# Patient Record
Sex: Female | Born: 1955 | Race: Black or African American | Hispanic: No | Marital: Single | Smoking: Never smoker
Health system: Southern US, Community
[De-identification: ages and names within clinical notes are randomized; demographics above are authoritative.]

## PROBLEM LIST (undated history)

## (undated) DIAGNOSIS — N179 Acute kidney failure, unspecified: Secondary | ICD-10-CM

## (undated) DIAGNOSIS — K2270 Barrett's esophagus without dysplasia: Secondary | ICD-10-CM

## (undated) DIAGNOSIS — E559 Vitamin D deficiency, unspecified: Secondary | ICD-10-CM

## (undated) DIAGNOSIS — K219 Gastro-esophageal reflux disease without esophagitis: Secondary | ICD-10-CM

## (undated) DIAGNOSIS — N200 Calculus of kidney: Secondary | ICD-10-CM

## (undated) DIAGNOSIS — B351 Tinea unguium: Secondary | ICD-10-CM

## (undated) DIAGNOSIS — R7303 Prediabetes: Secondary | ICD-10-CM

## (undated) DIAGNOSIS — G4733 Obstructive sleep apnea (adult) (pediatric): Secondary | ICD-10-CM

## (undated) DIAGNOSIS — K5900 Constipation, unspecified: Secondary | ICD-10-CM

## (undated) DIAGNOSIS — Z972 Presence of dental prosthetic device (complete) (partial): Secondary | ICD-10-CM

## (undated) DIAGNOSIS — Z87442 Personal history of urinary calculi: Secondary | ICD-10-CM

## (undated) DIAGNOSIS — E785 Hyperlipidemia, unspecified: Secondary | ICD-10-CM

## (undated) DIAGNOSIS — K08109 Complete loss of teeth, unspecified cause, unspecified class: Secondary | ICD-10-CM

## (undated) DIAGNOSIS — H409 Unspecified glaucoma: Secondary | ICD-10-CM

## (undated) DIAGNOSIS — M1990 Unspecified osteoarthritis, unspecified site: Secondary | ICD-10-CM

## (undated) DIAGNOSIS — M48061 Spinal stenosis, lumbar region without neurogenic claudication: Secondary | ICD-10-CM

## (undated) DIAGNOSIS — I499 Cardiac arrhythmia, unspecified: Secondary | ICD-10-CM

## (undated) DIAGNOSIS — K760 Fatty (change of) liver, not elsewhere classified: Secondary | ICD-10-CM

## (undated) DIAGNOSIS — F0150 Vascular dementia without behavioral disturbance: Secondary | ICD-10-CM

## (undated) DIAGNOSIS — E039 Hypothyroidism, unspecified: Secondary | ICD-10-CM

## (undated) DIAGNOSIS — F800 Phonological disorder: Secondary | ICD-10-CM

## (undated) DIAGNOSIS — F0280 Dementia in other diseases classified elsewhere without behavioral disturbance: Secondary | ICD-10-CM

## (undated) DIAGNOSIS — R001 Bradycardia, unspecified: Secondary | ICD-10-CM

## (undated) DIAGNOSIS — J45909 Unspecified asthma, uncomplicated: Secondary | ICD-10-CM

## (undated) DIAGNOSIS — J189 Pneumonia, unspecified organism: Secondary | ICD-10-CM

## (undated) DIAGNOSIS — M5030 Other cervical disc degeneration, unspecified cervical region: Secondary | ICD-10-CM

## (undated) DIAGNOSIS — F0390 Unspecified dementia without behavioral disturbance: Secondary | ICD-10-CM

## (undated) DIAGNOSIS — I5030 Unspecified diastolic (congestive) heart failure: Secondary | ICD-10-CM

## (undated) DIAGNOSIS — D696 Thrombocytopenia, unspecified: Secondary | ICD-10-CM

## (undated) DIAGNOSIS — J9690 Respiratory failure, unspecified, unspecified whether with hypoxia or hypercapnia: Secondary | ICD-10-CM

## (undated) DIAGNOSIS — Q909 Down syndrome, unspecified: Secondary | ICD-10-CM

## (undated) DIAGNOSIS — R569 Unspecified convulsions: Secondary | ICD-10-CM

## (undated) HISTORY — DX: Tinea unguium: B35.1

## (undated) HISTORY — PX: HERNIA REPAIR: SHX51

## (undated) HISTORY — DX: Unspecified asthma, uncomplicated: J45.909

---

## 2005-01-23 ENCOUNTER — Ambulatory Visit: Payer: Self-pay | Admitting: Family Medicine

## 2005-03-20 ENCOUNTER — Inpatient Hospital Stay: Payer: Self-pay

## 2005-07-03 ENCOUNTER — Inpatient Hospital Stay: Payer: Self-pay | Admitting: Internal Medicine

## 2006-02-20 ENCOUNTER — Ambulatory Visit: Payer: Self-pay | Admitting: Family Medicine

## 2006-08-22 ENCOUNTER — Other Ambulatory Visit: Payer: Self-pay

## 2006-08-22 ENCOUNTER — Ambulatory Visit: Payer: Self-pay | Admitting: General Surgery

## 2006-08-27 ENCOUNTER — Ambulatory Visit: Payer: Self-pay | Admitting: General Surgery

## 2007-03-12 ENCOUNTER — Ambulatory Visit: Payer: Self-pay | Admitting: Family Medicine

## 2008-02-19 ENCOUNTER — Inpatient Hospital Stay: Payer: Self-pay | Admitting: Internal Medicine

## 2008-05-06 ENCOUNTER — Ambulatory Visit: Payer: Self-pay | Admitting: Family Medicine

## 2009-05-11 ENCOUNTER — Inpatient Hospital Stay: Payer: Self-pay | Admitting: Internal Medicine

## 2009-05-27 ENCOUNTER — Ambulatory Visit: Payer: Self-pay | Admitting: Family Medicine

## 2009-06-10 ENCOUNTER — Ambulatory Visit: Payer: Self-pay | Admitting: Family

## 2009-07-14 ENCOUNTER — Ambulatory Visit: Payer: Self-pay | Admitting: Family

## 2009-10-13 ENCOUNTER — Ambulatory Visit: Payer: Self-pay | Admitting: Family

## 2010-01-27 ENCOUNTER — Ambulatory Visit: Payer: Self-pay | Admitting: Family

## 2010-04-28 ENCOUNTER — Ambulatory Visit: Payer: Self-pay | Admitting: Family

## 2010-06-22 ENCOUNTER — Ambulatory Visit: Payer: Self-pay | Admitting: Family Medicine

## 2011-04-26 ENCOUNTER — Ambulatory Visit: Payer: Self-pay | Admitting: Family Medicine

## 2011-06-07 ENCOUNTER — Ambulatory Visit: Payer: Self-pay | Admitting: Gastroenterology

## 2011-06-09 LAB — PATHOLOGY REPORT

## 2011-08-30 ENCOUNTER — Ambulatory Visit: Payer: Self-pay | Admitting: Family Medicine

## 2011-10-27 ENCOUNTER — Inpatient Hospital Stay: Payer: Self-pay | Admitting: Internal Medicine

## 2011-10-27 LAB — COMPREHENSIVE METABOLIC PANEL
Albumin: 3.2 g/dL — ABNORMAL LOW (ref 3.4–5.0)
Alkaline Phosphatase: 55 U/L (ref 50–136)
BUN: 10 mg/dL (ref 7–18)
Bilirubin,Total: 0.4 mg/dL (ref 0.2–1.0)
Creatinine: 0.93 mg/dL (ref 0.60–1.30)
Glucose: 100 mg/dL — ABNORMAL HIGH (ref 65–99)
Osmolality: 286 (ref 275–301)
SGPT (ALT): 16 U/L (ref 12–78)
Sodium: 144 mmol/L (ref 136–145)

## 2011-10-27 LAB — CBC
HCT: 36.5 % (ref 35.0–47.0)
MCH: 33.4 pg (ref 26.0–34.0)
MCV: 99 fL (ref 80–100)
Platelet: 210 10*3/uL (ref 150–440)
RDW: 16 % — ABNORMAL HIGH (ref 11.5–14.5)
WBC: 8.3 10*3/uL (ref 3.6–11.0)

## 2011-10-27 LAB — TROPONIN I: Troponin-I: <0.02 ng/mL

## 2011-10-27 LAB — CK TOTAL AND CKMB (NOT AT ARMC): CK-MB: <0.5 ng/mL — ABNORMAL LOW (ref 0.5–3.6)

## 2011-10-27 LAB — CK-MB: CK-MB: 0.9 ng/mL (ref 0.5–3.6)

## 2011-10-28 LAB — TSH: Thyroid Stimulating Horm: 0.759 u[IU]/mL

## 2011-10-28 LAB — BASIC METABOLIC PANEL
Anion Gap: 10 (ref 7–16)
BUN: 9 mg/dL (ref 7–18)
Calcium, Total: 8.7 mg/dL (ref 8.5–10.1)
Co2: 31 mmol/L (ref 21–32)
EGFR (African American): >60
EGFR (Non-African Amer.): >60
Glucose: 90 mg/dL (ref 65–99)
Potassium: 3.6 mmol/L (ref 3.5–5.1)

## 2011-10-28 LAB — CBC WITH DIFFERENTIAL/PLATELET
Basophil %: 1.4 %
Eosinophil #: 0 10*3/uL (ref 0.0–0.7)
HCT: 35.9 % (ref 35.0–47.0)
HGB: 11.9 g/dL — ABNORMAL LOW (ref 12.0–16.0)
Lymphocyte #: 1 10*3/uL (ref 1.0–3.6)
Lymphocyte %: 15.6 %
MCH: 33 pg (ref 26.0–34.0)
MCHC: 33.2 g/dL (ref 32.0–36.0)
MCV: 99 fL (ref 80–100)
Monocyte #: 0.3 x10 3/mm (ref 0.2–0.9)
Neutrophil %: 77.7 %
Platelet: 196 10*3/uL (ref 150–440)
RBC: 3.61 10*6/uL — ABNORMAL LOW (ref 3.80–5.20)
RDW: 15.8 % — ABNORMAL HIGH (ref 11.5–14.5)
WBC: 6.1 10*3/uL (ref 3.6–11.0)

## 2011-10-28 LAB — LIPID PANEL
Cholesterol: 152 mg/dL (ref 0–200)
HDL Cholesterol: 38 mg/dL — ABNORMAL LOW (ref 40–60)
Ldl Cholesterol, Calc: 98 mg/dL (ref 0–100)
Triglycerides: 81 mg/dL (ref 0–200)
VLDL Cholesterol, Calc: 16 mg/dL (ref 5–40)

## 2011-10-29 LAB — BASIC METABOLIC PANEL
Anion Gap: 7 (ref 7–16)
Calcium, Total: 8.7 mg/dL (ref 8.5–10.1)
Co2: 31 mmol/L (ref 21–32)
EGFR (African American): >60
EGFR (Non-African Amer.): >60
Glucose: 148 mg/dL — ABNORMAL HIGH (ref 65–99)
Osmolality: 283 (ref 275–301)
Sodium: 140 mmol/L (ref 136–145)

## 2011-10-29 LAB — CBC WITH DIFFERENTIAL/PLATELET
Basophil %: 0.4 %
Eosinophil %: 0 %
HGB: 12.2 g/dL (ref 12.0–16.0)
Lymphocyte #: 0.5 10*3/uL — ABNORMAL LOW (ref 1.0–3.6)
MCHC: 33.4 g/dL (ref 32.0–36.0)
MCV: 99 fL (ref 80–100)
Monocyte #: 0 x10 3/mm — ABNORMAL LOW (ref 0.2–0.9)
Monocyte %: 0.6 %
Platelet: 216 10*3/uL (ref 150–440)
RBC: 3.72 10*6/uL — ABNORMAL LOW (ref 3.80–5.20)
WBC: 6.7 10*3/uL (ref 3.6–11.0)

## 2011-10-29 LAB — TROPONIN I
Troponin-I: <0.02 ng/mL
Troponin-I: <0.02 ng/mL

## 2012-01-19 ENCOUNTER — Ambulatory Visit: Payer: Self-pay | Admitting: Family Medicine

## 2012-05-08 ENCOUNTER — Encounter: Payer: Self-pay | Admitting: Family Medicine

## 2012-05-08 ENCOUNTER — Ambulatory Visit: Payer: Self-pay | Admitting: Family Medicine

## 2012-05-16 ENCOUNTER — Encounter: Payer: Self-pay | Admitting: Family Medicine

## 2012-05-17 ENCOUNTER — Ambulatory Visit: Payer: Self-pay | Admitting: Family Medicine

## 2012-06-16 ENCOUNTER — Encounter: Payer: Self-pay | Admitting: Family Medicine

## 2012-10-05 ENCOUNTER — Encounter: Payer: Self-pay | Admitting: *Deleted

## 2012-10-05 DIAGNOSIS — B351 Tinea unguium: Secondary | ICD-10-CM | POA: Insufficient documentation

## 2012-10-16 ENCOUNTER — Ambulatory Visit: Payer: Self-pay | Admitting: Podiatry

## 2012-11-29 ENCOUNTER — Ambulatory Visit: Payer: Self-pay | Admitting: Family Medicine

## 2012-12-02 ENCOUNTER — Ambulatory Visit: Payer: Medicare Other | Admitting: Podiatry

## 2012-12-04 ENCOUNTER — Ambulatory Visit: Payer: Self-pay | Admitting: Family Medicine

## 2012-12-09 ENCOUNTER — Ambulatory Visit (INDEPENDENT_AMBULATORY_CARE_PROVIDER_SITE_OTHER): Payer: Medicare Other | Admitting: Podiatry

## 2012-12-09 ENCOUNTER — Encounter: Payer: Self-pay | Admitting: Podiatry

## 2012-12-09 VITALS — BP 126/71 | HR 55 | Resp 20 | Ht 63.0 in | Wt 211.0 lb

## 2012-12-09 DIAGNOSIS — M79609 Pain in unspecified limb: Secondary | ICD-10-CM

## 2012-12-09 DIAGNOSIS — B351 Tinea unguium: Secondary | ICD-10-CM

## 2012-12-09 NOTE — Progress Notes (Signed)
Lori Petersen presents today with a chief complaint of painful toenails one through 5 bilateral.  Objective: Pulses remain palpable bilateral lower extremity. Nails are thick yellow dystrophic clinically mycotic and darkened.  Assessment: Pain in limb secondary to onychomycosis 1 through 5 bilateral.  Plan: Debridement of nails 1 through 5 bilateral is cover service secondary to pain.

## 2013-02-22 ENCOUNTER — Observation Stay: Payer: Self-pay | Admitting: Internal Medicine

## 2013-02-22 LAB — CBC WITH DIFFERENTIAL/PLATELET
BASOS ABS: 0.1 10*3/uL (ref 0.0–0.1)
BASOS PCT: 1 %
Eosinophil #: 0.2 10*3/uL (ref 0.0–0.7)
Eosinophil %: 2.5 %
HCT: 40.3 % (ref 35.0–47.0)
HGB: 13.5 g/dL (ref 12.0–16.0)
Lymphocyte #: 1.3 10*3/uL (ref 1.0–3.6)
Lymphocyte %: 18.4 %
MCH: 34.6 pg — AB (ref 26.0–34.0)
MCHC: 33.6 g/dL (ref 32.0–36.0)
MCV: 103 fL — ABNORMAL HIGH (ref 80–100)
Monocyte #: 0.4 x10 3/mm (ref 0.2–0.9)
Monocyte %: 5.8 %
Neutrophil #: 5.1 10*3/uL (ref 1.4–6.5)
Neutrophil %: 72.3 %
PLATELETS: 181 10*3/uL (ref 150–440)
RBC: 3.91 10*6/uL (ref 3.80–5.20)
RDW: 14.2 % (ref 11.5–14.5)
WBC: 7.1 10*3/uL (ref 3.6–11.0)

## 2013-02-22 LAB — BASIC METABOLIC PANEL
Anion Gap: 4 — ABNORMAL LOW (ref 7–16)
BUN: 11 mg/dL (ref 7–18)
CALCIUM: 8.7 mg/dL (ref 8.5–10.1)
CO2: 32 mmol/L (ref 21–32)
Chloride: 105 mmol/L (ref 98–107)
Creatinine: 0.75 mg/dL (ref 0.60–1.30)
EGFR (Non-African Amer.): >60
Glucose: 104 mg/dL — ABNORMAL HIGH (ref 65–99)
OSMOLALITY: 281 (ref 275–301)
POTASSIUM: 3.8 mmol/L (ref 3.5–5.1)
SODIUM: 141 mmol/L (ref 136–145)

## 2013-02-23 LAB — URINALYSIS, COMPLETE
Bilirubin,UR: NEGATIVE
GLUCOSE, UR: NEGATIVE mg/dL (ref 0–75)
KETONE: NEGATIVE
LEUKOCYTE ESTERASE: NEGATIVE
Nitrite: NEGATIVE
Ph: 5 (ref 4.5–8.0)
Protein: NEGATIVE
Specific Gravity: 1.013 (ref 1.003–1.030)

## 2013-03-10 ENCOUNTER — Ambulatory Visit: Payer: Medicare Other | Admitting: Podiatry

## 2013-06-25 ENCOUNTER — Encounter: Payer: Self-pay | Admitting: Podiatry

## 2013-06-25 ENCOUNTER — Ambulatory Visit (INDEPENDENT_AMBULATORY_CARE_PROVIDER_SITE_OTHER): Payer: Medicare Other | Admitting: Podiatry

## 2013-06-25 DIAGNOSIS — B351 Tinea unguium: Secondary | ICD-10-CM

## 2013-06-25 DIAGNOSIS — M79609 Pain in unspecified limb: Secondary | ICD-10-CM

## 2013-06-25 NOTE — Progress Notes (Signed)
She presents today with a chief complaint of painful elongated toenails.  Objective: Nails are thick yellow dystrophic onychomycotic and painful palpation.  Assessment: Pain in limb secondary to onychomycosis 1 through 5 bilateral.  Plan: Debridement of nails in thickness and length secondary to pain. Followup with her as needed.

## 2013-09-24 ENCOUNTER — Ambulatory Visit: Payer: Medicare Other | Admitting: Podiatry

## 2013-11-17 ENCOUNTER — Ambulatory Visit: Payer: Medicare Other | Admitting: Podiatry

## 2013-12-08 ENCOUNTER — Ambulatory Visit (INDEPENDENT_AMBULATORY_CARE_PROVIDER_SITE_OTHER): Payer: Medicare Other | Admitting: Podiatry

## 2013-12-08 ENCOUNTER — Ambulatory Visit: Payer: Self-pay | Admitting: Podiatry

## 2013-12-08 DIAGNOSIS — M79676 Pain in unspecified toe(s): Secondary | ICD-10-CM

## 2013-12-08 DIAGNOSIS — B351 Tinea unguium: Secondary | ICD-10-CM

## 2013-12-08 NOTE — Progress Notes (Signed)
She presents today with a chief complaint of painful elongated toenails.  Objective: Nails are thick yellow dystrophic onychomycotic and painful palpation.  Assessment: Pain in limb secondary to onychomycosis 1 through 5 bilateral.  Plan: Debridement of nails in thickness and length secondary to pain. Followup with her as needed.

## 2014-02-13 ENCOUNTER — Ambulatory Visit: Payer: Self-pay | Admitting: Family Medicine

## 2014-03-11 ENCOUNTER — Ambulatory Visit: Payer: Medicare Other | Admitting: Podiatry

## 2014-03-11 ENCOUNTER — Other Ambulatory Visit: Payer: Medicare Other

## 2014-04-01 ENCOUNTER — Ambulatory Visit: Payer: Medicare Other | Admitting: Podiatry

## 2014-04-20 ENCOUNTER — Encounter: Payer: Self-pay | Admitting: Podiatry

## 2014-04-20 ENCOUNTER — Ambulatory Visit (INDEPENDENT_AMBULATORY_CARE_PROVIDER_SITE_OTHER): Payer: Medicare Other | Admitting: Podiatry

## 2014-04-20 DIAGNOSIS — M79676 Pain in unspecified toe(s): Secondary | ICD-10-CM | POA: Diagnosis not present

## 2014-04-20 DIAGNOSIS — B351 Tinea unguium: Secondary | ICD-10-CM

## 2014-04-20 NOTE — Progress Notes (Signed)
She presents today with a chief complaint of painful elongated toenails.  Objective: Nails are thick yellow dystrophic onychomycotic and painful palpation.  Assessment: Pain in limb secondary to onychomycosis 1 through 5 bilateral.  Plan: Debridement of nails in thickness and length secondary to pain. Followup with her as needed. 

## 2014-05-05 NOTE — H&P (Signed)
PATIENT NAME:  Lori Petersen, Korie E MR#:  [id number] DATE OF BIRTH:  17-Jul-1955  DATE OF ADMISSION:  10/27/2011  PRIMARY CARE PHYSICIAN: [facility], Dr Maryruth HancockSallie Patel.  CHIEF COMPLAINT: Shortness of breath, cough, and wheeze.   HISTORY OF PRESENT ILLNESS: This is a 59 year old female with Down syndrome with history of congestive heart failure, diastolic in the past. She has been having coughing for the past couple of weeks. She went to the doctor on Wednesday, was told that she was okay, and then had started some wheezing. She was given a breathing treatment. She has been taking some Tylenol Cold and Sinus. In the ER she was found to have a pneumonia on chest x-ray and also possible congestive heart failure. Hospitalist services were contacted for further evaluation. The patient is not the best historian secondary to Down syndrome. History obtained from sister at the bedside and from old chart.   PAST MEDICAL HISTORY:  1. Down syndrome. 2. Hypothyroidism. 3. Obesity. 4. Diastolic congestive heart failure. 5. Asthma. 6. Hyperlipidemia 7. Gastroesophageal reflux disease.   PAST SURGICAL HISTORY: None.   ALLERGIES: No known drug allergies.   MEDICATIONS:  1. Albuterol nebulizer p.r.n.  2. Pulmicort nebulizer twice a day.  3. Omeprazole 20 mg daily.  4. Vitamin D 50,000 units weekly.  5. Pravastatin 40 mg at bedtime.  6. Synthroid 112 mcg daily.   SOCIAL HISTORY: Lives with her sister. No smoking. No alcohol.   FAMILY HISTORY: Father died in his 8070s of possible heart failure. Mother died in her 6670s of possible heart attack.  REVIEW OF SYSTEMS: CONSTITUTIONAL: No fever, no chills, no sweats, no weight loss, no weight gain, no weakness. EYES: She does wear glasses. EARS, NOSE, MOUTH, AND THROAT: No hearing loss. No sore throat. No difficulty swallowing. CARDIOVASCULAR: No chest pain. No palpitation. RESPIRATORY: Positive for shortness of breath. Positive for cough. No sputum. No  hemoptysis. GASTROINTESTINAL: Vomited the other day. No abdominal pain. No diarrhea. No constipation. No bright red blood per rectum. No melena. GENITOURINARY: No burning on urination, no hematuria. MUSCULOSKELETAL: No joint pain. INTEGUMENT: Did have a sore on the arm a couple of weeks ago. NEUROLOGIC: No fainting or blackouts. PSYCHIATRIC: No anxiety or depression. ENDOCRINE: Positive for hypothyroidism. HEMATOLOGIC/LYMPHATIC: No anemia.   PHYSICAL EXAMINATION:  VITAL SIGNS: Temperature 99, pulse 70, respirations 24, blood pressure 151/92, pulse oximetry 94% on 2 liters of oxygen.   GENERAL: No respiratory distress.   EYES: Conjunctivae normal, lids normal. Pupils equal, round, and reactive to light. Extraocular muscles intact. No nystagmus.   EARS, NOSE, MOUTH, AND THROAT: Tympanic membranes no erythema. Nasal mucosa no erythema. Throat no erythema. No exudate seen. Lips and gums no lesions.   NECK: Positive for JVD. No bruits. No lymphadenopathy. No thyromegaly. No thyroid nodules palpated.   RESPIRATORY: No use of accessory muscles to breathe. Poor air entry bilaterally.  I am not hearing the wheeze that the ER physician heard. No rales or rhonchi heard. Poor air entry.   CARDIOVASCULAR: S1 and S2 soft. No gallops, rubs, or murmurs heard. Carotid upstroke 2+ bilaterally. No bruits. Dorsalis pedis pulses 2+ bilaterally, 2+ edema bilateral lower extremities.   ABDOMEN: Soft, nontender. No organosplenomegaly. Normoactive bowel sounds. No masses felt.   LYMPHATIC: No lymph nodes in the neck.   MUSCULOSKELETAL: 2+ edema. No clubbing. No cyanosis on oxygen.   SKIN: No ulcers seen.   NEUROLOGIC: Cranial nerves II through XII grossly intact. Deep tendon reflexes 1+ bilateral lower extremities.  PSYCHIATRIC: The patient is alert and oriented to person and place.   LABORATORY AND RADIOLOGICAL DATA: BNP elevated at 1714. CPK 77. White blood cell count 8.3, hemoglobin 12.3 and hematocrit 36.5,  platelet count of 210,000. Glucose 100, BUN 10, creatinine 0.93, sodium 144, potassium 3.8, chloride 106, CO2 of 29, calcium 8.6. Liver function tests normal range except for the albumin low at 3.2. Troponin negative. EKG showed a normal sinus rhythm, 92 beats per minute, left atrial enlargement, flipped T waves in V1 through V6. Chest x-ray looks like a right lower lobe pneumonia with underlying increased peripheral vascular congestion bilaterally.   ASSESSMENT AND PLAN:  1. Acute respiratory failure with pulse oximetry of 94% on 2L. We will check a room air pulse oximetry in the a.m. Continue oxygen supplementation for now.  2. Possibility of congestive heart failure, which will probably diastolic versus pneumonia or a combination of both. We will give IV Rocephin and Zithromax. ER physician already sent off blood cultures. We will give IV Lasix 20 mg IV b.i.d., add low-dose Coreg, and obtain an echocardiogram.  3. Hypothyroidism. Continue levothyroxine. 4. Obesity with a BMI of 46.1. Weight loss is definitely recommended.  5. Hyperlipidemia. Continue pravastatin.  6. Gastroesophageal reflux disease, on omeprazole.  7. Down syndrome. Patient lives with her daughter and does answer yes or no questions.          TIME SPENT ON ADMISSION: 55 minutes.   CODE STATUS: THE PATIENT IS A DO NOT RESUSCITATE.     ____________________________ Herschell Dimes. Renae Gloss, MD rjw:vtd D: 10/27/2011 20:57:57 ET T: 10/28/2011 08:09:40 ET JOB#: [id number]  cc: Herschell Dimes. Renae Gloss, MD, <Dictator> Sarah "Sallie" Allena Katz, MD Salley Scarlet MD ELECTRONICALLY SIGNED 11/03/2011 21:11

## 2014-05-05 NOTE — Discharge Summary (Signed)
PATIENT NAME:  Lori Petersen, Lori Petersen MR#:  [id number] DATE OF BIRTH:  1955/01/19  DATE OF ADMISSION:  10/27/2011 DATE OF DISCHARGE:  10/30/2011  DISCHARGE DIAGNOSES:  1. Acute bronchitis.  2. Acute on chronic congestive heart failure.  3. Acute respiratory failure.  4. Noncompliance.  5. Hypothyroidism.  6. Morbid obesity.  7. Gastroesophageal reflux disease.   CONSULTANTS: None.   LABORATORY, DIAGNOSTIC AND RADIOLOGICAL DATA: Chest x-ray showed bilateral pulmonary edema which is improving on repeat chest x-ray. 2-D echocardiogram which was poor study but with ejection fraction greater than 55%.   ADMITTING HISTORY AND PHYSICAL: Please see detailed history and physical dictated by Dr. Renae GlossWieting. In brief, 59 year old patient with Down's syndrome with history of diastolic congestive heart failure presented to the Emergency Room complaining of cough, wheezing, shortness of breath. Patient's chest x-ray showed bilateral pneumonitis versus congestive heart failure and was admitted to the hospitalist service.   HOSPITAL COURSE:  1. Acute respiratory failure. This is secondary to acute bronchitis with wheezing and acute on chronic diastolic congestive heart failure. Patient was started on IV Lasix along with nebulizers, steroids, and antibiotics, improved well with saturating 96% on 2 liters oxygen on the day of discharge, which is her baseline. She did not have any wheezing on examination.  2. Noncompliance. Her acute on chronic diastolic congestive heart failure exacerbation was secondary to noncompliance with fluid restrictions and salt restriction at home. Patient was tried on beta blocker of Coreg but had bradycardia into the 40s and had to be stopped.   DISCHARGE MEDICATIONS:  1. Lasix 20 mg oral once a day.  2. Levothyroxine 112 mcg oral once a day.  3. Pravastatin 40 mg oral once a day.  4. Albuterol nebulizers 4 to 6 hours as needed.  5. Aspirin 81 mg oral once a day.  6. Omeprazole 20 mg oral  once a day.  7. Vitamin D2 50,000 units oral once a week.  8. Pulmicort 1 neb oral 2 times a day.  9. Levaquin 500 mg oral once a day.  10. Prednisone 60 mg tapered over six days.   DISCHARGE INSTRUCTIONS: Patient has been set up with home health with nurse for congestive heart failure. She needs to be compliant with diet free fluid and salt restriction, both less than 2 liters and 2 grams respectively. Cardiac diet. Activity as tolerated with assistance. She is to return to Emergency Room if she has any worsening shortness of breath or fever.   TIME SPENT: Time spent today on this discharge dictation along with coordinating care and counseling of the patient was 45 minutes.  ____________________________ Molinda BailiffSrikar R. Torre Pikus, MD srs:cms D: 10/30/2011 14:46:23 ET T: 10/31/2011 10:10:21 ET JOB#: [id number]  cc: Wardell HeathSrikar R. Bain Whichard, MD, <Dictator> Orie FishermanSRIKAR R Kameron Glazebrook MD ELECTRONICALLY SIGNED 11/13/2011 13:57

## 2014-05-09 NOTE — H&P (Signed)
PATIENT NAME:  Lori Petersen, Lori Petersen MR#:  [id number] DATE OF BIRTH:  1955-02-26  DATE OF ADMISSION:  02/22/2013  PRIMARY CARE PHYSICIAN: Dr. Hillery AldoSarah Petersen  REFERRING PHYSICIAN: Reita MayEric Petersen, Physician Assistant   CHIEF COMPLAINT: Frequent falls.   HISTORY OF PRESENT ILLNESS:  Ms. Lori Petersen is a 59 year old African-American female with history of Down syndrome, hypothyroidism, diastolic congestive heart failure. She was brought to the Emergency Department after having a fall. The patient has been wobbly for the last 4 months. Has been having frequent falls. Yesterday fell down 2 times. Since then is unable to walk. Yesterday, after the fall, patient's sister was unable to pull her up. Today when the patient's aide came, patient was unable to ambulate. Concerning this, patient is brought to the Emergency Department. X-ray of the left knee showed degenerative changes. No obvious swelling was noted. Lab workup is completely unremarkable. The patient lives with her sister, who is a dialysis patient, who is unable to care for the patient. However, patient's sister does not want her to be placed in a nursing home, but is willing to send her for rehabilitation services.   PAST SURGICAL HISTORY: None.   ALLERGIES: No known drug allergies.   HOME MEDICATIONS: 1.  Vitamin D2, 50,000 units once a week.  2.  Pravastatin 40 mg once a day.   3.  Potassium chloride 20 mEq 2 times a day.  4.  Levothyroxine 112 mcg once a day.  5.  Lasix 20 mg once a day. 6.  Aspirin 81 mg once a day.   SOCIAL HISTORY: No history of smoking, drinking alcohol, or using illicit drugs. Lives with her sister.   FAMILY HISTORY: Sister has end-stage renal disease, on hemodialysis, and diabetes mellitus.   REVIEW OF SYSTEMS: Could not be obtained secondary to patient's baseline underlying mental retardation from the Down syndrome.   PHYSICAL EXAMINATION: GENERAL: This is a well-built, well-nourished, obese female lying down in the bed, not  in distress.  VITAL SIGNS: Temperature 98.3, pulse 64, blood pressure 131/75, respiratory rate of 20, oxygen saturation is 97% on room air.  HEENT: Head normocephalic, atraumatic. No scleral icterus. Conjunctivae normal. Pupils equal and react to light. Mucous membranes dry. No pharyngeal erythema.  NECK: Supple. No lymphadenopathy. No JVD. No carotid bruits. CHEST: No focal tenderness.  LUNGS: Bilaterally clear to auscultation.  HEART: S1, S2 regular. No murmurs are heard. No pedal edema. Pulses 2+.  ABDOMEN: Bowel sounds present. Soft, nontender, nondistended. Could not appreciate any hepatosplenomegaly.  SKIN: No rash or lesions.  NEUROLOGIC: The patient is alert, oriented to person. Could not tell the time and date. Motor 5/5 in upper and lower extremities.   LABS: CBC and CMP are completely within normal limits.   X-ray of the left knee shows degenerative changes.   ASSESSMENT AND PLAN: Ms. Lori Petersen is a 59 year old female with Down syndrome, comes to the Emergency Department with frequent falls in the last 4 months.   1.  Frequent falls. The differential diagnosis possible cerebrovascular accident. However, patient does not show any obvious focal signs. The patient's mucous membranes are dry, the possibility of patient having orthostatic hypotension. Will obtain orthostatic blood pressures. If positive, will give her IV fluids. Will also involve physical therapy and occupational therapy.   2.  Hypertension, currently well-controlled. Continue with home medications.   3.  Diastolic congestive heart failure. Will hold the Lasix, as patient's mucous membranes are dry.   4.  Keep the patient on DVT prophylaxis with  Lovenox.   TIME SPENT: 45 minutes.   ____________________________ Lori Griffins, MD pv:mr D: 02/22/2013 21:55:36 ET T: 02/22/2013 22:32:05 ET JOB#: [id number]  cc: Lori Griffins, MD, <Dictator> Lori "Sallie" Allena Katz, MD  Lori Griffins MD ELECTRONICALLY SIGNED  03/20/2013 1:11

## 2014-05-09 NOTE — Discharge Summary (Signed)
PATIENT NAME:  Lori Petersen, Lori Petersen MR#:  [id number] DATE OF BIRTH:  [birth year]  DATE OF ADMISSION:  02/22/2013 DATE OF DISCHARGE:  02/24/2013  PRESENTING COMPLAINT: Frequent falls at home.   DISCHARGE DIAGNOSES: 1.  Ambulatory dysfunction secondary to dysfunction with falls at home.  2.  Down syndrome.  3.  Hypothyroidism.  4.  Hyperlipidemia.   CONDITION ON DISCHARGE: Fair.   CODE STATUS: FULL CODE.   MEDICATIONS: 1.  Levothyroxine 112 mcg p.o. daily.  2.  Pravastatin 40 mg at bedtime.  3.  Aspirin 81 mg daily.  4.  Vitamin D2, 50,000 International Units p.o. once a week.  5.  Lasix 20 mg daily.  6.  K-Dur 20 mEq p.o. b.i.d.   PHYSICAL THERAPY:  Home PT has been set up.   Wheelchair has been prescribed.   Follow [facility].   CONSULTATIONS: Physical therapy.   UA negative for UTI.   CT OF THE HEAD: Negative for any acute intracranial abnormality.   EKG shows normal sinus rhythm. CBC and basic metabolic panel within normal limits.   Left knee degenerative change without acute osseous finding.   Lori Petersen is a 59 year old [place] female with chronic Down syndrome who lives at home with her sister, comes in after she has had frequent falls in the last 4 months. She was admitted with:  1.  Ambulation dysfunction with frequent falls. She has some mild deformity in her left foot and likely loses balance while walking. No injury or trauma reported. The patient did not meet inpatient criteria, hence, was seen by physical therapy, recommended wheelchair for safety, which will help her to perform her daily activities of living. The patient was prescribed a wheelchair and home health PT has been arranged.  2.  Hypertension, well controlled.  3.  Hypothyroidism. Continue home medications.  4.  Diastolic congestive heart failure, stable. The patient is not clinically in CHF,  5.  History of Down syndrome.   The discharge plan was  discussed with the patient's sister, who is her primary caregiver.   TIME SPENT: 40 minutes.    ____________________________ Wylie HailSona A. Allena KatzPatel, MD sap:dmm D: 02/25/2013 17:15:00 ET T: 02/25/2013 19:24:42 ET JOB#: [id number]  cc: Carnel Stegman A. Allena KatzPatel, MD, <Dictator> Sarah "Lori" Allena KatzPatel, MD Willow OraSONA A Sumiye Hirth MD ELECTRONICALLY SIGNED 02/27/2013 13:25

## 2014-08-10 ENCOUNTER — Ambulatory Visit (INDEPENDENT_AMBULATORY_CARE_PROVIDER_SITE_OTHER): Payer: Medicare Other | Admitting: Podiatry

## 2014-08-10 DIAGNOSIS — M79676 Pain in unspecified toe(s): Secondary | ICD-10-CM

## 2014-08-10 DIAGNOSIS — B351 Tinea unguium: Secondary | ICD-10-CM | POA: Diagnosis not present

## 2014-08-10 NOTE — Progress Notes (Signed)
She presents today with a chief complaint of painful elongated toenails.  Objective: Nails are thick yellow dystrophic onychomycotic and painful palpation.  Assessment: Pain in limb secondary to onychomycosis 1 through 5 bilateral.  Plan: Debridement of nails in thickness and length secondary to pain. Followup with her as needed. 

## 2014-09-14 DIAGNOSIS — F0280 Dementia in other diseases classified elsewhere without behavioral disturbance: Secondary | ICD-10-CM

## 2014-09-14 DIAGNOSIS — G309 Alzheimer's disease, unspecified: Secondary | ICD-10-CM

## 2014-09-14 DIAGNOSIS — F0150 Vascular dementia without behavioral disturbance: Secondary | ICD-10-CM | POA: Insufficient documentation

## 2014-10-14 ENCOUNTER — Encounter: Payer: Self-pay | Admitting: Physical Therapy

## 2014-10-14 ENCOUNTER — Ambulatory Visit: Payer: Medicare Other | Attending: Neurology | Admitting: Physical Therapy

## 2014-10-14 DIAGNOSIS — R296 Repeated falls: Secondary | ICD-10-CM | POA: Insufficient documentation

## 2014-10-14 DIAGNOSIS — R269 Unspecified abnormalities of gait and mobility: Secondary | ICD-10-CM | POA: Insufficient documentation

## 2014-10-14 NOTE — Therapy (Signed)
[facility] MAIN [facility] SERVICES [address] [place], [place], [address] Phone: [phone]   Fax:  [phone]  Physical Therapy Evaluation  Patient Details  Name: Lori Petersen MRN: [id number] Date of Birth: 03-27-1955 Referring Caroleen Stoermer:  Lonell Face, MD  Encounter Date: 10/14/2014      PT End of Session - 10/14/14 1009    Visit Number 1   Number of Visits 17   Date for PT Re-Evaluation 12/09/14   PT Start Time 0900   PT Stop Time 1003   PT Time Calculation (min) 63 min   Equipment Utilized During Treatment Gait belt   Activity Tolerance Patient tolerated treatment well   Behavior During Therapy Flat affect      Past Medical History  Diagnosis Date  . Asthma   . Onychomycosis     Past Surgical History  Procedure Laterality Date  . Hernia repair      There were no vitals filed for this visit.  Visit Diagnosis:  Abnormality of gait - Plan: PT plan of care cert/re-cert  Repeated falls - Plan: PT plan of care cert/re-cert      Subjective Assessment - 10/14/14 0904    Subjective Patient is a 59 year old female with recent diagnosis of dementia and Alzheimer's. Patient's sister is present for history intake. She comes to PT due to unsteadiness of gait. She has had multiple falls over the past year. Her sister states that roughly a year ago the patient fell and she noticed a change in gait with increased "waddle" and that her L foot was turning in. Sister states that she also need to hold onto something while standing due to decreased balance.   Pertinent History Asthma, Dementia, also has down's syndrome   Limitations Walking;Standing   Diagnostic tests CT scan - mild ischemic changes in white matter.    Patient Stated Goals improve balance, improve gait. decrease falls   Currently in Pain? No/denies            [facility] PT Assessment - 10/15/14 0001    Assessment   Medical Diagnosis imbalance    Onset Date/Surgical Date  09/14/14   Hand Dominance Right   Next MD Visit 4 months from now with Dr. Sherryll Burger   Prior Therapy Patient received PT for balance problems 1 year ago, Good results from PT.    Precautions   Precautions Fall   Restrictions   Weight Bearing Restrictions No   Balance Screen   Has the patient fallen in the past 6 months Yes   How many times? 3   Has the patient had a decrease in activity level because of a fear of falling?  Yes   Is the patient reluctant to leave their home because of a fear of falling?  No   Home Nurse, mental health Private residence   Living Arrangements Other relatives  sister    Available Help at Discharge Family   Type of Home House   Home Access Ramped entrance   Home Layout One level   Home Equipment Walker - 2 wheels;Cane - single point;Grab bars - toilet;Grab bars - tub/shower   Prior Function   Level of Independence Needs assistance with ADLs   Vocation On disability   Leisure coloring    Comments Patient has downs syndrome, sister assists with bathing, meal prep, and washing clothes.    Cognition   Overall Cognitive Status History of cognitive impairments - at baseline  Memory Impairment --  Patient unable to recall 3 words after 20 minutes.    Sensation   Light Touch Appears Intact   Coordination   Gross Motor Movements are Fluid and Coordinated Yes   Finger Nose Finger Test within normal limits, but slow due to cognitive impairment.    Posture/Postural Control   Posture Comments slumped posture with decreased lumbar lordosis in sitting. Forward head and increased lumbar lordosis in standing.    AROM   Overall AROM Comments Assessed grossly due to cognitive impairment. Difficulty with hip flexion in R LE.  Slight decrease in bilateral ankle DF L more impaired than R.    Strength   Overall Strength Comments Strength difficult to assess due to cognitive impairments. Major muscle groups at least 4-/5 with functional tasks. including gait, bed  mobility and sit to stand.     Palpation   Palpation comment no tenderness noted or decreased sensation   Bed Mobility   Rolling Right 6: Modified independent (Device/Increase time)   Rolling Left 6: Modified independent (Device/Increase time)   Supine to Sit 5: Supervision   Supine to Sit Details (indicate cue type and reason) Cues for use of UE for increased ease of movement    Sitting - Scoot to Edge of Bed 6: Modified independent (Device/Increase time)   Sit to Supine 6: Modified independent (Device/Increase time)   Scooting to [facility] 5: Supervision   Scooting to [facility] Details (indicate cue type and reason) Cues for increased bridging and use of UE.    Transfers   Comments Patient required definite use of hands for all transfers.    Ambulation/Gait   Gait Comments Trendelenburg bilaterally, with increased lateral trunk sway; absent heel strike on the L LE, increase L ankle inversion with swing, decreased step length and cadence noted bilaterally. Patient was noted to have improved gait mechanics including increased step height and improve step length walking with RW.     Standardized Balance Assessment   Five times sit to stand comments  30 seconds with 1 HHA. (>15 seconds indicates increased fall risk)    10 Meter Walk 0.4 m/s without an assistive device. 0.21m/s with RW.  (<1.0 m/s indicates decreased community ambulation and increased risk of falls.     Berg Balance Test   Sit to Stand Able to stand  independently using hands   Standing Unsupported Able to stand 2 minutes with supervision   Sitting with Back Unsupported but Feet Supported on Floor or Stool Able to sit safely and securely 2 minutes   Stand to Sit Controls descent by using hands   Transfers Able to transfer with verbal cueing and /or supervision   Standing Unsupported with Eyes Closed Able to stand 3 seconds   Standing Ubsupported with Feet Together Needs help to attain position but able to stand for 30 seconds with feet  together   From Standing, Reach Forward with Outstretched Arm Reaches forward but needs supervision   From Standing Position, Pick up Object from Floor Able to pick up shoe, needs supervision   From Standing Position, Turn to Look Behind Over each Shoulder Needs supervision when turning   Turn 360 Degrees Needs close supervision or verbal cueing   Standing Unsupported, Alternately Place Feet on Step/Stool Needs assistance to keep from falling or unable to try   Standing Unsupported, One Foot in Front Loses balance while stepping or standing   Standing on One Leg Unable to try or needs assist to prevent fall  Total Score 24   Berg comment: <36 indicates high fall risk; 100%; Patient should use cane for all mobility if <40)    Timed Up and Go Test   Normal TUG (seconds) 25.4   TUG Comments without assistive device; >14 sec indicates increased risk for falls;        Treatment:   LE strengthening HEP with red tband  Hip flexion x5 BLE  Hib abduction x5 BLE  Knee extension x5 BLE  PT provided moderate verbal instruction for exercise set up and improve ROM and speed of movement to increase strengthening. Moderate response to instruction                     PT Education - 10/14/14 1007    Education provided Yes   Education Details Plan of care. LE strengthening HEP - see patient instructions    Person(s) Educated Patient   Methods Explanation;Demonstration;Tactile cues;Verbal cues   Comprehension Verbalized understanding;Returned demonstration;Verbal cues required             PT Long Term Goals - 10/14/14 1031    PT LONG TERM GOAL #1   Title Patient will be independent with HEP to improve strength and balance and reduce fall risk at home by 12/09/14   Time 8   Period Weeks   Status New   PT LONG TERM GOAL #2   Title  Patient (< 21 years old) will complete five times sit to stand test in < 15 seconds indicating an increased LE strength and improved balance by  12/09/14   Time 8   Period Weeks   Status New   PT LONG TERM GOAL #3   Title Patient will improve Berg balance scale to >46 to indicate significant improvement in balance and decreased fall risk by 12/09/14   Time 8   Period Weeks   Status New   PT LONG TERM GOAL #4   Title Patient will improve gait speed to at least 0.8 m/s to indicate improve functional mobility within the community.    Time 8   Period Weeks   Status New               Plan - 10/14/14 1020    Clinical Impression Statement Patient is a 59 year old female with downs syndrome and recent diagnosis on Dementia. She reports to PT due to increased falls, and imbalance with gait, but does not use an assistive device. Patient demonstrates cognitive deficits that made formal manual muscle testing difficult. Patent demonstrates LE strength at least 4-/5 through functional movement' hip flexion, hip abduction and ankle PF, appear to be reduced as evidence by gait abnormalities. Significant balance deficits were found through standardized outcome measures including the Berg Balance scale, 5x sit to stand, 10 m walk test, and the tug. Patient scores in the high fall risk category for all balance test. PT provided education on the importance of using rolling walker to reduce fall risk and improve safety with gait. PT also educated patient in LE strengthening home exercises with red tband. Based on impairments found at PT evaluation, this patient would benefit from skilled PT to improve balance, improve gait, increase LE strength to reduce fall risk and improve safety at home and within the community.      Pt will benefit from skilled therapeutic intervention in order to improve on the following deficits Abnormal gait;Decreased activity tolerance;Decreased balance;Decreased cognition;Decreased endurance;Decreased knowledge of use of DME;Decreased mobility;Decreased range of motion;Decreased safety  awareness;Decreased strength;Difficulty  walking;Improper body mechanics   Rehab Potential Good   Clinical Impairments Affecting Rehab Potential Positive: good results from prior PT for balance. Negative: Cognitive deficits,    PT Frequency 2x / week   PT Duration 8 weeks   PT Treatment/Interventions ADLs/Self Care Home Management;DME Instruction;Gait training;Stair training;Functional mobility training;Therapeutic activities;Therapeutic exercise;Neuromuscular re-education;Cognitive remediation;Patient/family education;Balance training;Orthotic Fit/Training;Wheelchair mobility training;Manual techniques   PT Next Visit Plan balance training, 6 minute walk.    PT Home Exercise Plan See patient instructions    Consulted and Agree with Plan of Care Patient;Family member/caregiver          G-Codes - November 04, 2014 0903    Functional Assessment Tool Used 5 times sit<>Stand, 10 meter, walk, timed up and go, Berg balance assessment;   Functional Limitation Mobility: Walking and moving around   Mobility: Walking and Moving Around Goal Status [phone]) At least 40 percent but less than 60 percent impaired, limited or restricted   Mobility: Walking and Moving Around Discharge Status [phone]) At least 20 percent but less than 40 percent impaired, limited or restricted       Problem List Patient Active Problem List   Diagnosis Date Noted  . Onychomycosis    Grier Rocher SPT 11-04-14   9:05 AM  This entire session was performed under direct supervision and direction of a licensed therapist. I have personally read, edited and approve of the note as written.  Hopkins,Margaret PT, DPT 11-04-14, 9:05 AM  [facility] MAIN [facility] SERVICES [address] [place], [place], [address] Phone: [phone]   Fax:  [phone]

## 2014-10-14 NOTE — Patient Instructions (Signed)
ABDUCTION: Sitting - Exercise Ball: Resistance Band (Active)   Sit with feet flat. Lift right leg slightly and, against yellow resistance band, draw it out to side. Complete _2__ sets of _10__ repetitions. Perform _2__ sessions per day.  Copyright  VHI. All rights reserved.  FLEXION: Sitting - Resistance Band (Active)   Sit, both feet flat. Against yellow resistance band, lift right knee toward ceiling. Complete _2__ sets of __10_ repetitions. Perform __2_ sessions per day.  http://gtsc.exer.us/21   Copyright  VHI. All rights reserved.  EXTENSION: Sitting - Resistance Band (Active)   Sit with feet flat. Against yellow resistance band, straighten right knee. Complete __2_ sets of _10__ repetitions. Perform _2__ sessions per day.  Copyright  VHI. All rights reserved.

## 2014-10-19 ENCOUNTER — Ambulatory Visit: Payer: Medicare Other | Attending: Neurology | Admitting: Physical Therapy

## 2014-10-19 ENCOUNTER — Encounter: Payer: Self-pay | Admitting: Physical Therapy

## 2014-10-19 DIAGNOSIS — R296 Repeated falls: Secondary | ICD-10-CM | POA: Insufficient documentation

## 2014-10-19 DIAGNOSIS — R531 Weakness: Secondary | ICD-10-CM | POA: Insufficient documentation

## 2014-10-19 DIAGNOSIS — R269 Unspecified abnormalities of gait and mobility: Secondary | ICD-10-CM | POA: Diagnosis present

## 2014-10-19 NOTE — Therapy (Signed)
[facility] MAIN [facility] SERVICES [address] [place], [place], [address] Phone: [phone]   Fax:  [phone]  Physical Therapy Treatment  Patient Details  Name: Lori Petersen: [id number] Date of Birth: 07-Dec-1955 Referring Provider:  Lonell Face, MD  Encounter Date: 10/19/2014      PT End of Session - 10/19/14 0951    Visit Number 2   Number of Visits 17   Date for PT Re-Evaluation 12/09/14   Authorization Type gcode 2   PT Start Time 0850   PT Stop Time 0937   PT Time Calculation (min) 47 min   Equipment Utilized During Treatment Gait belt   Activity Tolerance Patient tolerated treatment well   Behavior During Therapy Flat affect      Past Medical History  Diagnosis Date  . Asthma   . Onychomycosis     Past Surgical History  Procedure Laterality Date  . Hernia repair      There were no vitals filed for this visit.  Visit Diagnosis:  Abnormality of gait  Repeated falls  Weakness      Subjective Assessment - 10/19/14 0858    Subjective Patient states she is doing good. Patient's care giver reports that Home exercises went well over the weekend, but that they lost the theraband for seated exercises.    Pertinent History Asthma, Dementia, also has down's syndrome   Limitations Walking;Standing   Diagnostic tests CT scan - mild ischemic changes in white matter.    Patient Stated Goals improve balance, improve gait. decrease falls   Currently in Pain? No/denies         treatment     nustep level 2, 3 minutes (unbilled)    Seated therex with red tband  hip flexion x12  Hip abduction x12  Knee extension x12   Supine SLR BLE x12 Side lying hip abduction x10 BLE  Side lying L LE clam shells x10   Sit to stand with ball press x10  Standing hip abduction to stepping stone x12 Standing hip flexion to stepping stone x12   PT provided Constant verbal and tactile instruction to keep patient focused and  engaged on task, and proper ROM and for improved positioning to increase strengthening. Patient was noted to have increased difficulty with L LE hip abduction compared to R LE.   Neuromuscular re-ed: Standing on firm surface head turns x30 seconds no HHA  Standing on firm surface head nods x30 seconds no HHA  Balloon tap 0-1 HHA x 1 minute [facility] stacking outside base of support x 8 each UE.   PT provided CGA and constant verbal, visual and tactile instruction for decreased use of UE for support, Increased focus on task, increased weight shift, and decreased compensation with stepping while reaching outside BOS. Patient responded moderately to instruction from PT, but had difficulty reducing UE support with balloon tap exercise                     PT Education - 10/19/14 0950    Education provided Yes   Education Details LE strengtheing, balance training   Person(s) Educated Patient   Methods Explanation;Demonstration;Tactile cues;Verbal cues   Comprehension Verbalized understanding;Returned demonstration;Verbal cues required;Tactile cues required             PT Long Term Goals - 10/14/14 1031    PT LONG TERM GOAL #1   Title Patient will be independent with HEP to improve strength and balance  and reduce fall risk at home by 12/09/14   Time 8   Period Weeks   Status New   PT LONG TERM GOAL #2   Title  Patient (< 72 years old) will complete five times sit to stand test in < 15 seconds indicating an increased LE strength and improved balance by 12/09/14   Time 8   Period Weeks   Status New   PT LONG TERM GOAL #3   Title Patient will improve Berg balance scale to >46 to indicate significant improvement in balance and decreased fall risk by 12/09/14   Time 8   Period Weeks   Status New   PT LONG TERM GOAL #4   Title Patient will improve gait speed to at least 0.8 m/s to indicate improve functional mobility within the community.    Time 8   Period Weeks   Status  New               Plan - 10/19/14 1610    Clinical Impression Statement Patient instructed in LE strengthening and balance training on this day. PT provided CGA for all balance tasks as well as constant verbal and tactile instruction to remain on task, increase weight shifting, and decrease UE support; Patient demonstrated difficulty with decreased UE support with balance exercises. Constant verbal and tactile instruction also provided to remain on task with LE strengthening, and increase ROM to increase strengthening. Increased difficulty noted with LLE movements compared to the R. Continued skilled PT is recommended to increase LE strength and improve balance to increase safety and independence with ADLs.   Pt will benefit from skilled therapeutic intervention in order to improve on the following deficits Abnormal gait;Decreased activity tolerance;Decreased balance;Decreased cognition;Decreased endurance;Decreased knowledge of use of DME;Decreased mobility;Decreased range of motion;Decreased safety awareness;Decreased strength;Difficulty walking;Improper body mechanics   Rehab Potential Good   Clinical Impairments Affecting Rehab Potential Positive: good results from prior PT for balance. Negative: Cognitive deficits,    PT Frequency 2x / week   PT Duration 8 weeks   PT Treatment/Interventions ADLs/Self Care Home Management;DME Instruction;Gait training;Stair training;Functional mobility training;Therapeutic activities;Therapeutic exercise;Neuromuscular re-education;Cognitive remediation;Patient/family education;Balance training;Orthotic Fit/Training;Wheelchair mobility training;Manual techniques   PT Next Visit Plan 6 minute walk, LE strengthening, and balance    PT Home Exercise Plan continue as given    Consulted and Agree with Plan of Care Patient;Family member/caregiver        Problem List Patient Active Problem List   Diagnosis Date Noted  . Onychomycosis    Grier Rocher  SPT 10/19/2014   4:18 PM  This entire session was performed under direct supervision and direction of a licensed therapist . I have personally read, edited and approve of the note as written.  Hopkins,Margaret  PT, DPT 10/19/2014, 4:18 PM  [facility] MAIN [facility] SERVICES [address] [place], [place], [address] Phone: [phone]   Fax:  [phone]

## 2014-10-21 ENCOUNTER — Ambulatory Visit: Payer: Medicare Other | Admitting: Physical Therapy

## 2014-10-21 ENCOUNTER — Encounter: Payer: Self-pay | Admitting: Physical Therapy

## 2014-10-21 DIAGNOSIS — R269 Unspecified abnormalities of gait and mobility: Secondary | ICD-10-CM | POA: Diagnosis not present

## 2014-10-21 DIAGNOSIS — R531 Weakness: Secondary | ICD-10-CM

## 2014-10-21 DIAGNOSIS — R296 Repeated falls: Secondary | ICD-10-CM

## 2014-10-21 NOTE — Therapy (Signed)
[place] [facility] MAIN [facility] SERVICES [address] [place], [place], [address] Phone: [phone]   Fax:  [phone]  Physical Therapy Treatment  Patient Details  Name: Lori Petersen [facility] MRN: [id number] Date of Birth: 1955-12-26 Referring Provider:  Lonell Face, MD  Encounter Date: 10/21/2014      PT End of Session - 10/21/14 1204    Visit Number 3   Number of Visits 17   Date for PT Re-Evaluation 12/09/14   Authorization Type gcode [id number]   PT Start Time 0845   PT Stop Time 0930   PT Time Calculation (min) 45 min   Equipment Utilized During Treatment Gait belt   Activity Tolerance Patient tolerated treatment well   Behavior During Therapy Flat affect      Past Medical History  Diagnosis Date  . Asthma   . Onychomycosis     Past Surgical History  Procedure Laterality Date  . Hernia repair      There were no vitals filed for this visit.  Visit Diagnosis:  Abnormality of gait  Repeated falls  Weakness      Subjective Assessment - 10/21/14 0856    Subjective Patient states that she is doing well. States that she doing her exercises "sometimes" at home    Pertinent History Asthma, Dementia, also has down's syndrome   Limitations Walking;Standing   Diagnostic tests CT scan - mild ischemic changes in white matter.    Patient Stated Goals improve balance, improve gait. decrease falls       Treatment:    Nustep, level 3, 3 minutes, (unbilled)    Standing on Airex: Standing normal BOS x 30 seconds  Lunge stance x15 seconds 1 HHA   Stepping onto blue square no HHA x5 each foot  Lunge step over cane and back x 8 each LE  Step up to 5 inch box x 8 each LE   Toss and catch with narrow BOS. x15   CGA provided to improve safety. Constant verbal and tactile instruction provided by PT to increase participation and maintain focus on task, decreased use of UE, improved use of hip strategy to correct LOB, and increased motion with  stepping tasks. Mild response for balance tasks following instruction from PT.   Seated therex red tband  BLE hip flexion 2x10  BLE Hip abduction 2x10  BLE Knee extension 2x10  BLE Knee flexion 2x10   PT provided moderate verbal instruction for increase ROM and increased eccentric control to improve LE strengthening. Moderate response noted by patient.   Gait training without AD x 2103ft. PT provided moderate verbal and tactile instruction to decrease trunk lean, increase step length, and increase heel strike. Patient demonstrated only mild to no change in gait pattern with instruction from PT.                              PT Education - 10/21/14 1203    Education provided Yes   Education Details LE strengthening, balance training   Person(s) Educated Patient   Methods Explanation;Demonstration;Tactile cues;Verbal cues   Comprehension Verbalized understanding;Returned demonstration;Verbal cues required;Tactile cues required             PT Long Term Goals - 10/14/14 1031    PT LONG TERM GOAL #1   Title Patient will be independent with HEP to improve strength and balance and reduce fall risk at home by 12/09/14   Time 8  Period Weeks   Status New   PT LONG TERM GOAL #2   Title  Patient (< 19 years old) will complete five times sit to stand test in < 15 seconds indicating an increased LE strength and improved balance by 12/09/14   Time 8   Period Weeks   Status New   PT LONG TERM GOAL #3   Title Patient will improve Berg balance scale to >46 to indicate significant improvement in balance and decreased fall risk by 12/09/14   Time 8   Period Weeks   Status New   PT LONG TERM GOAL #4   Title Patient will improve gait speed to at least 0.8 m/s to indicate improve functional mobility within the community.    Time 8   Period Weeks   Status New               Plan - 10/21/14 1204    Clinical Impression Statement Patient instructed LE  strengthening, balance training and gait training. PT provided CGA for all balance and gait activities to increase patient safety. Constant verbal and tactile instruction provided with balance training to maintain focus on task, improve participation, decrease UE support and to increase weight shift with pelvis to decrease LOB. Moderate verbal instruction provided to improve ROM and increase eccentric control with LE strengthening. No change in gait pattern with instruction from PT  to decrease trunk lean and increase step length following instruction. Continued skilled PT is recommended to improve gait and balance to reduce fall risk and improve safety within the community.   Pt will benefit from skilled therapeutic intervention in order to improve on the following deficits Abnormal gait;Decreased activity tolerance;Decreased balance;Decreased cognition;Decreased endurance;Decreased knowledge of use of DME;Decreased mobility;Decreased range of motion;Decreased safety awareness;Decreased strength;Difficulty walking;Improper body mechanics   Rehab Potential Good   Clinical Impairments Affecting Rehab Potential Positive: good results from prior PT for balance. Negative: Cognitive deficits,    PT Frequency 2x / week   PT Duration 8 weeks   PT Treatment/Interventions ADLs/Self Care Home Management;DME Instruction;Gait training;Stair training;Functional mobility training;Therapeutic activities;Therapeutic exercise;Neuromuscular re-education;Cognitive remediation;Patient/family education;Balance training;Orthotic Fit/Training;Wheelchair mobility training;Manual techniques   PT Next Visit Plan balance, LE strengthening.    PT Home Exercise Plan continue as given    Consulted and Agree with Plan of Care Patient;Family member/caregiver        Problem List Patient Active Problem List   Diagnosis Date Noted  . Onychomycosis    Grier Rocher SPT 10/21/2014   3:17 PM  This entire session was performed  under direct supervision and direction of a licensed therapist . I have personally read, edited and approve of the note as written.  Hopkins,Margaret PT, DPT 10/21/2014, 3:17 PM  [place] [facility] MAIN [facility] SERVICES [address] [place], [place], [address] Phone: [phone]   Fax:  [phone]

## 2014-10-26 ENCOUNTER — Ambulatory Visit: Payer: Medicare Other | Admitting: Physical Therapy

## 2014-10-26 ENCOUNTER — Encounter: Payer: Self-pay | Admitting: Physical Therapy

## 2014-10-26 DIAGNOSIS — R296 Repeated falls: Secondary | ICD-10-CM

## 2014-10-26 DIAGNOSIS — R269 Unspecified abnormalities of gait and mobility: Secondary | ICD-10-CM | POA: Diagnosis not present

## 2014-10-26 DIAGNOSIS — R531 Weakness: Secondary | ICD-10-CM

## 2014-10-26 NOTE — Therapy (Signed)
[place] [facility] MAIN [facility] SERVICES [address] [place], [place], [address] Phone: [phone]   Fax:  [phone]  Physical Therapy Treatment  Patient Details  Name: Lori Petersen [facility] MRN: [id number] Date of Birth: [birth year] Referring Provider:  Lonell Face, MD  Encounter Date: 10/26/2014      PT End of Session - 10/26/14 0855    Visit Number 4   Number of Visits 17   Date for PT Re-Evaluation 12/09/14   Authorization Type gcode 4   PT Start Time 0850   PT Stop Time 0930   PT Time Calculation (min) 40 min   Equipment Utilized During Treatment Gait belt   Activity Tolerance Patient tolerated treatment well   Behavior During Therapy Flat affect      Past Medical History  Diagnosis Date  . Asthma   . Onychomycosis     Past Surgical History  Procedure Laterality Date  . Hernia repair      There were no vitals filed for this visit.  Visit Diagnosis:  Abnormality of gait  Repeated falls  Weakness      Subjective Assessment - 10/26/14 0853    Subjective Patient states that she is doing well upon arrival to PT. She reports no new falls since the start of PT, and she has not increased her use of any AD with ambulation in the community or at PT.    Pertinent History Asthma, Dementia, also has down's syndrome   Limitations Walking;Standing   Diagnostic tests CT scan - mild ischemic changes in white matter.    Patient Stated Goals improve balance, improve gait. decrease falls   Currently in Pain? No/denies         nustep 2 minutes, level 2 (unbilled)   Therex.  Leg press on quantum. 3x12, 60#  Supine: Bridges 2x 10 SLR 2x10  Hip abduction  2x 10 red tband   Side step with red tband at knees x 3 laps at //bars   PT provided moderate to constant verbal instruction for increased ROM, decreased Speed of movement, improved positioning of the LE to increase strengthening, and cues to remain on task. Patient responded mildly  to instruction   Neuromuscular re-ed  Sit to stand holding ball 2x10  Stepping over 2, 1 inch canes x 4 laps  Standing on airex : Raise/lower ball x 10  Lateral reaches with BUE x 10 each direction   PT provided CGA-min A to increase patient safety and prevent lateral LOB. Constant verbal instruction and tactile cues provided by PT to decrease UE use, increase ROM, increase use of hip strategy to prevent lateral LOB. Cues also provided to increase step length improve weight shift over the L LE. Patient demonstrated only very minimal response to instruction from PT  PT also provided education to patient and patient's sister on the importance of use of RW with gait within the community, given that she was very unsteady and nervous with stepping over small objects.                         PT Education - 10/26/14 0854    Education provided Yes   Education Details balance training, LE strengthening, increased use of RW for gait within the community.    Person(s) Educated Patient   Methods Explanation;Demonstration;Tactile cues;Verbal cues   Comprehension Verbalized understanding;Returned demonstration;Verbal cues required;Tactile cues required  PT Long Term Goals - 10/14/14 1031    PT LONG TERM GOAL #1   Title Patient will be independent with HEP to improve strength and balance and reduce fall risk at home by 12/09/14   Time 8   Period Weeks   Status New   PT LONG TERM GOAL #2   Title  Patient (< 31 years old) will complete five times sit to stand test in < 15 seconds indicating an increased LE strength and improved balance by 12/09/14   Time 8   Period Weeks   Status New   PT LONG TERM GOAL #3   Title Patient will improve Berg balance scale to >46 to indicate significant improvement in balance and decreased fall risk by 12/09/14   Time 8   Period Weeks   Status New   PT LONG TERM GOAL #4   Title Patient will improve gait speed to at least 0.8  m/s to indicate improve functional mobility within the community.    Time 8   Period Weeks   Status New               Plan - 10/26/14 1111    Clinical Impression Statement Patient instructed in LE strengthening as well as static and dynamic balance training. PT provided constant instruction for improved exercise technique and to remain on task with balance and strengthening exercises. Patient demonstrated anxiety and unsteadiness during dynamic balance training and required constant verbal and tactile cueing to prevent UE support to increase difficulty of task. Patient responded minimally to all instruction for corrected exercise technique and required up to Min A prevent LOB with balance training. Continued skilled PT is recommended to improve gait, increase LE strength, and improve balance to reduce fall risk and improve safety.   Pt will benefit from skilled therapeutic intervention in order to improve on the following deficits Abnormal gait;Decreased activity tolerance;Decreased balance;Decreased cognition;Decreased endurance;Decreased knowledge of use of DME;Decreased mobility;Decreased range of motion;Decreased safety awareness;Decreased strength;Difficulty walking;Improper body mechanics   Rehab Potential Good   Clinical Impairments Affecting Rehab Potential Positive: good results from prior PT for balance. Negative: Cognitive deficits,    PT Frequency 2x / week   PT Duration 8 weeks   PT Treatment/Interventions ADLs/Self Care Home Management;DME Instruction;Gait training;Stair training;Functional mobility training;Therapeutic activities;Therapeutic exercise;Neuromuscular re-education;Cognitive remediation;Patient/family education;Balance training;Orthotic Fit/Training;Wheelchair mobility training;Manual techniques   PT Next Visit Plan dynamic balance and strengthening.    PT Home Exercise Plan continue as given    Consulted and Agree with Plan of Care Patient;Family member/caregiver         Problem List Patient Active Problem List   Diagnosis Date Noted  . Onychomycosis    Grier Rocher SPT 10/26/2014   4:51 PM   This entire session was performed under direct supervision and direction of a licensed therapist . I have personally read, edited and approve of the note as written.  Hopkins,Margaret PT, DPT 10/26/2014, 4:51 PM  [place] [facility] MAIN [facility] SERVICES [address] [place], [place], [address] Phone: [phone]   Fax:  [phone]

## 2014-10-28 ENCOUNTER — Ambulatory Visit: Payer: Medicare Other | Admitting: Physical Therapy

## 2014-10-28 ENCOUNTER — Encounter: Payer: Self-pay | Admitting: Physical Therapy

## 2014-10-28 DIAGNOSIS — R531 Weakness: Secondary | ICD-10-CM

## 2014-10-28 DIAGNOSIS — R269 Unspecified abnormalities of gait and mobility: Secondary | ICD-10-CM

## 2014-10-28 DIAGNOSIS — R296 Repeated falls: Secondary | ICD-10-CM

## 2014-10-28 NOTE — Therapy (Signed)
[place] [facility] MAIN [facility] SERVICES [address] [place], [place], [address] Phone: [phone]   Fax:  [phone]  Physical Therapy Treatment  Patient Details  Name: Lori Petersen [facility] MRN: [id number] Date of Birth: 08-07-1955 Referring Provider:  Lonell Face, MD  Encounter Date: 10/28/2014      PT End of Session - 10/28/14 0840    Visit Number 5   Number of Visits 17   Date for PT Re-Evaluation 12/09/14   Authorization Type gcode 5   Authorization Time Period 10   PT Start Time [phone]   PT Stop Time 0930   PT Time Calculation (min) 56 min   Equipment Utilized During Treatment Gait belt   Activity Tolerance Patient tolerated treatment well   Behavior During Therapy Flat affect      Past Medical History  Diagnosis Date  . Asthma   . Onychomycosis     Past Surgical History  Procedure Laterality Date  . Hernia repair      There were no vitals filed for this visit.  Visit Diagnosis:  Abnormality of gait  Repeated falls  Weakness      Subjective Assessment - 10/28/14 0838    Subjective Patient's sister is present for PT on this day; She states that the patient seems to be moving a little better in the commnunity and is getting into and out of stores with greater ease.    Pertinent History Asthma, Dementia, also has down's syndrome   Limitations Walking;Standing   Diagnostic tests CT scan - mild ischemic changes in white matter.    Patient Stated Goals improve balance, improve gait. decrease falls   Currently in Pain? No/denies         nustep level 3, 3 minutes   Supine therex   Bridge 2x10  LE marches 2x 10  Hip abduction x 10  Sidelying clam shells x 10 each LE  Ankle pumps x 15 BLE  L ankle dorsiflexion x 10 yellow tband   PT provided constant verbal and tactile instruction to increase exercise participation, improve ROM, and decrease compensation of the trunk. Patient responded well to verbal instruction, and  demonstrated improved exercise technique compared to prior PT treatment.  Balance training  Standing on airex  Lateral reach for ball and pass with trunk rotation x 12 each direction. 1 HHA   Toe taps on 4 inch step x 10 each LE 1 HHA  Side stepping with red tband at knees x 2 laps in //bars BUE support   Sit to stand holding ball 2x 12 no HHA  Stepping over 2 canes x 2 laps. 1 HHA    PT provided CGA for all balance training exercises to improve safety. Constant verbal and tactile instruction provided to reduce UE support, increase weight shifting, and improve use of ankle strategy to decrease posterior LOB. Patient demonstrated reduced UE support with stepping toe tapping tasks with increased success.                          PT Education - 10/28/14 0839    Education provided Yes   Education Details LE strengthening, balance training.    Person(s) Educated Patient   Methods Explanation;Demonstration;Tactile cues;Verbal cues   Comprehension Verbalized understanding;Returned demonstration;Verbal cues required;Tactile cues required             PT Long Term Goals - 10/14/14 1031    PT LONG TERM GOAL #1  Title Patient will be independent with HEP to improve strength and balance and reduce fall risk at home by 12/09/14   Time 8   Period Weeks   Status New   PT LONG TERM GOAL #2   Title  Patient (< 70100 years old) will complete five times sit to stand test in < 15 seconds indicating an increased LE strength and improved balance by 12/09/14   Time 8   Period Weeks   Status New   PT LONG TERM GOAL #3   Title Patient will improve Berg balance scale to >46 to indicate significant improvement in balance and decreased fall risk by 12/09/14   Time 8   Period Weeks   Status New   PT LONG TERM GOAL #4   Title Patient will improve gait speed to at least 0.8 m/s to indicate improve functional mobility within the community.    Time 8   Period Weeks   Status New                Plan - 10/28/14 0954    Clinical Impression Statement Patient instructed in LE strengthening as well as balance training. PT provided constant verbal and tactile instruction to improve exercise participation, improve ROM and decrease compensation of trunk to improve strengthening. Constant verbal and tactile instruction also provided to decrease use of UE support with balance training as well as improve weight shift and increase use of ankle strategy to prevent lateral LOB. Patient responded moderately to cues to improve ankle stratgey to improve weight shift. Improved stepping ability with decreased UE support with obstacle negotiation. Continued skilled PT is recommended to improvea balance, gait, strength to decrease fall risk and improve safety in the community.    Pt will benefit from skilled therapeutic intervention in order to improve on the following deficits Abnormal gait;Decreased activity tolerance;Decreased balance;Decreased cognition;Decreased endurance;Decreased knowledge of use of DME;Decreased mobility;Decreased range of motion;Decreased safety awareness;Decreased strength;Difficulty walking;Improper body mechanics   Rehab Potential Good   Clinical Impairments Affecting Rehab Potential Positive: good results from prior PT for balance. Negative: Cognitive deficits,    PT Frequency 2x / week   PT Duration 8 weeks   PT Treatment/Interventions ADLs/Self Care Home Management;DME Instruction;Gait training;Stair training;Functional mobility training;Therapeutic activities;Therapeutic exercise;Neuromuscular re-education;Cognitive remediation;Patient/family education;Balance training;Orthotic Fit/Training;Wheelchair mobility training;Manual techniques   PT Next Visit Plan increased balance and gait training   PT Home Exercise Plan continue as given    Consulted and Agree with Plan of Care Patient;Family member/caregiver        Problem List Patient Active Problem List    Diagnosis Date Noted  . Onychomycosis    Grier Rocherustin Jilliann Subramanian SPT 10/28/2014   2:45 PM  This entire session was performed under direct supervision and direction of a licensed therapist . I have personally read, edited and approve of the note as written.  Hopkins,Margaret PT, DPT 10/28/2014, 2:45 PM  [place] [facility] MAIN [facility] SERVICES [address] [place], [place], [id number] Phone: [phone]   Fax:  [phone]

## 2014-11-04 ENCOUNTER — Ambulatory Visit: Payer: Medicare Other

## 2014-11-04 DIAGNOSIS — R269 Unspecified abnormalities of gait and mobility: Secondary | ICD-10-CM

## 2014-11-04 DIAGNOSIS — R531 Weakness: Secondary | ICD-10-CM

## 2014-11-04 DIAGNOSIS — R296 Repeated falls: Secondary | ICD-10-CM

## 2014-11-04 NOTE — Therapy (Signed)
[facility] [address] [place], [place], [address] Phone: [phone]   Fax:  [phone]  Physical Therapy Treatment  Patient Details  [facility] MR[facility]: [id number] Date of Birth: 10/17/1955 No Data Recorded  Encounter Date: 11/04/2014      PT End of Session - 11/04/14 0842    Visit Number 6   Number of Visits 17   Date for PT Re-Evaluation 12/09/14   Authorization Type gcode 6   Authorization Time Period 10   PT Start Time 0845   PT Stop Time 0930   PT Time Calculation (min) 45 min   Equipment Utilized During Treatment Gait belt   Activity Tolerance Patient tolerated treatment well   Behavior During Therapy Flat affect      Past Medical History  Diagnosis Date  . Asthma   . Onychomycosis     Past Surgical History  Procedure Laterality Date  . Hernia repair      There were no vitals filed for this visit.  Visit Diagnosis:  Abnormality of gait  Repeated falls  Weakness      Subjective Assessment - 11/04/14 1645    Subjective Pt reports she is doing well and looking forward to exercising today.  pt denies any pain currently    Pertinent History Asthma, Dementia, also has down's syndrome   Limitations Walking;Standing   Diagnostic tests CT scan - mild ischemic changes in white matter.    Patient Stated Goals improve balance, improve gait. decrease falls   Currently in Pain? No/denies      There ex: nustep level 3, 3 minutes  Sit to stand 2x5, pt requires verbal cueing to fully stand up Sit to stand with yellow medicine ball 2x5, pt responds well to tactile tile cueing on ball to perform exercise with correct technique Heel raises in //bars 2x10 with UE assist Side stepping with red band above knees x 4  laps outside //bars with HHA  Patient responded well to visual cueing and performing exercise concurrently with SPT, pt also responded well to counting out loud to stay on task  Neuro   re-ed Toe taps on 4 inch step x 10 each LE 1 HHA   Stepping over and back 2x10 each LE with 1 HHA Side stepping and back 2x10 each LE with 1 HHA  SPT provided CGA for all balance training exercises for safety. Constant verbal,visual and tactile instruction provided to reduce UE support, increase weight shifting to appropriate LE.                             PT Education - 11/04/14 1645    Education provided Yes   Education Details plan of care, LE strengthening and balance training   Person(s) Educated Patient   Methods Explanation   Comprehension Verbalized understanding             PT Long Term Goals - 10/14/14 1031    PT LONG TERM GOAL #1   Title Patient will be independent with HEP to improve strength and balance and reduce fall risk at home by 12/09/14   Time 8   Period Weeks   Status New   PT LONG TERM GOAL #2   Title  Patient (< 58 years old) will complete five times sit to stand test in < 15 seconds indicating an increased LE strength and improved balance by 12/09/14   Time 8  Period Weeks   Status New   PT LONG TERM GOAL #3   Title Patient will improve Berg balance scale to >46 to indicate significant improvement in balance and decreased fall risk by 12/09/14   Time 8   Period Weeks   Status New   PT LONG TERM GOAL #4   Title Patient will improve gait speed to at least 0.8 m/s to indicate improve functional mobility within the community.    Time 8   Period Weeks   Status New               Plan - 11/04/14 1702    Clinical Impression Statement Pt responded well to visual cueing and performing exercises concurrently with SPT with improved weight shifting, pace and decreasing posterior lean during single leg stance.  Pt required short rest breaks between sets.     Pt will benefit from skilled therapeutic intervention in order to improve on the following deficits Abnormal gait;Decreased activity tolerance;Decreased balance;Decreased  cognition;Decreased endurance;Decreased knowledge of use of DME;Decreased mobility;Decreased range of motion;Decreased safety awareness;Decreased strength;Difficulty walking;Improper body mechanics   Rehab Potential Good   Clinical Impairments Affecting Rehab Potential Positive: good results from prior PT for balance. Negative: Cognitive deficits,    PT Frequency 2x / week   PT Duration 8 weeks   PT Treatment/Interventions ADLs/Self Care Home Management;DME Instruction;Gait training;Stair training;Functional mobility training;Therapeutic activities;Therapeutic exercise;Neuromuscular re-education;Cognitive remediation;Patient/family education;Balance training;Orthotic Fit/Training;Wheelchair mobility training;Manual techniques   PT Next Visit Plan increased balance and gait training   PT Home Exercise Plan continue as given    Consulted and Agree with Plan of Care Patient;Family member/caregiver        Problem List Patient Active Problem List   Diagnosis Date Noted  . Onychomycosis    Janus Molder, SPT This entire session was performed under direct supervision and direction of a licensed therapist/therapist assistant . I have personally read, edited and approve of the note as written. Carlyon Shadow. Tortorici, PT, DPT [phone]  Tortorici,Ashley 11/05/2014, 8:08 AM  [facility] MAIN [facility] SERVICES [address] [place], [place], [address] Phone: [phone]   Fax:  [phone]  Name: [facility] MRN: [id number] Date of Birth: 08-Jul-1955

## 2014-11-09 ENCOUNTER — Ambulatory Visit: Payer: Medicare Other | Admitting: Physical Therapy

## 2014-11-11 ENCOUNTER — Ambulatory Visit: Payer: Medicare Other | Admitting: Physical Therapy

## 2014-11-11 ENCOUNTER — Encounter: Payer: Self-pay | Admitting: Physical Therapy

## 2014-11-11 DIAGNOSIS — R531 Weakness: Secondary | ICD-10-CM

## 2014-11-11 DIAGNOSIS — R269 Unspecified abnormalities of gait and mobility: Secondary | ICD-10-CM | POA: Diagnosis not present

## 2014-11-11 DIAGNOSIS — R296 Repeated falls: Secondary | ICD-10-CM

## 2014-11-11 NOTE — Therapy (Signed)
[facility] MAIN [facility] SERVICES [address] [place], [place], [address] Phone: [phone]   Fax:  [phone]  Physical Therapy Treatment  Patient Details  Name: [facility] MRN: [id number] Date of Birth: Sep 03, 1955 No Data Recorded  Encounter Date: 11/11/2014      PT End of Session - 11/11/14 0931    Visit Number 7   Number of Visits 17   Date for PT Re-Evaluation 12/09/14   Authorization Type gcode 7   Authorization Time Period 10   PT Start Time 0835   PT Stop Time 0930   PT Time Calculation (min) 55 min   Equipment Utilized During Treatment Gait belt   Activity Tolerance Patient tolerated treatment well;Patient limited by fatigue   Behavior During Therapy Flat affect      Past Medical History  Diagnosis Date  . Asthma   . Onychomycosis     Past Surgical History  Procedure Laterality Date  . Hernia repair      There were no vitals filed for this visit.  Visit Diagnosis:  Abnormality of gait  Weakness  Repeated falls      Subjective Assessment - 11/11/14 0846    Subjective Patient reports that she is doing well. She states she had a good weekend and reports that has nod no new falls since last PT visit. denies any pain.    Pertinent History Asthma, Dementia, also has down's syndrome   Limitations Walking;Standing   Diagnostic tests CT scan - mild ischemic changes in white matter.    Patient Stated Goals improve balance, improve gait. decrease falls   Currently in Pain? No/denies      Treatment :    nustep 2 minutes, level 2 (unbilled)   Therex.  Leg press on quantum. x12, 90#, x10, 105#  Hooklying: Bridges x 12 SLR x12 bilaterally Hip abduction x 12 red tband bilaterally;  PT provided moderate to constant verbal instruction for increased ROM, decreased Speed of movement, improved positioning of the LE to increase strengthening, and cues to remain on task. Patient responded mildly to  instruction  Neuromuscular re-ed  Sit to stand holding ball 2x10  Foot taps on 4 inch step x 8 BLE  Side step over cane and back x 8 BLE  Stepping over cane and back x 8 BLE  Weave through 7 cones x 2. Side stepping through cones.    PT provided CGA and 1 HHA for all balance activities. Patient demonstrated difficulty reducing dependence on PT HHA for stability with stepping tasks. Constant verbal instruction provided to improve posture, improve step length, improve foot clearance, decrease UE assistance. PT noted only minimal change in posture as well as step length/height and no change in UE support.                           PT Education - 11/11/14 0931    Education provided Yes   Education Details LE strengthening, balance   Person(s) Educated Patient   Methods Explanation;Demonstration;Tactile cues;Verbal cues   Comprehension Verbalized understanding;Returned demonstration;Verbal cues required;Tactile cues required             PT Long Term Goals - 10/14/14 1031    PT LONG TERM GOAL #1   Title Patient will be independent with HEP to improve strength and balance and reduce fall risk at home by 12/09/14   Time 8   Period Weeks   Status New  PT LONG TERM GOAL #2   Title  Patient (< 55 years old) will complete five times sit to stand test in < 15 seconds indicating an increased LE strength and improved balance by 12/09/14   Time 8   Period Weeks   Status New   PT LONG TERM GOAL #3   Title Patient will improve Berg balance scale to >46 to indicate significant improvement in balance and decreased fall risk by 12/09/14   Time 8   Period Weeks   Status New   PT LONG TERM GOAL #4   Title Patient will improve gait speed to at least 0.8 m/s to indicate improve functional mobility within the community.    Time 8   Period Weeks   Status New               Plan - 11/11/14 0932    Clinical Impression Statement Patient instructed in balance and  LE strengthening. PT was required to provide constant verbal, visual, and tactile instruction to improve exercise technique and increase participation. Patient continues to report that she does not and will not use an assistive device at home, but required HHA for most dynamic balance tasks. Patient demonstrated mild fatigue throughout today's session requiring multiple seated rest breaks. Continued skilled PT is recommended to improve balance, LE strength, and improve safety with gait to allow patient to access home and community with greater ease and decreased fall risk.   Pt will benefit from skilled therapeutic intervention in order to improve on the following deficits Abnormal gait;Decreased activity tolerance;Decreased balance;Decreased cognition;Decreased endurance;Decreased knowledge of use of DME;Decreased mobility;Decreased range of motion;Decreased safety awareness;Decreased strength;Difficulty walking;Improper body mechanics   Rehab Potential Good   Clinical Impairments Affecting Rehab Potential Positive: good results from prior PT for balance. Negative: Cognitive deficits,    PT Frequency 2x / week   PT Duration 8 weeks   PT Treatment/Interventions ADLs/Self Care Home Management;DME Instruction;Gait training;Stair training;Functional mobility training;Therapeutic activities;Therapeutic exercise;Neuromuscular re-education;Cognitive remediation;Patient/family education;Balance training;Orthotic Fit/Training;Wheelchair mobility training;Manual techniques   PT Next Visit Plan Check goals. advance home exercises.    PT Home Exercise Plan continue as given    Consulted and Agree with Plan of Care Patient        Problem List Patient Active Problem List   Diagnosis Date Noted  . Onychomycosis    Grier Rocher SPT 11/12/2014   10:09 AM  This entire session was performed under direct supervision and direction of a licensed therapist . I have personally read, edited and approve of the note  as written.  Hopkins,Margaret PT, DPT 11/12/2014, 10:09 AM  [facility] MAIN [facility] SERVICES [address] [place], [place], [address] Phone: [phone]   Fax:  [phone]  Name: Rainelle Sulewski [facility] MRN: [id number] Date of Birth: [birth year]

## 2014-11-12 ENCOUNTER — Ambulatory Visit (INDEPENDENT_AMBULATORY_CARE_PROVIDER_SITE_OTHER): Payer: Medicare Other | Admitting: Podiatry

## 2014-11-12 DIAGNOSIS — B351 Tinea unguium: Secondary | ICD-10-CM

## 2014-11-12 DIAGNOSIS — M79676 Pain in unspecified toe(s): Secondary | ICD-10-CM | POA: Diagnosis not present

## 2014-11-12 NOTE — Progress Notes (Signed)
Subjective: 59 y.o. returns the office today for painful, elongated, thickened toenails which she cannot trim herself. Denies any redness or drainage around the nails. Denies any acute changes since last appointment and no new complaints today. Denies any systemic complaints such as fevers, chills, nausea, vomiting.   Objective: AAO 3, NAD DP/PT pulses palpable, CRT less than 3 seconds Nails hypertrophic, dystrophic, elongated, brittle, discolored 10. There is tenderness overlying the nails 1-5 bilaterally. There is no surrounding erythema or drainage along the nail sites. No open lesions or pre-ulcerative lesions are identified. No other areas of tenderness bilateral lower extremities. No overlying edema, erythema, increased warmth. No pain with calf compression, swelling, warmth, erythema.  Assessment: Patient presents with symptomatic onychomycosis  Plan: -Treatment options including alternatives, risks, complications were discussed -Nails sharply debrided 10 without complication/bleeding. -Discussed daily foot inspection. If there are any changes, to call the office immediately.  -Follow-up in 3 months or sooner if any problems are to arise. In the meantime, encouraged to call the office with any questions, concerns, changes symptoms.  Ovid CurdMatthew Yaretzy Olazabal, DPM

## 2014-11-16 ENCOUNTER — Ambulatory Visit: Payer: Medicare Other | Admitting: Physical Therapy

## 2014-11-18 ENCOUNTER — Encounter: Payer: Self-pay | Admitting: Physical Therapy

## 2014-11-18 ENCOUNTER — Ambulatory Visit: Payer: Medicare Other | Attending: Neurology | Admitting: Physical Therapy

## 2014-11-18 DIAGNOSIS — R296 Repeated falls: Secondary | ICD-10-CM | POA: Insufficient documentation

## 2014-11-18 DIAGNOSIS — R531 Weakness: Secondary | ICD-10-CM | POA: Diagnosis present

## 2014-11-18 DIAGNOSIS — R269 Unspecified abnormalities of gait and mobility: Secondary | ICD-10-CM | POA: Insufficient documentation

## 2014-11-18 NOTE — Therapy (Signed)
[place] MAIN [facility] SERVICES [address] [place], [place], [address] Phone: [phone]   Fax:  [phone]  Physical Therapy Treatment/Progress note  10/14/14/ - 11/18/14   Patient Details  Name: Lori Petersen [facility] MRN: [id number] Date of Birth: [birth year] Referring Provider: Hemang K. Manuella Ghazi  Encounter Date: 11/18/2014      PT End of Session - 11/18/14 0908    Visit Number 8   Number of Visits 17   Date for PT Re-Evaluation 12/09/14   Authorization Type gcode 1   Authorization Time Period 10   PT Start Time 0850   PT Stop Time 0930   PT Time Calculation (min) 40 min   Equipment Utilized During Treatment Gait belt   Activity Tolerance Patient tolerated treatment well   Behavior During Therapy Flat affect      Past Medical History  Diagnosis Date  . Asthma   . Onychomycosis     Past Surgical History  Procedure Laterality Date  . Hernia repair      There were no vitals filed for this visit.  Visit Diagnosis:  Abnormality of gait  Weakness  Repeated falls      Subjective Assessment - 11/18/14 0909    Subjective Patient reports that she is doing well upon arrival to PT. Pt's sister/care giver reports that she is getting around the house better, but states that she feels like the patient is getting more "hard headed"    Patient is accompained by: Family member   Pertinent History Asthma, Dementia, also has down's syndrome   Limitations Walking;Standing   Diagnostic tests CT scan - mild ischemic changes in white matter.    Patient Stated Goals improve balance, improve gait. decrease falls   Currently in Pain? No/denies            [facility] PT Assessment - 11/18/14 0001    Assessment   Referring Provider Hemang K. Manuella Ghazi   Standardized Balance Assessment   Five times sit to stand comments  13 seconds, no HHA ( improved from 30seconds on 10/14/14; <15 seconds indicates decreased fall risk)    10 Meter Walk 0.626ms ( improved  from 0.48m on 10/14/14; <1.89m52mindicates increased fall risk    Berg Balance Test   Sit to Stand Able to stand without using hands and stabilize independently   Standing Unsupported Able to stand safely 2 minutes   Sitting with Back Unsupported but Feet Supported on Floor or Stool Able to sit safely and securely 2 minutes   Stand to Sit Controls descent by using hands   Transfers Able to transfer safely, definite need of hands   Standing Unsupported with Eyes Closed Able to stand 10 seconds with supervision   Standing Ubsupported with Feet Together Able to place feet together independently and stand 1 minute safely   From Standing, Reach Forward with Outstretched Arm Reaches forward but needs supervision   From Standing Position, Pick up Object from Floor Able to pick up shoe, needs supervision   From Standing Position, Turn to Look Behind Over each Shoulder Needs supervision when turning   Turn 360 Degrees Needs close supervision or verbal cueing   Standing Unsupported, Alternately Place Feet on Step/Stool Able to complete >2 steps/needs minimal assist   Standing Unsupported, One Foot in Front Able to take small step independently and hold 30 seconds   Standing on One Leg Tries to lift leg/unable to hold 3 seconds but remains standing independently   Total Score 35  Berg comment: <36 indicates high fall risk (100%); patient improved from 24 on 10/14/14   Timed Up and Go Test   Normal TUG (seconds) 22   TUG Comments No AD. mild improvement from 10/14/14; >14 seconds indicates increased fall risk.         Treatment:   Patient instructed in standardized outcome measures to assess progress toward goals including Berg balance scale, 5x Sit<>stand, 34mwalk test, and TUG. See above for results.   Quantum leg press 120# 2x 15   PT provided Moderate verbal and tactile instruction with standard outcome measures and LE strengthening for proper technique and testing procedure. Patient responded  moderately with instruction from PT and was able to complete at standardized measures.  PT educated Pt and caregiver on the importance of AD use with ambulation within the community due to deficits found with balance through standardized outcome measures.                        PT Education - 11/18/14 0900    Education provided Yes   Education Details standardized oucome measures.    Person(s) Educated Patient   Methods Explanation;Demonstration;Tactile cues;Verbal cues   Comprehension Verbalized understanding;Returned demonstration;Verbal cues required;Tactile cues required             PT Long Term Goals - 11/18/14 0901    PT LONG TERM GOAL #1   Title Patient will be independent with HEP to improve strength and balance and reduce fall risk at home by 12/09/14   Time 8   Period Weeks   Status On-going   PT LONG TERM GOAL #2   Title  Patient (< 746years old) will complete five times sit to stand test in < 15 seconds indicating an increased LE strength and improved balance by 12/09/14   Time 8   Period Weeks   Status Achieved   PT LONG TERM GOAL #3   Title Patient will improve Berg balance scale to >46 to indicate significant improvement in balance and decreased fall risk by 12/09/14   Time 8   Period Weeks   Status Partially Met   PT LONG TERM GOAL #4   Title Patient will improve gait speed to at least 0.8 m/s to indicate improve functional mobility within the community.    Time 8   Period Weeks   Status Partially Met               Plan - 11/18/14 1314    Clinical Impression Statement Patient instructed in standardized outcome measures to assess progress toward goals. Patient demonstrated improve balance based on a increase of 11 points in Berg balance scale, but still scores within the high fall risk(100%) category. Patient demonstrated improve gait with increased gait speed, improved step length, and increased cadence. LE power was also found to be  found to increased with 5x sit<>stand less than 15 seconds. PT educated Patient and caregiver on the importance of AD use to reduce fall risk and increase safety. Patient continues to make progress toward goals, but continues to require skilled PT to improve strength, increase balance, and improve gait to allow increased safety and independence with mobility in the community.     Pt will benefit from skilled therapeutic intervention in order to improve on the following deficits Abnormal gait;Decreased activity tolerance;Decreased balance;Decreased cognition;Decreased endurance;Decreased knowledge of use of DME;Decreased mobility;Decreased range of motion;Decreased safety awareness;Decreased strength;Difficulty walking;Improper body mechanics   Rehab Potential Good  Clinical Impairments Affecting Rehab Potential Positive: good results from prior PT for balance. Negative: Cognitive deficits,    PT Frequency 2x / week   PT Duration 8 weeks   PT Treatment/Interventions ADLs/Self Care Home Management;DME Instruction;Gait training;Stair training;Functional mobility training;Therapeutic activities;Therapeutic exercise;Neuromuscular re-education;Cognitive remediation;Patient/family education;Balance training;Orthotic Fit/Training;Wheelchair mobility training;Manual techniques   PT Next Visit Plan dynamic balance training.    PT Home Exercise Plan continue as given    Consulted and Agree with Plan of Care Patient          G-Codes - 2014/12/16 1500    Functional Assessment Tool Used 5 times sit<>Stand, 10 meter, walk, timed up and go, Berg balance assessment;   Functional Limitation Mobility: Walking and moving around   Mobility: Walking and Moving Around Goal Status [phone]) At least 40 percent but less than 60 percent impaired, limited or restricted   Mobility: Walking and Moving Around Discharge Status [phone]) At least 20 percent but less than 40 percent impaired, limited or restricted      Problem  List Patient Active Problem List   Diagnosis Date Noted  . Onychomycosis    Barrie Folk SPT Dec 16, 2014   3:00 PM  This entire session was performed under direct supervision and direction of a licensed therapist . I have personally read, edited and approve of the note as written.  Hopkins,Margaret  PT, DPT  Dec 16, 2014, 3:00 PM  [place] MAIN [facility] SERVICES [address] [place], [place], [address] Phone: [phone]   Fax:  [phone]  Name: Mikyla Schachter [facility] MRN: [id number] Date of Birth: [birth year]

## 2014-11-23 ENCOUNTER — Encounter: Payer: Self-pay | Admitting: Physical Therapy

## 2014-11-23 ENCOUNTER — Ambulatory Visit: Payer: Medicare Other | Admitting: Physical Therapy

## 2014-11-23 DIAGNOSIS — R269 Unspecified abnormalities of gait and mobility: Secondary | ICD-10-CM

## 2014-11-23 DIAGNOSIS — R531 Weakness: Secondary | ICD-10-CM

## 2014-11-23 DIAGNOSIS — R296 Repeated falls: Secondary | ICD-10-CM

## 2014-11-23 NOTE — Therapy (Signed)
[place] MAIN [facility] SERVICES [address] [place], [place], [address] Phone: [phone]   Fax:  [phone]  Physical Therapy Treatment  Patient Details  Name: Lori Petersen [facility] MRN: [id number] Date of Birth: [birth year] Referring Provider: Hemang K. Manuella Ghazi  Encounter Date: 11/23/2014      PT End of Session - 11/23/14 0856    Visit Number 9   Number of Visits 17   Date for PT Re-Evaluation 12/09/14   Authorization Type gcode [id number]   Authorization Time Period 10   PT Start Time 0845   PT Stop Time 0930   PT Time Calculation (min) 45 min   Equipment Utilized During Treatment Gait belt   Activity Tolerance Patient tolerated treatment well   Behavior During Therapy Flat affect      Past Medical History  Diagnosis Date  . Asthma   . Onychomycosis     Past Surgical History  Procedure Laterality Date  . Hernia repair      There were no vitals filed for this visit.  Visit Diagnosis:  Abnormality of gait  Weakness  Repeated falls      Subjective Assessment - 11/23/14 0854    Subjective Patient states that she is "good" upon arival to PT. She states that she has still been doing her exercises every day.    Patient is accompained by: Family member   Pertinent History Asthma, Dementia, also has down's syndrome   Limitations Walking;Standing   Diagnostic tests CT scan - mild ischemic changes in white matter.    Patient Stated Goals improve balance, improve gait. decrease falls   Currently in Pain? No/denies         Treatement  Nustep level 2, 3 minutes  ( unbilled)  Sit to stand 2x 10 one set with 1 HHA, 1 while holding ball   Supine therex:  SLR 2x 10  Marches red tband 2x 10  Abduction red tband 2x 12  Seated hip abduction red tband  2x12   PT was required to provide constant verbal and tactile instruction to improve exercise technique and participation including increased ROM, decreased speed of movement, and decreased  compensation from the trunk.    Balance   Toe taps on 5 inch step 1 HHA x 8 each LE  Step over and back on cane x 8 each LE  Side step over and back on cane x 8 each LE   PT provided min A with 1 HHA for all dynamic balance exercises. Patient demonstrated increased difficulty with L LE stance with dynamic balance tasks. Constant verbal and tactile instruction was required to increased weight shift on the L LE to allow improved success with balance tasks.                          PT Education - 11/23/14 0855    Education provided Yes   Education Details balance training. LE stengthening    Person(s) Educated Patient   Methods Explanation;Demonstration;Tactile cues;Verbal cues   Comprehension Verbalized understanding;Returned demonstration;Verbal cues required;Tactile cues required             PT Long Term Goals - 11/18/14 0901    PT LONG TERM GOAL #1   Title Patient will be independent with HEP to improve strength and balance and reduce fall risk at home by 12/09/14   Time 8   Period Weeks   Status On-going   PT LONG TERM GOAL #2  Title  Patient (< 55 years old) will complete five times sit to stand test in < 15 seconds indicating an increased LE strength and improved balance by 12/09/14   Time 8   Period Weeks   Status Achieved   PT LONG TERM GOAL #3   Title Patient will improve Berg balance scale to >46 to indicate significant improvement in balance and decreased fall risk by 12/09/14   Time 8   Period Weeks   Status Partially Met   PT LONG TERM GOAL #4   Title Patient will improve gait speed to at least 0.8 m/s to indicate improve functional mobility within the community.    Time 8   Period Weeks   Status Partially Met               Plan - 11/23/14 1226    Clinical Impression Statement Patient instructed in LE strengthening as well as balance training. PT was required to provide constant verbal and instruction for increased exercise  participation as well as increased ROM and speed of exercises. Patient demonstrated increased fear of falling with dynamic balance exercises on this day as well as difficulty with SLS tasks on the L LE. Continued skilled PT is recommended to improve balance, and gait to allow increased safety at home and in the community.     Pt will benefit from skilled therapeutic intervention in order to improve on the following deficits Abnormal gait;Decreased activity tolerance;Decreased balance;Decreased cognition;Decreased endurance;Decreased knowledge of use of DME;Decreased mobility;Decreased range of motion;Decreased safety awareness;Decreased strength;Difficulty walking;Improper body mechanics   Rehab Potential Good   Clinical Impairments Affecting Rehab Potential Positive: good results from prior PT for balance. Negative: Cognitive deficits,    PT Frequency 2x / week   PT Duration 8 weeks   PT Treatment/Interventions ADLs/Self Care Home Management;DME Instruction;Gait training;Stair training;Functional mobility training;Therapeutic activities;Therapeutic exercise;Neuromuscular re-education;Cognitive remediation;Patient/family education;Balance training;Orthotic Fit/Training;Wheelchair mobility training;Manual techniques   PT Next Visit Plan dynamic balance training.    PT Home Exercise Plan continue as given    Consulted and Agree with Plan of Care Patient        Problem List Patient Active Problem List   Diagnosis Date Noted  . Onychomycosis    Barrie Folk SPT 11/23/2014   2:04 PM  This entire session was performed under direct supervision and direction of a licensed therapist . I have personally read, edited and approve of the note as written.  Hopkins,Margaret PT, DPT 11/23/2014, 2:04 PM  [place] MAIN [facility] SERVICES [address] [place], [place], [address] Phone: [phone]   Fax:  [phone]  Name: Lori Petersen [facility] MRN: [id number] Date of  Birth: 05-13-1955

## 2014-11-25 ENCOUNTER — Encounter: Payer: Self-pay | Admitting: Physical Therapy

## 2014-11-25 ENCOUNTER — Ambulatory Visit: Payer: Medicare Other | Admitting: Physical Therapy

## 2014-11-25 DIAGNOSIS — R269 Unspecified abnormalities of gait and mobility: Secondary | ICD-10-CM | POA: Diagnosis not present

## 2014-11-25 DIAGNOSIS — R296 Repeated falls: Secondary | ICD-10-CM

## 2014-11-25 DIAGNOSIS — R531 Weakness: Secondary | ICD-10-CM

## 2014-11-25 NOTE — Therapy (Signed)
[place] MAIN [facility] SERVICES [address] [place], [place], [address] Phone: [phone]   Fax:  [phone]  Physical Therapy Treatment  Patient Details  Name: Lori Petersen [facility] MRN: [id number] Date of Birth: [birth year] Referring Provider: Hemang K. Manuella Ghazi  Encounter Date: 11/25/2014      PT End of Session - 11/25/14 0904    Visit Number 10   Number of Visits 17   Date for PT Re-Evaluation 12/09/14   Authorization Type gcode [id number]   Authorization Time Period 10   PT Start Time [id number]   PT Stop Time 0930   PT Time Calculation (min) 43 min   Equipment Utilized During Treatment Gait belt   Activity Tolerance Patient tolerated treatment well   Behavior During Therapy Flat affect      Past Medical History  Diagnosis Date  . Asthma   . Onychomycosis     Past Surgical History  Procedure Laterality Date  . Hernia repair      There were no vitals filed for this visit.  Visit Diagnosis:  Abnormality of gait  Weakness  Repeated falls      Subjective Assessment - 11/25/14 0853    Subjective Patient states that she is doing good upon arrival to PT. Sister reports that she continues to do her home exercises almost every day and feels like she is getting stronger.    Patient is accompained by: Family member   Pertinent History Asthma, Dementia, also has down's syndrome   Limitations Walking;Standing   Diagnostic tests CT scan - mild ischemic changes in white matter.    Patient Stated Goals improve balance, improve gait. decrease falls   Currently in Pain? No/denies       treatment     Nustep level 3, 3 minutes. (unbilled)   Strengthening.   Quantum leg press 75# 2x 15  Quantum calf press 75# x 4. Patient was unable to perform exercise due to impaired understanding of task. Exercise was discontinued  Side stepping with yellow tband around knees x 10 steps each direction x 3   Marching with yellow tband around knees x 12 each LE   Forward walk with yellow tban daround knees x 3 laps in //bars  Backward walk with yellow tband around knees x 3 laps in // bars.   Balance training   Sit to stand with ball press 2x 10  Sit to stand with ball toss 2x 12   Standing on Airex pad  normal BOS 2x 45 seconds no HHA  Ball pass L and R reaching outside BOS x 15 each direction    PT provided CGA-MinA with static balance training as well as moderate to constant instruction for decreased HHA, improved weight shift to L and R, as well as improved use of hip strategy to prevent lateral LOB. Moderate verbal, visual and tactile instruction was provided with LE strengthening to improve ROM, decreased speed of movement, and  Decreased compensation from trunk. Mild response to instruction from PT with improved ROM with exercises and use of hip strategy with balance training.                     PT Education - 11/25/14 0908    Education provided Yes   Education Details LE strengthening. balance training   Person(s) Educated Patient   Methods Explanation;Demonstration;Tactile cues;Verbal cues   Comprehension Verbalized understanding;Returned demonstration;Verbal cues required;Tactile cues required  PT Long Term Goals - 11/18/14 0901    PT LONG TERM GOAL #1   Title Patient will be independent with HEP to improve strength and balance and reduce fall risk at home by 12/09/14   Time 8   Period Weeks   Status On-going   PT LONG TERM GOAL #2   Title  Patient (< 60 years old) will complete five times sit to stand test in < 15 seconds indicating an increased LE strength and improved balance by 12/09/14   Time 8   Period Weeks   Status Achieved   PT LONG TERM GOAL #3   Title Patient will improve Berg balance scale to >46 to indicate significant improvement in balance and decreased fall risk by 12/09/14   Time 8   Period Weeks   Status Partially Met   PT LONG TERM GOAL #4   Title Patient will improve gait  speed to at least 0.8 m/s to indicate improve functional mobility within the community.    Time 8   Period Weeks   Status Partially Met               Plan - 11/25/14 1219    Clinical Impression Statement Patient instructed in LE strengthening and static balance training. PT provided constant verbal, visual, and tactile instruction for improved exercise technique including decreased HHA with balance training and decreased compensation of the trunk with LE strengthening. Patient demonstrated difficulty with reducing HHA with balance training, but was able to reach beyond BOS with reaching tasks with greater confidence. Continued skilled PT is recommended to improve LE strength, balance, and gait mechanics to improve safety with ambulation at home.     Pt will benefit from skilled therapeutic intervention in order to improve on the following deficits Abnormal gait;Decreased activity tolerance;Decreased balance;Decreased cognition;Decreased endurance;Decreased knowledge of use of DME;Decreased mobility;Decreased range of motion;Decreased safety awareness;Decreased strength;Difficulty walking;Improper body mechanics   Rehab Potential Good   Clinical Impairments Affecting Rehab Potential Positive: good results from prior PT for balance. Negative: Cognitive deficits,    PT Frequency 2x / week   PT Duration 8 weeks   PT Treatment/Interventions ADLs/Self Care Home Management;DME Instruction;Gait training;Stair training;Functional mobility training;Therapeutic activities;Therapeutic exercise;Neuromuscular re-education;Cognitive remediation;Patient/family education;Balance training;Orthotic Fit/Training;Wheelchair mobility training;Manual techniques   PT Next Visit Plan dynamic balance training. LE strengthening.    PT Home Exercise Plan continue as given    Consulted and Agree with Plan of Care Patient        Problem List Patient Active Problem List   Diagnosis Date Noted  . Onychomycosis     Barrie Folk SPT 11/26/2014   8:30 AM  This entire session was performed under direct supervision and direction of a licensed therapist. I have personally read, edited and approve of the note as written.  Hopkins,Margaret PT, DPT 11/26/2014, 8:30 AM  [place] MAIN [facility] SERVICES [place], [place], [address] Phone: [phone]   Fax:  [phone]  Name: Lori Petersen [facility] MRN: [id number] Date of Birth: 07-05-1955

## 2014-11-30 ENCOUNTER — Encounter: Payer: Self-pay | Admitting: Physical Therapy

## 2014-11-30 ENCOUNTER — Ambulatory Visit: Payer: Medicare Other | Admitting: Physical Therapy

## 2014-11-30 DIAGNOSIS — R296 Repeated falls: Secondary | ICD-10-CM

## 2014-11-30 DIAGNOSIS — R531 Weakness: Secondary | ICD-10-CM

## 2014-11-30 DIAGNOSIS — R269 Unspecified abnormalities of gait and mobility: Secondary | ICD-10-CM

## 2014-11-30 NOTE — Therapy (Signed)
[place] MAIN [facility] SERVICES [address] [place], [place], [address] Phone: [phone]   Fax:  [phone]  Physical Therapy Treatment  Patient Details  Name: Lori Petersen [facility] MRN: [id number] Date of Birth: 01/22/1955 Referring Provider: Hemang K. Manuella Ghazi  Encounter Date: 11/30/2014      PT End of Session - 11/30/14 1330    Visit Number 11   Number of Visits 17   Date for PT Re-Evaluation 12/09/14   Authorization Type gcode 4   Authorization Time Period 10   PT Start Time 0845   PT Stop Time 0925   PT Time Calculation (min) 40 min   Equipment Utilized During Treatment Gait belt   Activity Tolerance Patient tolerated treatment well   Behavior During Therapy Flat affect      Past Medical History  Diagnosis Date  . Asthma   . Onychomycosis     Past Surgical History  Procedure Laterality Date  . Hernia repair      There were no vitals filed for this visit.  Visit Diagnosis:  Abnormality of gait  Weakness  Repeated falls      Subjective Assessment - 11/30/14 0848    Subjective Patient reports that she is doing well. She continues to report no new falls since the start of PT. Sister reports that the patient had a good weekend and got around the house safely over the weekend.    Patient is accompained by: Family member   Pertinent History Asthma, Dementia, also has down's syndrome   Limitations Walking;Standing   Diagnostic tests CT scan - mild ischemic changes in white matter.    Patient Stated Goals improve balance, improve gait. decrease falls   Currently in Pain? No/denies        treatment :    Quantum leg press 75#x 15, 90#x 15  Supine therex  Bridges 2x12 with red tband at knees Hip abduction 2x 12 red tband  Marchers 2x 12 red tband   Sit to stand with ball toss outside BOS. X 12   Stepping over cane x 8 leading with the L LE.  Stepping over cane x 8 leading with the R LE.   Lateral mini lunges x 10 BLE   Forward foot taps on 4 inch step. X 12 BLE    Constant verbal instruction for improved step length and step height with dynamic balance tasks as well as improved forward step progression and decreased L LE IR with steps. Mild response note by patient following instruction with increased hip and knee flexion as well as improve step length. Patient required continued instruction for increased exercise compliance. Patient was able to advance to perform all stepping exercises without HHA on this day.                          PT Education - 11/30/14 1454    Education provided Yes   Education Details balance training. LE strengthening.    Person(s) Educated Patient   Methods Explanation;Tactile cues;Verbal cues;Demonstration   Comprehension Verbalized understanding;Returned demonstration;Verbal cues required;Tactile cues required             PT Long Term Goals - 11/18/14 0901    PT LONG TERM GOAL #1   Title Patient will be independent with HEP to improve strength and balance and reduce fall risk at home by 12/09/14   Time 8   Period Weeks   Status On-going   PT LONG  TERM GOAL #2   Title  Patient (< 53 years old) will complete five times sit to stand test in < 15 seconds indicating an increased LE strength and improved balance by 12/09/14   Time 8   Period Weeks   Status Achieved   PT LONG TERM GOAL #3   Title Patient will improve Berg balance scale to >46 to indicate significant improvement in balance and decreased fall risk by 12/09/14   Time 8   Period Weeks   Status Partially Met   PT LONG TERM GOAL #4   Title Patient will improve gait speed to at least 0.8 m/s to indicate improve functional mobility within the community.    Time 8   Period Weeks   Status Partially Met               Plan - 11/30/14 1325    Clinical Impression Statement Patient instructed in LE strengthening as well as balance training. Patient required constant verbal, visual , and  tactile instruction for improve exercise technique including increased ROM, decreased speed of movement and decreased compensation from trunk to improve therex. Mod- constant verbal instruction provided with static and dynamic balance training. Patient was able to perform static and dynamic balance training on this day without the use of UE support, but continued to have increased difficulty with SLS on the L LE. Continued skilled PT is recommended to improve gait, balance, and LE strength  to improve safety within the home and in the community with gait.    Pt will benefit from skilled therapeutic intervention in order to improve on the following deficits Abnormal gait;Decreased activity tolerance;Decreased balance;Decreased cognition;Decreased endurance;Decreased knowledge of use of DME;Decreased mobility;Decreased range of motion;Decreased safety awareness;Decreased strength;Difficulty walking;Improper body mechanics   Rehab Potential Good   Clinical Impairments Affecting Rehab Potential Positive: good results from prior PT for balance. Negative: Cognitive deficits,    PT Frequency 2x / week   PT Duration 8 weeks   PT Treatment/Interventions ADLs/Self Care Home Management;DME Instruction;Gait training;Stair training;Functional mobility training;Therapeutic activities;Therapeutic exercise;Neuromuscular re-education;Cognitive remediation;Patient/family education;Balance training;Orthotic Fit/Training;Wheelchair mobility training;Manual techniques   PT Next Visit Plan dynamic balance training. LE strengthening.    PT Home Exercise Plan continue as given    Consulted and Agree with Plan of Care Patient        Problem List Patient Active Problem List   Diagnosis Date Noted  . Onychomycosis    Barrie Folk SPT 11/30/2014   5:03 PM  This entire session was performed under direct supervision and direction of a licensed therapist. I have personally read, edited and approve of the note as written.    Hopkins,Margaret  PT, DPT 11/30/2014, 5:03 PM  [place] MAIN [facility] SERVICES [address] [place], [place], [address] Phone: [phone]   Fax:  [phone]  Name: Lori Petersen [facility] MRN: [id number] Date of Birth: 1955/01/30

## 2014-12-02 ENCOUNTER — Ambulatory Visit: Payer: Medicare Other | Admitting: Physical Therapy

## 2014-12-02 ENCOUNTER — Encounter: Payer: Self-pay | Admitting: Physical Therapy

## 2014-12-02 DIAGNOSIS — R269 Unspecified abnormalities of gait and mobility: Secondary | ICD-10-CM

## 2014-12-02 DIAGNOSIS — R531 Weakness: Secondary | ICD-10-CM

## 2014-12-02 DIAGNOSIS — R296 Repeated falls: Secondary | ICD-10-CM

## 2014-12-02 NOTE — Therapy (Signed)
[place] [facility] [address] [place], [place], [address] Phone: [phone]   Fax:  [phone]  Physical Therapy Treatment  Patient Details  Name: Lori [facility] MRN: [id number] Date of Birth: 01-19-1955 Referring Kalie Cabral: Hemang K. Manuella Ghazi  Encounter Date: 12/02/2014      PT End of Session - 12/02/14 0933    Visit Number 12   Number of Visits 17   Date for PT Re-Evaluation 12/09/14   Authorization Type gcode [id number]   Authorization Time Period 10   PT Start Time 0845   PT Stop Time 0930   PT Time Calculation (min) 45 min   Equipment Utilized During Treatment Gait belt   Activity Tolerance Patient tolerated treatment well   Behavior During Therapy Flat affect      Past Medical History  Diagnosis Date  . Asthma   . Onychomycosis     Past Surgical History  Procedure Laterality Date  . Hernia repair      There were no vitals filed for this visit.  Visit Diagnosis:  Abnormality of gait  Weakness  Repeated falls      Subjective Assessment - 12/02/14 0857    Subjective Patient reports that she is doing well upon arrival to PT. Her sister states that she may miss one appointment next week. She also states that the patient is "getting around" safer than several weeks ago.    Patient is accompained by: Family member   Pertinent History Asthma, Dementia, also has down's syndrome   Limitations Walking;Standing   Diagnostic tests CT scan - mild ischemic changes in white matter.    Patient Stated Goals improve balance, improve gait. decrease falls   Currently in Pain? No/denies            Therex Quantum leg press 75 # 2x 15  Side stepping with red tband around knees x 3 laps in //bars Backward walking with red tband around kneesx 3 laps in // bars  Standing marches with red tband around knees x 12 each LE.   Cues for increased ROM/ increased step length, decreased speed of movement, decreased compensation from  the trunk. Patient required constant verbal instruction for maintained improved exercise technique.   Gait training Gait on treadmill 5 minutes 1.78mh. 2 HHA  Step up and over on 4 inch step with light 1 HHA. X 8 BLE  Gait over 4 canes no AD x 3 laps with each LE.   PT provided moderate verbal and tactile instruction to reduce HHA with step ups, increase step length and foot clearance with gait training on treadmill and over obstacles. Patient responded very well to instruction and was able to clear cane without HHA for all repetitions.                        PT Education - 12/02/14 0933    Education provided Yes   Education Details Gait and LE strengthening.    Person(s) Educated Patient   Methods Explanation;Demonstration;Tactile cues;Verbal cues   Comprehension Verbalized understanding;Returned demonstration;Verbal cues required;Tactile cues required             PT Long Term Goals - 11/18/14 0901    PT LONG TERM GOAL #1   Title Patient will be independent with HEP to improve strength and balance and reduce fall risk at home by 12/09/14   Time 8   Period Weeks   Status On-going   PT LONG TERM  GOAL #2   Title  Patient (< 3 years old) will complete five times sit to stand test in < 15 seconds indicating an increased LE strength and improved balance by 12/09/14   Time 8   Period Weeks   Status Achieved   PT LONG TERM GOAL #3   Title Patient will improve Berg balance scale to >46 to indicate significant improvement in balance and decreased fall risk by 12/09/14   Time 8   Period Weeks   Status Partially Met   PT LONG TERM GOAL #4   Title Patient will improve gait speed to at least 0.8 m/s to indicate improve functional mobility within the community.    Time 8   Period Weeks   Status Partially Met               Plan - 12/02/14 1418    Clinical Impression Statement Patient instructed in gait training and LE strengthening. Gait training performed  on treadmill with constant cues to increase step length and improve foot clearance. Patient was able to perform gait training over 1 inch obstacles; patient was able to complete without any HHA on this day, but still demonstrated increased difficulty with weight shift over the L LE. Patient was also able to perform step up activities with BLE and only minor assist at the UE. Continued skilled PT is recommended to improve balance, gait, and LE strength to improve safety at home and in the community.   Pt will benefit from skilled therapeutic intervention in order to improve on the following deficits Abnormal gait;Decreased activity tolerance;Decreased balance;Decreased cognition;Decreased endurance;Decreased knowledge of use of DME;Decreased mobility;Decreased range of motion;Decreased safety awareness;Decreased strength;Difficulty walking;Improper body mechanics   Rehab Potential Good   Clinical Impairments Affecting Rehab Potential Positive: good results from prior PT for balance. Negative: Cognitive deficits,    PT Frequency 2x / week   PT Duration 8 weeks   PT Treatment/Interventions ADLs/Self Care Home Management;DME Instruction;Gait training;Stair training;Functional mobility training;Therapeutic activities;Therapeutic exercise;Neuromuscular re-education;Cognitive remediation;Patient/family education;Balance training;Orthotic Fit/Training;Wheelchair mobility training;Manual techniques   PT Next Visit Plan dynamic balance training. variable gait training.    PT Home Exercise Plan continue as given    Consulted and Agree with Plan of Care Patient        Problem List Patient Active Problem List   Diagnosis Date Noted  . Onychomycosis    Barrie Folk SPT 12/02/2014   2:53 PM  This entire session was performed under direct supervision and direction of a licensed therapist. I have personally read, edited and approve of the note as written.  Hopkins,Margaret PT, DPT 12/02/2014, 2:53  PM  [place] [facility] [address] [place], [place], [address] Phone: [phone]   Fax:  [phone]  Name: Lori [facility] MRN: [id number] Date of Birth: Nov 25, 1955

## 2014-12-07 ENCOUNTER — Ambulatory Visit: Payer: Medicare Other | Admitting: Physical Therapy

## 2014-12-07 ENCOUNTER — Encounter: Payer: Self-pay | Admitting: Physical Therapy

## 2014-12-07 DIAGNOSIS — R531 Weakness: Secondary | ICD-10-CM

## 2014-12-07 DIAGNOSIS — R269 Unspecified abnormalities of gait and mobility: Secondary | ICD-10-CM

## 2014-12-07 DIAGNOSIS — R296 Repeated falls: Secondary | ICD-10-CM

## 2014-12-07 NOTE — Therapy (Signed)
[place] [facility] [address] [place], [place], [address] Phone: [phone]   Fax:  [phone]  Physical Therapy Treatment/Discharge summary  Patient Details  Name: Lori [facility] MRN: [id number] Date of Birth: Aug 14, 1955 Referring Provider: Hemang K. Manuella Ghazi  Encounter Date: 12/07/2014      PT End of Session - 12/07/14 0957    Visit Number 13   Number of Visits 17   Date for PT Re-Evaluation 12/09/14   Authorization Type gcode 6   Authorization Time Period 10   PT Start Time 0846   PT Stop Time 0932   PT Time Calculation (min) 46 min   Equipment Utilized During Treatment Gait belt   Activity Tolerance Patient tolerated treatment well   Behavior During Therapy WFL for tasks assessed/performed      Past Medical History  Diagnosis Date  . Asthma   . Onychomycosis     Past Surgical History  Procedure Laterality Date  . Hernia repair      There were no vitals filed for this visit.  Visit Diagnosis:  Abnormality of gait  Weakness  Repeated falls      Subjective Assessment - 12/07/14 0955    Subjective Patient reports that she is doing well. Her sister reports that she heard her sweeping in her room and moving her bed around. Patient denies any pain and reports compliance with exercise.   Patient is accompained by: Family member   Pertinent History Asthma, Dementia, also has down's syndrome   Limitations Walking;Standing   Diagnostic tests CT scan - mild ischemic changes in white matter.    Patient Stated Goals improve balance, improve gait. decrease falls   Currently in Pain? No/denies            [facility] PT Assessment - 12/07/14 0001    Ambulation/Gait   Gait Comments Patient continues to demonstrate trendelenburg gait bilaterally with increased lateral flexion during gait. wide base of support   Standardized Balance Assessment   Five times sit to stand comments  14 sec no HHA (no significant difference  from last reassessment on 11/18/14 which was 13 sec with no HHA ); low fall risk;    10 Meter Walk 0.512 m/s ( slightly slower than last reassessment on 11/18/14 which was 0.625 m/s; limited community ambulator)   Furniture conservator/restorer   Sit to Stand Able to stand without using hands and stabilize independently   Standing Unsupported Able to stand safely 2 minutes   Sitting with Back Unsupported but Feet Supported on Floor or Stool Able to sit safely and securely 2 minutes   Stand to Sit Sits safely with minimal use of hands   Transfers Able to transfer safely, minor use of hands   Standing Unsupported with Eyes Closed Able to stand 10 seconds with supervision   Standing Ubsupported with Feet Together Able to place feet together independently and stand 1 minute safely   From Standing, Reach Forward with Outstretched Arm Can reach forward >12 cm safely (5")   From Standing Position, Pick up Object from Floor Able to pick up shoe safely and easily   From Standing Position, Turn to Look Behind Over each Shoulder Looks behind from both sides and weight shifts well   Turn 360 Degrees Able to turn 360 degrees safely but slowly   Standing Unsupported, Alternately Place Feet on Step/Stool Needs assistance to keep from falling or unable to try   Standing Unsupported, One Foot in [place]  help to step but can hold 15 seconds   Standing on One Leg Unable to try or needs assist to prevent fall   Total Score 41   Berg comment: improved from last reassessment on 11/18/14 which was 35/56; 80% risk for falls;          Warm up on Nustep level 3 BUE/BLE x5 min (Unbilled);  PT instructed patient in 5 times sit<>stand x2 reps; patient required mod Vcs to improve weight shift upon standing to avoid falling backwards into chair. Patient was able to demonstrate improved safety upon 2nd attempt.  Instructed patient in [place]  Balance assessment, please see above. Patient demonstrates significant improvement from last  reassessment.  Patient demonstrates no significant change in gait speed as compared to previous sessions. She has been compliant with HEP, but continues to have hip weakness and trendelenburg gait.  PT informed patient and family that this could be her new baseline as she has not responded to conservative treatment.                    PT Education - 12/07/14 0956    Education provided Yes   Education Details progress towards goals, reeducated on HEP   Person(s) Educated Patient   Methods Explanation   Comprehension Verbalized understanding             PT Long Term Goals - 12/07/14 0923    PT LONG TERM GOAL #1   Title Patient will be independent with HEP to improve strength and balance and reduce fall risk at home by 12/09/14   Time 8   Period Weeks   Status Achieved   PT LONG TERM GOAL #2   Title  Patient (< 63 years old) will complete five times sit to stand test in < 15 seconds indicating an increased LE strength and improved balance by 12/09/14   Time 8   Period Weeks   Status Achieved   PT LONG TERM GOAL #3   Title Patient will improve Berg balance scale to >46 to indicate significant improvement in balance and decreased fall risk by 12/09/14   Time 8   Period Weeks   Status Partially Met   PT LONG TERM GOAL #4   Title Patient will improve gait speed to at least 0.8 m/s to indicate improve functional mobility within the community.    Time 8   Period Weeks   Status Partially Met               Plan - 12/07/14 0957    Clinical Impression Statement PT assessed patient's progress towards goals. Patient demonstrates significant improvement in balance as evidenced by improved score on berg balance scale. She demonstrates no significant change with 5 times sit<>Stand and 10 meter walk gait speed. Patient reports compliance with HEP. Her sister reports that she has been more active at home and was even able to negotiate a curb with less difficulty. Patient  has made progress towards most goals and met others. She is appropriate for discharge from PT at this time due to improved mobility and reduced fall risk. Patient and family agreeable.    Pt will benefit from skilled therapeutic intervention in order to improve on the following deficits Abnormal gait;Decreased activity tolerance;Decreased balance;Decreased cognition;Decreased endurance;Decreased knowledge of use of DME;Decreased mobility;Decreased range of motion;Decreased safety awareness;Decreased strength;Difficulty walking;Improper body mechanics   Rehab Potential Good   Clinical Impairments Affecting Rehab Potential Positive: good results from prior PT for balance. Negative:  Cognitive deficits,    PT Frequency 2x / week   PT Duration 8 weeks   PT Treatment/Interventions ADLs/Self Care Home Management;DME Instruction;Gait training;Stair training;Functional mobility training;Therapeutic activities;Therapeutic exercise;Neuromuscular re-education;Cognitive remediation;Patient/family education;Balance training;Orthotic Fit/Training;Wheelchair mobility training;Manual techniques   PT Next Visit Plan --   PT Home Exercise Plan continue as given    Consulted and Agree with Plan of Care Patient          G-Codes - Dec 26, 2014 1003    Functional Assessment Tool Used 5 times sit<>Stand, 10 meter, walk, timed up and go, Berg balance assessment;   Functional Limitation Mobility: Walking and moving around   Mobility: Walking and Moving Around Goal Status [phone]) At least 20 percent but less than 40 percent impaired, limited or restricted   Mobility: Walking and Moving Around Discharge Status [phone]) At least 40 percent but less than 60 percent impaired, limited or restricted      Problem List Patient Active Problem List   Diagnosis Date Noted  . Onychomycosis     Hopkins, PT, DPT 2014-12-26, 10:05 AM  [place] [facility] [address]  [place], [place], [address] Phone: [phone]   Fax:  [phone]  Name: Juliette Standre [facility] MRN: [id number] Date of Birth: [birth year]

## 2014-12-09 ENCOUNTER — Encounter: Payer: Medicare Other | Admitting: Physical Therapy

## 2015-02-15 ENCOUNTER — Encounter: Payer: Self-pay | Admitting: Podiatry

## 2015-02-15 ENCOUNTER — Ambulatory Visit (INDEPENDENT_AMBULATORY_CARE_PROVIDER_SITE_OTHER): Payer: Medicare Other | Admitting: Podiatry

## 2015-02-15 DIAGNOSIS — B351 Tinea unguium: Secondary | ICD-10-CM

## 2015-02-15 DIAGNOSIS — M79676 Pain in unspecified toe(s): Secondary | ICD-10-CM | POA: Diagnosis not present

## 2015-02-15 NOTE — Progress Notes (Signed)
She presents today with a chief complaint of painful elongated toenails 1 through 5 bilateral.  Objective: Vital signs are stable she is alert and oriented 3. Pulses are strongly palpable. Neurologic sensorium is intact. Toenails are thick yellow dystrophic mildly mycotic and painful palpation.  Assessment: Pain in limb secondary to onychomycosis.  Plan: Debridement toenails 1 through 5 bilateral cover service secondary to pain.

## 2015-02-17 ENCOUNTER — Other Ambulatory Visit: Payer: Self-pay | Admitting: Family Medicine

## 2015-02-17 DIAGNOSIS — Z1231 Encounter for screening mammogram for malignant neoplasm of breast: Secondary | ICD-10-CM

## 2015-03-01 ENCOUNTER — Ambulatory Visit
Admission: RE | Admit: 2015-03-01 | Discharge: 2015-03-01 | Disposition: A | Discharge status: Home / Self Care | Payer: Medicare Other | Source: Ambulatory Visit | Attending: Family Medicine | Admitting: Family Medicine

## 2015-03-01 DIAGNOSIS — Z1231 Encounter for screening mammogram for malignant neoplasm of breast: Secondary | ICD-10-CM

## 2015-05-14 DIAGNOSIS — R001 Bradycardia, unspecified: Secondary | ICD-10-CM | POA: Insufficient documentation

## 2015-05-17 ENCOUNTER — Encounter: Payer: Self-pay | Admitting: Podiatry

## 2015-05-17 ENCOUNTER — Ambulatory Visit (INDEPENDENT_AMBULATORY_CARE_PROVIDER_SITE_OTHER): Payer: Medicare Other | Admitting: Podiatry

## 2015-05-17 DIAGNOSIS — M79676 Pain in unspecified toe(s): Secondary | ICD-10-CM

## 2015-05-17 DIAGNOSIS — B351 Tinea unguium: Secondary | ICD-10-CM

## 2015-05-17 NOTE — Progress Notes (Signed)
She presents today with chief complaint of painful elongated thick nails.  Objective: signs stable alert and oriented 3. Pulses are palpable. Neurologic and [place] is intact. Deep tendon reflexes are intact toenails are thick yellow dystrophic length mycotic and painful palpation.  Assessment: pain in limb secondary onychomycosis 1 through 5 bilateral.  Plan: Debridement of toenails 1 through 5 bilateral.

## 2015-08-16 ENCOUNTER — Ambulatory Visit: Payer: Medicare Other | Admitting: Podiatry

## 2015-08-25 ENCOUNTER — Ambulatory Visit (INDEPENDENT_AMBULATORY_CARE_PROVIDER_SITE_OTHER): Payer: Medicare Other | Admitting: Podiatry

## 2015-08-25 DIAGNOSIS — B351 Tinea unguium: Secondary | ICD-10-CM | POA: Diagnosis not present

## 2015-08-25 DIAGNOSIS — M79676 Pain in unspecified toe(s): Secondary | ICD-10-CM | POA: Diagnosis not present

## 2015-08-25 NOTE — Progress Notes (Signed)
She presents today with a chief complaint of painful elongated toenails.  Objective: Vital signs are stable she is alert and oriented 3. Pulses are palpable. Toenails are thick yellow dystrophic onychomycotic no open lesions or wounds.  Assessment: Pain in limb secondary to onychomycosis.  Plan: Debridement of toenails 1 through 5 bilateral.

## 2015-11-26 ENCOUNTER — Ambulatory Visit: Payer: Medicare Other | Admitting: Podiatry

## 2015-12-31 ENCOUNTER — Ambulatory Visit (INDEPENDENT_AMBULATORY_CARE_PROVIDER_SITE_OTHER): Payer: Medicare Other | Admitting: Podiatry

## 2015-12-31 DIAGNOSIS — L608 Other nail disorders: Secondary | ICD-10-CM

## 2015-12-31 DIAGNOSIS — L603 Nail dystrophy: Secondary | ICD-10-CM

## 2015-12-31 DIAGNOSIS — B351 Tinea unguium: Secondary | ICD-10-CM

## 2015-12-31 DIAGNOSIS — M79609 Pain in unspecified limb: Secondary | ICD-10-CM

## 2016-01-02 NOTE — Progress Notes (Signed)

## 2016-01-21 ENCOUNTER — Other Ambulatory Visit: Payer: Self-pay | Admitting: Family Medicine

## 2016-01-21 DIAGNOSIS — Z1231 Encounter for screening mammogram for malignant neoplasm of breast: Secondary | ICD-10-CM

## 2016-03-03 ENCOUNTER — Ambulatory Visit
Admission: RE | Admit: 2016-03-03 | Discharge: 2016-03-03 | Disposition: A | Discharge status: Home / Self Care | Payer: Medicare Other | Source: Ambulatory Visit | Attending: Family Medicine | Admitting: Family Medicine

## 2016-03-03 DIAGNOSIS — Z1231 Encounter for screening mammogram for malignant neoplasm of breast: Secondary | ICD-10-CM

## 2016-03-03 DIAGNOSIS — R928 Other abnormal and inconclusive findings on diagnostic imaging of breast: Secondary | ICD-10-CM | POA: Insufficient documentation

## 2016-03-07 ENCOUNTER — Other Ambulatory Visit: Payer: Self-pay | Admitting: Family Medicine

## 2016-03-07 DIAGNOSIS — N6320 Unspecified lump in the left breast, unspecified quadrant: Secondary | ICD-10-CM

## 2016-03-07 DIAGNOSIS — R928 Other abnormal and inconclusive findings on diagnostic imaging of breast: Secondary | ICD-10-CM

## 2016-03-24 ENCOUNTER — Ambulatory Visit
Admission: RE | Admit: 2016-03-24 | Discharge: 2016-03-24 | Disposition: A | Discharge status: Home / Self Care | Payer: Medicare Other | Source: Ambulatory Visit | Attending: Family Medicine | Admitting: Family Medicine

## 2016-03-24 DIAGNOSIS — R928 Other abnormal and inconclusive findings on diagnostic imaging of breast: Secondary | ICD-10-CM

## 2016-03-24 DIAGNOSIS — N6320 Unspecified lump in the left breast, unspecified quadrant: Secondary | ICD-10-CM

## 2016-03-28 ENCOUNTER — Other Ambulatory Visit: Payer: Self-pay | Admitting: Family Medicine

## 2016-03-28 DIAGNOSIS — R928 Other abnormal and inconclusive findings on diagnostic imaging of breast: Secondary | ICD-10-CM

## 2016-03-28 DIAGNOSIS — N6320 Unspecified lump in the left breast, unspecified quadrant: Secondary | ICD-10-CM

## 2016-03-29 ENCOUNTER — Ambulatory Visit (INDEPENDENT_AMBULATORY_CARE_PROVIDER_SITE_OTHER): Payer: Medicare Other | Admitting: Gastroenterology

## 2016-03-29 ENCOUNTER — Other Ambulatory Visit: Payer: Self-pay

## 2016-03-29 ENCOUNTER — Encounter: Payer: Self-pay | Admitting: Gastroenterology

## 2016-03-29 VITALS — BP 125/65 | HR 48 | Temp 97.9°F | Ht 63.0 in | Wt 204.0 lb

## 2016-03-29 DIAGNOSIS — R05 Cough: Secondary | ICD-10-CM

## 2016-03-29 DIAGNOSIS — K921 Melena: Secondary | ICD-10-CM

## 2016-03-29 DIAGNOSIS — R059 Cough, unspecified: Secondary | ICD-10-CM

## 2016-03-29 MED ORDER — PEG 3350-KCL-NABCB-NACL-NASULF 236 G PO SOLR: ORAL | 0 refills | Status: DC

## 2016-03-29 NOTE — Progress Notes (Signed)
Gastroenterology Consultation  Referring Provider:     [facility] Primary Care Physician:  [facility] Primary Gastroenterologist:  Dr. Servando SnareWohl     Reason for Consultation:     Hematochezia        HPI:   Lori Petersen is a 61 y.o. y/o female referred for consultation & management of Hematochezia by Dr. [facility].  This patient comes in today with her sister who states she has been taking care of the patient for last 30 years. The patient has dementia and Down's syndrome. The patient has been having rectal bleeding. The patient is not able to give much history but the sister states that she has not reported any unexplained weight loss fevers chills nausea or vomiting. The patient had a colonoscopy which appears to be in over 10 years ago at [facility]. The sister reports that the patient also has been found to have a breast lesion with possible enlargement of lymph nodes and is supposed to be worked up for that next week. Now being asked to see the patient for her rectal bleeding.   Past Medical History:  Diagnosis Date  . Asthma   . Onychomycosis     Past Surgical History:  Procedure Laterality Date  . HERNIA REPAIR      Prior to Admission medications   Medication Sig Start Date End Date Taking? Authorizing Provider  aspirin 81 MG tablet Take 81 mg by mouth daily.   Yes Historical Provider, MD  donepezil (ARICEPT) 10 MG tablet Take by mouth. 05/07/15 05/06/16 Yes Historical Provider, MD  Fluticasone-Salmeterol (ADVAIR DISKUS) 250-50 MCG/DOSE AEPB Inhale 1 puff into the lungs every 12 (twelve) hours.   Yes Historical Provider, MD  FUROSEMIDE PO Take by mouth. TAKE ONE TABLET TWICE WEEKLY   Yes Historical Provider, MD  latanoprost (XALATAN) 0.005 % ophthalmic solution  05/07/13  Yes Historical Provider, MD  levothyroxine (SYNTHROID, LEVOTHROID) 112 MCG tablet Take 112 mcg by mouth daily.   Yes Historical Provider, MD  potassium chloride SA (K-DUR,KLOR-CON)  20 MEQ tablet  04/30/13  Yes Historical Provider, MD  pravastatin (PRAVACHOL) 40 MG tablet Take 40 mg by mouth daily.   Yes Historical Provider, MD  VITAMIN D, CHOLECALCIFEROL, PO Take by mouth. TAKE ONE CAPSULE ONCE WEEKLY   Yes Historical Provider, MD    History reviewed. No pertinent family history.   Social History  Substance Use Topics  . Smoking status: Never Smoker  . Smokeless tobacco: Never Used  . Alcohol use Not on file    Allergies as of 03/29/2016  . (No Known Allergies)    Review of Systems:    All systems reviewed and negative except where noted in HPI.   Physical Exam:  BP 125/65   Pulse (!) 48   Temp 97.9 F (36.6 C) (Oral)   Ht 5\' 3"  (1.6 m)   Wt 204 lb (92.5 kg)   BMI 36.14 kg/m  No LMP recorded. Patient is postmenopausal. Psych:  Alert and cooperative. Normal mood and affect. General:   Alert,  Well-developed, well-nourished, pleasant and cooperative in NAD Head:  Normocephalic and atraumatic. Eyes:  Sclera clear, no icterus.   Conjunctiva pink. Ears:  Normal auditory acuity. Nose:  No deformity, discharge, or lesions. Mouth:  No deformity or lesions,oropharynx pink & moist. Neck:  Supple; no masses or thyromegaly. Lungs:  Respirations even and unlabored.  Clear throughout to auscultation.   No wheezes, crackles, or rhonchi. No acute distress. Heart:  Regular rate and rhythm; no murmurs, clicks, rubs, or gallops. Abdomen:  Normal bowel sounds.  No bruits.  Soft, non-tender and non-distended without masses, hepatosplenomegaly or hernias noted.  No guarding or rebound tenderness.  Negative Carnett sign.   Rectal:  Deferred.  Msk:  Symmetrical without gross deformities.  Good, equal movement & strength bilaterally. Pulses:  Normal pulses noted. Extremities:  No clubbing or edema.  No cyanosis. Neurologic:  Alert. Skin:  Intact without significant lesions or rashes.  No jaundice. Lymph Nodes:  No significant cervical adenopathy. Psych:  Alert and  cooperative. Normal mood and affect.  Imaging Studies: US Breast Ltd Uni Left Inc Axilla  Result Date: 03/24/2016 CLINICAL DATA:  61 year old patient with possible architectural distortion identified on recent screening mammogram. The patient's sister, Lori Petersen, presents with her today. Lori Petersen lives with and has been the caregiver for Lori Petersen for 30 years. Lori Petersen has Down Syndrome. EXAM: 2D DIGITAL DIAGNOSTIC LEFT MAMMOGRAM WITH CAD AND ADJUNCT TOMO ULTRASOUND LEFT BREAST COMPARISON:  Screening mammogram 03/03/2016 ACR Breast Density Category a: The breast tissue is almost entirely fatty. FINDINGS: Additional mammographic views of the outer left breast confirm a subtle focal area of architectural distortion/mass. The distortion/mass is seen in both views, but with compression is best visualized in the 90 degree lateral projection. This area of distortion spans approximately 1.3 cm greatest diameter. Mammographic images were processed with CAD. On physical exam, the breasts are large. No mass is palpated in the outer left breast. Targeted ultrasound is performed, showing fatty breast parenchyma. No sonographic correlate to the mammographically identified area of distortion/mass. Ultrasound of the left axilla does identify one lymph node with diffuse cortical thickening. This lymph node measures 1.3 x 0.9 x 0.8 cm. There is no discrete fatty hilum. No additional suspicious lymph nodes are seen. IMPRESSION: 1. Focal architectural distortion/mass in the outer left breast seen on mammography. This area is not identified on ultrasound. Malignancy cannot be excluded. 2. Single left axillary lymph node with diffuse cortical thickening for which malignant involvement cannot be excluded. RECOMMENDATION: 1. Stereotactic biopsy of the left breast mass is recommended. 2. Ultrasound-guided biopsy of the left axillary mass is recommended. I have discussed the findings and recommendations with the patient. Results were also  provided in writing at the conclusion of the visit. If applicable, a reminder letter will be sent to the patient regarding the next appointment. BI-RADS CATEGORY  4: Suspicious. Electronically Signed   By: Britta Mccreedy M.D.   On: 03/24/2016 12:15   Mm Diag Breast Tomo Uni Left  Result Date: 03/24/2016 CLINICAL DATA:  61 year old patient with possible architectural distortion identified on recent screening mammogram. The patient's sister, Lori Petersen, presents with her today. Lori Petersen lives with and has been the caregiver for Lori Petersen for 30 years. Lori Petersen has Down Syndrome. EXAM: 2D DIGITAL DIAGNOSTIC LEFT MAMMOGRAM WITH CAD AND ADJUNCT TOMO ULTRASOUND LEFT BREAST COMPARISON:  Screening mammogram 03/03/2016 ACR Breast Density Category a: The breast tissue is almost entirely fatty. FINDINGS: Additional mammographic views of the outer left breast confirm a subtle focal area of architectural distortion/mass. The distortion/mass is seen in both views, but with compression is best visualized in the 90 degree lateral projection. This area of distortion spans approximately 1.3 cm greatest diameter. Mammographic images were processed with CAD. On physical exam, the breasts are large. No mass is palpated in the outer left breast. Targeted ultrasound is performed, showing fatty breast parenchyma. No sonographic correlate to the mammographically identified area of  distortion/mass. Ultrasound of the left axilla does identify one lymph node with diffuse cortical thickening. This lymph node measures 1.3 x 0.9 x 0.8 cm. There is no discrete fatty hilum. No additional suspicious lymph nodes are seen. IMPRESSION: 1. Focal architectural distortion/mass in the outer left breast seen on mammography. This area is not identified on ultrasound. Malignancy cannot be excluded. 2. Single left axillary lymph node with diffuse cortical thickening for which malignant involvement cannot be excluded. RECOMMENDATION: 1. Stereotactic biopsy of the left  breast mass is recommended. 2. Ultrasound-guided biopsy of the left axillary mass is recommended. I have discussed the findings and recommendations with the patient. Results were also provided in writing at the conclusion of the visit. If applicable, a reminder letter will be sent to the patient regarding the next appointment. BI-RADS CATEGORY  4: Suspicious. Electronically Signed   By: Britta Mccreedy M.D.   On: 03/24/2016 12:15   Mm Screening Breast Tomo Bilateral  Result Date: 03/03/2016 CLINICAL DATA:  Screening. EXAM: 2D DIGITAL SCREENING BILATERAL MAMMOGRAM WITH CAD AND ADJUNCT TOMO COMPARISON:  Previous exam(s). ACR Breast Density Category a: The breast tissue is almost entirely fatty. FINDINGS: In the left breast, possible distortion and mass warrants further evaluation. In the right breast, no findings suspicious for malignancy. Images were processed with CAD. IMPRESSION: Further evaluation is suggested for possible distortion and mass in the left breast. RECOMMENDATION: Diagnostic mammogram and possibly ultrasound of the left breast. (Code:[id number]) The patient will be contacted regarding the findings, and additional imaging will be scheduled. BI-RADS CATEGORY  0: Incomplete. Need additional imaging evaluation and/or prior mammograms for comparison. Electronically Signed   By: Britta Mccreedy M.D.   On: 03/03/2016 16:22    Assessment and Plan:   Lori Petersen is a 61 y.o. y/o female who comes in with rectal bleeding. The patient has not had a colonoscopy in many years. The patient will be set up for a colonoscopy to look for the source of her hematochezia.I have discussed risks & benefits which include, but are not limited to, bleeding, infection, perforation & drug reaction.  The patient agrees with this plan & written consent will be obtained.       Midge Minium, MD. Clementeen Graham   Note: This dictation was prepared with Dragon dictation along with smaller phrase technology. Any transcriptional  errors that result from this process are unintentional.

## 2016-03-31 ENCOUNTER — Ambulatory Visit (INDEPENDENT_AMBULATORY_CARE_PROVIDER_SITE_OTHER): Payer: Medicare Other | Admitting: Podiatry

## 2016-03-31 DIAGNOSIS — L603 Nail dystrophy: Secondary | ICD-10-CM

## 2016-03-31 DIAGNOSIS — B351 Tinea unguium: Secondary | ICD-10-CM | POA: Diagnosis not present

## 2016-03-31 DIAGNOSIS — M79609 Pain in unspecified limb: Secondary | ICD-10-CM

## 2016-03-31 DIAGNOSIS — L608 Other nail disorders: Secondary | ICD-10-CM

## 2016-04-05 ENCOUNTER — Other Ambulatory Visit: Payer: Self-pay | Admitting: Family Medicine

## 2016-04-05 ENCOUNTER — Ambulatory Visit
Admission: RE | Admit: 2016-04-05 | Discharge: 2016-04-05 | Disposition: A | Discharge status: Home / Self Care | Payer: Medicare Other | Source: Ambulatory Visit | Attending: Family Medicine | Admitting: Family Medicine

## 2016-04-05 DIAGNOSIS — N6320 Unspecified lump in the left breast, unspecified quadrant: Secondary | ICD-10-CM | POA: Diagnosis present

## 2016-04-05 DIAGNOSIS — R928 Other abnormal and inconclusive findings on diagnostic imaging of breast: Secondary | ICD-10-CM

## 2016-04-05 DIAGNOSIS — R590 Localized enlarged lymph nodes: Secondary | ICD-10-CM | POA: Insufficient documentation

## 2016-04-05 DIAGNOSIS — N6032 Fibrosclerosis of left breast: Secondary | ICD-10-CM | POA: Diagnosis not present

## 2016-04-05 HISTORY — PX: BREAST BIOPSY: SHX20

## 2016-04-06 LAB — SURGICAL PATHOLOGY

## 2016-04-10 NOTE — Progress Notes (Signed)
   SUBJECTIVE Patient  presents to office today complaining of elongated, thickened nails. Pain while ambulating in shoes. Patient is unable to trim their own nails.   OBJECTIVE General Patient is awake, alert, and oriented x 3 and in no acute distress. Derm Skin is dry and supple bilateral. Negative open lesions or macerations. Remaining integument unremarkable. Nails are tender, long, thickened and dystrophic with subungual debris, consistent with onychomycosis, 1-5 bilateral. No signs of infection noted. Vasc  DP and PT pedal pulses palpable bilaterally. Temperature gradient within normal limits.  Neuro Epicritic and protective threshold sensation diminished bilaterally.  Musculoskeletal Exam No symptomatic pedal deformities noted bilateral. Muscular strength within normal limits.  ASSESSMENT 1. Onychodystrophic nails 1-5 bilateral with hyperkeratosis of nails.  2. Onychomycosis of nail due to dermatophyte bilateral 3. Pain in foot bilateral  PLAN OF CARE 1. Patient evaluated today.  2. Instructed to maintain good pedal hygiene and foot care.  3. Mechanical debridement of nails 1-5 bilaterally performed using a nail nipper. Filed with dremel without incident.  4. Return to clinic in 3 mos.    Brent M. Evans, DPM [facility]  Dr. Brent M. Evans, DPM    [address]                                        [place], [place] [address]                Office [phone]  Fax [phone]      

## 2016-04-18 ENCOUNTER — Encounter: Payer: Self-pay | Admitting: *Deleted

## 2016-04-21 NOTE — Discharge Instructions (Signed)

## 2016-04-24 ENCOUNTER — Encounter
Admission: RE | Disposition: A | Discharge status: Home / Self Care | Payer: Self-pay | Source: Ambulatory Visit | Attending: Gastroenterology

## 2016-04-24 ENCOUNTER — Ambulatory Visit: Payer: Medicare Other | Admitting: Anesthesiology

## 2016-04-24 ENCOUNTER — Ambulatory Visit
Admission: RE | Admit: 2016-04-24 | Discharge: 2016-04-24 | Disposition: A | Discharge status: Home / Self Care | Payer: Medicare Other | Source: Ambulatory Visit | Attending: Gastroenterology | Admitting: Gastroenterology

## 2016-04-24 DIAGNOSIS — M479 Spondylosis, unspecified: Secondary | ICD-10-CM | POA: Diagnosis not present

## 2016-04-24 DIAGNOSIS — Z79899 Other long term (current) drug therapy: Secondary | ICD-10-CM | POA: Insufficient documentation

## 2016-04-24 DIAGNOSIS — R1310 Dysphagia, unspecified: Secondary | ICD-10-CM

## 2016-04-24 DIAGNOSIS — K921 Melena: Secondary | ICD-10-CM | POA: Diagnosis present

## 2016-04-24 DIAGNOSIS — Q909 Down syndrome, unspecified: Secondary | ICD-10-CM | POA: Diagnosis not present

## 2016-04-24 DIAGNOSIS — K219 Gastro-esophageal reflux disease without esophagitis: Secondary | ICD-10-CM | POA: Insufficient documentation

## 2016-04-24 DIAGNOSIS — Z7982 Long term (current) use of aspirin: Secondary | ICD-10-CM | POA: Diagnosis not present

## 2016-04-24 DIAGNOSIS — Z9981 Dependence on supplemental oxygen: Secondary | ICD-10-CM | POA: Insufficient documentation

## 2016-04-24 DIAGNOSIS — E039 Hypothyroidism, unspecified: Secondary | ICD-10-CM | POA: Insufficient documentation

## 2016-04-24 DIAGNOSIS — Z9889 Other specified postprocedural states: Secondary | ICD-10-CM | POA: Diagnosis not present

## 2016-04-24 DIAGNOSIS — K642 Third degree hemorrhoids: Secondary | ICD-10-CM | POA: Insufficient documentation

## 2016-04-24 DIAGNOSIS — J45909 Unspecified asthma, uncomplicated: Secondary | ICD-10-CM | POA: Insufficient documentation

## 2016-04-24 DIAGNOSIS — Z7951 Long term (current) use of inhaled steroids: Secondary | ICD-10-CM | POA: Diagnosis not present

## 2016-04-24 DIAGNOSIS — I5032 Chronic diastolic (congestive) heart failure: Secondary | ICD-10-CM | POA: Insufficient documentation

## 2016-04-24 DIAGNOSIS — K449 Diaphragmatic hernia without obstruction or gangrene: Secondary | ICD-10-CM | POA: Diagnosis not present

## 2016-04-24 HISTORY — DX: Complete loss of teeth, unspecified cause, unspecified class: K08.109

## 2016-04-24 HISTORY — PX: COLONOSCOPY WITH PROPOFOL: SHX5780

## 2016-04-24 HISTORY — DX: Unspecified osteoarthritis, unspecified site: M19.90

## 2016-04-24 HISTORY — DX: Down syndrome, unspecified: Q90.9

## 2016-04-24 HISTORY — DX: Hypothyroidism, unspecified: E03.9

## 2016-04-24 HISTORY — DX: Gastro-esophageal reflux disease without esophagitis: K21.9

## 2016-04-24 HISTORY — DX: Unspecified diastolic (congestive) heart failure: I50.30

## 2016-04-24 HISTORY — DX: Presence of dental prosthetic device (complete) (partial): Z97.2

## 2016-04-24 HISTORY — PX: ESOPHAGOGASTRODUODENOSCOPY (EGD) WITH PROPOFOL: SHX5813

## 2016-04-24 SURGERY — COLONOSCOPY WITH PROPOFOL
Anesthesia: Monitor Anesthesia Care | Wound class: Contaminated

## 2016-04-24 MED ORDER — GLYCOPYRROLATE 0.2 MG/ML IJ SOLN
INTRAMUSCULAR | Status: DC | PRN
  Administered 2016-04-24: 0.2 mg via INTRAVENOUS

## 2016-04-24 MED ORDER — LACTATED RINGERS IV SOLN
INTRAVENOUS | Status: DC
  Administered 2016-04-24: 10:00:00 via INTRAVENOUS

## 2016-04-24 MED ORDER — PROPOFOL 10 MG/ML IV BOLUS
INTRAVENOUS | Status: DC | PRN
  Administered 2016-04-24: 20 mg via INTRAVENOUS
  Administered 2016-04-24 (×2): 30 mg via INTRAVENOUS
  Administered 2016-04-24: 70 mg via INTRAVENOUS
  Administered 2016-04-24: 20 mg via INTRAVENOUS
  Administered 2016-04-24: 30 mg via INTRAVENOUS

## 2016-04-24 MED ORDER — LIDOCAINE HCL (CARDIAC) 20 MG/ML IV SOLN
INTRAVENOUS | Status: DC | PRN
  Administered 2016-04-24: 40 mg via INTRAVENOUS

## 2016-04-24 MED ORDER — STERILE WATER FOR IRRIGATION IR SOLN
Status: DC | PRN
  Administered 2016-04-24: 10:00:00

## 2016-04-24 SURGICAL SUPPLY — 35 items

## 2016-04-24 NOTE — Op Note (Signed)
[facility] Gastroenterology Patient Name: [facility] Procedure Date: 04/24/2016 10:00 AM MRN: [id number] Account #: [id number] Date of Birth: 26-Jul-1955 Admit Type: Outpatient Age: 61 Room: [facility] OR ROOM 01 Gender: Female Note Status: Finalized Procedure:            Colonoscopy Indications:          Hematochezia Providers:            Midge Minium MD, MD Medicines:            Propofol per Anesthesia Complications:        No immediate complications. Procedure:            Pre-Anesthesia Assessment:                       - Prior to the procedure, a History and Physical was                        performed, and patient medications and allergies were                        reviewed. The patient's tolerance of previous                        anesthesia was also reviewed. The risks and benefits of                        the procedure and the sedation options and risks were                        discussed with the patient. All questions were                        answered, and informed consent was obtained. Prior                        Anticoagulants: The patient has taken no previous                        anticoagulant or antiplatelet agents. ASA Grade                        Assessment: II - A patient with mild systemic disease.                        After reviewing the risks and benefits, the patient was                        deemed in satisfactory condition to undergo the                        procedure.                       After obtaining informed consent, the colonoscope was                        passed under direct vision. Throughout the procedure,                        the patient's blood pressure, pulse, and oxygen  saturations were monitored continuously. The Olympus                        190 Colonoscope [id number]) was introduced through the                        anus and advanced to the the cecum, identified by        appendiceal orifice and ileocecal valve. The                        colonoscopy was performed without difficulty. The                        patient tolerated the procedure well. The quality of                        the bowel preparation was excellent. Findings:      The perianal and digital rectal examinations were normal.      Non-bleeding internal hemorrhoids were found during retroflexion. The       hemorrhoids were Grade III (internal hemorrhoids that prolapse but       require manual reduction).      The exam was otherwise without abnormality. Impression:           - Non-bleeding internal hemorrhoids.                       - The examination was otherwise normal.                       - No specimens collected. Recommendation:       - Discharge patient to home.                       - Resume previous diet.                       - Continue present medications.                       - Repeat colonoscopy in 10 years for screening unless                        any change in family history or lower GI problems. Procedure Code(s):    --- Professional ---                       [phone], Colonoscopy, flexible; diagnostic, including                        collection of specimen(s) by brushing or washing, when                        performed (separate procedure) Diagnosis Code(s):    --- Professional ---                       K92.1, Melena (includes Hematochezia) CPT copyright 2016 American Medical Association. All rights reserved. The codes documented in this report are preliminary and upon coder review may  be revised to meet current compliance requirements. Midge Minium MD, MD 04/24/2016 10:25:34 AM This report has been signed electronically. Number of Addenda: 0  Note Initiated On: 04/24/2016 10:00 AM Scope Withdrawal Time: 0 hours 6 minutes 8 seconds  Total Procedure Duration: 0 hours 7 minutes 38 seconds       [facility]

## 2016-04-24 NOTE — Anesthesia Preprocedure Evaluation (Signed)
Anesthesia Evaluation  Patient identified by MRN, date of birth, ID band Patient awake    Reviewed: Allergy & Precautions, H&P , NPO status , Patient's Chart, lab work & pertinent test results  Airway Mallampati: III  TM Distance: >3 FB Neck ROM: full    Dental  (+) Edentulous Upper, Edentulous Lower   Pulmonary asthma ,    Pulmonary exam normal        Cardiovascular +CHF  Normal cardiovascular exam     Neuro/Psych    GI/Hepatic GERD  ,  Endo/Other  Hypothyroidism   Renal/GU      Musculoskeletal   Abdominal   Peds  Hematology   Anesthesia Other Findings   Reproductive/Obstetrics                             Anesthesia Physical Anesthesia Plan  ASA: III  Anesthesia Plan: MAC   Post-op Pain Management:    Induction:   Airway Management Planned:   Additional Equipment:   Intra-op Plan:   Post-operative Plan:   Informed Consent: I have reviewed the patients History and Physical, chart, labs and discussed the procedure including the risks, benefits and alternatives for the proposed anesthesia with the patient or authorized representative who has indicated his/her understanding and acceptance.     Plan Discussed with:   Anesthesia Plan Comments:         Anesthesia Quick Evaluation

## 2016-04-24 NOTE — H&P (Signed)
Lori Minium, MD [facility] [address] [place], [place] [address] [phone] Fax : [phone]  Primary Care Physician:  [facility] Primary Gastroenterologist:  Dr. Servando Snare  Pre-Procedure History & Physical: HPI:  Lori Petersen is a 61 y.o. female is here for an endoscopy and colonoscopy.   Past Medical History:  Diagnosis Date  . Arthritis    lower spine  . Asthma   . Diastolic congestive heart failure (HCC)    "only has problems with bad colds"  . Down's syndrome   . Full dentures    does not wear  . GERD (gastroesophageal reflux disease)   . Hypothyroidism   . Onychomycosis     Past Surgical History:  Procedure Laterality Date  . HERNIA REPAIR      Prior to Admission medications   Medication Sig Start Date End Date Taking? Authorizing Provider  albuterol (ACCUNEB) 0.63 MG/3ML nebulizer solution Take 1 ampule by nebulization every 6 (six) hours as needed for wheezing.   Yes Historical Provider, MD  aspirin 81 MG tablet Take 81 mg by mouth daily.   Yes Historical Provider, MD  budesonide (PULMICORT) 0.25 MG/2ML nebulizer solution Take 0.25 mg by nebulization 2 (two) times daily as needed.   Yes Historical Provider, MD  donepezil (ARICEPT) 10 MG tablet Take by mouth. 05/07/15 05/06/16 Yes Historical Provider, MD  FUROSEMIDE PO Take by mouth. TAKE ONE TABLET TWICE WEEKLY   Yes Historical Provider, MD  latanoprost (XALATAN) 0.005 % ophthalmic solution  05/07/13  Yes Historical Provider, MD  levothyroxine (SYNTHROID, LEVOTHROID) 112 MCG tablet Take 112 mcg by mouth daily.   Yes Historical Provider, MD  OXYGEN Inhale into the lungs as needed.   Yes Historical Provider, MD  polyethylene glycol (GOLYTELY) 236 g solution Drink one 8 oz glass every 20 mins until stools are clear 03/29/16  Yes Lori Minium, MD  potassium chloride SA (K-DUR,KLOR-CON) 20 MEQ tablet  04/30/13  Yes Historical Provider, MD  pravastatin (PRAVACHOL) 40 MG tablet Take 40 mg by mouth  daily.   Yes Historical Provider, MD  VITAMIN D, CHOLECALCIFEROL, PO Take by mouth. TAKE ONE CAPSULE ONCE WEEKLY   Yes Historical Provider, MD  Fluticasone-Salmeterol (ADVAIR DISKUS) 250-50 MCG/DOSE AEPB Inhale 1 puff into the lungs every 12 (twelve) hours.    Historical Provider, MD    Allergies as of 03/29/2016  . (No Known Allergies)    History reviewed. No pertinent family history.  Social History   Social History  . Marital status: Single    Spouse name: N/A  . Number of children: N/A  . Years of education: N/A   Occupational History  . Not on file.   Social History Main Topics  . Smoking status: Never Smoker  . Smokeless tobacco: Never Used  . Alcohol use No  . Drug use: Unknown  . Sexual activity: Not on file   Other Topics Concern  . Not on file   Social History Narrative  . No narrative on file    Review of Systems: See HPI, otherwise negative ROS  Physical Exam: Ht  (1.6 m)   Wt 204 lb (92.5 kg)   BMI 36.14 kg/m  General:   Alert,  pleasant and cooperative in NAD Head:  Normocephalic and atraumatic. Neck:  Supple; no masses or thyromegaly. Lungs:  Clear throughout to auscultation.    Heart:  Regular rate and rhythm. Abdomen:  Soft, nontender and nondistended. Normal bowel sounds, without guarding, and without rebound.   Neurologic:  Alert and  oriented x4;  grossly normal neurologically.  Impression/Plan: Lyn[facility] is here for an endoscopy and colonoscopy to be performed for hematochezia and dysphagia  Risks, benefits, limitations, and alternatives regarding  endoscopy and colonoscopy have been reviewed with the patient.  Questions have been answered.  All parties agreeable.   Lori Minium, MD  04/24/2016, 9:31 AM

## 2016-04-24 NOTE — Anesthesia Procedure Notes (Signed)
Procedure Name: MAC Date/Time: 04/24/2016 10:03 AM Performed by: Janna Arch Pre-anesthesia Checklist: Patient identified, Emergency Drugs available, Suction available and Patient being monitored Patient Re-evaluated:Patient Re-evaluated prior to inductionOxygen Delivery Method: Nasal cannula

## 2016-04-24 NOTE — Anesthesia Postprocedure Evaluation (Signed)
Anesthesia Post Note  Patient: Lori Petersen  Procedure(s) Performed: Procedure(s) (LRB): COLONOSCOPY WITH PROPOFOL (N/A) ESOPHAGOGASTRODUODENOSCOPY (EGD) WITH PROPOFOL (N/A)  Patient location during evaluation: PACU Anesthesia Type: MAC Level of consciousness: awake and alert and oriented Pain management: satisfactory to patient Vital Signs Assessment: post-procedure vital signs reviewed and stable Respiratory status: spontaneous breathing, nonlabored ventilation and respiratory function stable Cardiovascular status: blood pressure returned to baseline and stable Postop Assessment: Adequate PO intake and No signs of nausea or vomiting Anesthetic complications: no    Raliegh Ip

## 2016-04-24 NOTE — Transfer of Care (Signed)
Immediate Anesthesia Transfer of Care Note  Patient: Lori Petersen  Procedure(s) Performed: Procedure(s) with comments: COLONOSCOPY WITH PROPOFOL (N/A) - Leave patient at 10:25 due to transportation ESOPHAGOGASTRODUODENOSCOPY (EGD) WITH PROPOFOL (N/A)  Patient Location: PACU  Anesthesia Type: MAC  Level of Consciousness: awake, alert  and patient cooperative  Airway and Oxygen Therapy: Patient Spontanous Breathing and Patient connected to supplemental oxygen  Post-op Assessment: Post-op Vital signs reviewed, Patient's Cardiovascular Status Stable, Respiratory Function Stable, Patent Airway and No signs of Nausea or vomiting  Post-op Vital Signs: Reviewed and stable  Complications: No apparent anesthesia complications

## 2016-04-24 NOTE — Op Note (Signed)
[facility] Patient Name: Lori [facility] Procedure Date: 04/24/2016 10:00 AM MRN: [id number] Account #: [id number] Date of Birth: 03-Aug-1955 Admit Type: Outpatient Age: 61 Room: [facility] OR ROOM 01 Gender: Female Note Status: Finalized Procedure:            Upper GI endoscopy Indications:          Dysphagia Providers:            Midge Minium MD, MD Referring MD:         [facility] (Referring MD) Medicines:            Propofol per Anesthesia Complications:        No immediate complications. Procedure:            Pre-Anesthesia Assessment:                       - Prior to the procedure, a History and Physical was                        performed, and patient medications and allergies were                        reviewed. The patient's tolerance of previous                        anesthesia was also reviewed. The risks and benefits of                        the procedure and the sedation options and risks were                        discussed with the patient. All questions were                        answered, and informed consent was obtained. Prior                        Anticoagulants: The patient has taken no previous                        anticoagulant or antiplatelet agents. ASA Grade                        Assessment: II - A patient with mild systemic disease.                        After reviewing the risks and benefits, the patient was                        deemed in satisfactory condition to undergo the                        procedure.                       After obtaining informed consent, the endoscope was                        passed under direct vision. Throughout the procedure,  the patient's blood pressure, pulse, and oxygen                        saturations were monitored continuously. The Olympus                        GIF-HQ190 Endoscope (S#. [id number]) was introduced   through the mouth, and advanced to the second part of                        duodenum. The upper GI endoscopy was accomplished                        without difficulty. The patient tolerated the procedure                        well. Findings:      A small hiatal hernia was present.      The entire examined stomach was normal.      The examined duodenum was normal. Impression:           - Small hiatal hernia.                       - Normal stomach.                       - Normal examined duodenum.                       - No specimens collected. Recommendation:       - Discharge patient to home.                       - Resume previous diet.                       - Continue present medications.                       - Perform a colonoscopy today. Procedure Code(s):    --- Professional ---                       [phone], Esophagogastroduodenoscopy, flexible, transoral;                        diagnostic, including collection of specimen(s) by                        brushing or washing, when performed (separate procedure) Diagnosis Code(s):    --- Professional ---                       R13.10, Dysphagia, unspecified CPT copyright 2016 American Medical Association. All rights reserved. The codes documented in this report are preliminary and upon coder review may  be revised to meet current compliance requirements. Midge Minium MD, MD 04/24/2016 10:13:34 AM This report has been signed electronically. Number of Addenda: 0 Note Initiated On: 04/24/2016 10:00 AM      [facility]

## 2016-04-25 ENCOUNTER — Encounter: Payer: Self-pay | Admitting: Gastroenterology

## 2016-04-27 ENCOUNTER — Other Ambulatory Visit: Payer: Self-pay

## 2016-04-27 ENCOUNTER — Telehealth: Payer: Self-pay

## 2016-04-27 MED ORDER — HYDROCORTISONE ACETATE 25 MG RE SUPP: 25.0000 mg | Freq: Two times a day (BID) | RECTAL | 0 refills | Status: DC

## 2016-04-27 NOTE — Telephone Encounter (Signed)
Pt's sister, Dot notified of rx for Hydrocortisone suppositories. Sent to [facility] pharmacy per her request.

## 2016-04-27 NOTE — Telephone Encounter (Signed)
Yes please have the patient start Anusol suppositories twice a day. If bleeding continues she should be set up with surgery for evaluation.

## 2016-04-27 NOTE — Telephone Encounter (Signed)
Pt had a colonoscopy on 04/24/16. Exam was normal. Nonbleeding internal hemorrhoids noted. Sister called this morning stating pt is having rectal bleeding. No abdominal pain. No other symptoms. Please advise? Should we send her some suppositories to her pharmacy?

## 2016-05-02 ENCOUNTER — Other Ambulatory Visit: Payer: Self-pay

## 2016-05-03 ENCOUNTER — Other Ambulatory Visit: Payer: Self-pay

## 2016-07-03 ENCOUNTER — Ambulatory Visit (INDEPENDENT_AMBULATORY_CARE_PROVIDER_SITE_OTHER): Payer: Medicare Other | Admitting: Podiatry

## 2016-07-03 DIAGNOSIS — M79609 Pain in unspecified limb: Secondary | ICD-10-CM

## 2016-07-03 DIAGNOSIS — B351 Tinea unguium: Secondary | ICD-10-CM

## 2016-07-03 NOTE — Progress Notes (Signed)
Complaint:  Visit Type: Patient returns to my office for continued preventative foot care services. Complaint: Patient states" my nails have grown long and thick and become painful to walk and wear shoes" . The patient presents for preventative foot care services. No changes to ROS  Podiatric Exam: Vascular: dorsalis pedis and posterior tibial pulses are palpable bilateral. Capillary return is immediate. Temperature gradient is WNL. Skin turgor WNL  Sensorium: Normal Semmes Weinstein monofilament test. Normal tactile sensation bilaterally. Nail Exam: Pt has thick disfigured discolored nails with subungual debris noted bilateral entire nail hallux through fifth toenails Ulcer Exam: There is no evidence of ulcer or pre-ulcerative changes or infection. Orthopedic Exam: Muscle tone and strength are WNL. No limitations in general ROM. No crepitus or effusions noted. Foot type and digits show no abnormalities. Bony prominences are unremarkable. Skin: No Porokeratosis. No infection or ulcers  Diagnosis:  Onychomycosis, , Pain in right toe, pain in left toes  Treatment & Plan Procedures and Treatment: Consent by patient was obtained for treatment procedures. The patient understood the discussion of treatment and procedures well. All questions were answered thoroughly reviewed. Debridement of mycotic and hypertrophic toenails, 1 through 5 bilateral and clearing of subungual debris. No ulceration, no infection noted.  Return Visit-Office Procedure: Patient instructed to return to the office for a follow up visit 3 months   for continued evaluation and treatment.    Wendee Hata DPM 

## 2016-08-30 ENCOUNTER — Other Ambulatory Visit: Payer: Self-pay | Admitting: Family Medicine

## 2016-08-30 DIAGNOSIS — R928 Other abnormal and inconclusive findings on diagnostic imaging of breast: Secondary | ICD-10-CM

## 2016-09-25 ENCOUNTER — Ambulatory Visit: Payer: Medicare Other | Admitting: Speech Pathology

## 2016-09-25 ENCOUNTER — Ambulatory Visit: Payer: Medicare Other | Attending: Family Medicine | Admitting: Physical Therapy

## 2016-09-25 ENCOUNTER — Encounter: Payer: Self-pay | Admitting: Physical Therapy

## 2016-09-25 DIAGNOSIS — R471 Dysarthria and anarthria: Secondary | ICD-10-CM

## 2016-09-25 DIAGNOSIS — R262 Difficulty in walking, not elsewhere classified: Secondary | ICD-10-CM

## 2016-09-25 DIAGNOSIS — R2681 Unsteadiness on feet: Secondary | ICD-10-CM | POA: Diagnosis present

## 2016-09-25 NOTE — Therapy (Signed)
[facility] MAIN [facility] SERVICES [address] [place], [place], [address] Phone: [phone]   Fax:  [phone]  Physical Therapy Evaluation  Patient Details  Name: Lori [facility] MRN: [id number] Date of Birth: December 17, 1955 Referring Provider: Hillery Aldo [id number] [place], [place] [address])  Encounter Date: 09/25/2016      PT End of Session - 09/25/16 0911    Visit Number 1   Number of Visits 17   Date for PT Re-Evaluation Nov 20, 2017   Authorization Type g codes 1/10   PT Start Time 0850   PT Stop Time 0940   PT Time Calculation (min) 50 min   Equipment Utilized During Treatment Gait belt   Activity Tolerance Patient tolerated treatment well   Behavior During Therapy [facility] for tasks assessed/performed      Past Medical History:  Diagnosis Date  . Arthritis    lower spine  . Asthma   . Diastolic congestive heart failure (HCC)    "only has problems with bad colds"  . Down's syndrome   . Full dentures    does not wear  . GERD (gastroesophageal reflux disease)   . Hypothyroidism   . Onychomycosis     Past Surgical History:  Procedure Laterality Date  . COLONOSCOPY WITH PROPOFOL N/A 04/24/2016   Procedure: COLONOSCOPY WITH PROPOFOL;  Surgeon: Midge Minium, MD;  Location: [facility];  Service: Endoscopy;  Laterality: N/A;  Leave patient at 10:25 due to transportation  . ESOPHAGOGASTRODUODENOSCOPY (EGD) WITH PROPOFOL N/A 04/24/2016   Procedure: ESOPHAGOGASTRODUODENOSCOPY (EGD) WITH PROPOFOL;  Surgeon: Midge Minium, MD;  Location: [facility];  Service: Endoscopy;  Laterality: N/A;  . HERNIA REPAIR      There were no vitals filed for this visit.       Subjective Assessment - 09/25/16 0858    Subjective Pt sister states that she has trouble with walking, especially when walking at faster speeds.  Pt is able to walk on her own, but requires cues from caregiver for safety and to decrease risk of falls.  Pt has started using a walker  in the last week due to a recent fall last week.  Pt and sister states that she is able to walk around home without AD.   Patient is accompained by: Family member   Pertinent History Pt has had 2 witnessed falls in the last 6 months due to tripping over her feet while walking.  Pt has been walking without an AD until last week - sister provided a rolling walker due to a recent fall.  Pt has an aid that stays with her while sister is out of house.  Pt typically has someone at home with her,however stays by herself on some occasions.   Limitations Walking   How long can you sit comfortably? unlimited    How long can you stand comfortably? unlimited    How long can you walk comfortably? >1 hour    Patient Stated Goals walk without help and decrease falls   Currently in Pain? No/denies   Multiple Pain Sites No            [facility] PT Assessment - 09/25/16 0905      Assessment   Medical Diagnosis Gait and Balance    Referring Provider Hillery Aldo [id number]  Hillery Aldo [id number]   Hand Dominance Right   Next MD Visit November   Prior Therapy yes     Precautions   Precautions Fall     Restrictions  Weight Bearing Restrictions No     Balance Screen   Has the patient fallen in the past 6 months Yes   How many times? 2   Has the patient had a decrease in activity level because of a fear of falling?  Yes   Is the patient reluctant to leave their home because of a fear of falling?  Yes     Home Environment   Living Environment Private residence   Living Arrangements Other relatives   Available Help at Discharge Family   Type of Home House   Home Access Ramped entrance   Home Layout One level   Home Equipment Walker - 2 wheels;Shower seat;Grab bars - tub/shower     Prior Function   Level of Independence Independent with basic ADLs;Independent with community mobility with device   Leisure color, watch TV     ROM:  WFL's - decreased secondary to weakness in B UE's and LE's  B UE  flexion and ABD limited to 90 degrees  Strength: R Hip Flex: 3/5 L Hip Flex: 3/5 Hip ABD in sitting 3+/5 Unable to follow commands for knee extension, knee flexion, and DF strength testing.  Gait Speed: 10 m walk test 23.33 seconds - 0.428 m/s  Gait Pattern: Pt has wide BOS with ambulation, decreased heel strike on B LE's, decreased knee flexion with swing phase on B LE's, increased IR and toe in pattern with gait pattern. Patient has decreased safety during turns.   Transfers: Patient needs cues for correct hand placement sit to stand and stand to sit transfers with RW.   Balance:  Static Standing Balance  Normal Able to maintain standing balance against maximal resistance   Good Able to maintain standing balance against moderate resistance   Good-/Fair+ Able to maintain standing balance against minimal resistance   Fair Able to stand unsupported without UE support and without LOB for 1-2 min x  Fair- Requires Min A and UE support to maintain standing without loss of balance   Poor+ Requires mod A and UE support to maintain standing without loss of balance   Poor Requires max A and UE support to maintain standing balance without loss    Standing Dynamic Balance  Normal Stand independently unsupported, able to weight shift and cross midline maximally   Good Stand independently unsupported, able to weight shift and cross midline moderately   Good-/Fair+ Stand independently unsupported, able to weight shift across midline minimally   Fair Stand independently unsupported, weight shift, and reach ipsilaterally, loss of balance when crossing midline x  Poor+ Able to stand with Min A and reach ipsilaterally, unable to weight shift   Poor Able to stand with Mod A and minimally reach ipsilaterally, unable to cross midline.       Outcome Measures:  TUG: 35 seconds using rolling walker 5x Sit to Stand: 40 seconds using B UE's to push off from chair        Objective measurements  completed on examination: See above findings.                  PT Education - 09/25/16 0910    Education provided Yes   Education Details PT plan of care, safety with transfers and sit to stand   Person(s) Educated Patient;Caregiver(s)   Methods Explanation   Comprehension Need further instruction          PT Short Term Goals - 09/25/16 0949      PT SHORT TERM GOAL #1  Title Pt will independent with home exercise program to be able to improve her falls risk.   Time 4   Period Weeks   Status New   Target Date 10/23/16     PT SHORT TERM GOAL #2   Title Pt will increase strength in B LE's to 3+/5 in all limited planes in order to increase ambulation distances using AD with increased safety and decreased risk of falls.   Baseline LE strength 3/5   Time 4   Period Weeks   Status New   Target Date 10/23/16     PT SHORT TERM GOAL #3   Title Pt will improve TUG to <20 seconds in order to decrease risk of falls   Baseline 35 seconds    Time 4   Period Weeks   Status New   Target Date 10/23/16     PT SHORT TERM GOAL #4   Title Pt will improve 5 time sit to stand to <30 seconds in order to decrease falls risk   Baseline 40 seconds   Time 4   Period Weeks   Status New   Target Date 10/23/16           PT Long Term Goals - 09/25/16 0955      PT LONG TERM GOAL #1   Title Patient will increase B LE strength to 4-/5 in all limited planes in order to increase ambulation distance using AD with decreased risk of falls and increased safety awareness.   Baseline 3/5   Time 8   Period Weeks   Status New   Target Date 11/20/16     PT LONG TERM GOAL #2   Title Pt will improve TUG score to <15 seconds in order to decrease risk of falls indicating an increase in LE strength and improved dynamic balance   Baseline 35 seconds   Time 8   Period Weeks   Status New   Target Date 11/20/16     PT LONG TERM GOAL #3   Title Pt will improve 5 time sit to stand to <20  seconds in order to demonstrate increase in B LE strength and improvements in dynamic balance ability   Baseline 40 seconds   Time 8   Period Weeks   Status New   Target Date 11/20/16     PT LONG TERM GOAL #4   Title Pt will improve 51M walk test score demonstrating an increase in gait speed to at least 0.75 m/s in order to facilitate increased safety with ambulation and increase functional abilities within community.   Baseline 0.428 m/s   Time 8   Period Weeks   Status New   Target Date 11/20/16     PT LONG TERM GOAL #5   Title Pt will improve dynamic standing balance to a score of Fair plus in order to decrease risk of falls and improve safety with ADL's.   Baseline Fair minus   Time 8   Period Weeks   Status New   Target Date 11/20/16                Plan - 09/25/16 1008    Clinical Impression Statement Pt is a 61 yo female with PT order for gait and balance. She presents with decreased strength, decreased static and dynamic balance, and decreased safety awareness with transfers.  Pt is able to follow one step commands with verbal, visual and tactile cues.  Pt needs increased time to process instructions and increased  instruction with new activities. Pt ambulates w/rolling walker requires CGA for safety, especially with turns.  Pt has decreased gait speed .She will benefit from skilled PT to improve safety and mobility and decrease falls risk.   History and Personal Factors relevant to plan of care: comorbidities, decreased ability to follow commands, fear of falling   Clinical Presentation Evolving   Clinical Presentation due to: cognition, unpredictable falls   Clinical Decision Making Moderate   Rehab Potential Good   Clinical Impairments Affecting Rehab Potential decreased strength, decreased balance   PT Frequency 2x / week   PT Duration 8 weeks   PT Treatment/Interventions Cryotherapy;Moist Heat;Gait training;Stair training;Therapeutic activities;Therapeutic  exercise;Balance training;Neuromuscular re-education   PT Next Visit Plan balance training, gait training w/AD   Consulted and Agree with Plan of Care Patient;Family member/caregiver   Family Member Consulted sister      Patient will benefit from skilled therapeutic intervention in order to improve the following deficits and impairments:  Abnormal gait, Decreased endurance, Decreased balance, Decreased strength  Visit Diagnosis: Difficulty in walking, not elsewhere classified  Unsteadiness on feet      G-Codes - 09/25/16 1112    Functional Assessment Tool Used (Outpatient Only) 5 x sit to stand, 10 MW, TUG   Functional Limitation Mobility: Walking and moving around   Mobility: Walking and Moving Around Current Status ([id number]) At least 40 percent but less than 60 percent impaired, limited or restricted   Mobility: Walking and Moving Around Goal Status [phone](G8979) At least 20 percent but less than 40 percent impaired, limited or restricted       Problem List Patient Active Problem List   Diagnosis Date Noted  . Problems with swallowing and mastication   . Blood in stool   . Bradycardia 05/14/2015  . 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 Onychomycosis   This entire session was performed under direct supervision and direction of a licensed therapist/therapist assistant . I have personally read, edited and approve of the note as written. Stacey DrainSarah E. Laporshia Hogen, SPT  Ezekiel InaMansfield, Kristine S, PT DPT 09/25/2016, 11:21 AM  [facility] [address] [place], [place], [address] Phone: [phone]   Fax:  [phone]  [facility] MRN: [id number] Date of Birth: 10/24/1955

## 2016-09-26 ENCOUNTER — Encounter: Payer: Self-pay | Admitting: Speech Pathology

## 2016-09-26 NOTE — Therapy (Signed)
[facility] [address] [place], [place], [address] Phone: [phone]   Fax:  [phone]  Speech Language Pathology Evaluation  Patient Details  [facility] MR[facility]: [id number] Date of Birth: 1955/07/12 Referring Provider: Hillery AldoPATEL, SARAH   Encounter Date: 09/25/2016      End of Session - 09/26/16 1417    Visit Number 1   Number of Visits 5   Date for SLP Re-Evaluation 10/27/16   SLP Start Time 1000   SLP Stop Time  1055   SLP Time Calculation (min) 55 min   Activity Tolerance Patient tolerated treatment well      Past Medical History:  Diagnosis Date  . Arthritis    lower spine  . Asthma   . Diastolic congestive heart failure (HCC)    "only has problems with bad colds"  . Down's syndrome   . Full dentures    does not wear  . GERD (gastroesophageal reflux disease)   . Hypothyroidism   . Onychomycosis     Past Surgical History:  Procedure Laterality Date  . COLONOSCOPY WITH PROPOFOL N/A 04/24/2016   Procedure: COLONOSCOPY WITH PROPOFOL;  Surgeon: Midge Miniumarren Wohl, MD;  [facility];  Service: Endoscopy;  Laterality: N/A;  Leave patient at 10:25 due to transportation  . ESOPHAGOGASTRODUODENOSCOPY (EGD) WITH PROPOFOL N/A 04/24/2016   Procedure: ESOPHAGOGASTRODUODENOSCOPY (EGD) WITH PROPOFOL;  Surgeon: Midge Miniumarren Wohl, MD;  [facility];  Service: Endoscopy;  Laterality: N/A;  . HERNIA REPAIR      There were no vitals filed for this visit.          SLP Evaluation [facility] - 09/26/16 0001      SLP Visit Information   SLP Received On 09/25/16   Referring Provider Hillery AldoPATEL, SARAH    Onset Date 08/28/2016   Medical Diagnosis Speech articulation disorder     Subjective   Subjective The patient is not able to communicate her own complaints/concerns.     Patient/Family Stated Goal Maximize functional communication     Pain Assessment   Currently in Pain? No/denies     General  Information   HPI 61 year old woman with worsening clarity of speech.  Long history of speech issues from Down's syndrome- now worsened by dementia.  Sister/caregiver feels she has more issues with communication now.     Prior Functional Status   Cognitive/Linguistic Baseline Baseline deficits   Baseline deficit details Down's syndrome, dementia     Oral Motor/Sensory Function   Overall Oral Motor/Sensory Function Impaired at baseline   Overall Oral Motor/Sensory Function Moderate dysarthria  Reduced ROM, strength, and coordination of muscles of articu     Motor Speech   Overall Motor Speech Impaired at baseline   Articulation Impaired   Level of Impairment Word   Intelligibility Intelligibility reduced   Word 0-24% accurate  improves given modeled over-articulation            SLP Education - 09/26/16 1416    Education provided Yes   Education Details SLP plan of care   Person(s) Educated Patient;Caregiver(s)   Methods Explanation   Comprehension Verbalized understanding            SLP Long Term Goals - 09/26/16 1420      SLP LONG TERM GOAL #1   Title The patient and caregiver with participate in development of home exercise program to maximize functional communication   Time 4   Period Weeks   Status  New   Target Date 10/27/16          Plan - October 25, 2016 1418    Clinical Impression Statement This 61 year old woman, with Down's syndrome with dementia, is presenting with moderate dysarthria of speech characterized by imprecise articulation.  The patient's speech difficulty is aggravated by behavioral/cognitive/intellectual challenges.  The patient is stimulable for increased articulatory movements, with improved intelligibility, given max cues/models.  The patient will benefit from 3-4 sessions for home exercise program and patient/caregiver education to stimulate and maximize communication.   Speech Therapy Frequency 1x /week   Duration 4 weeks    Treatment/Interventions Oral motor exercises;SLP instruction and feedback;Patient/family education   Potential to Achieve Goals Fair   Potential Considerations Ability to learn/carryover information;Co-morbidities;Severity of impairments   SLP Home Exercise Plan Wide open vowels; develop list of 15-20 words patient frequently says; model speech using exaggerated oral movements   Consulted and Agree with Plan of Care Family member/caregiver   Family Member Consulted Sister/caregiver      Patient will benefit from skilled therapeutic intervention in order to improve the following deficits and impairments:   Dysarthria and anarthria - Plan: SLP plan of care cert/re-cert      G-Codes - 10/25/2016 1436    Functional Assessment Tool Used Clinical judgment   Functional Limitations Motor speech   Motor Speech Current Status [phone]) At least 80 percent but less than 100 percent impaired, limited or restricted   Motor Speech Goal Status ([id number]) At least 40 percent but less than 60 percent impaired, limited or restricted      Problem List Patient Active Problem List   Diagnosis Date Noted  . Problems with swallowing and mastication   . Blood in stool   . Bradycardia 05/14/2015  . 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 Onychomycosis    Lori Primrose, MS/CCC- SLP  Leandrew Koyanagi 10-25-16, 2:39 PM  [facility] MAIN [facility] SERVICES [address] [place], [place], [address] Phone: [phone]   Fax:  [phone]  Name: Lori [facility] MRN: [id number] Date of Birth: Nov 08, 1955

## 2016-10-02 ENCOUNTER — Ambulatory Visit: Payer: Medicare Other | Admitting: Physical Therapy

## 2016-10-02 ENCOUNTER — Ambulatory Visit: Payer: Medicare Other | Admitting: Speech Pathology

## 2016-10-04 ENCOUNTER — Ambulatory Visit: Payer: Medicare Other | Admitting: Physical Therapy

## 2016-10-04 ENCOUNTER — Ambulatory Visit: Payer: Medicare Other | Admitting: Speech Pathology

## 2016-10-04 ENCOUNTER — Encounter: Payer: Medicare Other | Admitting: Speech Pathology

## 2016-10-09 ENCOUNTER — Ambulatory Visit: Payer: Medicare Other | Admitting: Physical Therapy

## 2016-10-09 ENCOUNTER — Ambulatory Visit: Payer: Medicare Other | Admitting: Speech Pathology

## 2016-10-09 ENCOUNTER — Ambulatory Visit (INDEPENDENT_AMBULATORY_CARE_PROVIDER_SITE_OTHER): Payer: Medicare Other | Admitting: Podiatry

## 2016-10-09 DIAGNOSIS — B351 Tinea unguium: Secondary | ICD-10-CM | POA: Diagnosis not present

## 2016-10-09 DIAGNOSIS — M79609 Pain in unspecified limb: Secondary | ICD-10-CM | POA: Diagnosis not present

## 2016-10-09 NOTE — Progress Notes (Signed)
Complaint:  Visit Type: Patient returns to my office for continued preventative foot care services. Complaint: Patient states" my nails have grown long and thick and become painful to walk and wear shoes" . The patient presents for preventative foot care services. No changes to ROS  Podiatric Exam: Vascular: dorsalis pedis and posterior tibial pulses are palpable bilateral. Capillary return is immediate. Temperature gradient is WNL. Skin turgor WNL  Sensorium: Normal Semmes Weinstein monofilament test. Normal tactile sensation bilaterally. Nail Exam: Pt has thick disfigured discolored nails with subungual debris noted bilateral entire nail hallux through fifth toenails Ulcer Exam: There is no evidence of ulcer or pre-ulcerative changes or infection. Orthopedic Exam: Muscle tone and strength are WNL. No limitations in general ROM. No crepitus or effusions noted. Foot type and digits show no abnormalities. Bony prominences are unremarkable. Skin: No Porokeratosis. No infection or ulcers  Diagnosis:  Onychomycosis, , Pain in right toe, pain in left toes  Treatment & Plan Procedures and Treatment: Consent by patient was obtained for treatment procedures. The patient understood the discussion of treatment and procedures well. All questions were answered thoroughly reviewed. Debridement of mycotic and hypertrophic toenails, 1 through 5 bilateral and clearing of subungual debris. No ulceration, no infection noted.  Return Visit-Office Procedure: Patient instructed to return to the office for a follow up visit 3 months   for continued evaluation and treatment.    Adajah Cocking DPM 

## 2016-10-10 ENCOUNTER — Other Ambulatory Visit: Payer: Medicare Other

## 2016-10-11 ENCOUNTER — Ambulatory Visit: Payer: Medicare Other | Admitting: Speech Pathology

## 2016-10-11 ENCOUNTER — Ambulatory Visit: Payer: Medicare Other | Admitting: Physical Therapy

## 2016-10-16 ENCOUNTER — Ambulatory Visit: Payer: Medicare Other | Admitting: Speech Pathology

## 2016-10-16 ENCOUNTER — Ambulatory Visit: Payer: Medicare Other | Admitting: Physical Therapy

## 2016-10-17 ENCOUNTER — Ambulatory Visit: Payer: Medicare Other | Admitting: Gastroenterology

## 2016-10-18 ENCOUNTER — Encounter: Payer: Medicare Other | Admitting: Speech Pathology

## 2016-10-18 ENCOUNTER — Ambulatory Visit: Payer: Medicare Other | Admitting: Physical Therapy

## 2016-10-20 ENCOUNTER — Other Ambulatory Visit: Payer: Medicare Other

## 2016-10-23 ENCOUNTER — Ambulatory Visit: Payer: Medicare Other | Admitting: Physical Therapy

## 2016-10-23 ENCOUNTER — Encounter: Payer: Medicare Other | Admitting: Speech Pathology

## 2016-10-25 ENCOUNTER — Encounter: Payer: Medicare Other | Admitting: Speech Pathology

## 2016-10-25 ENCOUNTER — Ambulatory Visit: Payer: Medicare Other | Admitting: Physical Therapy

## 2016-10-30 ENCOUNTER — Ambulatory Visit: Payer: Medicare Other | Admitting: Physical Therapy

## 2016-10-31 ENCOUNTER — Telehealth: Payer: Self-pay | Admitting: Gastroenterology

## 2016-10-31 NOTE — Telephone Encounter (Signed)
Per patients sister, Dot, she wants to cancel this appt due to no transportation. She stated Corrie Dandy hasn't had any more bleeding and doesn't want to reschedule. She will call us back if needed.

## 2016-11-01 ENCOUNTER — Ambulatory Visit: Payer: Medicare Other | Admitting: Gastroenterology

## 2016-11-06 ENCOUNTER — Ambulatory Visit: Payer: Medicare Other | Admitting: Physical Therapy

## 2016-11-08 ENCOUNTER — Ambulatory Visit: Payer: Medicare Other | Admitting: Physical Therapy

## 2016-11-13 ENCOUNTER — Ambulatory Visit: Payer: Medicare Other | Admitting: Physical Therapy

## 2016-11-15 ENCOUNTER — Ambulatory Visit: Payer: Medicare Other | Admitting: Physical Therapy

## 2016-11-22 ENCOUNTER — Ambulatory Visit
Admission: RE | Admit: 2016-11-22 | Discharge: 2016-11-22 | Disposition: A | Discharge status: Home / Self Care | Payer: Medicare Other | Source: Ambulatory Visit | Attending: Family Medicine | Admitting: Family Medicine

## 2016-11-22 DIAGNOSIS — R928 Other abnormal and inconclusive findings on diagnostic imaging of breast: Secondary | ICD-10-CM | POA: Insufficient documentation

## 2016-11-22 DIAGNOSIS — N6489 Other specified disorders of breast: Secondary | ICD-10-CM | POA: Diagnosis not present

## 2016-11-22 DIAGNOSIS — N6321 Unspecified lump in the left breast, upper outer quadrant: Secondary | ICD-10-CM | POA: Insufficient documentation

## 2016-11-23 ENCOUNTER — Other Ambulatory Visit: Payer: Self-pay | Admitting: Family Medicine

## 2016-11-23 DIAGNOSIS — R928 Other abnormal and inconclusive findings on diagnostic imaging of breast: Secondary | ICD-10-CM

## 2016-11-23 DIAGNOSIS — N6320 Unspecified lump in the left breast, unspecified quadrant: Secondary | ICD-10-CM

## 2016-12-06 ENCOUNTER — Ambulatory Visit
Admission: RE | Admit: 2016-12-06 | Discharge: 2016-12-06 | Disposition: A | Discharge status: Home / Self Care | Payer: Medicare Other | Source: Ambulatory Visit | Attending: Family Medicine | Admitting: Family Medicine

## 2016-12-06 DIAGNOSIS — N6032 Fibrosclerosis of left breast: Secondary | ICD-10-CM | POA: Diagnosis not present

## 2016-12-06 DIAGNOSIS — R928 Other abnormal and inconclusive findings on diagnostic imaging of breast: Secondary | ICD-10-CM

## 2016-12-06 DIAGNOSIS — N6321 Unspecified lump in the left breast, upper outer quadrant: Secondary | ICD-10-CM | POA: Diagnosis not present

## 2016-12-06 DIAGNOSIS — N6320 Unspecified lump in the left breast, unspecified quadrant: Secondary | ICD-10-CM

## 2016-12-06 HISTORY — PX: BREAST BIOPSY: SHX20

## 2016-12-08 LAB — SURGICAL PATHOLOGY

## 2017-01-08 ENCOUNTER — Ambulatory Visit: Payer: Medicare Other | Admitting: Podiatry

## 2017-01-29 ENCOUNTER — Ambulatory Visit (INDEPENDENT_AMBULATORY_CARE_PROVIDER_SITE_OTHER): Payer: Medicare Other | Admitting: Podiatry

## 2017-01-29 ENCOUNTER — Encounter: Payer: Self-pay | Admitting: Podiatry

## 2017-01-29 DIAGNOSIS — M79676 Pain in unspecified toe(s): Secondary | ICD-10-CM

## 2017-01-29 DIAGNOSIS — M79609 Pain in unspecified limb: Principal | ICD-10-CM

## 2017-01-29 DIAGNOSIS — B351 Tinea unguium: Secondary | ICD-10-CM

## 2017-01-29 NOTE — Progress Notes (Addendum)
Complaint:  Visit Type: Patient returns to my office for continued preventative foot care services. Complaint: Patient states" my nails have grown long and thick and become painful to walk and wear shoes"  The patient presents for preventative foot care services. No changes to ROS  Podiatric Exam: Vascular: dorsalis pedis and posterior tibial pulses are palpable bilateral. Capillary return is immediate. Temperature gradient is WNL. Skin turgor WNL  Sensorium: Normal Semmes Weinstein monofilament test. Normal tactile sensation bilaterally. Nail Exam: Pt has thick disfigured discolored nails with subungual debris noted bilateral entire nail hallux through fifth toenails Ulcer Exam: There is no evidence of ulcer or pre-ulcerative changes or infection. Orthopedic Exam: Muscle tone and strength are WNL. No limitations in general ROM. No crepitus or effusions noted. Foot type and digits show no abnormalities. Bony prominences are unremarkable. Skin: No Porokeratosis. No infection or ulcers  Diagnosis:  Onychomycosis, , Pain in right toe, pain in left toes  Treatment & Plan Procedures and Treatment: Consent by patient was obtained for treatment procedures. The patient understood the discussion of treatment and procedures well. All questions were answered thoroughly reviewed. Debridement of mycotic and hypertrophic toenails, 1 through 5 bilateral and clearing of subungual debris. No ulceration, no infection noted. ABN signed for 2019. Return Visit-Office Procedure: Patient instructed to return to the office for a follow up visit 3 months for continued evaluation and treatment.    Marg Macmaster DPM 

## 2017-04-27 ENCOUNTER — Other Ambulatory Visit: Payer: Self-pay

## 2017-04-27 ENCOUNTER — Inpatient Hospital Stay
Admission: EM | Admit: 2017-04-27 | Discharge: 2017-04-30 | DRG: 563 | Disposition: A | Discharge status: Skilled Nursing Facility | Payer: Medicare Other | Attending: Internal Medicine | Admitting: Internal Medicine

## 2017-04-27 ENCOUNTER — Emergency Department: Payer: Medicare Other

## 2017-04-27 ENCOUNTER — Encounter: Payer: Self-pay | Admitting: Emergency Medicine

## 2017-04-27 DIAGNOSIS — W19XXXA Unspecified fall, initial encounter: Secondary | ICD-10-CM | POA: Diagnosis present

## 2017-04-27 DIAGNOSIS — J45909 Unspecified asthma, uncomplicated: Secondary | ICD-10-CM | POA: Diagnosis present

## 2017-04-27 DIAGNOSIS — M479 Spondylosis, unspecified: Secondary | ICD-10-CM | POA: Diagnosis present

## 2017-04-27 DIAGNOSIS — E785 Hyperlipidemia, unspecified: Secondary | ICD-10-CM | POA: Diagnosis present

## 2017-04-27 DIAGNOSIS — E039 Hypothyroidism, unspecified: Secondary | ICD-10-CM | POA: Diagnosis present

## 2017-04-27 DIAGNOSIS — Z7982 Long term (current) use of aspirin: Secondary | ICD-10-CM | POA: Diagnosis not present

## 2017-04-27 DIAGNOSIS — I5032 Chronic diastolic (congestive) heart failure: Secondary | ICD-10-CM | POA: Diagnosis present

## 2017-04-27 DIAGNOSIS — F0390 Unspecified dementia without behavioral disturbance: Secondary | ICD-10-CM | POA: Diagnosis present

## 2017-04-27 DIAGNOSIS — S82141A Displaced bicondylar fracture of right tibia, initial encounter for closed fracture: Secondary | ICD-10-CM

## 2017-04-27 DIAGNOSIS — Q909 Down syndrome, unspecified: Secondary | ICD-10-CM

## 2017-04-27 DIAGNOSIS — S82191A Other fracture of upper end of right tibia, initial encounter for closed fracture: Secondary | ICD-10-CM | POA: Diagnosis not present

## 2017-04-27 DIAGNOSIS — Z7951 Long term (current) use of inhaled steroids: Secondary | ICD-10-CM | POA: Diagnosis not present

## 2017-04-27 DIAGNOSIS — R402413 Glasgow coma scale score 13-15, at hospital admission: Secondary | ICD-10-CM | POA: Diagnosis present

## 2017-04-27 DIAGNOSIS — S82201A Unspecified fracture of shaft of right tibia, initial encounter for closed fracture: Secondary | ICD-10-CM | POA: Diagnosis present

## 2017-04-27 DIAGNOSIS — S82202A Unspecified fracture of shaft of left tibia, initial encounter for closed fracture: Secondary | ICD-10-CM | POA: Diagnosis present

## 2017-04-27 DIAGNOSIS — Z9981 Dependence on supplemental oxygen: Secondary | ICD-10-CM

## 2017-04-27 DIAGNOSIS — Z79899 Other long term (current) drug therapy: Secondary | ICD-10-CM

## 2017-04-27 LAB — CBC
HEMATOCRIT: 38.9 % (ref 35.0–47.0)
HEMOGLOBIN: 13.2 g/dL (ref 12.0–16.0)
MCH: 34.4 pg — ABNORMAL HIGH (ref 26.0–34.0)
MCHC: 33.9 g/dL (ref 32.0–36.0)
MCV: 101.6 fL — ABNORMAL HIGH (ref 80.0–100.0)
Platelets: 210 10*3/uL (ref 150–440)
RBC: 3.83 MIL/uL (ref 3.80–5.20)
RDW: 14.6 % — ABNORMAL HIGH (ref 11.5–14.5)
WBC: 8 10*3/uL (ref 3.6–11.0)

## 2017-04-27 LAB — URINALYSIS, COMPLETE (UACMP) WITH MICROSCOPIC
BILIRUBIN URINE: NEGATIVE
GLUCOSE, UA: NEGATIVE mg/dL
Hgb urine dipstick: NEGATIVE
Ketones, ur: 20 mg/dL — AB
LEUKOCYTES UA: NEGATIVE
NITRITE: NEGATIVE
PH: 7 (ref 5.0–8.0)
Protein, ur: 30 mg/dL — AB
SPECIFIC GRAVITY, URINE: 1.015 (ref 1.005–1.030)

## 2017-04-27 LAB — BASIC METABOLIC PANEL
ANION GAP: 6 (ref 5–15)
BUN: 11 mg/dL (ref 6–20)
CHLORIDE: 104 mmol/L (ref 101–111)
CO2: 29 mmol/L (ref 22–32)
Calcium: 8.6 mg/dL — ABNORMAL LOW (ref 8.9–10.3)
Creatinine, Ser: 0.63 mg/dL (ref 0.44–1.00)
GFR calc non Af Amer: >60 mL/min (ref >60)
Glucose, Bld: 107 mg/dL — ABNORMAL HIGH (ref 65–99)
POTASSIUM: 3.3 mmol/L — AB (ref 3.5–5.1)
SODIUM: 139 mmol/L (ref 135–145)

## 2017-04-27 LAB — PROTIME-INR
INR: 0.98
PROTHROMBIN TIME: 12.9 s (ref 11.4–15.2)

## 2017-04-27 MED ORDER — BUDESONIDE 0.25 MG/2ML IN SUSP: 0.2500 mg | Freq: Two times a day (BID) | RESPIRATORY_TRACT | Status: DC | PRN

## 2017-04-27 MED ORDER — ACETAMINOPHEN 650 MG RE SUPP: 650.0000 mg | Freq: Four times a day (QID) | RECTAL | Status: DC | PRN

## 2017-04-27 MED ORDER — ACETAMINOPHEN 325 MG PO TABS
650.0000 mg | ORAL_TABLET | Freq: Four times a day (QID) | ORAL | Status: DC | PRN
  Administered 2017-04-29: 650 mg via ORAL
  Filled 2017-04-27: qty 2

## 2017-04-27 MED ORDER — DOCUSATE SODIUM 100 MG PO CAPS
100.0000 mg | ORAL_CAPSULE | Freq: Two times a day (BID) | ORAL | Status: DC
  Administered 2017-04-28 – 2017-04-30 (×5): 100 mg via ORAL
  Filled 2017-04-27 (×5): qty 1

## 2017-04-27 MED ORDER — POTASSIUM CHLORIDE CRYS ER 20 MEQ PO TBCR
20.0000 meq | EXTENDED_RELEASE_TABLET | Freq: Every day | ORAL | Status: DC
  Administered 2017-04-28 – 2017-04-30 (×3): 20 meq via ORAL
  Filled 2017-04-27 (×3): qty 1

## 2017-04-27 MED ORDER — ENOXAPARIN SODIUM 40 MG/0.4ML ~~LOC~~ SOLN
40.0000 mg | SUBCUTANEOUS | Status: DC
  Administered 2017-04-27 – 2017-04-29 (×3): 40 mg via SUBCUTANEOUS
  Filled 2017-04-27 (×3): qty 0.4

## 2017-04-27 MED ORDER — ALBUTEROL SULFATE (2.5 MG/3ML) 0.083% IN NEBU: INHALATION_SOLUTION | Freq: Four times a day (QID) | RESPIRATORY_TRACT | Status: DC | PRN

## 2017-04-27 MED ORDER — ONDANSETRON HCL 4 MG/2ML IJ SOLN: 4.0000 mg | Freq: Four times a day (QID) | INTRAMUSCULAR | Status: DC | PRN

## 2017-04-27 MED ORDER — HYDROCORTISONE ACETATE 25 MG RE SUPP
25.0000 mg | Freq: Two times a day (BID) | RECTAL | Status: DC
  Administered 2017-04-29: 25 mg via RECTAL
  Filled 2017-04-27 (×7): qty 1

## 2017-04-27 MED ORDER — FUROSEMIDE 20 MG PO TABS
20.0000 mg | ORAL_TABLET | Freq: Every day | ORAL | Status: DC
  Administered 2017-04-28 – 2017-04-30 (×3): 20 mg via ORAL
  Filled 2017-04-27 (×3): qty 1

## 2017-04-27 MED ORDER — LEVOTHYROXINE SODIUM 100 MCG PO TABS
100.0000 ug | ORAL_TABLET | Freq: Every day | ORAL | Status: DC
  Administered 2017-04-29 – 2017-04-30 (×2): 100 ug via ORAL
  Filled 2017-04-27 (×2): qty 1

## 2017-04-27 MED ORDER — MORPHINE SULFATE (PF) 2 MG/ML IV SOLN: 2.0000 mg | INTRAVENOUS | Status: DC | PRN

## 2017-04-27 MED ORDER — HYDROCODONE-ACETAMINOPHEN 5-325 MG PO TABS: 1.0000 | ORAL_TABLET | ORAL | Status: DC | PRN

## 2017-04-27 MED ORDER — ONDANSETRON HCL 4 MG PO TABS: 4.0000 mg | ORAL_TABLET | Freq: Four times a day (QID) | ORAL | Status: DC | PRN

## 2017-04-27 MED ORDER — VITAMIN D (ERGOCALCIFEROL) 1.25 MG (50000 UNIT) PO CAPS
50000.0000 [IU] | ORAL_CAPSULE | ORAL | Status: DC
  Filled 2017-04-27: qty 1

## 2017-04-27 MED ORDER — POTASSIUM CHLORIDE CRYS ER 20 MEQ PO TBCR
40.0000 meq | EXTENDED_RELEASE_TABLET | Freq: Once | ORAL | Status: AC
  Administered 2017-04-27: 40 meq via ORAL
  Filled 2017-04-27: qty 2

## 2017-04-27 MED ORDER — PRAVASTATIN SODIUM 20 MG PO TABS
40.0000 mg | ORAL_TABLET | Freq: Every day | ORAL | Status: DC
  Administered 2017-04-28 – 2017-04-30 (×3): 40 mg via ORAL
  Filled 2017-04-27 (×3): qty 2

## 2017-04-27 MED ORDER — ASPIRIN EC 81 MG PO TBEC
81.0000 mg | DELAYED_RELEASE_TABLET | Freq: Every day | ORAL | Status: DC
  Administered 2017-04-28 – 2017-04-30 (×3): 81 mg via ORAL
  Filled 2017-04-27 (×3): qty 1

## 2017-04-27 MED ORDER — MEMANTINE HCL 5 MG PO TABS
10.0000 mg | ORAL_TABLET | Freq: Two times a day (BID) | ORAL | Status: DC
  Administered 2017-04-27 – 2017-04-30 (×6): 10 mg via ORAL
  Filled 2017-04-27 (×6): qty 2

## 2017-04-27 MED ORDER — POLYETHYLENE GLYCOL 3350 17 G PO PACK: 17.0000 g | PACK | Freq: Every day | ORAL | Status: DC | PRN

## 2017-04-27 NOTE — Care Management Obs Status (Signed)
MEDICARE OBSERVATION STATUS NOTIFICATION   Patient Details  [facility] MR[facility]: [id number] Date of Birth: Mar 19, 1955   Medicare Observation Status Notification Given:  Yes    Collie Siadngela Chantilly Linskey, RN 04/27/2017, 3:46 PM

## 2017-04-27 NOTE — ED Triage Notes (Signed)
Here for right knee pain.  Sister who is legal guardian is present.  Pt lowered self to floor per report in [employer] then c/o right knee pain.  EMS reports sister told them she will do this for attention.  No deformity, redness or swelling

## 2017-04-27 NOTE — ED Notes (Signed)
Pt placed on bedpan

## 2017-04-27 NOTE — Progress Notes (Signed)
Pt arrived to room 144. Skin assessment completed with Reeves DamJadie, RN. Skin tear to left knee and covered with bandage. Sister at bedside and states she is legal guardian. Pt alert but unable to answer questions.

## 2017-04-27 NOTE — Care Management (Addendum)
RNCM consult discussed with ED provider regarding concern for patient returning home with fracture. Patient's sister [name] is a hemodialysis patient however [name] is also patient's guardian because patient is mentally handicapped.  Patient is supposed to use a walker however she was not. Now patient has a non-operable fracture and will be non-weight bearing and there's concern patient will not be able to mentally understand this restriction. CSW consult placed as patient has Medicaid and may need placement and PT under Medicare. I have also reached out to [facility] Elvera Bicker for assistance with resources for [name] as she is a hemodialysis patient- which is a long-shot.  Dr. Roland Rack is suggesting patient be brought in to hospital for therapy and pain management. PT/OT/SLP would be helpful based on patient's communication needs. RNCM met with [name] patient's sister. [name] has dialysis tomorrow. Patient appears to have foot drop on her left foot (right leg is injured).  Patient may need wheelchair if she cannot maintain weight bearing status.

## 2017-04-27 NOTE — ED Provider Notes (Signed)
[facility] Emergency Department Provider Note ____________________________________________  Time seen: 1355  I have reviewed the triage vital signs and the nursing notes.  HISTORY  Chief Complaint  Knee Pain  HPI Lori Petersen [facility] is a 62 y.o. female presents to the ED via EMS, from a local [employer].  She is accompanied by her older sister, who is her legal guardian.  According to the sister the patient was seen by her PCP at [facility] earlier this morning.  She apparently sustained a mechanical fall after her visit and was treated for a minor abrasion to her left knee.  She denied any further injury or pain. From [facility] clinic they were transported to the local [employer].  The sister alleges that the patient did a "swan dive" onto the floor at [facility]. When asked to clarify, this is what she describes a "fake fall" as her sister throws herself on the floor. The patient presents with right knee pain localized to the lateral joint.   Past Medical History:  Diagnosis Date  . Arthritis    lower spine  . Asthma   . Diastolic congestive heart failure (HCC)    "only has problems with bad colds"  . Down's syndrome   . Full dentures    does not wear  . GERD (gastroesophageal reflux disease)   . Hypothyroidism   . Onychomycosis     Patient Active Problem List   Diagnosis Date Noted  . Problems with swallowing and mastication   . Blood in stool   . Bradycardia 05/14/2015  . 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 Onychomycosis     Past Surgical History:  Procedure Laterality Date  . BREAST BIOPSY Left 04/05/2016   neg. cylinder clip  . BREAST BIOPSY Left 12/06/2016   ribbon clip. path pending  . COLONOSCOPY WITH PROPOFOL N/A 04/24/2016   Procedure: COLONOSCOPY WITH PROPOFOL;  Surgeon: Midge Minium, MD;  Location: [facility];  Service: Endoscopy;  Laterality: N/A;  Leave patient at 10:25 due to transportation  . ESOPHAGOGASTRODUODENOSCOPY (EGD)  WITH PROPOFOL N/A 04/24/2016   Procedure: ESOPHAGOGASTRODUODENOSCOPY (EGD) WITH PROPOFOL;  Surgeon: Midge Minium, MD;  Location: [facility];  Service: Endoscopy;  Laterality: N/A;  . HERNIA REPAIR      Prior to Admission medications   Medication Sig Start Date End Date Taking? Authorizing Provider  albuterol (ACCUNEB) 0.63 MG/3ML nebulizer solution Take 1 ampule by nebulization every 6 (six) hours as needed for wheezing.    [provider]  aspirin 81 MG tablet Take 81 mg by mouth daily.    [provider]  budesonide (PULMICORT) 0.25 MG/2ML nebulizer solution Take 0.25 mg by nebulization 2 (two) times daily as needed.    [provider]  donepezil (ARICEPT) 10 MG tablet Take by mouth. 05/07/15 05/06/16  [provider]  Fluticasone-Salmeterol (ADVAIR DISKUS) 250-50 MCG/DOSE AEPB Inhale 1 puff into the lungs every 12 (twelve) hours.    [provider]  FUROSEMIDE PO Take by mouth. TAKE ONE TABLET TWICE WEEKLY    [provider]  hydrocortisone (ANUSOL-HC) 25 MG suppository Place 1 suppository (25 mg total) rectally 2 (two) times daily. 04/27/16   Midge Minium, MD  latanoprost (XALATAN) 0.005 % ophthalmic solution  05/07/13   [provider]  levothyroxine (SYNTHROID, LEVOTHROID) 112 MCG tablet Take 112 mcg by mouth daily.    [provider]  OXYGEN Inhale into the lungs as needed.    [provider]  polyethylene glycol (  GOLYTELY) 236 g solution Drink one 8 oz glass every 20 mins until stools are clear 03/29/16   Midge Minium, MD  potassium chloride SA (K-DUR,KLOR-CON) 20 MEQ tablet  04/30/13   [provider]  pravastatin (PRAVACHOL) 40 MG tablet Take 40 mg by mouth daily.    [provider]  VITAMIN D, CHOLECALCIFEROL, PO Take by mouth. TAKE ONE CAPSULE ONCE WEEKLY    [provider]    Allergies Patient has no known allergies.  History reviewed. No pertinent family  history.  Social History Social History   Tobacco Use  . Smoking status: Never Smoker  . Smokeless tobacco: Never Used  Substance Use Topics  . Alcohol use: No  . Drug use: Not on file    Review of Systems  Constitutional: Negative for fever. Eyes: Negative for visual changes. ENT: Negative for sore throat. Cardiovascular: Negative for chest pain. Respiratory: Negative for shortness of breath. Gastrointestinal: Negative for abdominal pain, vomiting and diarrhea. Musculoskeletal: Negative for back pain. Right knee pain Skin: Negative for rash. Neurological: Negative for headaches, focal weakness or numbness. ____________________________________________  PHYSICAL EXAM:  VITAL SIGNS: ED Triage Vitals  Enc Vitals Group     BP 04/27/17 1334 111/69     Pulse Rate 04/27/17 1334 61     Resp 04/27/17 1334 20     Temp 04/27/17 1334 98.1 F (36.7 C)     Temp Source 04/27/17 1334 Oral     SpO2 04/27/17 1334 97 %     Weight 04/27/17 1335 218 lb (98.9 kg)     Height --      Head Circumference --      Peak Flow --      Pain Score --      Pain Loc --      Pain Edu? --      Excl. in GC? --     Constitutional: Alert and oriented. Well appearing and in no distress. Head: Normocephalic and atraumatic. Eyes: Conjunctivae are normal. PERRL. Normal extraocular movements Neck: Supple. No thyromegaly. Normal ROM.  Cardiovascular: Normal rate, regular rhythm. Normal distal pulses. Respiratory: Normal respiratory effort. No wheezes/rales/rhonchi. Gastrointestinal: Soft and nontender. No distention. Musculoskeletal: Knee without obvious deformity, dislocation, or effusion.  No overlying abrasions, ecchymosis, or erythema is appreciated.  Patient with normal flexion and extension range of the right knee joint.  She localizes pain to the lateral aspect of the joint line.  No popliteal space fullness is noted.  No calf or Achilles tenderness is appreciated.  Ankle exam is benign.  Nontender  with normal range of motion in all extremities.  Neurologic: Normal gross sensation. No gross focal neurologic deficits are appreciated. Skin:  Skin is warm, dry and intact. No rash noted. ____________________________________________   RADIOLOGY  Right Knee IMPRESSION: Comminuted fracture proximal tibia extending from the midline at the knee joint to a level at the medial proximal tibial diaphysis. A fracture fragment also extends inferiorly in the midline to the proximal diaphyseal level. No other fractures are evident. No dislocation or appreciable joint effusion. No appreciable arthropathy. ____________________________________________  PROCEDURES Knee immobilizer ____________________________________________  INITIAL IMPRESSION / ASSESSMENT AND PLAN / ED COURSE  ----------------------------------------- 3:08 PM on 04/27/2017 -----------------------------------------  S/w Dr. Joice Lofts: He recommends admit to hospitalist for pain control, PT/OT, and social work consultations. He will follow the patient.  ----------------------------------------- 3:45 PM on 04/27/2017 -----------------------------------------  S/W Dr. Nemiah Commander, he will admit to service.  ____________________________________________  FINAL CLINICAL IMPRESSION(S) / ED DIAGNOSES  Final  diagnoses:  Tibial plateau fracture, right, closed, initial encounter      Lissa HoardMenshew, Haydon Dorris V Bacon, PA-C 04/27/17 1547    Sharyn CreamerQuale, Mark, MD 04/27/17 [phone]

## 2017-04-27 NOTE — H&P (Signed)
[facility] [place] [facility]   PATIENT NAME: Lori Petersen    MR#:  [id number]  DATE OF BIRTH:  06/11/1955  DATE OF ADMISSION:  04/27/2017  PRIMARY CARE PHYSICIAN: [facility]   REQUESTING/REFERRING PHYSICIAN: Dr. Sharyn Creamer  CHIEF COMPLAINT:   Chief Complaint  Patient presents with  . Knee Pain    HISTORY OF PRESENT ILLNESS:  Lori Petersen  is a 62 y.o. female with a known history of Down syndrome, GERD, asthma, diastolic CHF and arthritis presents to hospital secondary to a fall and right leg pain. Patient unable to give history due to her Down syndrome.  History of the history is obtained from her sister at bedside.  Sister is a caregiver.  According to sister patient ambulates without any support at home.  She once in a while uses the sister's cane or walker.  She went to see her PCP today at [facility] clinic for urinary incontinence complaints.  Patient hasn't been sick recently.  No fevers or chills, no nausea vomiting or diarrhea.  She was unsteady and had a mechanical fall and as soon as she tried to get up, complaining of significant right knee pain.  X-rays in the emergency room showing comminuted proximal tibial fracture.  She is being admitted for pain control and physical therapy  PAST MEDICAL HISTORY:   Past Medical History:  Diagnosis Date  . Arthritis    lower spine  . Asthma   . Diastolic congestive heart failure (HCC)    "only has problems with bad colds"  . Down's syndrome   . Full dentures    does not wear  . GERD (gastroesophageal reflux disease)   . Hypothyroidism   . Onychomycosis     PAST SURGICAL HISTORY:   Past Surgical History:  Procedure Laterality Date  . BREAST BIOPSY Left 04/05/2016   neg. cylinder clip  . BREAST BIOPSY Left 12/06/2016   ribbon clip. path pending  . COLONOSCOPY WITH PROPOFOL N/A 04/24/2016   Procedure: COLONOSCOPY WITH PROPOFOL;  Surgeon: Midge Minium, MD;  Location: [facility];  Service: Endoscopy;  Laterality: N/A;  Leave patient at 10:25 due to transportation  . ESOPHAGOGASTRODUODENOSCOPY (EGD) WITH PROPOFOL N/A 04/24/2016   Procedure: ESOPHAGOGASTRODUODENOSCOPY (EGD) WITH PROPOFOL;  Surgeon: Midge Minium, MD;  Location: [facility];  Service: Endoscopy;  Laterality: N/A;  . HERNIA REPAIR      SOCIAL HISTORY:   Social History   Tobacco Use  . Smoking status: Never Smoker  . Smokeless tobacco: Never Used  Substance Use Topics  . Alcohol use: No    FAMILY HISTORY:  History reviewed. No pertinent family history.  DRUG ALLERGIES:  No Known Allergies  REVIEW OF SYSTEMS:   Review of Systems  Unable to perform ROS: Psychiatric disorder    MEDICATIONS AT HOME:   Prior to Admission medications   Medication Sig Start Date End Date Taking? Authorizing Provider  aspirin 81 MG tablet Take 81 mg by mouth daily.   Yes [provider]  furosemide (LASIX) 20 MG tablet Take 1 tablet by mouth daily.    Yes [provider]  levothyroxine (SYNTHROID, LEVOTHROID) 100 MCG tablet Take 100 mcg by mouth daily.    Yes [provider]  memantine (NAMENDA) 10 MG tablet Take 1 tablet by mouth 2 (two) times daily. 02/26/17  Yes [provider]  OXYGEN Inhale into the lungs as needed.   Yes [provider]  potassium chloride  SA (K-DUR,KLOR-CON) 20 MEQ tablet Take 20 mEq by mouth daily.  04/30/13  Yes [provider]  pravastatin (PRAVACHOL) 40 MG tablet Take 40 mg by mouth daily.   Yes [provider]  Vitamin D, Ergocalciferol, (DRISDOL) 50000 units CAPS capsule Take 50,000 Units by mouth every 7 (seven) days.   Yes [provider]  albuterol (ACCUNEB) 0.63 MG/3ML nebulizer solution Take 1 ampule by nebulization every 6 (six) hours as needed for wheezing.    [provider]  budesonide (PULMICORT) 0.25 MG/2ML nebulizer solution Take 0.25 mg by nebulization 2 (two) times daily as  needed.     [provider]  hydrocortisone (ANUSOL-HC) 25 MG suppository Place 1 suppository (25 mg total) rectally 2 (two) times daily. 04/27/16   Midge Minium, MD  polyethylene glycol (GOLYTELY) 236 g solution Drink one 8 oz glass every 20 mins until stools are clear Patient not taking: Reported on 04/27/2017 03/29/16   Midge Minium, MD      VITAL SIGNS:  Blood pressure 133/88, pulse 63, temperature 98.1 F (36.7 C), temperature source Oral, resp. rate 18, weight 98.9 kg (218 lb), SpO2 100 %.  PHYSICAL EXAMINATION:   Physical Exam  GENERAL:  62 y.o.-year-old patient lying in the bed with no acute distress.  EYES: Pupils equal, round, reactive to light and accommodation. No scleral icterus. Extraocular muscles intact.  HEENT: Head atraumatic, normocephalic. Oropharynx and nasopharynx clear.  NECK:  Supple, no jugular venous distention. No thyroid enlargement, no tenderness.  LUNGS: Normal breath sounds bilaterally, no wheezing, rales,rhonchi or crepitation. No use of accessory muscles of respiration. Decreased bibasilar breath sounds CARDIOVASCULAR: S1, S2 normal. No  rubs, or gallops. 2/6 systolic murmur present ABDOMEN: Soft, obese,nontender, nondistended. Bowel sounds present. No organomegaly or mass.  EXTREMITIES: No pedal edema, cyanosis, or clubbing.  NEUROLOGIC: Cranial nerves II through XII are intact. Muscle strength 5/5 in all extremities except right leg in brace. Sensation intact. Gait not checked.  PSYCHIATRIC: The patient is alert.  SKIN: No obvious rash, lesion, or ulcer.   LABORATORY PANEL:   CBC Recent Labs  Lab 04/27/17 1607  WBC 8.0  HGB 13.2  HCT 38.9  PLT 210   ------------------------------------------------------------------------------------------------------------------  Chemistries  No results for input(s): NA, K, CL, CO2, GLUCOSE, BUN, CREATININE, CALCIUM, MG, AST, ALT, ALKPHOS, BILITOT in the last 168 hours.  Invalid input(s):  GFRCGP ------------------------------------------------------------------------------------------------------------------  Cardiac Enzymes No results for input(s): TROPONINI in the last 168 hours. ------------------------------------------------------------------------------------------------------------------  RADIOLOGY:  Dg Knee Complete 4 Views Right  Result Date: 04/27/2017 CLINICAL DATA:  Pain following fall EXAM: RIGHT KNEE - COMPLETE 4+ VIEW COMPARISON:  None. FINDINGS: Frontal, lateral, and bilateral oblique views were obtained. There is a comminuted fracture of the proximal tibia extending from the midline at the level of the knee joint extending inferiorly and medially to the level of the proximal tibial diaphysis. Overall fracture fragments are in near anatomic alignment. No other fractures are evident. No dislocation. No appreciable knee joint effusion. No erosive changes. No appreciable joint space narrowing. IMPRESSION: Comminuted fracture proximal tibia extending from the midline at the knee joint to a level at the medial proximal tibial diaphysis. A fracture fragment also extends inferiorly in the midline to the proximal diaphyseal level. No other fractures are evident. No dislocation or appreciable joint effusion. No appreciable arthropathy. Electronically Signed   By: Bretta Bang III M.D.   On: 04/27/2017 14:16    EKG:   Orders placed or performed in visit on 08/22/06  .  EKG 12-Lead    IMPRESSION AND PLAN:   Rayburn GoMary Dauphin  is a 62 y.o. female with a known history of Down syndrome, GERD, asthma, diastolic CHF and arthritis presents to hospital secondary to a fall and right leg pain.  1. Right proximal tibial fracture-admit for pain control - from mechanical  fall- check UA -Orthopedics consult and physical therapy consult -Might need placement -Social worker consulted  2.  Down syndrome and dementia-makes her needs known. - continue namenda  3.   Hypothyroidism-continue Synthroid  4.  Diastolic CHF-well compensated.  Follow basic metabolic panel.  Continue Lasix and potassium supplements  5.  DVT prophylaxis-Lovenox.   All the records are reviewed and case discussed with ED provider. Management plans discussed with the patient, family and they are in agreement.  CODE STATUS: Full Code  TOTAL TIME TAKING CARE OF THIS PATIENT: 50 minutes.    Enid BaasKALISETTI,Ajanay Farve M.D on 04/27/2017 at 4:32 PM  Between 7am to 6pm - Pager - [phone]  After 6pm go to [email or web address] - Social research officer, governmentpassword [email or web address]  [facility]  Office  [phone]  CC: Primary care physician; [facility]

## 2017-04-28 LAB — BASIC METABOLIC PANEL
Anion gap: 8 (ref 5–15)
BUN: 12 mg/dL (ref 6–20)
CALCIUM: 8.8 mg/dL — AB (ref 8.9–10.3)
CO2: 27 mmol/L (ref 22–32)
CREATININE: 0.7 mg/dL (ref 0.44–1.00)
Chloride: 105 mmol/L (ref 101–111)
GFR calc non Af Amer: >60 mL/min (ref >60)
Glucose, Bld: 109 mg/dL — ABNORMAL HIGH (ref 65–99)
Potassium: 3.7 mmol/L (ref 3.5–5.1)
SODIUM: 140 mmol/L (ref 135–145)

## 2017-04-28 LAB — CBC
HCT: 41.1 % (ref 35.0–47.0)
Hemoglobin: 14.1 g/dL (ref 12.0–16.0)
MCH: 34.6 pg — AB (ref 26.0–34.0)
MCHC: 34.2 g/dL (ref 32.0–36.0)
MCV: 101.1 fL — ABNORMAL HIGH (ref 80.0–100.0)
PLATELETS: 187 10*3/uL (ref 150–440)
RBC: 4.07 MIL/uL (ref 3.80–5.20)
RDW: 14.4 % (ref 11.5–14.5)
WBC: 8.2 10*3/uL (ref 3.6–11.0)

## 2017-04-28 NOTE — Consult Note (Signed)
ORTHOPAEDIC CONSULTATION  REQUESTING PHYSICIAN: Alford HighlandWieting, Richard, MD  Chief Complaint:   Right knee injury  History of Present Illness: [facility] 62 y.[facility]. female with a history of Down syndrome, congestive heart failure, and gastroesophageal reflux who lives with her sister.  Apparently, she fell to the floor yesterday while [facility] her [facility], injuring her right knee.  She was brought to the emergency room where x-rays demonstrated a minimally displaced medial tibial plateau fracture.  The patient substernally was admitted for pain control and possible rehab placement.  The patient denies any associated injuries.  She did not strike her head or lose consciousness.  Past Medical History:  Diagnosis Date  . Arthritis    lower spine  . Asthma   . Diastolic congestive heart failure (HCC)    "only has problems with bad colds"  . Down's syndrome   . Full dentures    does not wear  . GERD (gastroesophageal reflux disease)   . Hypothyroidism   . Onychomycosis    Past Surgical History:  Procedure Laterality Date  . BREAST BIOPSY Left 04/05/2016   neg. cylinder clip  . BREAST BIOPSY Left 12/06/2016   ribbon clip. path pending  . COLONOSCOPY WITH PROPOFOL N/A 04/24/2016   Procedure: COLONOSCOPY WITH PROPOFOL;  Surgeon: Midge Miniumarren Wohl, MD;  [facility];  Service: Endoscopy;  Laterality: N/A;  Leave patient at 10:25 due to transportation  . ESOPHAGOGASTRODUODENOSCOPY (EGD) WITH PROPOFOL N/A 04/24/2016   Procedure: ESOPHAGOGASTRODUODENOSCOPY (EGD) WITH PROPOFOL;  Surgeon: Midge Miniumarren Wohl, MD;  [facility];  Service: Endoscopy;  Laterality: N/A;  . HERNIA REPAIR     Social History   Socioeconomic History  . Marital status: Single    Spouse name: Not on file  . Number of children: Not on file  . Years of education: Not on file  . Highest education level: Not on file   Occupational History  . Not on file  Social Needs  . Financial resource strain: Not on file  . Food insecurity:    Worry: Not on file    Inability: Not on file  . Transportation needs:    Medical: Not on file    Non-medical: Not on file  Tobacco Use  . Smoking status: Never Smoker  . Smokeless tobacco: Never Used  Substance and Sexual Activity  . Alcohol use: No  . Drug use: Not Currently  . Sexual activity: Not Currently  Lifestyle  . Physical activity:    Days per week: Not on file    Minutes per session: Not on file  . Stress: Not on file  Relationships  . Social connections:    Talks on phone: Not on file    Gets together: Not on file    Attends religious service: Not on file    Active member of club or organization: Not on file    Attends meetings of clubs or organizations: Not on file    Relationship status: Not on file  Other Topics Concern  . Not on file  Social History Narrative  . Not on file   History reviewed. No pertinent family history. No Known Allergies Prior to Admission medications   Medication Sig Start Date End Date Taking? Authorizing Provider  furosemide (LASIX) 20 MG tablet Take 1 tablet by mouth daily.    Yes [provider]  levothyroxine (SYNTHROID, LEVOTHROID) 100 MCG tablet Take 100 mcg by mouth daily.    Yes [provider]  memantine (NAMENDA) 10 MG tablet Take 1 tablet by mouth 2 (two) times daily. 02/26/17  Yes [provider]  OXYGEN Inhale into the lungs as needed.   Yes [provider]  pravastatin (PRAVACHOL) 40 MG tablet Take 40 mg by mouth daily.   Yes [provider]  Vitamin D, Ergocalciferol, (DRISDOL) 50000 units CAPS capsule Take 50,000 Units by mouth every 7 (seven) days.   Yes [provider]  albuterol (ACCUNEB) 0.63 MG/3ML nebulizer solution Take 1 ampule by nebulization every 6 (six) hours as needed for wheezing.    [provider]  aspirin 81 MG tablet Take 81  mg by mouth daily.    [provider]  budesonide (PULMICORT) 0.25 MG/2ML nebulizer solution Take 0.25 mg by nebulization 2 (two) times daily as needed.     [provider]  hydrocortisone (ANUSOL-HC) 25 MG suppository Place 1 suppository (25 mg total) rectally 2 (two) times daily. 04/27/16   Midge Minium, MD  polyethylene glycol (GOLYTELY) 236 g solution Drink one 8 oz glass every 20 mins until stools are clear Patient not taking: Reported on 04/27/2017 03/29/16   Midge Minium, MD  potassium chloride SA (K-DUR,KLOR-CON) 20 MEQ tablet Take 20 mEq by mouth daily.  04/30/13   [provider]   Dg Knee Complete 4 Views Right  Result Date: 04/27/2017 CLINICAL DATA:  Pain following fall EXAM: RIGHT KNEE - COMPLETE 4+ VIEW COMPARISON:  None. FINDINGS: Frontal, lateral, and bilateral oblique views were obtained. There is a comminuted fracture of the proximal tibia extending from the midline at the level of the knee joint extending inferiorly and medially to the level of the proximal tibial diaphysis. Overall fracture fragments are in near anatomic alignment. No other fractures are evident. No dislocation. No appreciable knee joint effusion. No erosive changes. No appreciable joint space narrowing. IMPRESSION: Comminuted fracture proximal tibia extending from the midline at the knee joint to a level at the medial proximal tibial diaphysis. A fracture fragment also extends inferiorly in the midline to the proximal diaphyseal level. No other fractures are evident. No dislocation or appreciable joint effusion. No appreciable arthropathy. Electronically Signed   By: Bretta Bang III M.D.   On: 04/27/2017 14:16    Positive ROS: All other systems have been reviewed and were otherwise negative with the exception of those mentioned in the HPI and as above.  Physical Exam: General:  Alert, no acute distress Psychiatric:  Patient is not competent for consent   Cardiovascular:  No pedal  edema Respiratory:  No wheezing, non-labored breathing GI:  Abdomen is soft and non-tender Skin:  No lesions in the area of chief complaint Neurologic:  Sensation intact distally Lymphatic:  No axillary or cervical lymphadenopathy  Orthopedic Exam:  Orthopedic examination is limited to the right lower extremity.  There is some swelling around the right knee as well as a 1-2+ effusion.  She has moderate tenderness to palpation over the medial aspect of the knee.  She has pain with any attempted active or passive motion of the knee.  She is able to dorsiflex and plantarflex her toes and ankle.  She sensations intact to light touch to all distributions of her right foot and lower leg.  She has good capillary refill to her right foot.  X-rays:  X-rays of the right knee are available for review.  These films demonstrate a minimally displaced medial tibial plateau fracture with the fracture extending into the tibial eminence region but not affecting the  weightbearing articular surface of the medial tibial plateau.  No significant degenerative changes are identified.  No lytic lesions are noted either.  Assessment: Minimally displaced right medial tibial plateau fracture.  Plan:  the treatment options are discussed with the patient.  However, the patient's sister, who apparently is her primary care provider, is not available to speak with at this time.  Given the patient's limited activity and dementia, as well as the minimal displacement of the fracture and the fact that it does not involve the weightbearing surface of the tibia, I am inclined to treat this injury nonsurgically.  She is to remain in her knee immobilizer and is to be nonweightbearing on the right lower extremity.  She may receive appropriate pain medication if indicated.  Thank you for asked me to participate in the care of this most unfortunate woman.  I will be happy to follow her with you.   Lori Amos, MD  Beeper #:  [phone]  04/28/2017 8:13 AM

## 2017-04-28 NOTE — Progress Notes (Signed)
Pt alert and able to answer some questions. No complaints of leg pain during the night. Pt incontinent of stool and urine during the night. Pt able to sleep in between care.

## 2017-04-28 NOTE — Clinical Social Work Note (Signed)
Clinical Social Work Assessment  Patient Details  Name: Lori Petersen MRN: [id number] Date of Birth: 1955/04/22  Date of referral:  04/28/17               Reason for consult:  Facility Placement                Permission sought to share information with:  Oceanographeracility Contact Representative Permission granted to share information::  Yes, Verbal Permission Granted  Name::        Agency::  All area SNFs in [place] except for [facility]  Relationship::     Contact Information:     Housing/Transportation Living arrangements for the past 2 months:  Single Family Home Source of Information:  Siblings, Medical Team, Guardian Patient Interpreter Needed:  None Criminal Activity/Legal Involvement Pertinent to Current Situation/Hospitalization:  No - Comment as needed Significant Relationships:  Merchandiser, retailCommunity Support, Church, Siblings Lives with:  Siblings Do you feel safe going back to the place where you live?  Yes Need for family participation in patient care:  Yes (Comment)(The patient has a legal guardian.)  Care giving concerns:  The patient may need SNF level of care due to cognitive limitations in the setting of a fracture.   Social Worker assessment / plan:  The CSW contacted the patient's sister/legal guardian Lori Petersen to discuss discharge planning. The CSW explained the referral process and the need for a PT evaluation for placement. The patient's sister verbalized understanding and gave permission to begin the referral process. The patient's sister has dialysis T,TH,S, and the concern is that the patient will need more care than her caregiver can provide while she is non-weightbearing on her right leg. PT evaluation is pending.  The CSW has begun both the referral process and the process to obtain a Level II PASRR due to the patient's IDD. The CSW will follow up with bed offers as available.   Employment status:  Retired Health and safety inspectornsurance information:  Armed forces operational officerMedicare, Medicaid [place] PT Recommendations:  Not assessed at this time Information / Referral to community resources:  Skilled Nursing Facility  Patient/Family's Response to care:  The family thanked the CSW for update and assistance.  Patient/Family's Understanding of and Emotional Response to Diagnosis, Current Treatment, and Prognosis:  The patient has limited understanding of the discharge plan. The patient's family understands the plan and are in agreement.  Emotional Assessment Appearance:  Appears stated age Attitude/Demeanor/Rapport:  Apprehensive, Guarded Affect (typically observed):  Anxious Orientation:  Oriented to Self, Oriented to Place Alcohol / Substance use:  Never Used Psych involvement (Current and /or in the community):  No (Comment)  Discharge Needs  Concerns to be addressed:  Care Coordination, Discharge Planning Concerns Readmission within the last 30 days:  No Current discharge risk:  Cognitively Impaired, Other(Caregiver not able to care for her current level of needs) Barriers to Discharge:  Continued Medical Work up, Designer, jewelleryAwaiting State Approval ([place])   Judi CongKaren M Jourdan Maldonado, LCSW 04/28/2017, 4:36 PM

## 2017-04-28 NOTE — NC FL2 (Signed)
[place] MEDICAID FL2 LEVEL OF CARE SCREENING TOOL     IDENTIFICATION  Patient Name: Lori Petersen [facility] Birthdate: 1955/02/19 Sex: female Admission Date (Current Location): 04/27/2017  [place] and [place] Number:  [address] [id number] [facility] Facility and Address:  [facility], [address] [place], [place] [address]      Provider Number: [id number]  Attending Physician Name and Address:  Alford Highland, MD  Relative Name and Phone Number:  Margurite Auerbach [facility] Guardian) [phone]    Current Level of Care: Hospital Recommended Level of Care: Skilled Nursing Facility Prior Approval Number:    Date Approved/Denied:   PASRR Number: Pending  Discharge Plan: SNF    Current Diagnoses: Patient Active Problem List   Diagnosis Date Noted  . Left tibial fracture 04/27/2017  . Right tibial fracture 04/27/2017  . Problems with swallowing and mastication   . Blood in stool   . Bradycardia 05/14/2015  . 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 Onychomycosis     Orientation RESPIRATION BLADDER Height & Weight     Self, Situation  Normal Continent Weight: 218 lb (98.9 kg) Height:  5' (152.4 cm)  BEHAVIORAL SYMPTOMS/MOOD NEUROLOGICAL BOWEL NUTRITION STATUS      Continent Diet(2 gram sodium, thin liquids)  AMBULATORY STATUS COMMUNICATION OF NEEDS Skin   Extensive Assist Verbally Bruising                       Personal Care Assistance Level of Assistance  Bathing, Feeding, Dressing Bathing Assistance: Limited assistance Feeding assistance: Independent Dressing Assistance: Limited assistance     Functional Limitations Info             SPECIAL CARE FACTORS FREQUENCY  PT (By licensed PT)     PT Frequency: Up to 5X per week              Contractures Contractures Info: Not present    Additional Factors Info  Code Status, Allergies, Psychotropic Code Status Info: Full Allergies Info: No known  allergies Psychotropic Info: Namenda         Current Medications (04/28/2017):  This is the current hospital active medication list Current Facility-Administered Medications  Medication Dose Route Frequency Provider Last Rate Last Dose  . acetaminophen (TYLENOL) tablet 650 mg  650 mg Oral Q6H PRN Enid Baas, MD       Or  . acetaminophen (TYLENOL) suppository 650 mg  650 mg Rectal Q6H PRN Enid Baas, MD      . albuterol (PROVENTIL) (2.5 MG/3ML) 0.083% nebulizer solution   Nebulization Q6H PRN Enid Baas, MD      . aspirin EC tablet 81 mg  81 mg Oral Daily Enid Baas, MD   81 mg at 04/28/17 0939  . budesonide (PULMICORT) nebulizer solution 0.25 mg  0.25 mg Nebulization BID PRN Enid Baas, MD      . docusate sodium (COLACE) capsule 100 mg  100 mg Oral BID Enid Baas, MD   100 mg at 04/28/17 0939  . enoxaparin (LOVENOX) injection 40 mg  40 mg Subcutaneous Q24H Enid Baas, MD   40 mg at 04/27/17 2108  . furosemide (LASIX) tablet 20 mg  20 mg Oral Daily Enid Baas, MD   20 mg at 04/28/17 0939  . HYDROcodone-acetaminophen (NORCO/VICODIN) 5-325 MG per tablet 1-2 tablet  1-2 tablet Oral Q4H PRN Enid Baas, MD      . hydrocortisone (ANUSOL-HC) suppository 25 mg  25 mg Rectal BID Enid Baas, MD      .  levothyroxine (SYNTHROID, LEVOTHROID) tablet 100 mcg  100 mcg Oral Daily Enid BaasKalisetti, Radhika, MD      . memantine [facility](NAMENDA) tablet 10 mg  10 mg Oral BID Enid BaasKalisetti, Radhika, MD   10 mg at 04/28/17 0939  . morphine 2 MG/ML injection 2 mg  2 mg Intravenous Q4H PRN Enid BaasKalisetti, Radhika, MD      . ondansetron (ZOFRAN) tablet 4 mg  4 mg Oral Q6H PRN Enid BaasKalisetti, Radhika, MD       Or  . ondansetron (ZOFRAN) injection 4 mg  4 mg Intravenous Q6H PRN Enid BaasKalisetti, Radhika, MD      . polyethylene glycol (MIRALAX / GLYCOLAX) packet 17 g  17 g Oral Daily PRN Enid BaasKalisetti, Radhika, MD      . potassium chloride SA (K-DUR,KLOR-CON) CR tablet 20 mEq  20  mEq Oral Daily Enid BaasKalisetti, Radhika, MD   20 mEq at 04/28/17 0939  . pravastatin (PRAVACHOL) tablet 40 mg  40 mg Oral Daily Enid BaasKalisetti, Radhika, MD   40 mg at 04/28/17 0940  . Vitamin D (Ergocalciferol) (DRISDOL) capsule 50,000 Units  50,000 Units Oral Q7 days Enid BaasKalisetti, Radhika, MD         Discharge Medications: Please see discharge summary for a list of discharge medications.  Relevant Imaging Results:  Relevant Lab Results:   Additional Information SS#[id number]  Judi CongKaren M Ovie Cornelio, LCSW

## 2017-04-28 NOTE — Progress Notes (Signed)
Patient ID: Lori Petersen, female   DOB: 18-Apr-1955, 62 y.o.   MRN: [id number]  [employer] PROGRESS NOTE  Lori Petersen FAO:[id number] DOB: 18-Apr-1955 DOA: 04/27/2017 [facility]  HPI/Subjective: Patient feels okay.  Does not complain of pain.  Her right leg is in an immobilizer.  Objective: Vitals:   04/27/17 2159 04/28/17 0746  BP: (!) 156/80 139/83  Pulse: 66 (!) 59  Resp: 18 18  Temp: 98.7 F (37.1 C) 97.8 F (36.6 C)  SpO2: 96% 97%    Filed Weights   04/27/17 1335  Weight: 98.9 kg (218 lb)    ROS: Review of Systems  Unable to perform ROS: Dementia  Respiratory: Negative for shortness of breath.   Cardiovascular: Negative for chest pain.  Gastrointestinal: Negative for abdominal pain, nausea and vomiting.  Genitourinary: Negative for dysuria.  Musculoskeletal: Negative for joint pain.   Exam: Physical Exam  HENT:  Nose: No mucosal edema.  Mouth/Throat: No oropharyngeal exudate or posterior oropharyngeal edema.  Eyes: Pupils are equal, round, and reactive to light. Conjunctivae and lids are normal.  Neck: No JVD present. Carotid bruit is not present. No edema present. No thyroid mass and no thyromegaly present.  Cardiovascular: S1 normal and S2 normal. Exam reveals no gallop.  No murmur heard. Pulses:      Dorsalis pedis pulses are 2+ on the right side, and 2+ on the left side.  Respiratory: No respiratory distress. She has no wheezes. She has no rhonchi. She has no rales.  GI: Soft. Bowel sounds are normal. There is no tenderness.  Musculoskeletal:       Right ankle: She exhibits no swelling.       Left ankle: She exhibits no swelling.  Right leg in immobilizer  Lymphadenopathy:    She has no cervical adenopathy.  Neurological: She is alert. No cranial nerve deficit.  Skin: Skin is warm. No rash noted. Nails show no clubbing.  Psychiatric: She has a normal mood and affect.      Data Reviewed: Basic Metabolic  Panel: Recent Labs  Lab 04/27/17 1607 04/28/17 0336  NA 139 140  K 3.3* 3.7  CL 104 105  CO2 29 27  GLUCOSE 107* 109*  BUN 11 12  CREATININE 0.63 0.70  CALCIUM 8.6* 8.8*   CBC: Recent Labs  Lab 04/27/17 1607 04/28/17 0336  WBC 8.0 8.2  HGB 13.2 14.1  HCT 38.9 41.1  MCV 101.6* 101.1*  PLT 210 187     Studies: Dg Knee Complete 4 Views Right  Result Date: 04/27/2017 CLINICAL DATA:  Pain following fall EXAM: RIGHT KNEE - COMPLETE 4+ VIEW COMPARISON:  None. FINDINGS: Frontal, lateral, and bilateral oblique views were obtained. There is a comminuted fracture of the proximal tibia extending from the midline at the level of the knee joint extending inferiorly and medially to the level of the proximal tibial diaphysis. Overall fracture fragments are in near anatomic alignment. No other fractures are evident. No dislocation. No appreciable knee joint effusion. No erosive changes. No appreciable joint space narrowing. IMPRESSION: Comminuted fracture proximal tibia extending from the midline at the knee joint to a level at the medial proximal tibial diaphysis. A fracture fragment also extends inferiorly in the midline to the proximal diaphyseal level. No other fractures are evident. No dislocation or appreciable joint effusion. No appreciable arthropathy. Electronically Signed   By: Bretta BangWilliam  Woodruff III M.D.   On: 04/27/2017 14:16    Scheduled Meds: . aspirin EC  81 mg Oral Daily  . docusate sodium  100 mg Oral BID  . enoxaparin (LOVENOX) injection  40 mg Subcutaneous Q24H  . furosemide  20 mg Oral Daily  . hydrocortisone  25 mg Rectal BID  . levothyroxine  100 mcg Oral Daily  . memantine  10 mg Oral BID  . potassium chloride SA  20 mEq Oral Daily  . pravastatin  40 mg Oral Daily  . Vitamin D (Ergocalciferol)  50,000 Units Oral Q7 days   Continuous Infusions:  Assessment/Plan:   1. Right proximal tibial fracture.  Seen by orthopedic surgery and recommended medical management  without weightbearing of the right leg.  Physical therapy consultation.  Pain control.  Spoke with patient's sister on the phone and she will have to go to rehab.  Case discussed with social worker to look into bed offers. 2. History of Down syndrome and dementia.  On Namenda 3. Hypothyroidism unspecified on levothyroxine 4. Hyperlipidemia unspecified on pravastatin 5. History of chronic diastolic congestive heart failure.  No signs currently on low-dose Lasix.  Code Status:     Code Status Orders  (From admission, onward)        Start     Ordered   04/27/17 1751  Full code  Continuous     04/27/17 1750    Code Status History    This patient has a current code status but no historical code status.    Advance Directive Documentation     Most Recent Value  Type of Advance Directive  Healthcare Power of Attorney  Pre-existing out of facility DNR order (yellow form or pink MOST form)  -  "MOST" Form in Place?  -     Family Communication: Spoke with sister on the phone Disposition Plan: Will need rehab and a 3 night stay.  Likely out to rehab on Monday  Consultants:  Orthopedic surgery  Time spent: 27 minutes  Yousaf Sainato [employer]

## 2017-04-28 NOTE — Clinical Social Work Note (Signed)
CSW received consult for SNF placement. CSW will follow pending PT recommendation.  Romeka Scifres Martha Kamaree Wheatley, MSW, LCSWA [phone] 

## 2017-04-28 NOTE — Progress Notes (Signed)
04/28/17 1900  PT Visit Information  Last PT Received On 04/29/17  Assistance Needed +2  History of Present Illness 62 yo female with fall that she cannot provide history for, resulting in a R tibial plateau fracture.  Pt is in an immobilizer, NWB and has limited tolerance for mobility.  Her caregivers are not there, but is usually at home with her sister who also is on HD.  PMHx:  Down's syndrome, CHF, dementia, hypothyroidism    Precautions  Precautions Fall  Restrictions  Weight Bearing Restrictions Yes  RLE Weight Bearing NWB  Other Position/Activity Restrictions must leave immobilizer on R knee at all times  Home Living  Family/patient expects to be discharged to: Skilled nursing facility  Living Arrangements Other relatives  Additional Comments lives with sister who is in HD  Prior Function  Level of Independence Needs assistance  Gait / Transfers Assistance Needed pt states she walks with no AD  ADL's / Homemaking Assistance Needed lives with sister who cares for her  Communication  Communication Expressive difficulties  Pain Assessment  Pain Assessment Faces  Faces Pain Scale 8  Pain Location R leg with any movement of leg in brace  Pain Descriptors / Indicators Sharp;Tender  Pain Intervention(s) Limited activity within patient's tolerance;Monitored during session;Premedicated before session;Repositioned  Cognition  Arousal/Alertness Awake/alert  Behavior During Therapy Impulsive;Anxious  Overall Cognitive Status History of cognitive impairments - at baseline  Upper Extremity Assessment  Upper Extremity Assessment Generalized weakness  Lower Extremity Assessment  Lower Extremity Assessment Generalized weakness  Cervical / Trunk Assessment  Cervical / Trunk Assessment Normal  Bed Mobility  Overal bed mobility Needs Assistance  Bed Mobility Supine to Sit;Sit to Supine  Supine to sit Max assist  Sit to supine Total assist  General bed mobility comments two person  assist for sitting up to support trunk and legs, then total assist to return to bed  Transfers  Overall transfer level Needs assistance  Equipment used 1 person hand held assist  Transfers Sit to/from Stand  Sit to Stand Total assist  General transfer comment attempted to stand and pt was unable to assist   Ambulation/Gait  General Gait Details unable to walk  Balance  Overall balance assessment Needs assistance  Sitting-balance support Feet supported;Bilateral upper extremity supported  Sitting balance-Leahy Scale Poor  Standing balance-Leahy Scale Zero  PT - End of Session  Activity Tolerance Patient limited by pain  Patient left in bed;with call bell/phone within reach;with bed alarm set  Nurse Communication Mobility status  PT Assessment  PT Recommendation/Assessment Patient needs continued PT services  PT Visit Diagnosis Muscle weakness (generalized) (M62.81);History of falling (Z91.81);Difficulty in walking, not elsewhere classified (R26.2);Pain  Pain - Right/Left Right  Pain - part of body Leg;Knee  PT Problem List Decreased strength;Decreased range of motion;Decreased activity tolerance;Decreased balance;Decreased mobility;Decreased coordination;Decreased cognition;Decreased knowledge of use of DME;Decreased safety awareness;Decreased knowledge of precautions;Obesity;Decreased skin integrity;Pain  Barriers to Discharge Decreased caregiver support;Inaccessible home environment  Barriers to Discharge Comments pt requires 2-3 person assist for NWB on RLE  PT Plan  PT Frequency (ACUTE ONLY) Min 2X/week  PT Treatment/Interventions (ACUTE ONLY) DME instruction;Gait training;Functional mobility training;Therapeutic activities;Therapeutic exercise;Neuromuscular re-education;Balance training;Patient/family education  AM-PAC PT "6 Clicks" Daily Activity Outcome Measure  Difficulty turning over in bed (including adjusting bedclothes, sheets and blankets)? 1  Difficulty moving from lying  on back to sitting on the side of the bed?  1  Difficulty sitting down on and standing up from a chair  with arms (e.g., wheelchair, bedside commode, etc,.)? 1  Help needed moving to and from a bed to chair (including a wheelchair)? 1  Help needed walking in hospital room? 1  Help needed climbing 3-5 steps with a railing?  1  6 Click Score 6  Mobility G Code  CN  PT Recommendation  Follow Up Recommendations SNF  PT equipment None recommended by PT  Individuals Consulted  Consulted and Agree with Results and Recommendations Patient unable/family or caregiver not available  Acute Rehab PT Goals  Patient Stated Goal none stated  PT Goal Formulation Patient unable to participate in goal setting  Time For Goal Achievement 05/12/17  Potential to Achieve Goals Fair  PT Time Calculation  PT Start Time (ACUTE ONLY) 0903  PT Stop Time (ACUTE ONLY) 0935  PT Time Calculation (min) (ACUTE ONLY) 32 min  PT G-Codes **NOT FOR INPATIENT CLASS**  Functional Assessment Tool Used AM-PAC 6 Clicks Basic Mobility  PT General Charges  $$ ACUTE PT VISIT 1 Visit  PT Evaluation  $PT Eval Moderate Complexity 1 Mod  PT Treatments  $Therapeutic Activity 8-22 mins  Written Expression  Dominant Hand Right    Lori Petersen, PT MS Acute Rehab Dept. Number: [facility] [phone] and [facility] [phone]

## 2017-04-29 NOTE — Progress Notes (Signed)
Patient ID: Lori Petersen [facility], female   DOB: 1955/11/28, 62 y.o.   MRN: [id number]  [employer] PROGRESS NOTE  Katheryn Culliton [facility] XLK:[id number] DOB: April 21, 1955 DOA: 04/27/2017 PCP: [facility]  HPI/Subjective: Patient feels okay.  Does not complain of pain.  Feels okay.  Answers no to all questions.  Objective: Vitals:   04/28/17 2312 04/29/17 0851  BP: 128/83 124/76  Pulse: 81 65  Resp: 18 18  Temp: 98.3 F (36.8 C) 97.7 F (36.5 C)  SpO2: 95% 95%    Filed Weights   04/27/17 1335  Weight: 98.9 kg (218 lb)    ROS: Review of Systems  Unable to perform ROS: Dementia  Respiratory: Negative for shortness of breath.   Cardiovascular: Negative for chest pain.  Gastrointestinal: Negative for abdominal pain, nausea and vomiting.  Genitourinary: Negative for dysuria.  Musculoskeletal: Negative for joint pain.   Exam: Physical Exam  HENT:  Nose: No mucosal edema.  Mouth/Throat: No oropharyngeal exudate or posterior oropharyngeal edema.  Eyes: Pupils are equal, round, and reactive to light. Conjunctivae and lids are normal.  Neck: No JVD present. Carotid bruit is not present. No edema present. No thyroid mass and no thyromegaly present.  Cardiovascular: S1 normal and S2 normal. Exam reveals no gallop.  No murmur heard. Pulses:      Dorsalis pedis pulses are 2+ on the right side, and 2+ on the left side.  Respiratory: No respiratory distress. She has no wheezes. She has no rhonchi. She has no rales.  GI: Soft. Bowel sounds are normal. There is no tenderness.  Musculoskeletal:       Right ankle: She exhibits no swelling.       Left ankle: She exhibits no swelling.  Right leg in immobilizer  Lymphadenopathy:    She has no cervical adenopathy.  Neurological: She is alert. No cranial nerve deficit.  Skin: Skin is warm. No rash noted. Nails show no clubbing.  Psychiatric: She has a normal mood and affect.      Data Reviewed: Basic Metabolic  Panel: Recent Labs  Lab 04/27/17 1607 04/28/17 0336  NA 139 140  K 3.3* 3.7  CL 104 105  CO2 29 27  GLUCOSE 107* 109*  BUN 11 12  CREATININE 0.63 0.70  CALCIUM 8.6* 8.8*   CBC: Recent Labs  Lab 04/27/17 1607 04/28/17 0336  WBC 8.0 8.2  HGB 13.2 14.1  HCT 38.9 41.1  MCV 101.6* 101.1*  PLT 210 187    Scheduled Meds: . aspirin EC  81 mg Oral Daily  . docusate sodium  100 mg Oral BID  . enoxaparin (LOVENOX) injection  40 mg Subcutaneous Q24H  . furosemide  20 mg Oral Daily  . hydrocortisone  25 mg Rectal BID  . levothyroxine  100 mcg Oral Daily  . memantine  10 mg Oral BID  . potassium chloride SA  20 mEq Oral Daily  . pravastatin  40 mg Oral Daily  . Vitamin D (Ergocalciferol)  50,000 Units Oral Q7 days    Assessment/Plan:   1. Right proximal tibial fracture.  Seen by orthopedic surgery and recommended medical management without weightbearing of the right leg.  Physical therapy consultation appreciated.  pain control.  Physical therapy recommended rehab. Spoke with sister yesterday and she also stated that she will not be able to take care of the patient. 2. History of Down syndrome and dementia.  On Namenda 3. Hypothyroidism unspecified on levothyroxine 4. Hyperlipidemia unspecified on pravastatin 5. History  of chronic diastolic congestive heart failure.  No signs currently on low-dose Lasix.  Code Status:     Code Status Orders  (From admission, onward)        Start     Ordered   04/27/17 1751  Full code  Continuous     04/27/17 1750    Code Status History    This patient has a current code status but no historical code status.    Advance Directive Documentation     Most Recent Value  Type of Advance Directive  Healthcare Power of Attorney  Pre-existing out of facility DNR order (yellow form or pink MOST form)  -  "MOST" Form in Place?  -     Family Communication: Spoke with sister on the phone yesterday Disposition Plan: Will need rehab and a 3  night stay.  Likely out to rehab on Monday  Consultants:  Orthopedic surgery  Time spent: 24 minutes  Karolyne Timmons [employer]  [employer]

## 2017-04-29 NOTE — Progress Notes (Signed)
Physical Therapy Treatment Patient Details Name: Lori Petersen [facility] MRN: [id number] DOB: Jun 22, 1955 Today's Date: 04/29/2017    History of Present Illness 62 yo female with fall that she cannot provide history for, resulting in a R tibial plateau fracture.  Pt is in an immobilizer, NWB and has limited tolerance for mobility.  Her caregivers are not there, but is usually at home with her sister who also is on HD.  PMHx:  Down's syndrome, CHF, dementia, hypothyroidism      PT Comments    Pt received semirecumbent in bed resting quietly watching [employer] Pt is interactive agreeable to participate, denies pain, but does grimace and vocalize pain with some Rt knee angle changes. KI noted to be donned almost completely below knee joint, hence removed to donn correctly, also noted to be on upside down, and insufficiently tight to provide any immobilization of the joint, as the patient with in a flexed knee posture upon arrival. Pt following verbal cues for exercise ~25% of cues or less, noted weakness on BLE. When patient's [facility] is lowered to allow for better ROM in hip HEP, pt becomes more lethargic and less participatory, eventually asleep by end of session. Will continue to follow acutely.    Follow Up Recommendations  SNF     Equipment Recommendations  None recommended by PT    Recommendations for Other Services       Precautions / Restrictions Precautions Precautions: Fall Restrictions RLE Weight Bearing: Non weight bearing Other Position/Activity Restrictions: must leave immobilizer on R knee at all times    Mobility  Bed Mobility Overal bed mobility: (not attempted this visit )                Transfers                    Ambulation/Gait                 Stairs             Wheelchair Mobility    Modified Rankin (Stroke Patients Only)       Balance                                            Cognition Arousal/Alertness:  Awake/alert;Lethargic Behavior During Therapy: Flat affect(drowsy ) Overall Cognitive Status: History of cognitive impairments - at baseline                                        Exercises General Exercises - Lower Extremity Ankle Circles/Pumps: AAROM;Both;15 reps;Supine Short Arc Quad: AAROM;Left;10 reps;Supine Heel Slides: PROM;Left;5 reps(unable to continue, patient is rigid, no tfollowing verbal cues somewhat confused) Hip ABduction/ADduction: Both;10 reps;Supine(minimal intermittent pain, otherwise tolerated well. ) Straight Leg Raises: 10 reps;Both;AROM;Supine(minimal intermittent pain, otherwise tolerated well. )    General Comments        Pertinent Vitals/Pain Faces Pain Scale: Hurts even more Pain Location: with RLE knee flexion angle changes  Pain Intervention(s): Limited activity within patient's tolerance;Monitored during session    Home Living                      Prior Function            PT Goals (current goals can  now be found in the care plan section) Progress towards PT goals: PT to reassess next treatment    Frequency    Min 2X/week      PT Plan Current plan remains appropriate    Co-evaluation              AM-PAC PT "6 Clicks" Daily Activity  Outcome Measure  Difficulty turning over in bed (including adjusting bedclothes, sheets and blankets)?: Unable Difficulty moving from lying on back to sitting on the side of the bed? : Unable Difficulty sitting down on and standing up from a chair with arms (e.g., wheelchair, bedside commode, etc,.)?: Unable Help needed moving to and from a bed to chair (including a wheelchair)?: Total Help needed walking in hospital room?: Total Help needed climbing 3-5 steps with a railing? : Total 6 Click Score: 6    End of Session Equipment Utilized During Treatment: Right knee immobilizer Activity Tolerance: Patient limited by pain Patient left: in bed;with call bell/phone within  reach;with bed alarm set Nurse Communication: Mobility status PT Visit Diagnosis: Muscle weakness (generalized) (M62.81);History of falling (Z91.81);Difficulty in walking, not elsewhere classified (R26.2);Pain Pain - Right/Left: Right Pain - part of body: Knee     Time: 1140-1159 PT Time Calculation (min) (ACUTE ONLY): 19 min  Charges:  $Therapeutic Activity: 8-22 mins                    G Codes:      12:22 PM, 04/29/17 Rosamaria LintsAllan C Buccola, PT, DPT Physical Therapist - [facility]  [phone] (ASCOM)     Buccola,Allan C 04/29/2017, 12:18 PM

## 2017-04-30 ENCOUNTER — Ambulatory Visit: Payer: Medicare Other | Admitting: Podiatry

## 2017-04-30 LAB — HIV ANTIBODY (ROUTINE TESTING W REFLEX): HIV SCREEN 4TH GENERATION: NONREACTIVE

## 2017-04-30 MED ORDER — HYDROCODONE-ACETAMINOPHEN 5-325 MG PO TABS: 1.0000 | ORAL_TABLET | Freq: Four times a day (QID) | ORAL | 0 refills | Status: DC | PRN

## 2017-04-30 MED ORDER — ASPIRIN EC 325 MG PO TBEC: 325.0000 mg | DELAYED_RELEASE_TABLET | Freq: Every day | ORAL | 0 refills | Status: AC

## 2017-04-30 MED ORDER — ACETAMINOPHEN 325 MG PO TABS: 650.0000 mg | ORAL_TABLET | Freq: Four times a day (QID) | ORAL | 0 refills | Status: DC | PRN

## 2017-04-30 MED ORDER — DOCUSATE SODIUM 100 MG PO CAPS: 100.0000 mg | ORAL_CAPSULE | Freq: Two times a day (BID) | ORAL | 0 refills | Status: DC

## 2017-04-30 NOTE — Discharge Summary (Signed)
[employer] - [place] [facility]   PATIENT NAME: Lori Petersen    MR#:  [id number]  DATE OF BIRTH:  [birth year]  DATE OF ADMISSION:  04/27/2017 ADMITTING PHYSICIAN: Enid Baas, MD  DATE OF DISCHARGE: 04/30/2017  PRIMARY CARE PHYSICIAN: [facility]    ADMISSION DIAGNOSIS:  Tibial plateau fracture, right, closed, initial encounter [S82.141A]  DISCHARGE DIAGNOSIS:  Right tibia fracture  SECONDARY DIAGNOSIS:   Past Medical History:  Diagnosis Date  . Arthritis    lower spine  . Asthma   . Diastolic congestive heart failure (HCC)    "only has problems with bad colds"  . Down's syndrome   . Full dentures    does not wear  . GERD (gastroesophageal reflux disease)   . Hypothyroidism   . Onychomycosis     HOSPITAL COURSE:   1.  Proximal right tibia fracture.  Seen by orthopedic surgery and they recommended medical management without weightbearing on the right leg.  Patient is in a leg immobilizer.  Dr. Joice Lofts orthopedic surgery wants to follow-up in 1 week for repeat x-rays in his office.  Physical therapy recommended rehab.  Spoke with sister on the phone and she is unable to take care of her if she is not able to walk.  Patient not really complaining of pain.  Try Tylenol before any pain medications.    As per orthopedic surgery, aspirin 325 mg daily for DVT prophylaxis  2.  History of Down syndrome and dementia on Namenda 3.  Hypothyroidism unspecified on levothyroxine 4.  Hyperlipidemia unspecified on pravastatin 5.  History of chronic diastolic congestive heart failure.  No signs currently.  On low-dose Lasix.  DISCHARGE CONDITIONS:   Satisfactory  CONSULTS OBTAINED:  Treatment Team:  Christena Flake, MD  DRUG ALLERGIES:  No Known Allergies  DISCHARGE MEDICATIONS:   Allergies as of 04/30/2017   No Known Allergies     Medication List    STOP taking these medications   aspirin 81 MG tablet Replaced by:  aspirin EC  325 MG tablet   OXYGEN   polyethylene glycol 236 g solution Commonly known as:  GOLYTELY     TAKE these medications   acetaminophen 325 MG tablet Commonly known as:  TYLENOL Take 2 tablets (650 mg total) by mouth every 6 (six) hours as needed for mild pain or moderate pain (or Fever >/= 101).   albuterol 0.63 MG/3ML nebulizer solution Commonly known as:  ACCUNEB Take 1 ampule by nebulization every 6 (six) hours as needed for wheezing.   aspirin EC 325 MG tablet Take 1 tablet (325 mg total) by mouth daily. Replaces:  aspirin 81 MG tablet   budesonide 0.25 MG/2ML nebulizer solution Commonly known as:  PULMICORT Take 0.25 mg by nebulization 2 (two) times daily as needed.   docusate sodium 100 MG capsule Commonly known as:  COLACE Take 1 capsule (100 mg total) by mouth 2 (two) times daily.   furosemide 20 MG tablet Commonly known as:  LASIX Take 1 tablet by mouth daily.   HYDROcodone-acetaminophen 5-325 MG tablet Commonly known as:  NORCO/VICODIN Take 1 tablet by mouth every 6 (six) hours as needed for severe pain.   hydrocortisone 25 MG suppository Commonly known as:  ANUSOL-HC Place 1 suppository (25 mg total) rectally 2 (two) times daily.   levothyroxine 100 MCG tablet Commonly known as:  SYNTHROID, LEVOTHROID Take 100 mcg by mouth daily.   memantine 10 MG tablet Commonly known as:  NAMENDA  Take 1 tablet by mouth 2 (two) times daily.   potassium chloride SA 20 MEQ tablet Commonly known as:  K-DUR,KLOR-CON Take 20 mEq by mouth daily.   pravastatin 40 MG tablet Commonly known as:  PRAVACHOL Take 40 mg by mouth daily.   Vitamin D (Ergocalciferol) 50000 units Caps capsule Commonly known as:  DRISDOL Take 50,000 Units by mouth every 7 (seven) days.        DISCHARGE INSTRUCTIONS:   Follow-up with team at rehab 1 day Follow-up with orthopedic surgery 1 week  If you experience worsening of your admission symptoms, develop shortness of breath, life  threatening emergency, suicidal or homicidal thoughts you must seek medical attention immediately by calling 911 or calling your MD immediately  if symptoms less severe.  You Must read complete instructions/literature along with all the possible adverse reactions/side effects for all the Medicines you take and that have been prescribed to you. Take any new Medicines after you have completely understood and accept all the possible adverse reactions/side effects.   Please note  You were cared for by a hospitalist during your hospital stay. If you have any questions about your discharge medications or the care you received while you were in the hospital after you are discharged, you can call the unit and asked to speak with the hospitalist on call if the hospitalist that took care of you is not available. Once you are discharged, your primary care physician will handle any further medical issues. Please note that NO REFILLS for any discharge medications will be authorized once you are discharged, as it is imperative that you return to your primary care physician (or establish a relationship with a primary care physician if you do not have one) for your aftercare needs so that they can reassess your need for medications and monitor your lab values.    Today   CHIEF COMPLAINT:   Chief Complaint  Patient presents with  . Knee Pain    HISTORY OF PRESENT ILLNESS:  Lori Petersen  is a 62 y.o. female came in with knee pain   VITAL SIGNS:  Blood pressure 117/82, pulse 72, temperature 98.6 F (37 C), temperature source Oral, resp. rate 16, height 5' (1.524 m), weight 98.9 kg (218 lb), SpO2 99 %.   PHYSICAL EXAMINATION:  GENERAL:  62 y.o.-year-old patient lying in the bed with no acute distress.  EYES: Pupils equal, round, reactive to light and accommodation. No scleral icterus. Extraocular muscles intact.  HEENT: Head atraumatic, normocephalic. Oropharynx and nasopharynx clear.  NECK:  Supple, no  jugular venous distention. No thyroid enlargement, no tenderness.  LUNGS: Normal breath sounds bilaterally, no wheezing, rales,rhonchi or crepitation. No use of accessory muscles of respiration.  CARDIOVASCULAR: S1, S2 normal. No murmurs, rubs, or gallops.  ABDOMEN: Soft, non-tender, non-distended. Bowel sounds present. No organomegaly or mass.  EXTREMITIES: Trace edema, no cyanosis, or clubbing.  NEUROLOGIC: Cranial nerves II through XII are intact. Sensation intact. Gait not checked.  PSYCHIATRIC: The patient is alert and answers some yes or no questions.  SKIN: No obvious rash, lesion, or ulcer.   DATA REVIEW:   CBC Recent Labs  Lab 04/28/17 0336  WBC 8.2  HGB 14.1  HCT 41.1  PLT 187    Chemistries  Recent Labs  Lab 04/28/17 0336  NA 140  K 3.7  CL 105  CO2 27  GLUCOSE 109*  BUN 12  CREATININE 0.70  CALCIUM 8.8*     Management plans discussed with the patient,  family and they are in agreement.  CODE STATUS:     Code Status Orders  (From admission, onward)        Start     Ordered   04/27/17 1751  Full code  Continuous     04/27/17 1750    Code Status History    This patient has a current code status but no historical code status.    Advance Directive Documentation     Most Recent Value  Type of Advance Directive  Healthcare Power of Attorney  Pre-existing out of facility DNR order (yellow form or pink MOST form)  -  "MOST" Form in Place?  -      TOTAL TIME TAKING CARE OF THIS PATIENT: 32 minutes.    Alford Highland M.D on 04/30/2017 at 1:53 PM  Between 7am to 6pm - Pager - [phone]  After 6pm go to [email or web address] - Social research officer, government  [employer] Office  [phone]  CC: Primary care physician; [facility]

## 2017-04-30 NOTE — Progress Notes (Signed)
Subjective: No new complaints.  Patient appears comfortable.   Objective: Vital signs in last 24 hours: Temp:  [97.7 F (36.5 C)-98.7 F (37.1 C)] 98.7 F (37.1 C) (04/15 0005) Pulse Rate:  [65-79] 79 (04/15 0005) Resp:  [18] 18 (04/15 0005) BP: (124-130)/(69-79) 130/69 (04/15 0005) SpO2:  [92 %-95 %] 94 % (04/15 0005)  Intake/Output from previous day: 04/14 0701 - 04/15 0700 In: 480 [P.O.:480] Out: 1000 [Urine:1000] Intake/Output this shift: No intake/output data recorded.  Recent Labs    04/27/17 1607 04/28/17 0336  HGB 13.2 14.1   Recent Labs    04/27/17 1607 04/28/17 0336  WBC 8.0 8.2  RBC 3.83 4.07  HCT 38.9 41.1  PLT 210 187   Recent Labs    04/27/17 1607 04/28/17 0336  NA 139 140  K 3.3* 3.7  CL 104 105  CO2 29 27  BUN 11 12  CREATININE 0.63 0.70  GLUCOSE 107* 109*  CALCIUM 8.6* 8.8*   Recent Labs    04/27/17 1607  INR 0.98    Physical Exam: Physical examination of right lower extremity unchanged as compared to 2 days ago.  She remains neurovascularly intact to her right lower extremity and foot.  X-rays: No new x-rays.  Assessment: Minimally displaced right medial tibial plateau fracture.  Plan: We will continue to treat her nonsurgically at this time.  I would like to see her back in the office in 7-10 days, at which time we will obtain repeat x-rays to be sure that there has been no displacement of the fracture.  Thank you for asking me to participate in the care of this most unfortunate woman.   Lori SeltzerJohn J Shailey Petersen 04/30/2017, 7:35 AM

## 2017-04-30 NOTE — Clinical Social Work Placement (Signed)
   CLINICAL SOCIAL WORK PLACEMENT  NOTE  Date:  04/30/2017  Patient Details  Name: [facility] MRN: [id number] Date of Birth: 21-May-1955  Clinical Social Work is seeking post-discharge placement for this patient at the Skilled  Nursing Facility level of care (*CSW will initial, date and re-position this form in  chart as items are completed):  Yes   Patient/family provided with [place] Clinical Social Work Department's list of facilities offering this level of care within the geographic area requested by the patient (or if unable, by the patient's family).  Yes   Patient/family informed of their freedom to choose among providers that offer the needed level of care, that participate in Medicare, Medicaid or managed care program needed by the patient, have an available bed and are willing to accept the patient.  Yes   Patient/family informed of [facility]'s ownership interest in [facility] and [facility], as well as of the fact that they are under no obligation to receive care at these facilities.  PASRR submitted to EDS on 04/28/17     PASRR number received on       Existing PASRR number confirmed on       FL2 transmitted to all facilities in geographic area requested by pt/family on 04/28/17     FL2 transmitted to all facilities within larger geographic area on       Patient informed that his/her managed care company has contracts with or will negotiate with certain facilities, including the following:        Yes   Patient/family informed of bed offers received.  [facility] [place]     Physician recommends and patient chooses bed at      Patient to be transferred to [facility] [place] on 04/30/17.  Patient to be transferred to facility by [facility]     Patient family notified on 04/30/17 of transfer.  Name of family member notified:  Patient's sister Nicole CellaDorothy     PHYSICIAN Please sign FL2     Additional Comment:      _______________________________________________ Darleene CleaverAnterhaus, Garret Teale R, LCSWA 04/30/2017, 2:15 PM

## 2017-04-30 NOTE — Clinical Social Work Note (Signed)
Passar number has been received, it is [id number], expiration 05-30-17.  Lori KnackEric R. Aliani Caccavale, MSW, Theresia MajorsLCSWA [phone]  04/30/2017 10:32 AM

## 2017-04-30 NOTE — Progress Notes (Signed)
Report called and given to [place] [facility]. EMS called for transport. IV removed. Will assist pt with getting dressed and await transport.

## 2017-04-30 NOTE — Clinical Social Work Note (Addendum)
CSW contacted patient's sister Nicole CellaDorothy, and presented bed offers.  Patient's sister chose [facility] [address].  [facility] contacted [facility] and [facility] can [facility] patient today, CSW updated physician.  Patient to be d/c'ed today to [facility] [place] room 603.  Patient and family agreeable to plans will transport via ems RN to call report 600 hall nurse.  Patient's sister Nicole CellaDorothy is aware that patient is ready for discharge today and will be going to [facility].   Ervin KnackEric R. Polette Nofsinger, MSW, Theresia MajorsLCSWA [phone]  04/30/2017 1:40 PM

## 2017-04-30 NOTE — Care Management Important Message (Signed)
Important Message  Patient Details  [facility] MRN: [id number] Date of Birth: Jan 08, 1956   Medicare Important Message Given:  Yes    [name] 04/30/2017, 11:44 AM

## 2017-04-30 NOTE — Progress Notes (Signed)
Physical Therapy Treatment Patient Details [facility]: [facility] MRN: [id number] DOB: 04/24/1955 Today's Date: 04/30/2017    History of Present Illness 62 yo female with fall that she cannot provide history for, resulting in a R tibial plateau fracture.  Pt is in an immobilizer, NWB and has limited tolerance for mobility.  Her caregivers are not there, but is usually at home with her sister who also is on HD.  PMHx:  Down's syndrome, CHF, dementia, hypothyroidism      PT Comments    Pt presents with deficits in strength, transfers, mobility, gait, balance, and activity tolerance.  Pt required extensive physical assistance and verbal/tactile cues during bed mobility training and with her therapeutic exercises.  Pt was unable to clear the EOB during transfer attempts from an elevated EOB with her RLE held out anteriorly to ensure to WB through the RLE.  Overall pt remains very limited functionally and will benefit from PT services in a SNF setting upon discharge to safely address above deficits for decreased caregiver assistance and eventual return to PLOF.     Follow Up Recommendations  SNF;Supervision for mobility/OOB     Equipment Recommendations  None recommended by PT    Recommendations for Other Services       Precautions / Restrictions Precautions Precautions: Fall Required Braces or Orthoses: Knee Immobilizer - Right Knee Immobilizer - Right: On at all times Restrictions Weight Bearing Restrictions: Yes RLE Weight Bearing: Non weight bearing Other Position/Activity Restrictions: Must leave immobilizer on R knee at all times    Mobility  Bed Mobility Overal bed mobility: Needs Assistance Bed Mobility: Supine to Sit;Sit to Supine     Supine to sit: Max assist Sit to supine: Max assist   General bed mobility comments: Max verbal and tactile cues for sequencing  Transfers                 General transfer comment: Attempted to stand from EOB with RLE extended out  to ensure WB status compliance with pt unable to clear EOB  Ambulation/Gait             General Gait Details: Unable   Stairs             Wheelchair Mobility    Modified Rankin (Stroke Patients Only)       Balance Overall balance assessment: Needs assistance Sitting-balance support: Bilateral upper extremity supported;Feet unsupported Sitting balance-Leahy Scale: Fair         Standing balance comment: Unable to stand                            Cognition Arousal/Alertness: Awake/alert Behavior During Therapy: Flat affect Overall Cognitive Status: History of cognitive impairments - at baseline                                        Exercises Total Joint Exercises Ankle Circles/Pumps: AAROM;AROM(Extensive verbal and tactile cues for therex technique and participation throughout session) Short Arc Quad: AROM;AAROM;Left;10 reps;15 reps Heel Slides: AROM;AAROM;Left;10 reps;15 reps Hip ABduction/ADduction: AAROM;Both;10 reps;15 reps Straight Leg Raises: AAROM;Both;10 reps;15 reps Long Arc Quad: AROM;AAROM;Left;10 reps Knee Flexion: AROM;AAROM;Left;10 reps Other Exercises Other Exercises: L ankle eversion PROM    General Comments        Pertinent Vitals/Pain Pain Assessment: No/denies pain    Home Living  Prior Function            PT Goals (current goals can now be found in the care plan section) Progress towards PT goals: Progressing toward goals    Frequency    7X/week      PT Plan Frequency needs to be updated    Co-evaluation              AM-PAC PT "6 Clicks" Daily Activity  Outcome Measure                   End of Session Equipment Utilized During Treatment: Right knee immobilizer;Gait belt Activity Tolerance: Patient tolerated treatment well Patient left: in bed;with call bell/phone within reach;with bed alarm set Nurse Communication: Mobility status PT  Visit Diagnosis: Muscle weakness (generalized) (M62.81);History of falling (Z91.81);Difficulty in walking, not elsewhere classified (R26.2);Pain Pain - Right/Left: Right Pain - part of body: Knee     Time: 1017-1040 PT Time Calculation (min) (ACUTE ONLY): 23 min  Charges:  $Therapeutic Exercise: 8-22 mins $Therapeutic Activity: 8-22 mins                    G Codes:       DElly Modena PT, DPT 04/30/17, 11:57 AM

## 2017-08-13 ENCOUNTER — Ambulatory Visit (INDEPENDENT_AMBULATORY_CARE_PROVIDER_SITE_OTHER): Payer: Medicare HMO | Admitting: Podiatry

## 2017-08-13 ENCOUNTER — Encounter: Payer: Self-pay | Admitting: Podiatry

## 2017-08-13 DIAGNOSIS — M79609 Pain in unspecified limb: Secondary | ICD-10-CM | POA: Diagnosis not present

## 2017-08-13 DIAGNOSIS — B351 Tinea unguium: Secondary | ICD-10-CM

## 2017-08-13 NOTE — Progress Notes (Signed)
Complaint:  Visit Type: Patient returns to my office for continued preventative foot care services. Complaint: Patient states" my nails have grown long and thick and become painful to walk and wear shoes" . The patient presents for preventative foot care services. No changes to ROS  Podiatric Exam: Vascular: dorsalis pedis and posterior tibial pulses are palpable bilateral. Capillary return is immediate. Temperature gradient is WNL. Skin turgor WNL  Sensorium: Normal Semmes Weinstein monofilament test. Normal tactile sensation bilaterally. Nail Exam: Pt has thick disfigured discolored nails with subungual debris noted bilateral entire nail hallux through fifth toenails Ulcer Exam: There is no evidence of ulcer or pre-ulcerative changes or infection. Orthopedic Exam: Muscle tone and strength are WNL. No limitations in general ROM. No crepitus or effusions noted. Foot type and digits show no abnormalities. Bony prominences are unremarkable. Skin: No Porokeratosis. No infection or ulcers.  Dry scaly dorsum feet  B/L.  Diagnosis:  Onychomycosis, , Pain in right toe, pain in left toes  Treatment & Plan Procedures and Treatment: Consent by patient was obtained for treatment procedures. The patient understood the discussion of treatment and procedures well. All questions were answered thoroughly reviewed. Debridement of mycotic and hypertrophic toenails, 1 through 5 bilateral and clearing of subungual debris. No ulceration, no infection noted. ABN signed for 2019. Recommend vaseline usage. Return Visit-Office Procedure: Patient instructed to return to the office for a follow up visit 3 months for continued evaluation and treatment.    Helane GuntherGregory Geneive Sandstrom DPM

## 2017-11-16 ENCOUNTER — Other Ambulatory Visit: Payer: Self-pay | Admitting: Family Medicine

## 2017-11-16 DIAGNOSIS — Z1231 Encounter for screening mammogram for malignant neoplasm of breast: Secondary | ICD-10-CM

## 2017-12-10 ENCOUNTER — Ambulatory Visit: Payer: Medicare HMO | Admitting: Podiatry

## 2017-12-24 ENCOUNTER — Ambulatory Visit: Payer: Medicare HMO | Admitting: Podiatry

## 2018-01-04 ENCOUNTER — Other Ambulatory Visit: Payer: Self-pay

## 2018-01-04 ENCOUNTER — Emergency Department: Payer: Medicare HMO

## 2018-01-04 ENCOUNTER — Emergency Department
Admission: EM | Admit: 2018-01-04 | Discharge: 2018-01-04 | Disposition: A | Discharge status: Home / Self Care | Payer: Medicare HMO | Attending: Emergency Medicine | Admitting: Emergency Medicine

## 2018-01-04 ENCOUNTER — Encounter: Payer: Self-pay | Admitting: Emergency Medicine

## 2018-01-04 DIAGNOSIS — R079 Chest pain, unspecified: Secondary | ICD-10-CM | POA: Diagnosis not present

## 2018-01-04 DIAGNOSIS — R55 Syncope and collapse: Secondary | ICD-10-CM

## 2018-01-04 DIAGNOSIS — Z79899 Other long term (current) drug therapy: Secondary | ICD-10-CM | POA: Diagnosis not present

## 2018-01-04 DIAGNOSIS — F0280 Dementia in other diseases classified elsewhere without behavioral disturbance: Secondary | ICD-10-CM | POA: Diagnosis not present

## 2018-01-04 DIAGNOSIS — E039 Hypothyroidism, unspecified: Secondary | ICD-10-CM | POA: Diagnosis not present

## 2018-01-04 DIAGNOSIS — Z7982 Long term (current) use of aspirin: Secondary | ICD-10-CM | POA: Insufficient documentation

## 2018-01-04 DIAGNOSIS — G308 Other Alzheimer's disease: Secondary | ICD-10-CM | POA: Insufficient documentation

## 2018-01-04 DIAGNOSIS — J45909 Unspecified asthma, uncomplicated: Secondary | ICD-10-CM | POA: Insufficient documentation

## 2018-01-04 LAB — HEPATIC FUNCTION PANEL
ALBUMIN: 3.8 g/dL (ref 3.5–5.0)
ALT: 13 U/L (ref 0–44)
AST: 22 U/L (ref 15–41)
Alkaline Phosphatase: 47 U/L (ref 38–126)
BILIRUBIN INDIRECT: 0.5 mg/dL (ref 0.3–0.9)
Bilirubin, Direct: 0.2 mg/dL (ref 0.0–0.2)
TOTAL PROTEIN: 7.2 g/dL (ref 6.5–8.1)
Total Bilirubin: 0.7 mg/dL (ref 0.3–1.2)

## 2018-01-04 LAB — CBC
HEMATOCRIT: 42.5 % (ref 36.0–46.0)
Hemoglobin: 13.8 g/dL (ref 12.0–15.0)
MCH: 32.4 pg (ref 26.0–34.0)
MCHC: 32.5 g/dL (ref 30.0–36.0)
MCV: 99.8 fL (ref 80.0–100.0)
NRBC: 0 % (ref 0.0–0.2)
PLATELETS: 221 10*3/uL (ref 150–400)
RBC: 4.26 MIL/uL (ref 3.87–5.11)
RDW: 15.8 % — AB (ref 11.5–15.5)
WBC: 5.7 10*3/uL (ref 4.0–10.5)

## 2018-01-04 LAB — BASIC METABOLIC PANEL
ANION GAP: 9 (ref 5–15)
BUN: 9 mg/dL (ref 8–23)
CALCIUM: 9 mg/dL (ref 8.9–10.3)
CHLORIDE: 104 mmol/L (ref 98–111)
CO2: 28 mmol/L (ref 22–32)
Creatinine, Ser: 0.74 mg/dL (ref 0.44–1.00)
GFR calc non Af Amer: >60 mL/min (ref >60)
Glucose, Bld: 118 mg/dL — ABNORMAL HIGH (ref 70–99)
Potassium: 3.4 mmol/L — ABNORMAL LOW (ref 3.5–5.1)
SODIUM: 141 mmol/L (ref 135–145)

## 2018-01-04 LAB — TROPONIN I: Troponin I: <0.03 ng/mL (ref <0.03)

## 2018-01-04 LAB — LIPASE, BLOOD: LIPASE: 21 U/L (ref 11–51)

## 2018-01-04 MED ORDER — IOPAMIDOL (ISOVUE-370) INJECTION 76%
75.0000 mL | Freq: Once | INTRAVENOUS | Status: AC | PRN
  Administered 2018-01-04: 75 mL via INTRAVENOUS

## 2018-01-04 NOTE — ED Notes (Signed)
Pt up to toilet 

## 2018-01-04 NOTE — ED Notes (Signed)
Sister, who is her legal guardian, is [facility].

## 2018-01-04 NOTE — ED Provider Notes (Signed)
[facility] Emergency Department Provider Note  ____________________________________________   I have reviewed the triage vital signs and the nursing notes. Where available I have reviewed prior notes and, if possible and indicated, outside hospital notes.    HISTORY  Chief Complaint Near Syncope    HPI Lori Petersen [facility] is a 62 y.o. female  With a history of Down syndrome, very low ability to give me much history.  Has heart failure that reportedly accept "only with bad colds" according to family she was at her doctor for routine checkup and not feeling bad when she seemed sleepy.  She had not had anything to eat yet today, she did not actually pass out she did not fall she did not hit her head, there was concern about this however and they brought her in.  Patient has been coughing for the last week and the only complaint that she has is that it hurts when she coughs.  She has no chest pain otherwise no exertional symptoms.  No fever no chills no nausea no vomiting or shortness of breath, family states that she is in her baseline on all of the respects and they would like to go home as soon as possible.  Level 5 chart caveat; no further history available due to patient status.   Past Medical History:  Diagnosis Date  . Arthritis    lower spine  . Asthma   . Diastolic congestive heart failure (HCC)    "only has problems with bad colds"  . Down's syndrome   . Full dentures    does not wear  . GERD (gastroesophageal reflux disease)   . Hypothyroidism   . Onychomycosis     Patient Active Problem List   Diagnosis Date Noted  . Left tibial fracture 04/27/2017  . Right tibial fracture 04/27/2017  . Problems with swallowing and mastication   . Blood in stool   . Bradycardia 05/14/2015  . 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 Onychomycosis     Past Surgical History:  Procedure Laterality Date  . BREAST BIOPSY Left 04/05/2016   neg.  cylinder clip  . BREAST BIOPSY Left 12/06/2016   ribbon clip. path pending  . COLONOSCOPY WITH PROPOFOL N/A 04/24/2016   Procedure: COLONOSCOPY WITH PROPOFOL;  Surgeon: Midge Minium, MD;  [facility];  Service: Endoscopy;  Laterality: N/A;  Leave patient at 10:25 due to transportation  . ESOPHAGOGASTRODUODENOSCOPY (EGD) WITH PROPOFOL N/A 04/24/2016   Procedure: ESOPHAGOGASTRODUODENOSCOPY (EGD) WITH PROPOFOL;  Surgeon: Midge Minium, MD;  Location: [facility];  Service: Endoscopy;  Laterality: N/A;  . HERNIA REPAIR      Prior to Admission medications   Medication Sig Start Date End Date Taking? Authorizing Provider  acetaminophen (TYLENOL) 325 MG tablet Take 2 tablets (650 mg total) by mouth every 6 (six) hours as needed for mild pain or moderate pain (or Fever >/= 101). 04/30/17   Alford Highland, MD  albuterol (ACCUNEB) 0.63 MG/3ML nebulizer solution Take 1 ampule by nebulization every 6 (six) hours as needed for wheezing.    [provider]  aspirin EC 325 MG tablet Take 1 tablet (325 mg total) by mouth daily. 04/30/17 04/30/18  Alford Highland, MD  budesonide (PULMICORT) 0.25 MG/2ML nebulizer solution Take 0.25 mg by nebulization 2 (two) times daily as needed.     [provider]  docusate sodium (COLACE) 100 MG capsule Take 1 capsule (100 mg total) by mouth 2 (two) times daily. 04/30/17  Alford HighlandWieting, Richard, MD  furosemide (LASIX) 20 MG tablet Take 1 tablet by mouth daily.     [provider]  HYDROcodone-acetaminophen (NORCO/VICODIN) 5-325 MG tablet Take 1 tablet by mouth every 6 (six) hours as needed for severe pain. 04/30/17   Alford HighlandWieting, Richard, MD  hydrocortisone (ANUSOL-HC) 25 MG suppository Place 1 suppository (25 mg total) rectally 2 (two) times daily. 04/27/16   Midge MiniumWohl, Darren, MD  levothyroxine (SYNTHROID, LEVOTHROID) 100 MCG tablet Take 100 mcg by mouth daily.     [provider]  memantine (NAMENDA) 10 MG tablet Take 1 tablet by mouth  2 (two) times daily. 02/26/17   [provider]  potassium chloride SA (K-DUR,KLOR-CON) 20 MEQ tablet Take 20 mEq by mouth daily.  04/30/13   [provider]  pravastatin (PRAVACHOL) 40 MG tablet Take 40 mg by mouth daily.    [provider]  Vitamin D, Ergocalciferol, (DRISDOL) 50000 units CAPS capsule Take 50,000 Units by mouth every 7 (seven) days.    [provider]    Allergies Patient has no known allergies.  No family history on file.  Social History Social History   Tobacco Use  . Smoking status: Never Smoker  . Smokeless tobacco: Never Used  Substance Use Topics  . Alcohol use: No  . Drug use: Not Currently    Review of Systems history per patient to the extent that she can answer as well as family Constitutional: No fever/chills Eyes: No visual changes. ENT: No sore throat. No stiff neck no neck pain Cardiovascular: Denies chest pain. Respiratory: Denies shortness of breath. Gastrointestinal:   no vomiting.  No diarrhea.  No constipation. Genitourinary: Negative for dysuria. Musculoskeletal: Negative lower extremity swelling Skin: Negative for rash. Neurological: Negative for severe headaches, focal weakness or numbness.   ____________________________________________   PHYSICAL EXAM:  VITAL SIGNS: ED Triage Vitals  Enc Vitals Group     BP 01/04/18 1026 (!) 121/95     Pulse Rate 01/04/18 1026 65     Resp 01/04/18 1026 18     Temp 01/04/18 1026 97.6 F (36.4 C)     Temp src --      SpO2 01/04/18 1026 100 %     Weight 01/04/18 1019 192 lb 14.4 oz (87.5 kg)     Height 01/04/18 1019 5' (1.524 m)     Head Circumference --      [facility] Flow --      Pain Score 01/04/18 1117 0     Pain Loc --      Pain Edu? --      Excl. in GC? --     Constitutional: Alert and oriented baseline. Well appearing and in no acute distress. Eyes: Conjunctivae are normal Head: Atraumatic HEENT: No congestion/rhinnorhea. Mucous membranes are  moist.  Oropharynx non-erythematous Neck:   Nontender with no meningismus, no masses, no stridor Cardiovascular: Normal rate, regular rhythm. Grossly normal heart sounds.  Good peripheral circulation. S: Tender palpation of chest wall when I touch this area patient states "ouch that the pain right there" and pulls back no crepitus no flail chest no lesions noted Respiratory: Normal respiratory effort.  No retractions. Lungs CTAB. Abdominal: Soft and nontender. No distention. No guarding no rebound Back:  There is no focal tenderness or step off.  there is no midline tenderness there are no lesions noted. there is no CVA tenderness Musculoskeletal: No lower extremity tenderness, no upper extremity tenderness. No joint effusions, no DVT signs strong distal pulses no  edema Neurologic:  Normal speech and language. No gross focal neurologic deficits are appreciated.  Skin:  Skin is warm, dry and intact. No rash noted. Psychiatric: Mood and affect are normal. Speech and behavior are normal.  ____________________________________________   LABS (all labs ordered are listed, but only abnormal results are displayed)  Labs Reviewed  BASIC METABOLIC PANEL - Abnormal; Notable for the following components:      Result Value   Potassium 3.4 (*)    Glucose, Bld 118 (*)    All other components within normal limits  CBC - Abnormal; Notable for the following components:   RDW 15.8 (*)    All other components within normal limits  TROPONIN I  TROPONIN I  HEPATIC FUNCTION PANEL  LIPASE, BLOOD    Pertinent labs  results that were available during my care of the patient were reviewed by me and considered in my medical decision making (see chart for details). ____________________________________________  EKG  I personally interpreted any EKGs ordered by me or triage Sinus rhythm rate 3 bpm no acute ST elevation or depression, normal axis unremarkable EKG  ____________________________________________  RADIOLOGY  Pertinent labs & imaging results that were available during my care of the patient were reviewed by me and considered in my medical decision making (see chart for details). If possible, patient and/or family made aware of any abnormal findings.  Dg Chest 2 View  Result Date: 01/04/2018 CLINICAL DATA:  62 year old female with syncope, chest pain EXAM: CHEST - 2 VIEW COMPARISON:  Prior chest x-ray 01/19/2012 FINDINGS: Stable enlargement of the cardiopericardial silhouette. Mediastinal contours remain within normal limits. Mild pulmonary vascular congestion without edema. The main and central pulmonary arteries are enlarged suggesting underlying pulmonary arterial hypertension. No pneumothorax or large pleural effusion. No acute osseous abnormality. IMPRESSION: 1. Stable cardiomegaly and pulmonary vascular congestion without overt edema. 2. Enlarged main and central pulmonary arteries as can be seen in the setting of pulmonary arterial hypertension. Electronically Signed   By: Malachy MoanHeath  McCullough M.D.   On: 01/04/2018 10:48   Ct Angio Chest Pe W And/or Wo Contrast  Result Date: 01/04/2018 CLINICAL DATA:  Near syncopal episode with decreased responsiveness today. Central chest pain. EXAM: CT ANGIOGRAPHY CHEST WITH CONTRAST TECHNIQUE: Multidetector CT imaging of the chest was performed using the standard protocol during bolus administration of intravenous contrast. Multiplanar CT image reconstructions and MIPs were obtained to evaluate the vascular anatomy. CONTRAST:  75mL ISOVUE-370 IOPAMIDOL (ISOVUE-370) INJECTION 76% COMPARISON:  Two-view chest x-ray 01/04/2018 FINDINGS: Cardiovascular: Heart is enlarged. Aorta and great vessel origins are unremarkable. Pulmonary artery opacification is excellent. No focal filling defects are present to suggest pulmonary embolus. Mediastinum/Nodes: No significant mediastinal, hilar, or axillary adenopathy is present.  Thoracic inlet is within normal limits. Lungs/Pleura: Patchy ground-glass attenuation likely represents atelectasis. Edema could have a similar appearance. Infection is not excluded. No significant airspace consolidation is present. There is no pneumothorax. No nodule, mass, or airspace disease is present. There is no significant pleural effusion. Upper Abdomen: There is fatty infiltration liver. No discrete lesions are present. Limited imaging of the upper abdomen is otherwise unremarkable. Musculoskeletal: Mild rightward curvature is present in the thoracic spine. Vertebral body heights alignment are maintained. Vacuum phenomenon are associated with degenerative disc disease. Sternum is intact. Ribs are unremarkable. Review of the MIP images confirms the above findings. IMPRESSION: 1. No pulmonary embolus. 2. Cardiomegaly. 3. Patchy ground-glass attenuation likely reflects atelectasis or edema. Infection is less likely. 4. Probable hepatic steatosis. Electronically Signed  By: Marin Roberts M.D.   On: 01/04/2018 13:42   ____________________________________________    PROCEDURES  Procedure(s) performed: None  Procedures  Critical Care performed: None  ____________________________________________   INITIAL IMPRESSION / ASSESSMENT AND PLAN / ED COURSE  Pertinent labs & imaging results that were available during my care of the patient were reviewed by me and considered in my medical decision making (see chart for details).  Patient here with a questionable near syncopal event and very reproducible chest wall pain, history is confounded by MR, I did do a CT scan as accommodation of chest pain, for history, near syncope is always concerning to me.  That was negative.  No evidence of flu, serial enzymes are negative and this is very reproducible pain with cough.  She has no dyspnea her vital signs are reassuring, family are eager to go home we will discharge him with return precautions and  follow-up given and understood.    ____________________________________________   FINAL CLINICAL IMPRESSION(S) / ED DIAGNOSES  Final diagnoses:  Near syncope      This chart was dictated using voice recognition software.  Despite best efforts to proofread,  errors can occur which can change meaning.      Jeanmarie Plant, MD 01/04/18 [phone]

## 2018-01-04 NOTE — ED Notes (Signed)
Report given to Rachel RN

## 2018-01-04 NOTE — ED Triage Notes (Signed)
[facility]. Reports she was there for routine visit this morning and just prior to discharge, patient had near syncopal episode with decreased responsiveness. Upon arrival, patient is alert at baseline but complaining of pain to center of chest, worse with cough and palpation.

## 2018-01-04 NOTE — ED Notes (Signed)
Pt back from CT

## 2018-01-04 NOTE — Discharge Instructions (Addendum)
Turn to the emergency room for any new or worsening symptoms, make sure you eat and drink at home, if you have increased pain in your chest shortness of breath, or any lightheadedness or you feel worse in any way return to the emergency department.

## 2018-01-04 NOTE — ED Notes (Signed)
Patient transported to CT 

## 2018-02-11 ENCOUNTER — Ambulatory Visit: Payer: Medicare HMO | Admitting: Podiatry

## 2018-11-02 ENCOUNTER — Inpatient Hospital Stay
Admission: EM | Admit: 2018-11-02 | Discharge: 2018-11-03 | DRG: 177 | Disposition: A | Discharge status: Other Acute Inpatient Hospital | Payer: Medicare (Managed Care) | Attending: Internal Medicine | Admitting: Internal Medicine

## 2018-11-02 ENCOUNTER — Emergency Department: Payer: Medicare (Managed Care)

## 2018-11-02 ENCOUNTER — Other Ambulatory Visit: Payer: Self-pay

## 2018-11-02 DIAGNOSIS — R0603 Acute respiratory distress: Secondary | ICD-10-CM

## 2018-11-02 DIAGNOSIS — I5032 Chronic diastolic (congestive) heart failure: Secondary | ICD-10-CM | POA: Diagnosis present

## 2018-11-02 DIAGNOSIS — E440 Moderate protein-calorie malnutrition: Secondary | ICD-10-CM | POA: Diagnosis present

## 2018-11-02 DIAGNOSIS — E860 Dehydration: Secondary | ICD-10-CM | POA: Diagnosis present

## 2018-11-02 DIAGNOSIS — J9601 Acute respiratory failure with hypoxia: Secondary | ICD-10-CM | POA: Diagnosis present

## 2018-11-02 DIAGNOSIS — G309 Alzheimer's disease, unspecified: Secondary | ICD-10-CM | POA: Diagnosis present

## 2018-11-02 DIAGNOSIS — E039 Hypothyroidism, unspecified: Secondary | ICD-10-CM | POA: Diagnosis present

## 2018-11-02 DIAGNOSIS — Z79890 Hormone replacement therapy (postmenopausal): Secondary | ICD-10-CM

## 2018-11-02 DIAGNOSIS — F0280 Dementia in other diseases classified elsewhere without behavioral disturbance: Secondary | ICD-10-CM | POA: Diagnosis present

## 2018-11-02 DIAGNOSIS — M1990 Unspecified osteoarthritis, unspecified site: Secondary | ICD-10-CM | POA: Diagnosis present

## 2018-11-02 DIAGNOSIS — U071 COVID-19: Principal | ICD-10-CM | POA: Diagnosis present

## 2018-11-02 DIAGNOSIS — J1289 Other viral pneumonia: Secondary | ICD-10-CM | POA: Diagnosis present

## 2018-11-02 DIAGNOSIS — Q909 Down syndrome, unspecified: Secondary | ICD-10-CM

## 2018-11-02 DIAGNOSIS — Z7951 Long term (current) use of inhaled steroids: Secondary | ICD-10-CM

## 2018-11-02 DIAGNOSIS — R579 Shock, unspecified: Secondary | ICD-10-CM | POA: Diagnosis not present

## 2018-11-02 DIAGNOSIS — Z6835 Body mass index (BMI) 35.0-35.9, adult: Secondary | ICD-10-CM | POA: Diagnosis not present

## 2018-11-02 DIAGNOSIS — J45909 Unspecified asthma, uncomplicated: Secondary | ICD-10-CM | POA: Diagnosis present

## 2018-11-02 DIAGNOSIS — Z79891 Long term (current) use of opiate analgesic: Secondary | ICD-10-CM

## 2018-11-02 DIAGNOSIS — K219 Gastro-esophageal reflux disease without esophagitis: Secondary | ICD-10-CM | POA: Diagnosis present

## 2018-11-02 DIAGNOSIS — Z66 Do not resuscitate: Secondary | ICD-10-CM | POA: Diagnosis present

## 2018-11-02 DIAGNOSIS — B351 Tinea unguium: Secondary | ICD-10-CM | POA: Diagnosis present

## 2018-11-02 DIAGNOSIS — Z79899 Other long term (current) drug therapy: Secondary | ICD-10-CM | POA: Diagnosis not present

## 2018-11-02 DIAGNOSIS — N179 Acute kidney failure, unspecified: Secondary | ICD-10-CM | POA: Diagnosis present

## 2018-11-02 LAB — CBC WITH DIFFERENTIAL/PLATELET
Abs Immature Granulocytes: 0.4 10*3/uL — ABNORMAL HIGH (ref 0.00–0.07)
Band Neutrophils: 18 %
Basophils Absolute: 0 10*3/uL (ref 0.0–0.1)
Basophils Relative: 0 %
Eosinophils Absolute: 0 10*3/uL (ref 0.0–0.5)
Eosinophils Relative: 0 %
HCT: 42.7 % (ref 36.0–46.0)
Hemoglobin: 12.8 g/dL (ref 12.0–15.0)
Lymphocytes Relative: 5 %
Lymphs Abs: 0.7 10*3/uL (ref 0.7–4.0)
MCH: 31.1 pg (ref 26.0–34.0)
MCHC: 30 g/dL (ref 30.0–36.0)
MCV: 103.9 fL — ABNORMAL HIGH (ref 80.0–100.0)
Monocytes Absolute: 0.4 10*3/uL (ref 0.1–1.0)
Monocytes Relative: 3 %
Myelocytes: 3 %
Neutro Abs: 12.4 10*3/uL — ABNORMAL HIGH (ref 1.7–7.7)
Neutrophils Relative %: 71 %
Platelets: 174 10*3/uL (ref 150–400)
RBC: 4.11 MIL/uL (ref 3.87–5.11)
RDW: 15.6 % — ABNORMAL HIGH (ref 11.5–15.5)
WBC: 13.9 10*3/uL — ABNORMAL HIGH (ref 4.0–10.5)
nRBC: 0.4 % — ABNORMAL HIGH (ref 0.0–0.2)

## 2018-11-02 LAB — BLOOD GAS, ARTERIAL
Acid-Base Excess: 1.5 mmol/L (ref 0.0–2.0)
Allens test (pass/fail): POSITIVE — AB
Bicarbonate: 27.2 mmol/L (ref 20.0–28.0)
FIO2: 100
O2 Saturation: 90.1 %
Patient temperature: 37
pCO2 arterial: 46 mmHg (ref 32.0–48.0)
pH, Arterial: 7.38 (ref 7.350–7.450)
pO2, Arterial: 60 mmHg — ABNORMAL LOW (ref 83.0–108.0)

## 2018-11-02 LAB — BASIC METABOLIC PANEL
Anion gap: 14 (ref 5–15)
BUN: 36 mg/dL — ABNORMAL HIGH (ref 8–23)
CO2: 24 mmol/L (ref 22–32)
Calcium: 8.2 mg/dL — ABNORMAL LOW (ref 8.9–10.3)
Chloride: 103 mmol/L (ref 98–111)
Creatinine, Ser: 1.71 mg/dL — ABNORMAL HIGH (ref 0.44–1.00)
GFR calc Af Amer: 37 mL/min — ABNORMAL LOW (ref >60)
GFR calc non Af Amer: 32 mL/min — ABNORMAL LOW (ref >60)
Glucose, Bld: 219 mg/dL — ABNORMAL HIGH (ref 70–99)
Potassium: 4.7 mmol/L (ref 3.5–5.1)
Sodium: 141 mmol/L (ref 135–145)

## 2018-11-02 LAB — HEPATIC FUNCTION PANEL
ALT: 31 U/L (ref 0–44)
AST: 60 U/L — ABNORMAL HIGH (ref 15–41)
Albumin: 2.7 g/dL — ABNORMAL LOW (ref 3.5–5.0)
Alkaline Phosphatase: 45 U/L (ref 38–126)
Bilirubin, Direct: 0.3 mg/dL — ABNORMAL HIGH (ref 0.0–0.2)
Indirect Bilirubin: 0.4 mg/dL (ref 0.3–0.9)
Total Bilirubin: 0.7 mg/dL (ref 0.3–1.2)
Total Protein: 7.3 g/dL (ref 6.5–8.1)

## 2018-11-02 LAB — FERRITIN: Ferritin: 1216 ng/mL — ABNORMAL HIGH (ref 11–307)

## 2018-11-02 LAB — SARS CORONAVIRUS 2 BY RT PCR (HOSPITAL ORDER, PERFORMED IN ~~LOC~~ HOSPITAL LAB): SARS Coronavirus 2: POSITIVE — AB

## 2018-11-02 LAB — TROPONIN I (HIGH SENSITIVITY)
Troponin I (High Sensitivity): 321 ng/L (ref <18)
Troponin I (High Sensitivity): 472 ng/L (ref <18)

## 2018-11-02 LAB — PROCALCITONIN: Procalcitonin: 3.19 ng/mL

## 2018-11-02 LAB — FIBRIN DERIVATIVES D-DIMER (ARMC ONLY): Fibrin derivatives D-dimer (ARMC): 6365.56 ng/mL (FEU) — ABNORMAL HIGH (ref 0.00–499.00)

## 2018-11-02 LAB — BRAIN NATRIURETIC PEPTIDE: B Natriuretic Peptide: 230 pg/mL — ABNORMAL HIGH (ref 0.0–100.0)

## 2018-11-02 MED ORDER — SODIUM CHLORIDE 0.9 % IV SOLN
100.0000 mg | INTRAVENOUS | Status: DC
  Filled 2018-11-02: qty 20

## 2018-11-02 MED ORDER — DEXAMETHASONE SODIUM PHOSPHATE 10 MG/ML IJ SOLN
6.0000 mg | Freq: Every day | INTRAMUSCULAR | Status: DC
  Administered 2018-11-03: 6 mg via INTRAVENOUS
  Filled 2018-11-02: qty 0.6

## 2018-11-02 MED ORDER — SODIUM CHLORIDE 0.9 % IV SOLN
200.0000 mg | Freq: Once | INTRAVENOUS | Status: AC
  Administered 2018-11-02: 200 mg via INTRAVENOUS
  Filled 2018-11-02: qty 40

## 2018-11-02 MED ORDER — ENOXAPARIN SODIUM 100 MG/ML ~~LOC~~ SOLN
1.0000 mg/kg | Freq: Once | SUBCUTANEOUS | Status: AC
  Administered 2018-11-02: 100 mg via SUBCUTANEOUS
  Filled 2018-11-02: qty 1

## 2018-11-02 MED ORDER — DEXAMETHASONE SODIUM PHOSPHATE 10 MG/ML IJ SOLN
10.0000 mg | Freq: Once | INTRAMUSCULAR | Status: AC
  Administered 2018-11-02: 10 mg via INTRAVENOUS
  Filled 2018-11-02: qty 1

## 2018-11-02 MED ORDER — SODIUM CHLORIDE 0.9 % IV SOLN
Freq: Once | INTRAVENOUS | Status: AC
  Administered 2018-11-02: 17:00:00 via INTRAVENOUS

## 2018-11-02 NOTE — ED Notes (Signed)
Pt with increased work of breathing. md notified, order for bipap received.

## 2018-11-02 NOTE — Consult Note (Signed)
MEDICATION RELATED CONSULT NOTE - INITIAL   Pharmacy Consult for Remdesivir Indication: COVID positive, requiring supplemental oxygen, evidence of lower respiratory infection on chest imaging  No Known Allergies  Patient Measurements: Height: 5\' 6"  (167.6 cm) Weight: 220 lb (99.8 kg) IBW/kg (Calculated) : 59.3  Vital Signs: Temp: 97.3 F (36.3 C) (10/17 1611) Temp Source: Axillary (10/17 1611) BP: 100/76 (10/17 1930) Pulse Rate: 103 (10/17 1832) Intake/Output from previous day: No intake/output data recorded. Intake/Output from this shift: No intake/output data recorded.  Labs: Recent Labs    11/02/18 1605 11/02/18 1911  WBC 13.9*  --   HGB 12.8  --   HCT 42.7  --   PLT 174  --   CREATININE 1.71*  --   ALBUMIN  --  2.7*  PROT  --  7.3  AST  --  60*  ALT  --  31  ALKPHOS  --  45  BILITOT  --  0.7  BILIDIR  --  0.3*  IBILI  --  0.4   Estimated Creatinine Clearance: 40.7 mL/min (A) (by C-G formula based on SCr of 1.71 mg/dL (H)).   Microbiology: Recent Results (from the past 720 hour(s))  SARS Coronavirus 2 by RT PCR (hospital order, performed in [facility] hospital lab) Nasopharyngeal Nasopharyngeal Swab     Status: Abnormal   Collection Time: 11/02/18  4:45 PM   Specimen: Nasopharyngeal Swab  Result Value Ref Range Status   SARS Coronavirus 2 POSITIVE (A) NEGATIVE Final    Comment: RESULT CALLED TO, READ BACK BY AND VERIFIED WITH: SMITH,A AT 1813 ON 11/02/2018 BY JPM (NOTE) If result is NEGATIVE SARS-CoV-2 target nucleic acids are NOT DETECTED. The SARS-CoV-2 RNA is generally detectable in upper and lower  respiratory specimens during the acute phase of infection. The lowest  concentration of SARS-CoV-2 viral copies this assay can detect is 250  copies / mL. A negative result does not preclude SARS-CoV-2 infection  and should not be used as the sole basis for treatment or other  patient management decisions.  A negative result may occur with  improper  specimen collection / handling, submission of specimen other  than nasopharyngeal swab, presence of viral mutation(s) within the  areas targeted by this assay, and inadequate number of viral copies  (<250 copies / mL). A negative result must be combined with clinical  observations, patient history, and epidemiological information. If result is POSITIVE SARS-CoV-2 target nucleic acids are DETECTED.  The SARS-CoV-2 RNA is generally detectable in upper and lower  respiratory specimens during the acute phase of infection.  Positive  results are indicative of active infection with SARS-CoV-2.  Clinical  correlation with patient history and other diagnostic information is  necessary to determine patient infection status.  Positive results do  not rule out bacterial infection or co-infection with other viruses. If result is PRESUMPTIVE POSTIVE SARS-CoV-2 nucleic acids MAY BE PRESENT.   A presumptive positive result was obtained on the submitted specimen  and confirmed on repeat testing.  While 2019 novel coronavirus  (SARS-CoV-2) nucleic acids may be present in the submitted sample  additional confirmatory testing may be necessary for epidemiological  and / or clinical management purposes  to differentiate between  SARS-CoV-2 and other Sarbecovirus currently known to infect humans.  If clinically indicated additional testing with an alternate test  methodology [phone]) i s advised. The SARS-CoV-2 RNA is generally  detectable in upper and lower respiratory specimens during the acute  phase of infection. The expected result  is Negative. Fact Sheet for Patients:  BoilerBrush.com.cy Fact Sheet for Healthcare Providers: https://pope.com/ This test is not yet approved or cleared by the [place] FDA and has been authorized for detection and/or diagnosis of SARS-CoV-2 by FDA under an Emergency Use Authorization (EUA).  This EUA will remain in  effect (meaning this test can be used) for the duration of the COVID-19 declaration under Section 564(b)(1) of the Act, 21 U.S.C. section 360bbb-3(b)(1), unless the authorization is terminated or revoked sooner. Performed at [facility], [address] [place], [place] [address]     Medical History: Past Medical History:  Diagnosis Date  . Arthritis    lower spine  . Asthma   . Diastolic congestive heart failure (HCC)    "only has problems with bad colds"  . Down's syndrome   . Full dentures    does not wear  . GERD (gastroesophageal reflux disease)   . Hypothyroidism   . Onychomycosis     Assessment: 63 yo female COVID positive, requiring supplemental oxygen, evidence of lower respiratory infection on chest imaging  Goal of Therapy:  Improvement of symptoms  Plan:  Remdesivir 200mg  now, followed by 100mg  daily for 4 days  , PharmD, BCPS Clinical Pharmacist 11/02/2018 8:39 PM

## 2018-11-02 NOTE — ED Notes (Addendum)
Dr. Chen at bedside.

## 2018-11-02 NOTE — ED Notes (Signed)
Respiratory at bedside.

## 2018-11-02 NOTE — ED Notes (Signed)
Dr. Goodman at bedside.  

## 2018-11-02 NOTE — ED Notes (Signed)
RT here to place pt on bipap 

## 2018-11-02 NOTE — ED Notes (Signed)
X-ray at bedside

## 2018-11-02 NOTE — ED Notes (Signed)
Report given to April, RN

## 2018-11-02 NOTE — ED Notes (Signed)
Dr Bridgett Larsson notified of troponin 472

## 2018-11-02 NOTE — ED Notes (Signed)
Pt repositioned in bed and cleansed of urine- purwick placed

## 2018-11-02 NOTE — ED Notes (Signed)
Per MOST form- sisters cell phone number is [phone]- Jilda Roche, sister is legal guardian

## 2018-11-02 NOTE — ED Notes (Signed)
Called respiratory for ABG collection

## 2018-11-02 NOTE — ED Notes (Signed)
Went to check on pt and pt was slid down in bed without nonrebreather on- Jessica RN assisted to get pt back up in bed, ecg leads replaced, nonrebreather back on, and pulse ox back on finger

## 2018-11-02 NOTE — ED Notes (Signed)
Went to room to give medications and noticed blood on pts hand- pt had removed IV from hand- hand cleaned of blood

## 2018-11-02 NOTE — ED Provider Notes (Signed)
[facility] Emergency Department Provider Note  ____________________________________________   I have reviewed the triage vital signs and the nursing notes.   HISTORY  Chief Complaint Respiratory Distress   History limited by and level 5 caveat due to: Respiratory distress, AMS  HPI Lori Petersen is a 63 y.o. female who presents to the emergency department today because of concern for respiratory distress. The patient is coming from [facility]. She had tested positive for COVID a few days ago when they did campus wide testing due to an outbreak. Per her physician she had been asymptomatic until last night when they had to put her on some oxygen for slightly low oxygen levels. Today however the patient was found to be altered with low oxygen saturation. Patient was put on non rebreather by EMS. The patient herself cannot give any significant history at this time.    Records reviewed. Per MOST form from facility in the event of COVID no ICU or intubation is desired.   Past Medical History:  Diagnosis Date  . Arthritis    lower spine  . Asthma   . Diastolic congestive heart failure ([place])    "only has problems with bad colds"  . Down's syndrome   . Full dentures    does not wear  . GERD (gastroesophageal reflux disease)   . Hypothyroidism   . Onychomycosis     Patient Active Problem List   Diagnosis Date Noted  . Left tibial fracture 04/27/2017  . Right tibial fracture 04/27/2017  . Problems with swallowing and mastication   . Blood in stool   . Bradycardia 05/14/2015  . Mixed Alzheimer's and vascular dementia ([place]) 09/14/2014  . Onychomycosis     Past Surgical History:  Procedure Laterality Date  . BREAST BIOPSY Left 04/05/2016   neg. cylinder clip  . BREAST BIOPSY Left 12/06/2016   ribbon clip. path pending  . COLONOSCOPY WITH PROPOFOL N/A 04/24/2016   Procedure: COLONOSCOPY WITH PROPOFOL;  Surgeon: Lucilla Lame, MD;  Location: [place];  Service: Endoscopy;  Laterality: N/A;  Leave patient at 10:25 due to transportation  . ESOPHAGOGASTRODUODENOSCOPY (EGD) WITH PROPOFOL N/A 04/24/2016   Procedure: ESOPHAGOGASTRODUODENOSCOPY (EGD) WITH PROPOFOL;  Surgeon: Lucilla Lame, MD;  Location: [place];  Service: Endoscopy;  Laterality: N/A;  . HERNIA REPAIR      Prior to Admission medications   Medication Sig Start Date End Date Taking? Authorizing Provider  acetaminophen (TYLENOL) 325 MG tablet Take 2 tablets (650 mg total) by mouth every 6 (six) hours as needed for mild pain or moderate pain (or Fever >/= 101). 04/30/17   Loletha Grayer, MD  albuterol (ACCUNEB) 0.63 MG/3ML nebulizer solution Take 1 ampule by nebulization every 6 (six) hours as needed for wheezing.    [provider]  budesonide (PULMICORT) 0.25 MG/2ML nebulizer solution Take 0.25 mg by nebulization 2 (two) times daily as needed.     [provider]  docusate sodium (COLACE) 100 MG capsule Take 1 capsule (100 mg total) by mouth 2 (two) times daily. 04/30/17   Loletha Grayer, MD  furosemide (LASIX) 20 MG tablet Take 1 tablet by mouth daily.     [provider]  HYDROcodone-acetaminophen (NORCO/VICODIN) 5-325 MG tablet Take 1 tablet by mouth every 6 (six) hours as needed for severe pain. 04/30/17   Loletha Grayer, MD  hydrocortisone (ANUSOL-HC) 25 MG suppository Place 1 suppository (25 mg total) rectally 2 (two) times daily. 04/27/16   Lucilla Lame, MD  levothyroxine (SYNTHROID, LEVOTHROID) 100 MCG tablet Take 100 mcg by mouth daily.     [provider]  memantine (NAMENDA) 10 MG tablet Take 1 tablet by mouth 2 (two) times daily. 02/26/17   [provider]  potassium chloride SA (K-DUR,KLOR-CON) 20 MEQ tablet Take 20 mEq by mouth daily.  04/30/13   [provider]  pravastatin (PRAVACHOL) 40 MG tablet Take 40 mg by mouth daily.    [provider]  Vitamin D, Ergocalciferol, (DRISDOL) 50000  units CAPS capsule Take 50,000 Units by mouth every 7 (seven) days.    [provider]    Allergies Patient has no known allergies.  No family history on file.  Social History Social History   Tobacco Use  . Smoking status: Never Smoker  . Smokeless tobacco: Never Used  Substance Use Topics  . Alcohol use: No  . Drug use: Not Currently    Review of Systems Unable to obtain reliable ROS.   ____________________________________________   PHYSICAL EXAM:  VITAL SIGNS: ED Triage Vitals  Enc Vitals Group     BP 11/02/18 1601 114/77     Pulse Rate 11/02/18 1601 (!) 109     Resp 11/02/18 1601 (!) 44     Temp 11/02/18 1611 (!) 97.3 F (36.3 C)     Temp Source 11/02/18 1611 Axillary     SpO2 11/02/18 1601 90 %     Weight 11/02/18 1602 220 lb (99.8 kg)     Height 11/02/18 1602 5\' 6"  (1.676 m)   Constitutional: Awake and alert.  Eyes: Conjunctivae are normal.  ENT      Head: Normocephalic and atraumatic.      Nose: No congestion/rhinnorhea.      Mouth/Throat: Mucous membranes are moist.      Neck: No stridor. Hematological/Lymphatic/Immunilogical: No cervical lymphadenopathy. Cardiovascular: Tachycardia, regular rhythm.  No murmurs, rubs, or gallops.  Respiratory: Tachypnea, increased respiratory effort. Diffuse rhonchi.  Gastrointestinal: Soft and non tender. No rebound. No guarding.  Genitourinary: Deferred Musculoskeletal: Normal range of motion in all extremities. No lower extremity edema. Neurologic:  Awake and alert.  Skin:  Skin is warm, dry and intact. No rash noted. ____________________________________________    LABS (pertinent positives/negatives)  CBC wbc 13.9, hgb 12.8, plt 174 D-dimer 6365 COVID positive BMP na 141, k 4.7, glu 219, cr 1.71, ca 8.2 ____________________________________________   EKG  I, , attending physician, personally viewed and interpreted this EKG  EKG Time: 1609 Rate: 110 Rhythm: sinus  tachycardia Axis: normal Intervals: qtc 412 QRS: narrow ST changes: no st elevation Impression: abnormal ekg  ____________________________________________    RADIOLOGY  CXR Cardiomegaly with bilateral airspace opacities  ____________________________________________   PROCEDURES  Procedures  ____________________________________________   INITIAL IMPRESSION / ASSESSMENT AND PLAN / ED COURSE  Pertinent labs & imaging results that were available during my care of the patient were reviewed by me and considered in my medical decision making (see chart for details).   Patient presented to the emergency department today because of concerns for redness of breath in the setting of recent Covid positive test.  Patient is coming from living facility where they have had an outbreak of Covid.  On exam patient is in respiratory distress with tachypnea and diffuse rhonchi.  Oxygen saturation is adequate on nonrebreather.  Chest x-ray is consistent with Covid pneumonia.  Patient was given dose of Decadron.  Did discuss with [facility] for transfer however they do not have any current beds.  Will  plan admission to the hospital here.  ____________________________________________   FINAL CLINICAL IMPRESSION(S) / ED DIAGNOSES  Final diagnoses:  Respiratory distress  COVID-19     Note: This dictation was prepared with Dragon dictation. Any transcriptional errors that result from this process are unintentional     Phineas SemenGoodman, Stanislawa Gaffin, MD 11/02/18 2020

## 2018-11-02 NOTE — ED Triage Notes (Addendum)
Pt arrives via EMS from [facility] after testing positive for Covid Tuesday- about 30 minutes before calling EMS pt was "fine"- pt was then found to be breathing heavily- pt was found to be 35% on 4L- per EMS pt was 85% on 15L nonrebreather- pt tachypnic

## 2018-11-02 NOTE — H&P (Addendum)
[place] [facility] [place] NAME: Lori Petersen    MR#:  [id number]  DATE OF BIRTH:  Nov 06, 1955  DATE OF ADMISSION:  11/02/2018  PRIMARY CARE PHYSICIAN: [facility] [place]   REQUESTING/REFERRING PHYSICIAN: Dr. Archie Balboa.  CHIEF COMPLAINT:   Chief Complaint  Patient presents with  . Respiratory Distress   Respiratory distress. HISTORY OF PRESENT ILLNESS:  Lori Petersen  is a 63 y.o. female with a known history of multiple medical problems as below.  The patient is sent from [facility] to ED due to above chief complaint.  The patient had a positive core weight test a few days ago.  She has been symptomatic until last night when they had to put her on some oxygen for hypoxia.  The patient iss found altered mental status and the low oxygen saturation today and put on nonrebreather by EMS.  The patient has Down syndrome and is unable to provide any history.  Chest x-ray show bilateral pneumonia.  ED physician request admission since no bed is available in [place].  Dr. Archie Balboa discussed with the patient's primary care physician Dr. Wilford Grist, who said the patient is in DNR status. PAST MEDICAL HISTORY:   Past Medical History:  Diagnosis Date  . Arthritis    lower spine  . Asthma   . Diastolic congestive heart failure ([place])    "only has problems with bad colds"  . Down's syndrome   . Full dentures    does not wear  . GERD (gastroesophageal reflux disease)   . Hypothyroidism   . Onychomycosis     PAST SURGICAL HISTORY:   Past Surgical History:  Procedure Laterality Date  . BREAST BIOPSY Left 04/05/2016   neg. cylinder clip  . BREAST BIOPSY Left 12/06/2016   ribbon clip. path pending  . COLONOSCOPY WITH PROPOFOL N/A 04/24/2016   Procedure: COLONOSCOPY WITH PROPOFOL;  Surgeon: Lucilla Lame, MD;  Location: [place];  Service: Endoscopy;  Laterality: N/A;  Leave patient at 10:25 due to transportation  .  ESOPHAGOGASTRODUODENOSCOPY (EGD) WITH PROPOFOL N/A 04/24/2016   Procedure: ESOPHAGOGASTRODUODENOSCOPY (EGD) WITH PROPOFOL;  Surgeon: Lucilla Lame, MD;  Location: [place];  Service: Endoscopy;  Laterality: N/A;  . HERNIA REPAIR      SOCIAL HISTORY:   Social History   Tobacco Use  . Smoking status: Never Smoker  . Smokeless tobacco: Never Used  Substance Use Topics  . Alcohol use: No    FAMILY HISTORY:  No family history on file.  Unable to obtain.  DRUG ALLERGIES:  No Known Allergies  REVIEW OF SYSTEMS:   Review of Systems  Unable to perform ROS: Mental acuity    MEDICATIONS AT HOME:   Prior to Admission medications   Medication Sig Start Date End Date Taking? Authorizing Provider  acetaminophen (TYLENOL) 325 MG tablet Take 2 tablets (650 mg total) by mouth every 6 (six) hours as needed for mild pain or moderate pain (or Fever >/= 101). 04/30/17   Loletha Grayer, MD  albuterol (ACCUNEB) 0.63 MG/3ML nebulizer solution Take 1 ampule by nebulization every 6 (six) hours as needed for wheezing.    [provider]  budesonide (PULMICORT) 0.25 MG/2ML nebulizer solution Take 0.25 mg by nebulization 2 (two) times daily as needed.     [provider]  docusate sodium (COLACE) 100 MG capsule Take 1 capsule (100 mg total) by mouth 2 (two) times daily. 04/30/17   Loletha Grayer, MD  furosemide (LASIX) 20 MG tablet Take 1 tablet by mouth daily.     [provider]  HYDROcodone-acetaminophen (NORCO/VICODIN) 5-325 MG tablet Take 1 tablet by mouth every 6 (six) hours as needed for severe pain. 04/30/17   Alford HighlandWieting, Richard, MD  hydrocortisone (ANUSOL-HC) 25 MG suppository Place 1 suppository (25 mg total) rectally 2 (two) times daily. 04/27/16   Midge MiniumWohl, Darren, MD  levothyroxine (SYNTHROID, LEVOTHROID) 100 MCG tablet Take 100 mcg by mouth daily.     [provider]  memantine (NAMENDA) 10 MG tablet Take 1 tablet by mouth 2 (two) times daily. 02/26/17    [provider]  potassium chloride SA (K-DUR,KLOR-CON) 20 MEQ tablet Take 20 mEq by mouth daily.  04/30/13   [provider]  pravastatin (PRAVACHOL) 40 MG tablet Take 40 mg by mouth daily.    [provider]  Vitamin D, Ergocalciferol, (DRISDOL) 50000 units CAPS capsule Take 50,000 Units by mouth every 7 (seven) days.    [provider]      VITAL SIGNS:  Blood pressure 100/76, pulse (!) 103, temperature (!) 97.3 F (36.3 C), temperature source Axillary, resp. rate (!) 43, height 5\' 6"  (1.676 m), weight 99.8 kg, SpO2 100 %.  PHYSICAL EXAMINATION:  Physical Exam  GENERAL:  63 y.o.-year-old patient lying in the bed with no acute distress.  EYES: Pupils equal, round, reactive to light and accommodation. No scleral icterus. Extraocular muscles intact.  HEENT: Head atraumatic, normocephalic. NECK:  Supple, no jugular venous distention. No thyroid enlargement, no tenderness.  LUNGS: Bilateral crackles and rhonchi, no wheezing, rales,rhonchi or crepitation.  Mild use of accessory muscles of respiration.  CARDIOVASCULAR: S1, S2 normal. No murmurs, rubs, or gallops.  ABDOMEN: Soft, nontender, nondistended. Bowel sounds present. No organomegaly or mass.  EXTREMITIES: No pedal edema, cyanosis, or clubbing.  NEUROLOGIC: Unable to exam. PSYCHIATRIC: The patient is confused. SKIN: No obvious rash, lesion, or ulcer.   LABORATORY PANEL:   CBC Recent Labs  Lab 11/02/18 1605  WBC 13.9*  HGB 12.8  HCT 42.7  PLT 174   ------------------------------------------------------------------------------------------------------------------  Chemistries  Recent Labs  Lab 11/02/18 1605  NA 141  K 4.7  CL 103  CO2 24  GLUCOSE 219*  BUN 36*  CREATININE 1.71*  CALCIUM 8.2*   ------------------------------------------------------------------------------------------------------------------  Cardiac Enzymes No results for input(s): TROPONINI in the last 168  hours. ------------------------------------------------------------------------------------------------------------------  RADIOLOGY:  Dg Chest Portable 1 View  Result Date: 11/02/2018 CLINICAL DATA:  Acute shortness of breath. COVID-19 positive. EXAM: PORTABLE CHEST 1 VIEW COMPARISON:  01/04/2018 FINDINGS: Cardiomegaly and new bilateral airspace opacities are noted. No definite pleural effusion or pneumothorax. No acute bony abnormalities are present. IMPRESSION: Cardiomegaly with new bilateral airspace opacities - question edema and/or infection/pneumonia. Electronically Signed   By: Harmon PierJeffrey  Hu M.D.   On: 11/02/2018 17:06      IMPRESSION AND PLAN:   Acute respiratory failure with hypoxia due to bilateral pneumonia with positive COVID-19 test. The patient will be admitted to medical floor. Continue nonrebreather oxygen, albuterol every 6 hours, remdesivir pharmacy to dose.  Dexamethasone.  Robitussin as needed.  Follow-up CBC.    According to Dr. Arta SilenceMouw, no ICU but can put on BiPAP and transfer to stepdown as neeed. Pulmonary consult.  [place] with Dr. Karna ChristmasAleskerov.  Acute renal failure due to dehydration.  IV fluid support and follow-up BMP.  Hold Lasix.  Elevated troponin, possible due to demanding ischemia secondary to above.  On aspirin and continue Pravachol.  Chronic diastolic CHF.  Hold Lasix due to renal failure.  I called the patient's sister, but nobody answered the phone. I discussed with the patient's primary care physician Dr. Arta Silence and confirmed that the patient is in DNR status.  According to Dr. Arta Silence, no ICU but can put on BiPAP and transfer to stepdown if necessary. All the records are reviewed and case discussed with ED provider. Management plans discussed with the patient, family and they are in agreement.  CODE STATUS: DNR.  TOTAL CRITICAL TIME TAKING CARE OF THIS PATIENT: 75 minutes.    Shaune Pollack M.D on 11/02/2018 at 7:53 PM  Between 7am to 6pm - Pager -  [phone]  After 6pm go to [email or web address] - Scientist, research (life sciences)  Hospitalists  Office  [phone]  CC: Primary care physician; [facility]   Note: This dictation was prepared with Nurse, children's dictation along with smaller phrase technology. Any transcriptional errors that result from this process are unin

## 2018-11-02 NOTE — ED Notes (Signed)
Attempted to call legal guardian at home phone and cell phone numbers with no answer

## 2018-11-02 NOTE — ED Notes (Signed)
Pt now going to different floor. Attempt to call report to new floor without success.

## 2018-11-03 ENCOUNTER — Inpatient Hospital Stay: Admit: 2018-11-03 | Payer: Medicare (Managed Care)

## 2018-11-03 ENCOUNTER — Inpatient Hospital Stay (HOSPITAL_COMMUNITY)
Admission: AD | Admit: 2018-11-03 | Discharge: 2018-11-07 | DRG: 177 | Disposition: A | Discharge status: Skilled Nursing Facility | Payer: Medicare (Managed Care) | Source: Other Acute Inpatient Hospital | Attending: Internal Medicine | Admitting: Internal Medicine

## 2018-11-03 DIAGNOSIS — J9601 Acute respiratory failure with hypoxia: Secondary | ICD-10-CM | POA: Diagnosis present

## 2018-11-03 DIAGNOSIS — N179 Acute kidney failure, unspecified: Secondary | ICD-10-CM | POA: Diagnosis present

## 2018-11-03 DIAGNOSIS — J9621 Acute and chronic respiratory failure with hypoxia: Secondary | ICD-10-CM

## 2018-11-03 DIAGNOSIS — J1289 Other viral pneumonia: Secondary | ICD-10-CM | POA: Diagnosis present

## 2018-11-03 DIAGNOSIS — J9600 Acute respiratory failure, unspecified whether with hypoxia or hypercapnia: Secondary | ICD-10-CM

## 2018-11-03 DIAGNOSIS — Z66 Do not resuscitate: Secondary | ICD-10-CM | POA: Diagnosis present

## 2018-11-03 DIAGNOSIS — G309 Alzheimer's disease, unspecified: Secondary | ICD-10-CM | POA: Diagnosis present

## 2018-11-03 DIAGNOSIS — G9341 Metabolic encephalopathy: Secondary | ICD-10-CM

## 2018-11-03 DIAGNOSIS — Q909 Down syndrome, unspecified: Secondary | ICD-10-CM

## 2018-11-03 DIAGNOSIS — J45909 Unspecified asthma, uncomplicated: Secondary | ICD-10-CM | POA: Diagnosis present

## 2018-11-03 DIAGNOSIS — I110 Hypertensive heart disease with heart failure: Secondary | ICD-10-CM | POA: Diagnosis present

## 2018-11-03 DIAGNOSIS — E039 Hypothyroidism, unspecified: Secondary | ICD-10-CM | POA: Diagnosis present

## 2018-11-03 DIAGNOSIS — U071 COVID-19: Secondary | ICD-10-CM | POA: Diagnosis present

## 2018-11-03 DIAGNOSIS — I5032 Chronic diastolic (congestive) heart failure: Secondary | ICD-10-CM

## 2018-11-03 DIAGNOSIS — I10 Essential (primary) hypertension: Secondary | ICD-10-CM | POA: Diagnosis not present

## 2018-11-03 DIAGNOSIS — Z6835 Body mass index (BMI) 35.0-35.9, adult: Secondary | ICD-10-CM

## 2018-11-03 DIAGNOSIS — F0150 Vascular dementia without behavioral disturbance: Secondary | ICD-10-CM | POA: Diagnosis present

## 2018-11-03 DIAGNOSIS — R7989 Other specified abnormal findings of blood chemistry: Secondary | ICD-10-CM | POA: Diagnosis not present

## 2018-11-03 DIAGNOSIS — F0280 Dementia in other diseases classified elsewhere without behavioral disturbance: Secondary | ICD-10-CM | POA: Diagnosis present

## 2018-11-03 DIAGNOSIS — E66813 Obesity, class 3: Secondary | ICD-10-CM

## 2018-11-03 DIAGNOSIS — R0603 Acute respiratory distress: Secondary | ICD-10-CM | POA: Diagnosis not present

## 2018-11-03 DIAGNOSIS — I5033 Acute on chronic diastolic (congestive) heart failure: Secondary | ICD-10-CM | POA: Diagnosis present

## 2018-11-03 DIAGNOSIS — E785 Hyperlipidemia, unspecified: Secondary | ICD-10-CM | POA: Diagnosis present

## 2018-11-03 DIAGNOSIS — K219 Gastro-esophageal reflux disease without esophagitis: Secondary | ICD-10-CM | POA: Diagnosis present

## 2018-11-03 DIAGNOSIS — R0602 Shortness of breath: Secondary | ICD-10-CM | POA: Diagnosis present

## 2018-11-03 DIAGNOSIS — E870 Hyperosmolality and hypernatremia: Secondary | ICD-10-CM | POA: Diagnosis present

## 2018-11-03 LAB — CBC WITH DIFFERENTIAL/PLATELET
Abs Immature Granulocytes: 0.62 10*3/uL — ABNORMAL HIGH (ref 0.00–0.07)
Basophils Absolute: 0.1 10*3/uL (ref 0.0–0.1)
Basophils Relative: 1 %
Eosinophils Absolute: 0 10*3/uL (ref 0.0–0.5)
Eosinophils Relative: 0 %
HCT: 36.4 % (ref 36.0–46.0)
Hemoglobin: 11.4 g/dL — ABNORMAL LOW (ref 12.0–15.0)
Immature Granulocytes: 7 %
Lymphocytes Relative: 7 %
Lymphs Abs: 0.6 10*3/uL — ABNORMAL LOW (ref 0.7–4.0)
MCH: 31.8 pg (ref 26.0–34.0)
MCHC: 31.3 g/dL (ref 30.0–36.0)
MCV: 101.7 fL — ABNORMAL HIGH (ref 80.0–100.0)
Monocytes Absolute: 0.3 10*3/uL (ref 0.1–1.0)
Monocytes Relative: 3 %
Neutro Abs: 7.7 10*3/uL (ref 1.7–7.7)
Neutrophils Relative %: 82 %
Platelets: 156 10*3/uL (ref 150–400)
RBC: 3.58 MIL/uL — ABNORMAL LOW (ref 3.87–5.11)
RDW: 15.2 % (ref 11.5–15.5)
Smear Review: NORMAL
WBC: 9.3 10*3/uL (ref 4.0–10.5)
nRBC: 0.5 % — ABNORMAL HIGH (ref 0.0–0.2)

## 2018-11-03 LAB — URINALYSIS, COMPLETE (UACMP) WITH MICROSCOPIC
Bacteria, UA: NONE SEEN
Bilirubin Urine: NEGATIVE
Glucose, UA: NEGATIVE mg/dL
Ketones, ur: NEGATIVE mg/dL
Leukocytes,Ua: NEGATIVE
Nitrite: NEGATIVE
Protein, ur: 100 mg/dL — AB
Specific Gravity, Urine: 1.026 (ref 1.005–1.030)
pH: 5 (ref 5.0–8.0)

## 2018-11-03 LAB — COMPREHENSIVE METABOLIC PANEL
ALT: 31 U/L (ref 0–44)
AST: 55 U/L — ABNORMAL HIGH (ref 15–41)
Albumin: 2.6 g/dL — ABNORMAL LOW (ref 3.5–5.0)
Alkaline Phosphatase: 36 U/L — ABNORMAL LOW (ref 38–126)
Anion gap: 11 (ref 5–15)
BUN: 40 mg/dL — ABNORMAL HIGH (ref 8–23)
CO2: 27 mmol/L (ref 22–32)
Calcium: 7.5 mg/dL — ABNORMAL LOW (ref 8.9–10.3)
Chloride: 108 mmol/L (ref 98–111)
Creatinine, Ser: 1.31 mg/dL — ABNORMAL HIGH (ref 0.44–1.00)
GFR calc Af Amer: 50 mL/min — ABNORMAL LOW (ref >60)
GFR calc non Af Amer: 44 mL/min — ABNORMAL LOW (ref >60)
Glucose, Bld: 138 mg/dL — ABNORMAL HIGH (ref 70–99)
Potassium: 4.4 mmol/L (ref 3.5–5.1)
Sodium: 146 mmol/L — ABNORMAL HIGH (ref 135–145)
Total Bilirubin: 0.6 mg/dL (ref 0.3–1.2)
Total Protein: 6.7 g/dL (ref 6.5–8.1)

## 2018-11-03 LAB — FERRITIN: Ferritin: 1453 ng/mL — ABNORMAL HIGH (ref 11–307)

## 2018-11-03 LAB — ABO/RH: ABO/RH(D): A POS

## 2018-11-03 LAB — FIBRIN DERIVATIVES D-DIMER (ARMC ONLY): Fibrin derivatives D-dimer (ARMC): 4390.64 ng/mL (FEU) — ABNORMAL HIGH (ref 0.00–499.00)

## 2018-11-03 LAB — TROPONIN I (HIGH SENSITIVITY): Troponin I (High Sensitivity): 331 ng/L (ref <18)

## 2018-11-03 LAB — C-REACTIVE PROTEIN
CRP: 22.2 mg/dL — ABNORMAL HIGH (ref <1.0)
CRP: 22.2 mg/dL — ABNORMAL HIGH (ref <1.0)

## 2018-11-03 LAB — HIV ANTIBODY (ROUTINE TESTING W REFLEX): HIV Screen 4th Generation wRfx: NONREACTIVE

## 2018-11-03 LAB — PHOSPHORUS: Phosphorus: 3.4 mg/dL (ref 2.5–4.6)

## 2018-11-03 LAB — MAGNESIUM: Magnesium: 2.5 mg/dL — ABNORMAL HIGH (ref 1.7–2.4)

## 2018-11-03 LAB — MRSA PCR SCREENING: MRSA by PCR: NEGATIVE

## 2018-11-03 LAB — PROCALCITONIN: Procalcitonin: 4.32 ng/mL

## 2018-11-03 MED ORDER — ONDANSETRON HCL 4 MG PO TABS: 4.0000 mg | ORAL_TABLET | Freq: Four times a day (QID) | ORAL | Status: DC | PRN

## 2018-11-03 MED ORDER — DEXAMETHASONE SODIUM PHOSPHATE 10 MG/ML IJ SOLN: 6.0000 mg | INTRAMUSCULAR | Status: DC

## 2018-11-03 MED ORDER — SODIUM CHLORIDE 0.9 % IV SOLN
100.0000 mg | INTRAVENOUS | Status: AC
  Administered 2018-11-04 – 2018-11-06 (×3): 100 mg via INTRAVENOUS
  Filled 2018-11-03 (×3): qty 20

## 2018-11-03 MED ORDER — LACTATED RINGERS IV BOLUS
1000.0000 mL | Freq: Once | INTRAVENOUS | Status: AC
  Administered 2018-11-03: 1000 mL via INTRAVENOUS

## 2018-11-03 MED ORDER — TOCILIZUMAB 400 MG/20ML IV SOLN
800.0000 mg | Freq: Once | INTRAVENOUS | Status: AC
  Administered 2018-11-03: 800 mg via INTRAVENOUS
  Filled 2018-11-03: qty 40

## 2018-11-03 MED ORDER — ASPIRIN 81 MG PO CHEW
81.0000 mg | CHEWABLE_TABLET | Freq: Every day | ORAL | Status: DC
  Administered 2018-11-03 – 2018-11-07 (×4): 81 mg via ORAL
  Filled 2018-11-03 (×4): qty 1

## 2018-11-03 MED ORDER — BISACODYL 5 MG PO TBEC: 5.0000 mg | DELAYED_RELEASE_TABLET | Freq: Every day | ORAL | Status: DC | PRN

## 2018-11-03 MED ORDER — SODIUM CHLORIDE 0.9 % IV SOLN
100.0000 mg | INTRAVENOUS | Status: DC
  Filled 2018-11-03: qty 20

## 2018-11-03 MED ORDER — MEMANTINE HCL 5 MG PO TABS
10.0000 mg | ORAL_TABLET | Freq: Two times a day (BID) | ORAL | Status: DC
  Administered 2018-11-03: 10 mg via ORAL
  Filled 2018-11-03 (×3): qty 2

## 2018-11-03 MED ORDER — SODIUM CHLORIDE 0.9% FLUSH
3.0000 mL | Freq: Two times a day (BID) | INTRAVENOUS | Status: DC
  Administered 2018-11-03 – 2018-11-06 (×4): 3 mL via INTRAVENOUS

## 2018-11-03 MED ORDER — ALBUTEROL SULFATE HFA 108 (90 BASE) MCG/ACT IN AERS
2.0000 | INHALATION_SPRAY | Freq: Four times a day (QID) | RESPIRATORY_TRACT | Status: DC | PRN
  Administered 2018-11-03 – 2018-11-04 (×3): 2 via RESPIRATORY_TRACT
  Filled 2018-11-03: qty 6.7

## 2018-11-03 MED ORDER — SODIUM CHLORIDE 0.9 % IV SOLN
INTRAVENOUS | Status: DC
  Administered 2018-11-03: 800 mL via INTRAVENOUS

## 2018-11-03 MED ORDER — CHLORHEXIDINE GLUCONATE 0.12 % MT SOLN
15.0000 mL | Freq: Two times a day (BID) | OROMUCOSAL | Status: DC
  Administered 2018-11-03: 15 mL via OROMUCOSAL
  Filled 2018-11-03: qty 15

## 2018-11-03 MED ORDER — ONDANSETRON HCL 4 MG/2ML IJ SOLN: 4.0000 mg | Freq: Four times a day (QID) | INTRAMUSCULAR | Status: DC | PRN

## 2018-11-03 MED ORDER — SODIUM CHLORIDE 0.9 % IV SOLN
500.0000 mg | INTRAVENOUS | Status: DC
  Administered 2018-11-03: 500 mg via INTRAVENOUS
  Filled 2018-11-03 (×3): qty 500

## 2018-11-03 MED ORDER — HYDROCODONE-ACETAMINOPHEN 5-325 MG PO TABS: 1.0000 | ORAL_TABLET | ORAL | Status: DC | PRN

## 2018-11-03 MED ORDER — FAMOTIDINE IN NACL 20-0.9 MG/50ML-% IV SOLN
20.0000 mg | INTRAVENOUS | Status: DC
  Administered 2018-11-03: 20 mg via INTRAVENOUS
  Filled 2018-11-03: qty 50

## 2018-11-03 MED ORDER — IPRATROPIUM-ALBUTEROL 0.5-2.5 (3) MG/3ML IN SOLN
3.0000 mL | Freq: Four times a day (QID) | RESPIRATORY_TRACT | Status: DC
  Filled 2018-11-03: qty 3

## 2018-11-03 MED ORDER — SODIUM CHLORIDE 0.9 % IV SOLN
100.0000 mg | Freq: Once | INTRAVENOUS | Status: AC
  Administered 2018-11-03: 100 mg via INTRAVENOUS
  Filled 2018-11-03: qty 20

## 2018-11-03 MED ORDER — BUDESONIDE 0.25 MG/2ML IN SUSP: 0.2500 mg | Freq: Two times a day (BID) | RESPIRATORY_TRACT | Status: DC

## 2018-11-03 MED ORDER — FUROSEMIDE 10 MG/ML IJ SOLN
60.0000 mg | Freq: Once | INTRAMUSCULAR | Status: AC
  Administered 2018-11-03: 60 mg via INTRAVENOUS
  Filled 2018-11-03: qty 6

## 2018-11-03 MED ORDER — FLUTICASONE PROPIONATE HFA 110 MCG/ACT IN AERO
1.0000 | INHALATION_SPRAY | Freq: Two times a day (BID) | RESPIRATORY_TRACT | Status: DC
  Administered 2018-11-03: 1 via RESPIRATORY_TRACT
  Filled 2018-11-03: qty 12

## 2018-11-03 MED ORDER — ALBUTEROL SULFATE HFA 108 (90 BASE) MCG/ACT IN AERS
2.0000 | INHALATION_SPRAY | Freq: Four times a day (QID) | RESPIRATORY_TRACT | Status: DC
  Administered 2018-11-03 (×2): 2 via RESPIRATORY_TRACT
  Filled 2018-11-03: qty 6.7

## 2018-11-03 MED ORDER — ORAL CARE MOUTH RINSE
15.0000 mL | Freq: Two times a day (BID) | OROMUCOSAL | Status: DC
  Administered 2018-11-03: 15 mL via OROMUCOSAL

## 2018-11-03 MED ORDER — ENOXAPARIN SODIUM 40 MG/0.4ML ~~LOC~~ SOLN
40.0000 mg | SUBCUTANEOUS | Status: DC
  Administered 2018-11-03: 40 mg via SUBCUTANEOUS
  Filled 2018-11-03: qty 0.4

## 2018-11-03 MED ORDER — ACETAMINOPHEN 325 MG PO TABS: 650.0000 mg | ORAL_TABLET | Freq: Four times a day (QID) | ORAL | Status: DC | PRN

## 2018-11-03 MED ORDER — GUAIFENESIN-DM 100-10 MG/5ML PO SYRP: 10.0000 mL | ORAL_SOLUTION | ORAL | Status: DC | PRN

## 2018-11-03 MED ORDER — CHLORHEXIDINE GLUCONATE CLOTH 2 % EX PADS
6.0000 | MEDICATED_PAD | Freq: Every day | CUTANEOUS | Status: DC
  Administered 2018-11-03: 6 via TOPICAL

## 2018-11-03 MED ORDER — ASPIRIN EC 81 MG PO TBEC
81.0000 mg | DELAYED_RELEASE_TABLET | Freq: Every day | ORAL | Status: DC
  Filled 2018-11-03: qty 1

## 2018-11-03 MED ORDER — LEVOTHYROXINE SODIUM 100 MCG PO TABS
100.0000 ug | ORAL_TABLET | Freq: Every day | ORAL | Status: DC
  Administered 2018-11-03: 100 ug via ORAL
  Filled 2018-11-03: qty 1

## 2018-11-03 MED ORDER — DOCUSATE SODIUM 100 MG PO CAPS
100.0000 mg | ORAL_CAPSULE | Freq: Two times a day (BID) | ORAL | Status: DC
  Administered 2018-11-03: 100 mg via ORAL
  Filled 2018-11-03: qty 1

## 2018-11-03 MED ORDER — SODIUM CHLORIDE 0.9 % IV SOLN
1.0000 g | INTRAVENOUS | Status: DC
  Administered 2018-11-03: 1 g via INTRAVENOUS
  Filled 2018-11-03 (×2): qty 10
  Filled 2018-11-03: qty 1

## 2018-11-03 MED ORDER — METHYLPREDNISOLONE SODIUM SUCC 125 MG IJ SOLR
60.0000 mg | Freq: Two times a day (BID) | INTRAMUSCULAR | Status: DC
  Administered 2018-11-03 – 2018-11-05 (×4): 60 mg via INTRAVENOUS
  Filled 2018-11-03 (×4): qty 2

## 2018-11-03 MED ORDER — HYDROCOD POLST-CPM POLST ER 10-8 MG/5ML PO SUER: 5.0000 mL | Freq: Two times a day (BID) | ORAL | Status: DC | PRN

## 2018-11-03 MED ORDER — SENNOSIDES-DOCUSATE SODIUM 8.6-50 MG PO TABS: 1.0000 | ORAL_TABLET | Freq: Every evening | ORAL | Status: DC | PRN

## 2018-11-03 MED ORDER — CHLORHEXIDINE GLUCONATE CLOTH 2 % EX PADS
6.0000 | MEDICATED_PAD | Freq: Every day | CUTANEOUS | Status: DC
  Administered 2018-11-04 – 2018-11-07 (×4): 6 via TOPICAL

## 2018-11-03 MED ORDER — HEPARIN SODIUM (PORCINE) 5000 UNIT/ML IJ SOLN
5000.0000 [IU] | Freq: Three times a day (TID) | INTRAMUSCULAR | Status: DC
  Administered 2018-11-03 (×2): 5000 [IU] via SUBCUTANEOUS
  Filled 2018-11-03 (×2): qty 1

## 2018-11-03 MED ORDER — PRAVASTATIN SODIUM 40 MG PO TABS
40.0000 mg | ORAL_TABLET | Freq: Every day | ORAL | Status: DC
  Filled 2018-11-03: qty 1

## 2018-11-03 NOTE — Plan of Care (Signed)
  Problem: Education: Goal: Knowledge of risk factors and measures for prevention of condition will improve Outcome: Not Progressing   Problem: Respiratory: Goal: Will maintain a patent airway Outcome: Not Progressing

## 2018-11-03 NOTE — ED Notes (Signed)
Attempt to call report to unit, no answer.

## 2018-11-03 NOTE — Progress Notes (Signed)
Patient momentarily taken off of Bipap for transport to ICU. Patient placed on NRB mask with surgical mask covering. Patient maintained SATS 95% or higher for duration of transport. Patient placed back on Bipap once in ICU room. Patient tolerated transport well. Patient on previous Bipap settings, tolerating well at this time. Will continue to monitor.

## 2018-11-03 NOTE — Progress Notes (Signed)
   11/03/18 1715  Vitals  Temp 98.1 F (36.7 C)  Temp Source Oral  BP 126/71  MAP (mmHg) 87  BP Location Left Arm  BP Method Automatic  Patient Position (if appropriate) Lying  Pulse Rate 96  Pulse Rate Source Monitor  ECG Heart Rate 100  Resp (!) 40  Oxygen Therapy  SpO2 90 %  O2 Device HFNC;Non-rebreather Mask  O2 Flow Rate (L/min) 15 L/min  Pain Assessment  Pain Scale 0-10  Pain Score 0  Glasgow Coma Scale  Eye Opening 4  Best Verbal Response (NON-intubated) 4  Best Motor Response 6  [place] Coma Scale Score 14  MEWS Score  MEWS RR 3  MEWS Pulse 0  MEWS Systolic 0  MEWS LOC 0  MEWS Temp 0  MEWS Score 3  MEWS Score Color Yellow  MEWS Assessment  Is this an acute change? No   Patient arrived to [facility] room 130 at this time with the above VS. Placed on NRB and HFNC 15L. Accessory muscle use noted. Patient denies chest pain and SOB. Will closely monitor.

## 2018-11-03 NOTE — ED Notes (Signed)
Attempt to call report to [facility]. Floor where pt has been assigned is not able to take a pt on bipap and ICU beds are full per staff at [facility]. Charge Rn notified.

## 2018-11-03 NOTE — ED Notes (Signed)
Attempt to call report at this time, RN not available.

## 2018-11-03 NOTE — Consult Note (Signed)
CRITICAL CARE PROGRESS NOTE    Name: [place] MRN: [id number] DOB: 04-Nov-1955     LOS: 1   SUBJECTIVE FINDINGS & SIGNIFICANT EVENTS   Patient description:  63 yo female [facility]) brought in to ED in respiratory distress, found to have +COVID19 result.  Initially admitted by hospitalist service for altered mental status and hypoxemia. She was placed on nonrebreather,  Her CXR (pictorial documentation below) with bilateral airspace opacification consistent with PNA. CRP is >22,  ddimer >6300, ferritin >1400, PCT 4.32, HStrop->470, ABG with pAO2 60 on 100FIO2 suggestive of severe ARDS, no TTE to support CHF.      Lines / Drains: PIV   Cultures / Sepsis markers: Nasal MRSA and COVID  Blood cultures  Antibiotics: Zithromax & Rocephin    Protocols / Consultants: Plan for transport to [facility]  Tests / Events: TTE   Overnight: Patient is in shock -bp 70/50 - s/p NS + LR bolus Currently on HHFNC 50/50  PAST MEDICAL HISTORY   Past Medical History:  Diagnosis Date  . Arthritis    lower spine  . Asthma   . Diastolic congestive heart failure ([place])    "only has problems with bad colds"  . Down's syndrome   . Full dentures    does not wear  . GERD (gastroesophageal reflux disease)   . Hypothyroidism   . Onychomycosis      SURGICAL HISTORY   Past Surgical History:  Procedure Laterality Date  . BREAST BIOPSY Left 04/05/2016   neg. cylinder clip  . BREAST BIOPSY Left 12/06/2016   ribbon clip. path pending  . COLONOSCOPY WITH PROPOFOL N/A 04/24/2016   Procedure: COLONOSCOPY WITH PROPOFOL;  Surgeon: Lucilla Lame, MD;  Location: [facility];  Service: Endoscopy;  Laterality: N/A;  Leave patient at 10:25 due to transportation  . ESOPHAGOGASTRODUODENOSCOPY (EGD) WITH PROPOFOL N/A  04/24/2016   Procedure: ESOPHAGOGASTRODUODENOSCOPY (EGD) WITH PROPOFOL;  Surgeon: Lucilla Lame, MD;  Location: [facility];  Service: Endoscopy;  Laterality: N/A;  . HERNIA REPAIR       FAMILY HISTORY   No family history on file.   SOCIAL HISTORY   Social History   Tobacco Use  . Smoking status: Never Smoker  . Smokeless tobacco: Never Used  Substance Use Topics  . Alcohol use: No  . Drug use: Not Currently     MEDICATIONS   Current Medication:  Current Facility-Administered Medications:  .  0.9 %  sodium chloride infusion, , Intravenous, Continuous, Demetrios Loll, MD, Last Rate: 50 mL/hr at 11/03/18 0250, 800 mL at 11/03/18 0250 .  acetaminophen (TYLENOL) tablet 650 mg, 650 mg, Oral, Q6H PRN, Demetrios Loll, MD .  albuterol (VENTOLIN HFA) 108 (90 Base) MCG/ACT inhaler 2 puff, 2 puff, Inhalation, Q6H, Demetrios Loll, MD .  aspirin EC tablet 81 mg, 81 mg, Oral, Daily, Demetrios Loll, MD .  bisacodyl (DULCOLAX) EC tablet 5 mg, 5 mg, Oral, Daily PRN, Demetrios Loll, MD .  chlorhexidine (PERIDEX) 0.12 % solution 15 mL, 15 mL, Mouth Rinse, BID, Tukov-Yual, Magdalene S, NP .  Chlorhexidine Gluconate Cloth 2 % PADS 6 each, 6 each, Topical, Daily, Demetrios Loll, MD .  chlorpheniramine-HYDROcodone (TUSSIONEX) 10-8 MG/5ML suspension 5 mL, 5 mL, Oral, Q12H PRN, Demetrios Loll, MD .  dexamethasone (DECADRON) injection 6 mg, 6 mg, Intravenous, Daily, Nance Pear, MD .  docusate sodium (COLACE) capsule 100 mg, 100 mg, Oral, BID, Demetrios Loll, MD .  famotidine (PEPCID) IVPB 20 mg premix, 20  mg, Intravenous, Q24H, Tukov-Yual, Magdalene S, NP .  fluticasone (FLOVENT HFA) 110 MCG/ACT inhaler 1 puff, 1 puff, Inhalation, BID, Hall, Scott A, RPH .  guaiFENesin-dextromethorphan (ROBITUSSIN DM) 100-10 MG/5ML syrup 10 mL, 10 mL, Oral, Q4H PRN, Demetrios Loll, MD .  heparin injection 5,000 Units, 5,000 Units, Subcutaneous, Q8H, Demetrios Loll, MD, 5,000 Units at 11/03/18 [phone] .  HYDROcodone-acetaminophen (NORCO/VICODIN)  5-325 MG per tablet 1-2 tablet, 1-2 tablet, Oral, Q4H PRN, Demetrios Loll, MD .  levothyroxine (SYNTHROID) tablet 100 mcg, 100 mcg, Oral, Daily, Demetrios Loll, MD .  MEDLINE mouth rinse, 15 mL, Mouth Rinse, q12n4p, Tukov-Yual, Magdalene S, NP .  memantine (NAMENDA) tablet 10 mg, 10 mg, Oral, BID, Demetrios Loll, MD .  ondansetron [facility]) tablet 4 mg, 4 mg, Oral, Q6H PRN **OR** ondansetron (ZOFRAN) injection 4 mg, 4 mg, Intravenous, Q6H PRN, Demetrios Loll, MD .  pravastatin (PRAVACHOL) tablet 40 mg, 40 mg, Oral, q1800, Demetrios Loll, MD .  Margrett Rud remdesivir 200 mg in sodium chloride 0.9 % 250 mL IVPB, 200 mg, Intravenous, Once, Stopped at 11/02/18 2258 **FOLLOWED BY** remdesivir 100 mg in sodium chloride 0.9 % 250 mL IVPB, 100 mg, Intravenous, Q24H, Shanlever, Pierce Crane, RPH .  senna-docusate (Senokot-S) tablet 1 tablet, 1 tablet, Oral, QHS PRN, Demetrios Loll, MD    ALLERGIES   Patient has no known allergies.    REVIEW OF SYSTEMS     Unable to obtain due to Downs/ARDS  PHYSICAL EXAMINATION   Vital Signs: Temp:  [97.3 F (36.3 C)-97.7 F (36.5 C)] 97.7 F (36.5 C) (10/18 0230) Pulse Rate:  [75-109] 89 (10/18 0940) Resp:  [27-51] 32 (10/18 0940) BP: (91-124)/(66-96) 107/83 (10/18 0940) SpO2:  [84 %-100 %] 97 % (10/18 0940) Weight:  [99.8 kg] 99.8 kg (10/17 1602)  GENERAL:critically ill HEAD: Normocephalic, atraumatic.  EYES: Pupils equal, round, MOUTH: Moist mucosal membrane. NECK: Supple. No thyromegaly. No nodules. No JVD.  PULMONARY:tachypneic CARDIOVASCULAR: telemetry reviewed with bradycardia and PVcs GASTROINTESTINAL: Obese   MUSCULOSKELETAL: moving extermities x 4  NEUROLOGIC: Mild distress due to acute illness SKIN:intact  COVID-19 DISASTER DECLARATION:   FULL CONTACT PHYSICAL EXAMINATION WAS NOT POSSIBLE DUE TO TREATMENT OF COVID-19  AND CONSERVATION OF PERSONAL PROTECTIVE EQUIPMENT, LIMITED EXAM FINDINGS INCLUDE-    Patient assessed or the symptoms described in the  history of present illness.  In the context of the Global COVID-19 pandemic, which necessitated consideration that the patient might be at risk for infection with the SARS-CoV-2 virus that causes COVID-19, Institutional protocols and algorithms that pertain to the evaluation of patients at risk for COVID-19 are in a state of rapid change based on information released by regulatory bodies including the CDC and federal and state organizations. These policies and algorithms were followed during the patient's care while in hospital.    PERTINENT DATA     Infusions: . sodium chloride 800 mL (11/03/18 0250)  . famotidine (PEPCID) IV    . remdesivir 100 mg in NS 250 mL     Scheduled Medications: . albuterol  2 puff Inhalation Q6H  . aspirin EC  81 mg Oral Daily  . chlorhexidine  15 mL Mouth Rinse BID  . Chlorhexidine Gluconate Cloth  6 each Topical Daily  . dexamethasone (DECADRON) injection  6 mg Intravenous Daily  . docusate sodium  100 mg Oral BID  . fluticasone  1 puff Inhalation BID  . heparin  5,000 Units Subcutaneous Q8H  . levothyroxine  100 mcg Oral Daily  . mouth rinse  15  mL Mouth Rinse q12n4p  . memantine  10 mg Oral BID  . pravastatin  40 mg Oral q1800   PRN Medications: acetaminophen, bisacodyl, chlorpheniramine-HYDROcodone, guaiFENesin-dextromethorphan, HYDROcodone-acetaminophen, ondansetron **OR** ondansetron (ZOFRAN) IV, senna-docusate Hemodynamic parameters:   Intake/Output: 10/17 0701 - 10/18 0700 In: 1250 [I.V.:1000; IV Piggyback:250] Out: -   Ventilator  Settings:     LAB RESULTS:  Basic Metabolic Panel: Recent Labs  Lab 11/02/18 1605 11/03/18 0559  NA 141 146*  K 4.7 4.4  CL 103 108  CO2 24 27  GLUCOSE 219* 138*  BUN 36* 40*  CREATININE 1.71* 1.31*  CALCIUM 8.2* 7.5*  MG  --  2.5*  PHOS  --  3.4   Liver Function Tests: Recent Labs  Lab 11/02/18 1911 11/03/18 0559  AST 60* 55*  ALT 31 31  ALKPHOS 45 36*  BILITOT 0.7 0.6  PROT 7.3 6.7   ALBUMIN 2.7* 2.6*   No results for input(s): LIPASE, AMYLASE in the last 168 hours. No results for input(s): AMMONIA in the last 168 hours. CBC: Recent Labs  Lab 11/02/18 1605 11/03/18 0559  WBC 13.9* 9.3  NEUTROABS 12.4* 7.7  HGB 12.8 11.4*  HCT 42.7 36.4  MCV 103.9* 101.7*  PLT 174 156   Cardiac Enzymes: No results for input(s): CKTOTAL, CKMB, CKMBINDEX, TROPONINI in the last 168 hours. BNP: Invalid input(s): POCBNP CBG: No results for input(s): GLUCAP in the last 168 hours.   IMAGING RESULTS:  Imaging: Dg Chest Portable 1 View  Result Date: 11/02/2018 CLINICAL DATA:  Acute shortness of breath. COVID-19 positive. EXAM: PORTABLE CHEST 1 VIEW COMPARISON:  01/04/2018 FINDINGS: Cardiomegaly and new bilateral airspace opacities are noted. No definite pleural effusion or pneumothorax. No acute bony abnormalities are present. IMPRESSION: Cardiomegaly with new bilateral airspace opacities - question edema and/or infection/pneumonia. Electronically Signed   By: Margarette [place] M.D.   On: 11/02/2018 17:06      ASSESSMENT AND PLAN    -Multidisciplinary rounds held today  Acute Hypoxic Respiratory Failure Due to severe ARDS secondary to COVID 19 - currently on HHFNC -need central line with CVP trend -s/p LR bolus -will diurese when out of circulatory shock -continue Full MV support -continue Bronchodilator Therapy -Wean Fio2 and PEEP as tolerated -will perform SAT/SBT when respiratory parameters are met -empiric Rocephin /zithromax- Procal >4   Circulatory shock  - likely distributive due to CAP from [place] - consider [place] with IVC respirophasic changes to eval for fluid responsiveness - s/p LR bolus with increment in MAP -net +1250 - on Dexamethasone  -low threshold for vasopressor support -TTE ordered and pending -urine output unclear due to adult diaper on on arrival  -ABG repeat in am  ICU monitoring   COVID19 infection   - Remdesevir s/p 200X1 and 100X4d  -  Consider Actemra - CRP >22  - Dexamethasone  - VITc/zinc  -O2 - wean to spO2>88%  -MDI albuterol   - close monitoring high risk for clinical decline    Acute Renal Failure-KDIGO 2 -Oliguric  -likely due to transient hypotension while in shock - avoid hypotension - d/c nonessential nephrotoxins-asaprin -stat bladder scan for retention- only 30ccurien -follow chem 7 -follow UO -continue Foley Catheter-assess need daily- critically ill state -document urine output   Moderate protein calorie malnutrition   - albumin 2.6    - will likely worsen due to acutely ill state   - dietary evaluation    Transaminits  - monitor for worsening - d/c statin and tylenol for now  -  possible onset of shock liver - avoid hypotension  -repeat CMP in 6h   Hypothyroidism   - continue home synthroid   - telemetry monitoring    ID -continue IV abx as prescibed -follow up cultures   GI/Nutrition GI PROPHYLAXIS as indicated- Heparin 5000q8h DIET-->TF's as tolerated Constipation protocol as indicated   ENDO - ICU hypoglycemic\Hyperglycemia protocol -check FSBS per protocol   ELECTROLYTES -follow labs as needed -replace as needed -pharmacy consultation   DVT/GI PRX ordered -SCDs  TRANSFUSIONS AS NEEDED MONITOR FSBS ASSESS the need for LABS as needed   Critical care provider statement:    Critical care time (minutes):  109   Critical care time was exclusive of:  Separately billable procedures and treating other patients   Critical care was necessary to treat or prevent imminent or life-threatening deterioration of the following conditions:  Severe ARDS, acute hypoxemic respiratory failure, COVID19 infection, AKI, transaminits,multiple comorbid conditions.    Critical care was time spent personally by me on the following activities:  Development of treatment plan with patient or surrogate, discussions with consultants, evaluation of patient's response to treatment, examination of  patient, obtaining history from patient or surrogate, ordering and performing treatments and interventions, ordering and review of laboratory studies and re-evaluation of patient's condition.  I assumed direction of critical care for this patient from another provider in my specialty: no    This document was prepared using Dragon voice recognition software and may include unintentional dictation errors.      Ottie Glazier, M.D.  Division of [place]

## 2018-11-03 NOTE — ED Notes (Signed)
Pt appears improved with bipap. Pulse ox replaced on finger.

## 2018-11-03 NOTE — Consult Note (Signed)
PULMONARY / CRITICAL CARE MEDICINE  Name: Lori Petersen [facility] MRN: [id number] DOB: 1955-09-09    LOS: 1  Referring Provider: Dr. Imogene Burn Reason for Referral: Pneumonia on COVID-19 infection  HPI: This is a 63 year old female with a medical history as indicated below who presented [facility] with complaints of positive COVID-19 test, hypoxemia and altered mental status.  Chest x-ray in the ED showed bilateral pneumonia.  She was placed on a nonrebreather and subsequently transitioned to BiPAP.  She was supposed to be transferred to [facility] however there were no beds hence she is being transferred to the ICU for further management  Past Medical History:  Diagnosis Date  . Arthritis    lower spine  . Asthma   . Diastolic congestive heart failure ([facility])    "only has problems with bad colds"  . Down's syndrome   . Full dentures    does not wear  . GERD (gastroesophageal reflux disease)   . Hypothyroidism   . Onychomycosis    Past Surgical History:  Procedure Laterality Date  . BREAST BIOPSY Left 04/05/2016   neg. cylinder clip  . BREAST BIOPSY Left 12/06/2016   ribbon clip. path pending  . COLONOSCOPY WITH PROPOFOL N/A 04/24/2016   Procedure: COLONOSCOPY WITH PROPOFOL;  Surgeon: Midge Minium, MD;  Location: [facility];  Service: Endoscopy;  Laterality: N/A;  Leave patient at 10:25 due to transportation  . ESOPHAGOGASTRODUODENOSCOPY (EGD) WITH PROPOFOL N/A 04/24/2016   Procedure: ESOPHAGOGASTRODUODENOSCOPY (EGD) WITH PROPOFOL;  Surgeon: Midge Minium, MD;  Location: [facility];  Service: Endoscopy;  Laterality: N/A;  . HERNIA REPAIR     No current facility-administered medications on file prior to encounter.    Current Outpatient Medications on File Prior to Encounter  Medication Sig  . acetaminophen (TYLENOL) 325 MG tablet Take 2 tablets (650 mg total) by mouth every 6 (six) hours as needed for mild pain or moderate pain (or Fever >/= 101).  Marland Kitchen  albuterol (ACCUNEB) 0.63 MG/3ML nebulizer solution Take 1 ampule by nebulization every 6 (six) hours as needed for wheezing.  . budesonide (PULMICORT) 0.25 MG/2ML nebulizer solution Take 0.25 mg by nebulization 2 (two) times daily as needed.   . docusate sodium (COLACE) 100 MG capsule Take 1 capsule (100 mg total) by mouth 2 (two) times daily.  . furosemide (LASIX) 20 MG tablet Take 1 tablet by mouth daily.   Marland Kitchen HYDROcodone-acetaminophen (NORCO/VICODIN) 5-325 MG tablet Take 1 tablet by mouth every 6 (six) hours as needed for severe pain.  . hydrocortisone (ANUSOL-HC) 25 MG suppository Place 1 suppository (25 mg total) rectally 2 (two) times daily.  Marland Kitchen levothyroxine (SYNTHROID, LEVOTHROID) 100 MCG tablet Take 100 mcg by mouth daily.   . memantine (NAMENDA) 10 MG tablet Take 1 tablet by mouth 2 (two) times daily.  . potassium chloride SA (K-DUR,KLOR-CON) 20 MEQ tablet Take 20 mEq by mouth daily.   . pravastatin (PRAVACHOL) 40 MG tablet Take 40 mg by mouth daily.  . Vitamin D, Ergocalciferol, (DRISDOL) 50000 units CAPS capsule Take 50,000 Units by mouth every 7 (seven) days.    Allergies No Known Allergies  Family History No family history on file. Social History  reports that she has never smoked. She has never used smokeless tobacco. She reports previous drug use. She reports that she does not drink alcohol.  Review Of Systems: Unable to obtain as patient is on continuous BiPAP  VITAL SIGNS: BP 109/70 (BP Location: Left Wrist)  Pulse 77   Temp 97.7 F (36.5 C) (Axillary)   Resp (!) 35   Ht 5\' 6"  (1.676 m)   Wt 99.8 kg   SpO2 100%   BMI 35.51 kg/m   HEMODYNAMICS:    VENTILATOR SETTINGS:    INTAKE / OUTPUT: No intake/output data recorded.  PHYSICAL EXAMINATION: Limited and obtained from EMR  General: Acutely ill looking HEENT: PERRLA, trachea midline Neuro: Moves all extremities, unable to follow commands Cardiovascular: Apical pulse regular, S1-S2, no murmur regurg or  gallop Lungs: Increased work of breathing, bilateral breath sounds with diffuse crackles and rhonchi Abdomen: Nondistended, normal bowel sounds in all 4 quadrants Musculoskeletal: No joint deformities Skin: Warm and dry LABS:  BMET Recent Labs  Lab 11/02/18 1605  NA 141  K 4.7  CL 103  CO2 24  BUN 36*  CREATININE 1.71*  GLUCOSE 219*    Electrolytes Recent Labs  Lab 11/02/18 1605  CALCIUM 8.2*    CBC Recent Labs  Lab 11/02/18 1605  WBC 13.9*  HGB 12.8  HCT 42.7  PLT 174    Coag's No results for input(s): APTT, INR in the last 168 hours.  Sepsis Markers Recent Labs  Lab 11/02/18 1911  PROCALCITON 3.19    ABG Recent Labs  Lab 11/02/18 1632  PHART 7.38  PCO2ART 46  PO2ART 60*    Liver Enzymes Recent Labs  Lab 11/02/18 1911  AST 60*  ALT 31  ALKPHOS 45  BILITOT 0.7  ALBUMIN 2.7*    Cardiac Enzymes No results for input(s): TROPONINI, PROBNP in the last 168 hours.  Glucose No results for input(s): GLUCAP in the last 168 hours.  Imaging Dg Chest Portable 1 View  Result Date: 11/02/2018 CLINICAL DATA:  Acute shortness of breath. COVID-19 positive. EXAM: PORTABLE CHEST 1 VIEW COMPARISON:  01/04/2018 FINDINGS: Cardiomegaly and new bilateral airspace opacities are noted. No definite pleural effusion or pneumothorax. No acute bony abnormalities are present. IMPRESSION: Cardiomegaly with new bilateral airspace opacities - question edema and/or infection/pneumonia. Electronically Signed   By: Margarette Canada M.D.   On: 11/02/2018 17:06   CULTURES: None  ANTIBIOTICS: Azithromycin Ceftriaxone  SIGNIFICANT EVENTS: 11/02/2018: Admitted  LINES/TUBES: Peripheral IVs  DISCUSSION: 63 year old female presenting with pneumonia secondary to COVID-19 infection  ASSESSMENT  Acute hypoxic respiratory failure COVID-19 infection Bilateral pneumonia Diastolic heart failure  asthma Dementia  PLAN Hemodynamic monitoring per ICU protocol Continues  BiPAP and titrate to high flow or nonrebreather as tolerated COVID-19 protocol-dexamethasone, remdesivir, Pepcid, bronchodilators Broad-spectrum antibiotics to cover for superimposed bacterial infection Gentle IV hydration [facility]waiting [facility]: Code Status: DNR Diet: N.p.o. while on BiPAP GI prophylaxis: Pepcid VTE prophylaxis: SCDs and subcu heparin  FAMILY  - Updates: Family updated by admitting service.  Will update with any further changes in treatment plan  Magdalene S. Tukov-Yual ANP-BC Pulmonary and [place] Pager [phone] or [phone]  NB: This document was prepared using Dragon voice recognition software and may include unintentional dictation errors.    11/03/2018, 2:51 AM

## 2018-11-03 NOTE — H&P (Signed)
TRH H&P   Patient Demographics:    Lori Petersen, is a 63 y.o. female  MRN: [id number]   DOB - [birth year]  Admit Date - 11/03/2018  Outpatient Primary MD for the patient is [facility] [place]    Patient coming from:  ER  CC - SOB    HPI:    Lori Petersen  is a 63 y.o. female, with history of Down syndrome, hypertension, dyslipidemia, arthritis, GERD, hypothyroidism, chronic diastolic dysfunction no echo report in chart.  Patient lives in nursing home at [place], she was found to be hypoxic and there was a breakout of COVID-19 infection.  She was diagnosed with COVID-19 infection at the nursing home and transferred to [facility] on 11/02/2018 with severe acute hypoxic respiratory failure due to COVID-19 pneumonitis requiring nonrebreather mask.  She had extremely elevated inflammatory markers.  She was transferred to [facility] for further care late in the evening of 11/03/2018.  Patient currently complains of shortness of breath, denies any headache chest or abdominal pain.  No focal weakness.  She has Down syndrome but able to answer my basic questions and follow basic commands.    Review of systems:    A full 10 Petersen Review of Systems was done, except as stated above, all other Review of Systems were negative.   With Past History of the following :    Past Medical History:  Diagnosis Date   Arthritis    lower spine   Asthma    Diastolic congestive heart failure ([place])    "only has problems with bad colds"   Down's syndrome    Full dentures    does not wear   GERD (gastroesophageal reflux disease)    Hypothyroidism    Onychomycosis       Past Surgical History:    Procedure Laterality Date   BREAST BIOPSY Left 04/05/2016   neg. cylinder clip   BREAST BIOPSY Left 12/06/2016   ribbon clip. path pending   COLONOSCOPY WITH PROPOFOL N/A 04/24/2016   Procedure: COLONOSCOPY WITH PROPOFOL;  Surgeon: Lucilla Lame, MD;  Location: [place];  Service: Endoscopy;  Laterality: N/A;  Leave patient at 10:25 due to transportation   ESOPHAGOGASTRODUODENOSCOPY (EGD) WITH PROPOFOL N/A 04/24/2016   Procedure: ESOPHAGOGASTRODUODENOSCOPY (EGD) WITH PROPOFOL;  Surgeon: Lucilla Lame, MD;  Location: [place];  Service: Endoscopy;  Laterality: N/A;   HERNIA REPAIR        Social History:     Social History   Tobacco Use   Smoking status: Never Smoker   Smokeless tobacco: Never Used  Substance Use Topics   Alcohol use: No         Family History :   No history of Down syndrome in the family.   Home Medications:   Prior to Admission medications   Not on File  Home med rec is pending.   Allergies:    No Known Allergies   Physical Exam:   Vitals  Blood pressure 126/71, pulse 96, temperature 98.1 F (36.7 C), temperature source Oral, resp. rate (!) 40, SpO2 90 %.   1. General obese elderly African-American female lying in hospital bed wearing 15 L high flow nasal cannula oxygen still short of breath and tachypneic,  2. Normal affect and insight, Not Suicidal or Homicidal, Awake Alert, Oriented X 3.  3. No F.N deficits, ALL C.Nerves Intact, Strength 5/5 all 4 extremities, Sensation intact all 4 extremities, Plantars down going.  4. Ears and Eyes appear Normal, Conjunctivae clear, PERRLA. Moist Oral Mucosa.  5. Supple Neck, No JVD, No cervical lymphadenopathy appriciated, No Carotid Bruits.  6. Symmetrical Chest wall movement, Good air movement bilaterally, bilateral rails,  7. RRR, No Gallops, Rubs or Murmurs, No Parasternal Heave.  8. Positive Bowel Sounds, Abdomen Soft, No tenderness, No organomegaly appriciated,No  rebound -guarding or rigidity.  Foley in place.  9.  No Cyanosis, Normal Skin Turgor, No Skin Rash or Bruise.  10. Good muscle tone,  joints appear normal , no effusions, Normal ROM.  11. No Palpable Lymph Nodes in Neck or Axillae      Data Review:    CBC Recent Labs  Lab 11/02/18 1605 11/03/18 0559  WBC 13.9* 9.3  HGB 12.8 11.4*  HCT 42.7 36.4  PLT 174 156  MCV 103.9* 101.7*  MCH 31.1 31.8  MCHC 30.0 31.3  RDW 15.6* 15.2  LYMPHSABS 0.7 0.6*  MONOABS 0.4 0.3  EOSABS 0.0 0.0  BASOSABS 0.0 0.1   ------------------------------------------------------------------------------------------------------------------  Chemistries  Recent Labs  Lab 11/02/18 1605 11/02/18 1911 11/03/18 0559  NA 141  --  146*  K 4.7  --  4.4  CL 103  --  108  CO2 24  --  27  GLUCOSE 219*  --  138*  BUN 36*  --  40*  CREATININE 1.71*  --  1.31*  CALCIUM 8.2*  --  7.5*  MG  --   --  2.5*  AST  --  60* 55*  ALT  --  31 31  ALKPHOS  --  45 36*  BILITOT  --  0.7 0.6   ------------------------------------------------------------------------------------------------------------------ estimated creatinine clearance is 53.1 mL/min (A) (by C-G formula based on SCr of 1.31 mg/dL (H)). ------------------------------------------------------------------------------------------------------------------ No results for input(s): TSH, T4TOTAL, T3FREE, THYROIDAB in the last 72 hours.  Invalid input(s): FREET3  Coagulation profile No results for input(s): INR, PROTIME in the last 168 hours. ------------------------------------------------------------------------------------------------------------------- No results for input(s): DDIMER in the last 72 hours. -------------------------------------------------------------------------------------------------------------------  Cardiac Enzymes No results for input(s): CKMB, TROPONINI, MYOGLOBIN in the last 168 hours.  Invalid input(s):  CK ------------------------------------------------------------------------------------------------------------------    Component Value Date/Time   BNP 230.0 (H) 11/02/2018 1605     ---------------------------------------------------------------------------------------------------------------  Urinalysis    Component Value Date/Time   COLORURINE YELLOW (A) 04/27/2017 2248   APPEARANCEUR HAZY (A) 04/27/2017 2248  APPEARANCEUR CLEAR 02/22/2013 2300   LABSPEC 1.015 04/27/2017 2248   LABSPEC 1.013 02/22/2013 2300   PHURINE 7.0 04/27/2017 2248   GLUCOSEU NEGATIVE 04/27/2017 2248   GLUCOSEU NEGATIVE 02/22/2013 2300   HGBUR NEGATIVE 04/27/2017 2248   BILIRUBINUR NEGATIVE 04/27/2017 2248   BILIRUBINUR NEGATIVE 02/22/2013 2300   KETONESUR 20 (A) 04/27/2017 2248   PROTEINUR 30 (A) 04/27/2017 2248   NITRITE NEGATIVE 04/27/2017 2248   LEUKOCYTESUR NEGATIVE 04/27/2017 2248   LEUKOCYTESUR NEGATIVE 02/22/2013 2300    ----------------------------------------------------------------------------------------------------------------   Imaging Results:    Dg Chest Portable 1 View  Result Date: 11/02/2018 CLINICAL DATA:  Acute shortness of breath. COVID-19 positive. EXAM: PORTABLE CHEST 1 VIEW COMPARISON:  01/04/2018 FINDINGS: Cardiomegaly and new bilateral airspace opacities are noted. No definite pleural effusion or pneumothorax. No acute bony abnormalities are present. IMPRESSION: Cardiomegaly with new bilateral airspace opacities - question edema and/or infection/pneumonia. Electronically Signed   By: Harmon PierJeffrey  Hu M.D.   On: 11/02/2018 17:06    My personal review of EKG: Rhythm sinus tachycardia 110 bpm for nonspecific ST changes   Assessment & Plan:      1.  Severe acute hypoxic respiratory failure due to COVID-19 pneumonitis.  She has spent 1 [facility] at [facility], respiratory failure is extremely advanced and she is currently on 15 L flow nasal cannula and still breathing in upper  30s.  She is a DNR.  I do not think she will survive beyond this night.  I will start her on IV steroids and Remdisvir immediately, I also discussed Actemra use this with patient and she is agreeable to it.  Denies any history of TB or hepatitis.  Also none found in chart review.  I also tried calling patient's sister twice but there was no response on the listed phone number.  At this time I do not have time to wait as I think patient might as well die tonight.  Benefit of trying to use Actemra far outweighs remote risk of any infection.  Will be monitored closely.  Supportive care will be continued.  We will also give a trial of Lasix as she has few crackles.  2.  Acute on chronic diastolic CHF.  EF unknown no previous echo report on file.  Trial of Lasix and monitor.  3.  Down syndrome.  Supportive care.  4.  AKI with hypernatremia.  Might need hydration later on but for now gentle Lasix due to crackles and severe respiratory failure.  Reevaluate with electrolytes in the morning.  For now her urine looks quite dilute.  She has a Foley.  5.  Mild transaminitis.  Due to COVID-19 infection.  Trend and monitor.  Asymptomatic.   Other medical problems of hypothyroidism and dyslipidemia.  Will be addressed once home [facility] reconciliation is done.    DVT Prophylaxis  Lovenox   AM Labs Ordered, also please review Full Orders  Family Communication: Admission, patients condition and plan of care including tests being ordered have been discussed with the patient   who indicates understanding and agree with the plan and Code Status.  Code Status DNR  Likely DC to  SNF  Condition GUARDED  +++  Consults called: None    Admission status: Inpt    Time spent in minutes : 35   Susa RaringPrashant Ishaq Maffei M.D on 11/03/2018 at 5:43 PM  To page go to [email or web address] - password [email or web address]

## 2018-11-03 NOTE — Progress Notes (Signed)
Patient was transported to [facility]  Patient is able to say yes/no and nod her head.  She cannot speak.    Her sister is her legal guardian [phone] Delbert Phenix).  Her sister reported that wearing her dentures makes her gag, so she does not wear them.  She has no teeth, so she is not able to chew well.  Patient loves to color and loves cartoons.  Her primary doctor is Marchia Bond [phone] Her doctor said we can call any time if needed.  Phillis Knack, RN

## 2018-11-03 NOTE — Progress Notes (Signed)
Patient's sister  Jilda Roche [facility]) updated on patients condition. Mrs [name] is currently in a nursing home and does not have her listed cell number with her .The number to her room at the facility is [phone].

## 2018-11-03 NOTE — Progress Notes (Signed)
[place] [facility] [place] NAME: Lori Petersen    MR#:  [id number]  DATE OF BIRTH:  [birth year]  SUBJECTIVE:   patient on BIPAP  REVIEW OF SYSTEMS:    Unable to obtain.  Patient has Down syndrome and is on BiPAP  Tolerating Diet:npo      DRUG ALLERGIES:  No Known Allergies  VITALS:  Blood pressure 107/83, pulse 89, temperature 97.7 F (36.5 C), temperature source Axillary, resp. rate (!) 32, height 5\' 6"  (1.676 m), weight 99.8 kg, SpO2 97 %.  PHYSICAL EXAMINATION:  Constitutional: Appears  well-nourished. No distress. HENT: Normocephalic. Marland Kitchen Oropharynx is clear and moist.  Eyes: Conjunctivae and EOM are normal. PERRLA, no scleral icterus.  Neck: Normal ROM. Neck supple. No JVD. No tracheal deviation. CVS: RRR, S1/S2 +, no murmurs, no gallops, no carotid bruit.  Pulmonary: b/l rhonchii Abdominal: Soft. BS +,  no distension, tenderness, rebound or guarding.  Musculoskeletal: Normal range of motion. No edema and no tenderness.  Neuro: lethargic on BIPAP Skin: Skin is warm and dry. No rash noted. Psychiatric: downs syndrome      LABORATORY PANEL:   CBC Recent Labs  Lab 11/03/18 0559  WBC 9.3  HGB 11.4*  HCT 36.4  PLT 156   ------------------------------------------------------------------------------------------------------------------  Chemistries  Recent Labs  Lab 11/03/18 0559  NA 146*  K 4.4  CL 108  CO2 27  GLUCOSE 138*  BUN 40*  CREATININE 1.31*  CALCIUM 7.5*  MG 2.5*  AST 55*  ALT 31  ALKPHOS 36*  BILITOT 0.6   ------------------------------------------------------------------------------------------------------------------  Cardiac Enzymes No results for input(s): TROPONINI in the last 168 hours. ------------------------------------------------------------------------------------------------------------------  RADIOLOGY:  Dg Chest Portable 1 View  Result Date: 11/02/2018 CLINICAL DATA:  Acute  shortness of breath. COVID-19 positive. EXAM: PORTABLE CHEST 1 VIEW COMPARISON:  01/04/2018 FINDINGS: Cardiomegaly and new bilateral airspace opacities are noted. No definite pleural effusion or pneumothorax. No acute bony abnormalities are present. IMPRESSION: Cardiomegaly with new bilateral airspace opacities - question edema and/or infection/pneumonia. Electronically Signed   By: Margarette Canada M.D.   On: 11/02/2018 17:06     ASSESSMENT AND PLAN:   63 year old female with chronic diastolic heart failure, Down syndrome and hypothyroid [facility] distress with a positive Covid test a few days ago.   Acute hypoxic respiratory failure due to bilateral pneumonia from COVID-19: Wean BiPAP as tolerated Management as per intensivist  2.  COVID-19 pneumonia: Continue remdesivir return  3.  Acute kidney injury due to poor p.o. intake improved with IV fluids 4.  Dementia: Continue Namenda  5.  Hypothyroidism: Continue Synthroid    CODE STATUS: dnr  TOTAL TIME TAKING CARE OF THIS PATIENT: 28 minutes.     POSSIBLE D/C 4 days, DEPENDING ON CLINICAL CONDITION.   Bettey Costa M.D on 11/03/2018 at 9:47 AM  Between 7am to 6pm - Pager - [phone] After 6pm go to [email or web address] - password [email or web address] [facility]  Office  [phone]  CC: Primary care physician; [facility] [place]  Note: This dictation was prepared with Diplomatic Services operational officer dictation along with smaller phrase technology. Any transcriptional errors that result from this process are unintentional.

## 2018-11-03 NOTE — Progress Notes (Signed)
Reports received from [facility]. Patient admitted from a nursing home this evening . Patient is on 15LHFNC with oxygen saturation of 90-92, respiration of 30-50. Md aware. Will continue to monitor. Primary Md from  [facility] senior care called and was updated.

## 2018-11-03 NOTE — Discharge Summary (Signed)
CRITICAL CARE PROGRESS NOTE    Name: Rivergrove MRN: [id number] DOB: [birth year]     LOS: 1   SUBJECTIVE FINDINGS & SIGNIFICANT EVENTS   Patient description:  63 yo female from NH (peak resource) brought in to ED in respiratory distress, found to have +COVID19 result.  Initially admitted by hospitalist service for altered mental status and hypoxemia. She was placed on nonrebreather,  Her CXR (pictorial documentation below) with bilateral airspace opacification consistent with PNA. CRP is >22,  ddimer >6300, ferritin >1400, PCT 4.32, HStrop->470, ABG with pAO2 60 on 100FIO2 suggestive of severe ARDS, no TTE to support CHF.      Lines / Drains: PIV   Cultures / Sepsis markers: Nasal MRSA and COVID  Blood cultures  Antibiotics: Zithromax & Rocephin    Protocols / Consultants: Plan for transport to [facility]  Tests / Events: TTE   Overnight: Patient is in shock -bp 70/50 - s/p NS + LR bolus Currently on HHFNC 50/50  PAST MEDICAL HISTORY   Past Medical History:  Diagnosis Date  . Arthritis    lower spine  . Asthma   . Diastolic congestive heart failure ([place])    "only has problems with bad colds"  . Down's syndrome   . Full dentures    does not wear  . GERD (gastroesophageal reflux disease)   . Hypothyroidism   . Onychomycosis      SURGICAL HISTORY   Past Surgical History:  Procedure Laterality Date  . BREAST BIOPSY Left 04/05/2016   neg. cylinder clip  . BREAST BIOPSY Left 12/06/2016   ribbon clip. path pending  . COLONOSCOPY WITH PROPOFOL N/A 04/24/2016   Procedure: COLONOSCOPY WITH PROPOFOL;  Surgeon: Lucilla Lame, MD;  Location: [facility];  Service: Endoscopy;  Laterality: N/A;  Leave patient at 10:25 due to transportation  . ESOPHAGOGASTRODUODENOSCOPY (EGD) WITH PROPOFOL N/A  04/24/2016   Procedure: ESOPHAGOGASTRODUODENOSCOPY (EGD) WITH PROPOFOL;  Surgeon: Lucilla Lame, MD;  Location: [facility];  Service: Endoscopy;  Laterality: N/A;  . HERNIA REPAIR       FAMILY HISTORY   No family history on file.   SOCIAL HISTORY   Social History   Tobacco Use  . Smoking status: Never Smoker  . Smokeless tobacco: Never Used  Substance Use Topics  . Alcohol use: No  . Drug use: Not Currently     MEDICATIONS   Current Medication:  Current Facility-Administered Medications:  .  0.9 %  sodium chloride infusion, , Intravenous, Continuous, Demetrios Loll, MD, Last Rate: 50 mL/hr at 11/03/18 0250, 800 mL at 11/03/18 0250 .  albuterol (VENTOLIN HFA) 108 (90 Base) MCG/ACT inhaler 2 puff, 2 puff, Inhalation, Q6H, Demetrios Loll, MD, 2 puff at 11/03/18 1007 .  azithromycin (ZITHROMAX) 500 mg in sodium chloride 0.9 % 250 mL IVPB, 500 mg, Intravenous, Q24H, Hulan Szumski, MD .  bisacodyl (DULCOLAX) EC tablet 5 mg, 5 mg, Oral, Daily PRN, Demetrios Loll, MD .  cefTRIAXone (ROCEPHIN) 1 g in sodium chloride 0.9 % 100 mL IVPB, 1 g, Intravenous, Q24H, Diron Haddon, MD .  chlorhexidine (PERIDEX) 0.12 % solution 15 mL, 15 mL, Mouth Rinse, BID, Tukov-Yual, Magdalene S, NP, 15 mL at 11/03/18 1004 .  Chlorhexidine Gluconate Cloth 2 % PADS 6 each, 6 each, Topical, Daily, Demetrios Loll, MD .  chlorpheniramine-HYDROcodone (TUSSIONEX) 10-8 MG/5ML suspension 5 mL, 5 mL, Oral, Q12H PRN, Demetrios Loll, MD .  dexamethasone (DECADRON) injection 6 mg, 6 mg, Intravenous, Daily, Nance Pear, MD .  docusate sodium (COLACE) capsule 100 mg, 100 mg, Oral, BID, Demetrios Loll, MD .  famotidine (PEPCID) IVPB 20 mg premix, 20 mg, Intravenous, Q24H, Tukov-Yual, Magdalene S, NP .  fluticasone (FLOVENT HFA) 110 MCG/ACT inhaler 1 puff, 1 puff, Inhalation, BID, Hall, Scott A, RPH, 1 puff at 11/03/18 1009 .  guaiFENesin-dextromethorphan (ROBITUSSIN DM) 100-10 MG/5ML syrup 10 mL, 10 mL, Oral, Q4H PRN, Demetrios Loll,  MD .  heparin injection 5,000 Units, 5,000 Units, Subcutaneous, Q8H, Demetrios Loll, MD, 5,000 Units at 11/03/18 [phone] .  HYDROcodone-acetaminophen (NORCO/VICODIN) 5-325 MG per tablet 1-2 tablet, 1-2 tablet, Oral, Q4H PRN, Demetrios Loll, MD .  levothyroxine (SYNTHROID) tablet 100 mcg, 100 mcg, Oral, Daily, Demetrios Loll, MD .  MEDLINE mouth rinse, 15 mL, Mouth Rinse, q12n4p, Tukov-Yual, Magdalene S, NP .  memantine (NAMENDA) tablet 10 mg, 10 mg, Oral, BID, Demetrios Loll, MD .  ondansetron [facility]) tablet 4 mg, 4 mg, Oral, Q6H PRN **OR** ondansetron (ZOFRAN) injection 4 mg, 4 mg, Intravenous, Q6H PRN, Demetrios Loll, MD .  Margrett Rud remdesivir 200 mg in sodium chloride 0.9 % 250 mL IVPB, 200 mg, Intravenous, Once, Stopped at 11/02/18 2258 **FOLLOWED BY** remdesivir 100 mg in sodium chloride 0.9 % 250 mL IVPB, 100 mg, Intravenous, Q24H, Shanlever, Pierce Crane, RPH .  senna-docusate (Senokot-S) tablet 1 tablet, 1 tablet, Oral, QHS PRN, Demetrios Loll, MD    ALLERGIES   Patient has no known allergies.    REVIEW OF SYSTEMS     Unable to obtain due to Downs/ARDS  PHYSICAL EXAMINATION   Vital Signs: Temp:  [97.3 F (36.3 C)-97.7 F (36.5 C)] 97.7 F (36.5 C) (10/18 0230) Pulse Rate:  [75-109] 89 (10/18 0940) Resp:  [27-51] 32 (10/18 0940) BP: (91-124)/(66-96) 107/83 (10/18 0940) SpO2:  [84 %-100 %] 97 % (10/18 0940) FiO2 (%):  [100 %] 100 % (10/18 0820) Weight:  [99.8 kg] 99.8 kg (10/17 1602)  GENERAL:critically ill HEAD: Normocephalic, atraumatic.  EYES: Pupils equal, round, MOUTH: Moist mucosal membrane. NECK: Supple. No thyromegaly. No nodules. No JVD.  PULMONARY:tachypneic CARDIOVASCULAR: telemetry reviewed with bradycardia and PVcs GASTROINTESTINAL: Obese   MUSCULOSKELETAL: moving extermities x 4  NEUROLOGIC: Mild distress due to acute illness SKIN:intact  COVID-19 DISASTER DECLARATION:   FULL CONTACT PHYSICAL EXAMINATION WAS NOT POSSIBLE DUE TO TREATMENT OF COVID-19  AND CONSERVATION OF  PERSONAL PROTECTIVE EQUIPMENT, LIMITED EXAM FINDINGS INCLUDE-    Patient assessed or the symptoms described in the history of present illness.  In the context of the Global COVID-19 pandemic, which necessitated consideration that the patient might be at risk for infection with the SARS-CoV-2 virus that causes COVID-19, Institutional protocols and algorithms that pertain to the evaluation of patients at risk for COVID-19 are in a state of rapid change based on information released by regulatory bodies including the CDC and federal and state organizations. These policies and algorithms were followed during the patient's care while in hospital.    PERTINENT DATA     Infusions: . sodium chloride 800 mL (11/03/18 0250)  . azithromycin    . cefTRIAXone (ROCEPHIN)  IV    . famotidine (PEPCID) IV    . remdesivir 100 mg in NS 250 mL     Scheduled Medications: . albuterol  2 puff Inhalation Q6H  . chlorhexidine  15 mL Mouth Rinse BID  . Chlorhexidine Gluconate Cloth  6 each Topical Daily  . dexamethasone (DECADRON) injection  6 mg Intravenous Daily  . docusate sodium  100 mg Oral BID  . fluticasone  1 puff Inhalation BID  . heparin  5,000 Units Subcutaneous Q8H  . levothyroxine  100 mcg Oral Daily  . mouth rinse  15 mL Mouth Rinse q12n4p  . memantine  10 mg Oral BID   PRN Medications: bisacodyl, chlorpheniramine-HYDROcodone, guaiFENesin-dextromethorphan, HYDROcodone-acetaminophen, ondansetron **OR** ondansetron (ZOFRAN) IV, senna-docusate Hemodynamic parameters:   Intake/Output: 10/17 0701 - 10/18 0700 In: 1250 [I.V.:1000; IV Piggyback:250] Out: -   Ventilator  Settings: FiO2 (%):  [100 %] 100 %   LAB RESULTS:  Basic Metabolic Panel: Recent Labs  Lab 11/02/18 1605 11/03/18 0559  NA 141 146*  K 4.7 4.4  CL 103 108  CO2 24 27  GLUCOSE 219* 138*  BUN 36* 40*  CREATININE 1.71* 1.31*  CALCIUM 8.2* 7.5*  MG  --  2.5*  PHOS  --  3.4   Liver Function Tests: Recent Labs   Lab 11/02/18 1911 11/03/18 0559  AST 60* 55*  ALT 31 31  ALKPHOS 45 36*  BILITOT 0.7 0.6  PROT 7.3 6.7  ALBUMIN 2.7* 2.6*   No results for input(s): LIPASE, AMYLASE in the last 168 hours. No results for input(s): AMMONIA in the last 168 hours. CBC: Recent Labs  Lab 11/02/18 1605 11/03/18 0559  WBC 13.9* 9.3  NEUTROABS 12.4* 7.7  HGB 12.8 11.4*  HCT 42.7 36.4  MCV 103.9* 101.7*  PLT 174 156   Cardiac Enzymes: No results for input(s): CKTOTAL, CKMB, CKMBINDEX, TROPONINI in the last 168 hours. BNP: Invalid input(s): POCBNP CBG: No results for input(s): GLUCAP in the last 168 hours.   IMAGING RESULTS:  Imaging: Dg Chest Portable 1 View  Result Date: 11/02/2018 CLINICAL DATA:  Acute shortness of breath. COVID-19 positive. EXAM: PORTABLE CHEST 1 VIEW COMPARISON:  01/04/2018 FINDINGS: Cardiomegaly and new bilateral airspace opacities are noted. No definite pleural effusion or pneumothorax. No acute bony abnormalities are present. IMPRESSION: Cardiomegaly with new bilateral airspace opacities - question edema and/or infection/pneumonia. Electronically Signed   By: Margarette [place] M.D.   On: 11/02/2018 17:06      ASSESSMENT AND PLAN    -Multidisciplinary rounds held today  Acute Hypoxic Respiratory Failure Due to severe ARDS secondary to COVID 19 - currently on HHFNC -need central line with CVP trend -s/p LR bolus -will diurese when out of circulatory shock -continue Full MV support -continue Bronchodilator Therapy -Wean Fio2 and PEEP as tolerated -will perform SAT/SBT when respiratory parameters are met -empiric Rocephin /zithromax- Procal >4   Circulatory shock  - likely distributive due to CAP from [place] - consider [place] with IVC respirophasic changes to eval for fluid responsiveness - s/p LR bolus with increment in MAP -net +1250 - on Dexamethasone  -low threshold for vasopressor support -TTE ordered and pending -urine output unclear due to adult diaper on on  arrival  -ABG repeat in am  ICU monitoring   COVID19 infection   - Remdesevir s/p 200X1 and 100X4d  - Consider Actemra - CRP >22  - Dexamethasone  - VITc/zinc  -O2 - wean to spO2>88%  -MDI albuterol   - close monitoring high risk for clinical decline    Acute Renal Failure-KDIGO 2 -Oliguric  -likely due to transient hypotension while in shock - avoid hypotension - d/c nonessential nephrotoxins-asaprin -stat bladder scan for retention- only 30ccurien -follow chem 7 -follow UO -continue Foley Catheter-assess need daily- critically ill state -document urine output   Moderate protein calorie malnutrition   - albumin 2.6    - will likely worsen due to acutely ill  state   - dietary evaluation    Transaminits  - monitor for worsening - d/c statin and tylenol for now  - possible onset of shock liver - avoid hypotension  -repeat CMP in 6h   Hypothyroidism   - continue home synthroid   - telemetry monitoring    ID -continue IV abx as prescibed -follow up cultures   GI/Nutrition GI PROPHYLAXIS as indicated- Heparin 5000q8h DIET-->TF's as tolerated Constipation protocol as indicated   ENDO - ICU hypoglycemic\Hyperglycemia protocol -check FSBS per protocol   ELECTROLYTES -follow labs as needed -replace as needed -pharmacy consultation   DVT/GI PRX ordered -SCDs  TRANSFUSIONS AS NEEDED MONITOR FSBS ASSESS the need for LABS as needed   Critical care provider statement:    Critical care time (minutes):  109   Critical care time was exclusive of:  Separately billable procedures and treating other patients   Critical care was necessary to treat or prevent imminent or life-threatening deterioration of the following conditions:  Severe ARDS, acute hypoxemic respiratory failure, COVID19 infection, AKI, transaminits,multiple comorbid conditions.    Critical care was time spent personally by me on the following activities:  Development of treatment plan with  patient or surrogate, discussions with consultants, evaluation of patient's response to treatment, examination of patient, obtaining history from patient or surrogate, ordering and performing treatments and interventions, ordering and review of laboratory studies and re-evaluation of patient's condition.  I assumed direction of critical care for this patient from another provider in my specialty: no    This document was prepared using Dragon voice recognition software and may include unintentional dictation errors.      Ottie Glazier, M.D.  Division of [place]

## 2018-11-04 LAB — CBC WITH DIFFERENTIAL/PLATELET
Abs Immature Granulocytes: 0.7 10*3/uL — ABNORMAL HIGH (ref 0.00–0.07)
Basophils Absolute: 0 10*3/uL (ref 0.0–0.1)
Basophils Relative: 0 %
Eosinophils Absolute: 0 10*3/uL (ref 0.0–0.5)
Eosinophils Relative: 0 %
HCT: 38.3 % (ref 36.0–46.0)
Hemoglobin: 11.7 g/dL — ABNORMAL LOW (ref 12.0–15.0)
Lymphocytes Relative: 8 %
Lymphs Abs: 0.6 10*3/uL — ABNORMAL LOW (ref 0.7–4.0)
MCH: 31.9 pg (ref 26.0–34.0)
MCHC: 30.5 g/dL (ref 30.0–36.0)
MCV: 104.4 fL — ABNORMAL HIGH (ref 80.0–100.0)
Monocytes Absolute: 0.4 10*3/uL (ref 0.1–1.0)
Monocytes Relative: 5 %
Myelocytes: 9 %
Neutro Abs: 5.9 10*3/uL (ref 1.7–7.7)
Neutrophils Relative %: 78 %
Platelets: 193 10*3/uL (ref 150–400)
RBC: 3.67 MIL/uL — ABNORMAL LOW (ref 3.87–5.11)
RDW: 15.6 % — ABNORMAL HIGH (ref 11.5–15.5)
WBC: 7.5 10*3/uL (ref 4.0–10.5)
nRBC: 1.2 % — ABNORMAL HIGH (ref 0.0–0.2)

## 2018-11-04 LAB — SODIUM, URINE, RANDOM: Sodium, Ur: 37 mmol/L

## 2018-11-04 LAB — D-DIMER, QUANTITATIVE: D-Dimer, Quant: 2.34 ug/mL-FEU — ABNORMAL HIGH (ref 0.00–0.50)

## 2018-11-04 LAB — TYPE AND SCREEN
ABO/RH(D): A POS
Antibody Screen: NEGATIVE

## 2018-11-04 LAB — OSMOLALITY, URINE: Osmolality, Ur: 806 mOsm/kg (ref 300–900)

## 2018-11-04 LAB — COMPREHENSIVE METABOLIC PANEL
ALT: 28 U/L (ref 0–44)
AST: 50 U/L — ABNORMAL HIGH (ref 15–41)
Albumin: 2.5 g/dL — ABNORMAL LOW (ref 3.5–5.0)
Alkaline Phosphatase: 37 U/L — ABNORMAL LOW (ref 38–126)
Anion gap: 11 (ref 5–15)
BUN: 40 mg/dL — ABNORMAL HIGH (ref 8–23)
CO2: 29 mmol/L (ref 22–32)
Calcium: 7.8 mg/dL — ABNORMAL LOW (ref 8.9–10.3)
Chloride: 108 mmol/L (ref 98–111)
Creatinine, Ser: 1.2 mg/dL — ABNORMAL HIGH (ref 0.44–1.00)
GFR calc Af Amer: 56 mL/min — ABNORMAL LOW (ref >60)
GFR calc non Af Amer: 48 mL/min — ABNORMAL LOW (ref >60)
Glucose, Bld: 151 mg/dL — ABNORMAL HIGH (ref 70–99)
Potassium: 4.9 mmol/L (ref 3.5–5.1)
Sodium: 148 mmol/L — ABNORMAL HIGH (ref 135–145)
Total Bilirubin: 0.5 mg/dL (ref 0.3–1.2)
Total Protein: 6.5 g/dL (ref 6.5–8.1)

## 2018-11-04 LAB — BRAIN NATRIURETIC PEPTIDE: B Natriuretic Peptide: 91.4 pg/mL (ref 0.0–100.0)

## 2018-11-04 LAB — OSMOLALITY: Osmolality: 328 mOsm/kg (ref 275–295)

## 2018-11-04 LAB — MAGNESIUM: Magnesium: 2.4 mg/dL (ref 1.7–2.4)

## 2018-11-04 LAB — ABO/RH: ABO/RH(D): A POS

## 2018-11-04 LAB — URIC ACID: Uric Acid, Serum: 8.7 mg/dL — ABNORMAL HIGH (ref 2.5–7.1)

## 2018-11-04 LAB — CREATININE, URINE, RANDOM: Creatinine, Urine: 110.17 mg/dL

## 2018-11-04 LAB — C-REACTIVE PROTEIN: CRP: 15.9 mg/dL — ABNORMAL HIGH (ref <1.0)

## 2018-11-04 MED ORDER — HALOPERIDOL LACTATE 5 MG/ML IJ SOLN: 2.0000 mg | Freq: Four times a day (QID) | INTRAMUSCULAR | Status: DC | PRN

## 2018-11-04 MED ORDER — ENOXAPARIN SODIUM 60 MG/0.6ML ~~LOC~~ SOLN
50.0000 mg | SUBCUTANEOUS | Status: DC
  Administered 2018-11-04 – 2018-11-05 (×2): 50 mg via SUBCUTANEOUS
  Filled 2018-11-04 (×2): qty 0.6

## 2018-11-04 MED ORDER — DEXTROSE 5 % IV SOLN
INTRAVENOUS | Status: DC
  Administered 2018-11-04 (×2): via INTRAVENOUS

## 2018-11-04 NOTE — Evaluation (Signed)
Occupational Therapy Evaluation Patient Details Name: [facility] MRN: [id number] DOB: Jul 18, 1955 Today's Date: 11/04/2018    History of Present Illness Lori Petersen  is a 63 y.o. female, with history of Down syndrome, hypertension, dyslipidemia, arthritis, GERD, hypothyroidism, chronic diastolic dysfunction no echo report in chart.  Patient lives in nursing home at [place], she was found to be hypoxic and there was a breakout of COVID-19 infection.  She was diagnosed with COVID-19 infection at the nursing home and transferred to [facility] on 11/02/2018 with severe acute hypoxic respiratory failure due to COVID-19 pneumonitis requiring nonrebreather mask.   Clinical Impression   Pt admitted with above diagnoses, presenting with compromised cardiopulmonary status limiting ability to engage in BADL at desired level of independence. Unsure of actual baseline, but pt is from facility- suspect baseline total care. At time of eval, pt is resistive to all mobility. Attempted to play music for relaxation and increased facilitation in tasks. Pt ultimately total A +2 to roll and total A for peri after having BM. Pt on 4L HFNC at start of session, RN came into room and turned pt up to 10 L, then ultimately back down to 5L at end of session. Recommend pt return to facility for continued SNF level of care. Will continue to follow acutely per POC listed below.     Follow Up Recommendations  SNF;Other (comment)(return to facility if they are able to provide total care)    Equipment Recommendations  None recommended by OT    Recommendations for Other Services       Precautions / Restrictions Precautions Precautions: Fall Precaution Comments: monitor SPO2, incontinent, check first Restrictions Weight Bearing Restrictions: No      Mobility Bed Mobility Overal bed mobility: Needs Assistance Bed Mobility: Rolling Rolling: +2 for safety/equipment;+2 for physical assistance;Total assist         General bed mobility comments: patient very resisitive to rolling for pericare and bed pad change  Transfers                 General transfer comment: nt    Balance                                           ADL either performed or assessed with clinical judgement   ADL Overall ADL's : Needs assistance/impaired                                     Functional mobility during ADLs: Total assistance;+2 for physical assistance;+2 for safety/equipment(for rolling) General ADL Comments: pt is from facility, suspect baseline for BADLs is total A     Vision   Vision Assessment?: No apparent visual deficits     Perception     Praxis      Pertinent Vitals/Pain Pain Assessment: Faces Faces Pain Scale: Hurts little more Pain Location: difficult to assess, yealls out, very resisitive to mobility Pain Descriptors / Indicators: Moaning;Grimacing Pain Intervention(s): Monitored during session;Repositioned     Hand Dominance     Extremity/Trunk Assessment Upper Extremity Assessment Upper Extremity Assessment: Generalized weakness   Lower Extremity Assessment Lower Extremity Assessment: Defer to PT evaluation RLE Deficits / Details: pt resisted any attempts to flex the hip and knee to assist with rolling. LLE Deficits / Details: same as right  Cervical / Trunk Assessment Cervical / Trunk Assessment: (unable to assess as unable tit up)   Communication Communication Communication: Expressive difficulties   Cognition Arousal/Alertness: Awake/alert Behavior During Therapy: Anxious;Restless Overall Cognitive Status: Impaired/Different from baseline Area of Impairment: Orientation;Problem solving;Attention;Following commands;Awareness                 Orientation Level: Place;Time;Situation Current Attention Level: Focused   Following Commands: Follows one step commands inconsistently   Awareness: Intellectual Problem Solving:  Slow processing;Decreased initiation General Comments: pt did not follow any verbal commands, mostly resisitive to attempts. did make some verbal answers  when asked questions. Down syndrome at baseline   General Comments       Exercises     Shoulder Instructions      Home Living Family/patient expects to be discharged to:: Skilled nursing facility                                 Additional Comments: per chart review, is from facility, unsure of details      Prior Functioning/Environment          Comments: unsure, from SNF        OT Problem List: Decreased strength;Decreased knowledge of use of DME or AE;Decreased cognition;Decreased safety awareness;Impaired balance (sitting and/or standing);Pain;Decreased activity tolerance;Cardiopulmonary status limiting activity      OT Treatment/Interventions: Self-care/ADL training;Therapeutic exercise;Patient/family education;Balance training;Energy conservation;Therapeutic activities;DME and/or AE instruction;Cognitive remediation/compensation    OT Goals(Current goals can be found in the care plan section) Acute Rehab OT Goals OT Goal Formulation: Patient unable to participate in goal setting Time For Goal Achievement: 11/18/18 Potential to Achieve Goals: Good  OT Frequency: Min 2X/week   Barriers to D/C:            Co-evaluation PT/OT/SLP Co-Evaluation/Treatment: Yes Reason for Co-Treatment: Complexity of the patient's impairments (multi-system involvement);Necessary to address cognition/behavior during functional activity;For patient/therapist safety;To address functional/ADL transfers PT goals addressed during session: Mobility/safety with mobility OT goals addressed during session: ADL's and self-care      AM-PAC OT "6 Clicks" Daily Activity     Outcome Measure Help from another person eating meals?: A Little Help from another person taking care of personal grooming?: A Little Help from another person  toileting, which includes using toliet, bedpan, or urinal?: Total Help from another person bathing (including washing, rinsing, drying)?: Total Help from another person to put on and taking off regular upper body clothing?: Total Help from another person to put on and taking off regular lower body clothing?: Total 6 Click Score: 10   End of Session Equipment Utilized During Treatment: Oxygen Nurse Communication: Mobility status  Activity Tolerance: Patient tolerated treatment well Patient left: in bed;with call bell/phone within reach;with bed alarm set;with nursing/sitter in room  OT Visit Diagnosis: Other abnormalities of gait and mobility (R26.89);Pain;Muscle weakness (generalized) (M62.81);Other symptoms and signs involving cognitive function Pain - part of body: (generalized)                Time: 7510-2585 OT Time Calculation (min): 24 min Charges:  OT General Charges $OT Visit: 1 Visit OT Evaluation $OT Eval Moderate Complexity: 1 Mod  Dalphine Handing, MSOT, OTR/L Behavioral Health OT/ Acute Relief OT [facility] Office: [phone] [facility]: [phone]  Dalphine Handing 11/04/2018, 2:24 PM

## 2018-11-04 NOTE — Evaluation (Signed)
Physical Therapy Evaluation Patient Details Name: Lori Petersen [facility] MRN: [id number] DOB: 1955/06/04 Today's Date: 11/04/2018   History of Present Illness  Lori Petersen  is a 63 y.o. female, with history of Down syndrome, hypertension, dyslipidemia, arthritis, GERD, hypothyroidism, chronic diastolic dysfunction no echo report in chart.  Patient lives in nursing home at [facility], she was found to be hypoxic and there was a breakout of COVID-19 infection.  She was diagnosed with COVID-19 infection at the nursing home and transferred to [facility] on 11/02/2018 with severe acute hypoxic respiratory failure due to COVID-19 pneumonitis requiring nonrebreather mask.  Clinical Impression  PT evaluation very limited as patient was unable to follow for bed mobility, very resisitve to rolling, requiring total assist. Unable to safely attempt sitting on bed edge. Did place in slight bed/chair position in bed. Patient tends to slide toward foot of bed. Unsure of patient's PLOF. Continue PT attempts for mobility. Pt admitted with above diagnosis. Pt currently with functional limitations due to the deficits listed below (see PT Problem List). Pt will benefit from skilled PT to increase their independence and safety with mobility to allow discharge to the venue listed below.   Patient  Received on 4-5 L HFNC with SPO2 80-90's, variable on finger. Placed on ear lobe and increased O2 to 10 L with SPO2 95%. RR 35.RN aware.    Follow Up Recommendations SNF(return to previous facilioty)    Equipment Recommendations  None recommended by PT    Recommendations for Other Services       Precautions / Restrictions Precautions Precautions: Fall Precaution Comments: monitor SPO2, incontinent, check first      Mobility  Bed Mobility Overal bed mobility: Needs Assistance Bed Mobility: Rolling Rolling: +2 for safety/equipment;+2 for physical assistance;Total assist         General bed mobility comments:  patient very resisitive to rolling for pericare and bed pad change  Transfers                 General transfer comment: nt  Ambulation/Gait                Stairs            Wheelchair Mobility    Modified Rankin (Stroke Patients Only)       Balance                                             Pertinent Vitals/Pain Pain Assessment: Faces Faces Pain Scale: Hurts little more Pain Location: difficult to assess, yealls out, very resisitive to mobility Pain Intervention(s): Monitored during session    Home Living Family/patient expects to be discharged to:: Skilled nursing facility                      Prior Function           Comments: unsure, from SNF     Hand Dominance        Extremity/Trunk Assessment   Upper Extremity Assessment Upper Extremity Assessment: Defer to OT evaluation    Lower Extremity Assessment Lower Extremity Assessment: RLE deficits/detail;LLE deficits/detail RLE Deficits / Details: pt resisted any attempts to flex the hip and knee to assist with rolling. LLE Deficits / Details: same as right    Cervical / Trunk Assessment Cervical / Trunk Assessment: (unable to assess as unable tit up)  Communication   Communication: Expressive difficulties(answers uh huh a lot)  Cognition Arousal/Alertness: Awake/alert Behavior During Therapy: Anxious;Restless Overall Cognitive Status: Impaired/Different from baseline Area of Impairment: Orientation;Problem solving;Attention;Following commands;Awareness                 Orientation Level: Place;Time;Situation Current Attention Level: Divided   Following Commands: Follows one step commands inconsistently   Awareness: Intellectual Problem Solving: Slow processing;Decreased initiation General Comments: pt did not follow any verbal commands, mostly resisitive to attempts. did make some verbal answers  when asked questions.      General Comments       Exercises     Assessment/Plan    PT Assessment Patient needs continued PT services  PT Problem List Decreased strength;Decreased mobility;Decreased safety awareness;Decreased knowledge of precautions;Decreased activity tolerance;Decreased cognition;Cardiopulmonary status limiting activity;Decreased balance;Decreased knowledge of use of DME       PT Treatment Interventions DME instruction;Therapeutic activities;Cognitive remediation;Gait training;Therapeutic exercise;Patient/family education;Functional mobility training;Balance training    PT Goals (Current goals can be found in the Care Plan section)  Acute Rehab PT Goals PT Goal Formulation: Patient unable to participate in goal setting Time For Goal Achievement: 11/18/18 Potential to Achieve Goals: Fair    Frequency Min 2X/week   Barriers to discharge        Co-evaluation PT/OT/SLP Co-Evaluation/Treatment: Yes Reason for Co-Treatment: Complexity of the patient's impairments (multi-system involvement);For patient/therapist safety;To address functional/ADL transfers;Necessary to address cognition/behavior during functional activity PT goals addressed during session: Mobility/safety with mobility OT goals addressed during session: ADL's and self-care       AM-PAC PT "6 Clicks" Mobility  Outcome Measure Help needed turning from your back to your side while in a flat bed without using bedrails?: Total Help needed moving from lying on your back to sitting on the side of a flat bed without using bedrails?: Total Help needed moving to and from a bed to a chair (including a wheelchair)?: Total Help needed standing up from a chair using your arms (e.g., wheelchair or bedside chair)?: Total Help needed to walk in hospital room?: Total Help needed climbing 3-5 steps with a railing? : Total 6 Click Score: 6    End of Session Equipment Utilized During Treatment: Oxygen Activity Tolerance: Treatment limited secondary to  agitation;Treatment limited secondary to medical complications (Comment) Patient left: in bed;with call bell/phone within reach;with bed alarm set;with nursing/sitter in room Nurse Communication: Mobility status PT Visit Diagnosis: Difficulty in walking, not elsewhere classified (R26.2)    Time: 6979-4801 PT Time Calculation (min) (ACUTE ONLY): 22 min   Charges:   PT Evaluation $PT Eval Low Complexity: [place] Pager [phone] Office [phone]   Claretha Cooper 11/04/2018, 2:04 PM

## 2018-11-04 NOTE — Progress Notes (Addendum)
PROGRESS NOTE                                                                                                                                                                                                             Patient Demographics:    Lori Petersen, is a 63 y.o. female, DOB - 12/12/1955, QTM:[id number]  Outpatient Primary MD for the patient is [facility]    LOS - 1  Admit date - 11/03/2018    No chief complaint on file.      Brief narrative. Lori Petersen  is a 63 y.o. female, with history of Down syndrome, hypertension, dyslipidemia, arthritis, GERD, hypothyroidism, chronic diastolic dysfunction no echo report in chart.  Patient lives in nursing home at [facility], she was found to be hypoxic and there was a breakout of COVID-19 infection.  She was diagnosed with COVID-19 infection at the nursing home and transferred to [facility] on 11/02/2018 with severe acute hypoxic respiratory failure due to COVID-19 pneumonitis requiring nonrebreather mask.  She had extremely elevated inflammatory markers.  She was transferred to [facility] for further care late in the evening of 11/03/2018.     Subjective:    [facility] today has, No headache, No chest pain, No abdominal pain - No Nausea, No new weakness tingling or numbness, NO Cough - SOB.     Assessment  & Plan :     1.  Severe acute hypoxic respiratory failure due to COVID-19 pneumonitis.  She has spent 1 day at [facility], respiratory failure is extremely advanced and she was on 15 L flow nasal cannula and still breathing in upper 30s at the time of admission.    Was promptly started on IV steroids remdesivir and given a dose of Actemra, she has shown remarkable improvement in both her oxygen requirements and respiratory effort.  Kindly see H&P for consent.  Patient is still tenuous but much improved from the day of admission.  Currently on 5 L  nasal cannula oxygen with a pulse ox of 94% at 12:50 PM on 11/04/2018.  ABG     Component Value Date/Time   PHART 7.38 11/02/2018 1632   PCO2ART 46 11/02/2018 1632   PO2ART 60 (L) 11/02/2018 1632   HCO3 27.2 11/02/2018 1632   O2SAT 90.1 11/02/2018 1632    COVID-19 Labs  Recent Labs    11/02/18 1605 11/02/18 1645 11/03/18 0559 11/03/18 0600 11/04/18 0240  DDIMER  --   --   --   --  2.34*  FERRITIN 1,216*  --  1,453*  --   --   CRP  --  22.2*  --  22.2* 15.9*    Lab Results  Component Value Date   SARSCOV2NAA POSITIVE (A) 11/02/2018     SpO2: 93 % O2 Flow Rate (L/min): 5 L/min  Hepatic Function Latest Ref Rng & Units 11/04/2018 11/03/2018 11/02/2018  Total Protein 6.5 - 8.1 g/dL 6.5 6.7 7.3  Albumin 3.5 - 5.0 g/dL 2.5(L) 2.6(L) 2.7(L)  AST 15 - 41 U/L 50(H) 55(H) 60(H)  ALT 0 - 44 U/L 28 31 31   Alk Phosphatase 38 - 126 U/L 37(L) 36(L) 45  Total Bilirubin 0.3 - 1.2 mg/dL 0.5 0.6 0.7  Bilirubin, Direct 0.0 - 0.2 mg/dL - - 0.3(H)        Component Value Date/Time   BNP 91.4 11/04/2018 0240      2.  Acute on chronic diastolic CHF.    Has been diuresed.  Now compensated..  3.  Down syndrome.  Supportive care.  4.  AKI with hypernatremia. Gentle D5W and monitor.  She has a Foley.  Will check urine electrolytes to.  5.  Mild transaminitis.  Due to COVID-19 infection.  Trend and monitor.  Asymptomatic.     Condition - Extremely Guarded  Family Communication  :  Called sister x 2 no response  Code Status :  DNR  Diet :   Diet Order            Diet NPO time specified Except for: Sips with Meds  Diet effective now               Disposition Plan  :  SNF  Consults  :     Procedures  :     PUD Prophylaxis :    DVT Prophylaxis  :  Lovenox    Lab Results  Component Value Date   PLT 193 11/04/2018    Inpatient Medications  Scheduled Meds: . aspirin  81 mg Oral Daily  . Chlorhexidine Gluconate Cloth  6 each Topical Daily  . enoxaparin  (LOVENOX) injection  40 mg Subcutaneous Q24H  . methylPREDNISolone (SOLU-MEDROL) injection  60 mg Intravenous Q12H  . sodium chloride flush  3 mL Intravenous Q12H   Continuous Infusions: . dextrose    . remdesivir 100 mg in NS 250 mL     PRN Meds:.acetaminophen, albuterol, bisacodyl, ondansetron **OR** ondansetron (ZOFRAN) IV  Antibiotics  :    Anti-infectives (From admission, onward)   Start     Dose/Rate Route Frequency Ordered Stop   11/04/18 1600  remdesivir 100 mg in sodium chloride 0.9 % 250 mL IVPB     100 mg 500 mL/hr over 30 Minutes Intravenous Every 24 hours 11/03/18 1755 11/07/18 1559   11/03/18 1830  remdesivir 100 mg in sodium chloride 0.9 % 250 mL IVPB     100 mg 500 mL/hr over 30 Minutes Intravenous  Once 11/03/18 1757 11/03/18 1917   11/03/18 1800  remdesivir 100 mg in sodium chloride 0.9 % 250 mL IVPB  Status:  Discontinued     100 mg 500 mL/hr over 30 Minutes Intravenous Every 24 hours 11/03/18 1755 11/03/18 1757       Time Spent in minutes  30   Lala Lund M.D on 11/04/2018 at 1:34 PM  To page go to [email or web address] - password [facility]  [facility] -  Office  [phone]    See all Orders from today for further details    Objective:   Vitals:   11/04/18 1015 11/04/18 1020 11/04/18 1100 11/04/18 1200  BP:    102/81  Pulse: 81 88 84   Resp: (!) 36 (!) 39 (!) 21 (!) 28  Temp:    98.3 F (36.8 C)  TempSrc:    Oral  SpO2: (!) 85% 92% 97% 93%  Weight:      Height:        Wt Readings from Last 3 Encounters:  11/04/18 99.8 kg  11/02/18 99.8 kg  01/04/18 87.5 kg     Intake/Output Summary (Last 24 hours) at 11/04/2018 1334 Last data filed at 11/03/2018 2100 Gross per 24 hour  Intake -  Output 2000 ml  Net -2000 ml     Physical Exam  Awake   No new F.N deficits,   [place].AT,PERRAL Supple Neck,No JVD, No cervical lymphadenopathy appriciated.  Symmetrical Chest wall movement, Good air movement bilaterally, CTAB RRR,No Gallops,Rubs or  new Murmurs, No Parasternal Heave +ve B.Sounds, Abd Soft, No tenderness, No organomegaly appriciated, No rebound - guarding or rigidity. No Cyanosis, Clubbing or edema, No new Rash or bruise       Data Review:    CBC Recent Labs  Lab 11/02/18 1605 11/03/18 0559 11/04/18 0240  WBC 13.9* 9.3 7.5  HGB 12.8 11.4* 11.7*  HCT 42.7 36.4 38.3  PLT 174 156 193  MCV 103.9* 101.7* 104.4*  MCH 31.1 31.8 31.9  MCHC 30.0 31.3 30.5  RDW 15.6* 15.2 15.6*  LYMPHSABS 0.7 0.6* 0.6*  MONOABS 0.4 0.3 0.4  EOSABS 0.0 0.0 0.0  BASOSABS 0.0 0.1 0.0    Chemistries  Recent Labs  Lab 11/02/18 1605 11/02/18 1911 11/03/18 0559 11/04/18 0240  NA 141  --  146* 148*  K 4.7  --  4.4 4.9  CL 103  --  108 108  CO2 24  --  27 29  GLUCOSE 219*  --  138* 151*  BUN 36*  --  40* 40*  CREATININE 1.71*  --  1.31* 1.20*  CALCIUM 8.2*  --  7.5* 7.8*  MG  --   --  2.5* 2.4  AST  --  60* 55* 50*  ALT  --  31 31 28   ALKPHOS  --  45 36* 37*  BILITOT  --  0.7 0.6 0.5   ------------------------------------------------------------------------------------------------------------------ No results for input(s): CHOL, HDL, LDLCALC, TRIG, CHOLHDL, LDLDIRECT in the last 72 hours.  No results found for: HGBA1C ------------------------------------------------------------------------------------------------------------------ No results for input(s): TSH, T4TOTAL, T3FREE, THYROIDAB in the last 72 hours.  Invalid input(s): FREET3  Cardiac Enzymes No results for input(s): CKMB, TROPONINI, MYOGLOBIN in the last 168 hours.  Invalid input(s): CK ------------------------------------------------------------------------------------------------------------------    Component Value Date/Time   BNP 91.4 11/04/2018 0240    Micro Results Recent Results (from the past 240 hour(s))  SARS Coronavirus 2 by RT PCR (hospital order, performed in [facility] hospital lab) Nasopharyngeal Nasopharyngeal Swab     Status: Abnormal    Collection Time: 11/02/18  4:45 PM   Specimen: Nasopharyngeal Swab  Result Value Ref Range Status   SARS Coronavirus 2 POSITIVE (A) NEGATIVE Final    Comment: RESULT CALLED TO, READ BACK BY AND VERIFIED WITH: SMITH,A AT 1813 ON 11/02/2018 BY JPM (NOTE) If result is NEGATIVE SARS-CoV-2 target nucleic acids are NOT DETECTED. The SARS-CoV-2  RNA is generally detectable in upper and lower  respiratory specimens during the acute phase of infection. The lowest  concentration of SARS-CoV-2 viral copies this assay can detect is 250  copies / mL. A negative result does not preclude SARS-CoV-2 infection  and should not be used as the sole basis for treatment or other  patient management decisions.  A negative result may occur with  improper specimen collection / handling, submission of specimen other  than nasopharyngeal swab, presence of viral mutation(s) within the  areas targeted by this assay, and inadequate number of viral copies  (<250 copies / mL). A negative result must be combined with clinical  observations, patient history, and epidemiological information. If result is POSITIVE SARS-CoV-2 target nucleic acids are DETECTED.  The SARS-CoV-2 RNA is generally detectable in upper and lower  respiratory specimens during the acute phase of infection.  Positive  results are indicative of active infection with SARS-CoV-2.  Clinical  correlation with patient history and other diagnostic information is  necessary to determine patient infection status.  Positive results do  not rule out bacterial infection or co-infection with other viruses. If result is PRESUMPTIVE POSTIVE SARS-CoV-2 nucleic acids MAY BE PRESENT.   A presumptive positive result was obtained on the submitted specimen  and confirmed on repeat testing.  While 2019 novel coronavirus  (SARS-CoV-2) nucleic acids may be present in the submitted sample  additional confirmatory testing may be necessary for epidemiological  and / or  clinical management purposes  to differentiate between  SARS-CoV-2 and other Sarbecovirus currently known to infect humans.  If clinically indicated additional testing with an alternate test  methodology [phone]) i s advised. The SARS-CoV-2 RNA is generally  detectable in upper and lower respiratory specimens during the acute  phase of infection. The expected result is Negative. Fact Sheet for Patients:  StrictlyIdeas.no Fact Sheet for Healthcare Providers: BankingDealers.co.za This test is not yet approved or cleared by the [place] FDA and has been authorized for detection and/or diagnosis of SARS-CoV-2 by FDA under an Emergency Use Authorization (EUA).  This EUA will remain in effect (meaning this test can be used) for the duration of the COVID-19 declaration under Section 564(b)(1) of the Act, 21 U.S.C. section 360bbb-3(b)(1), unless the authorization is terminated or revoked sooner. Performed at [facility], [address] [place], [place] [address]   MRSA PCR Screening     Status: None   Collection Time: 11/03/18  6:24 AM   Specimen: Nasal Mucosa; Nasopharyngeal  Result Value Ref Range Status   MRSA by PCR NEGATIVE NEGATIVE Final    Comment:        The GeneXpert MRSA Assay (FDA approved for NASAL specimens only), is one component of a comprehensive MRSA colonization surveillance program. It is not intended to diagnose MRSA infection nor to guide or monitor treatment for MRSA infections. Performed at [facility], [address] [place], [place] [address]   CULTURE, BLOOD (ROUTINE X 2) w Reflex to ID Panel     Status: None (Preliminary result)   Collection Time: 11/03/18 12:44 PM   Specimen: BLOOD  Result Value Ref Range Status   Specimen Description BLOOD L HAND  Final   Special Requests   Final    BOTTLES DRAWN AEROBIC AND ANAEROBIC Blood Culture adequate volume   Culture   Final    NO  GROWTH < 24 HOURS Performed at [facility], [address] [place], [place] [address]    Report  Status PENDING  Incomplete    Radiology Reports Dg Chest Portable 1 View  Result Date: 11/02/2018 CLINICAL DATA:  Acute shortness of breath. COVID-19 positive. EXAM: PORTABLE CHEST 1 VIEW COMPARISON:  01/04/2018 FINDINGS: Cardiomegaly and new bilateral airspace opacities are noted. No definite pleural effusion or pneumothorax. No acute bony abnormalities are present. IMPRESSION: Cardiomegaly with new bilateral airspace opacities - question edema and/or infection/pneumonia. Electronically Signed   By: Margarette Canada M.D.   On: 11/02/2018 17:06

## 2018-11-04 NOTE — Evaluation (Signed)
Clinical/Bedside Swallow Evaluation Patient Details  Name: Lori [facility] MRN: [id number] Date of Birth: February 27, 1955  Today's Date: 11/04/2018 Time: SLP Start Time (ACUTE ONLY): 2778 SLP Stop Time (ACUTE ONLY): 1020 SLP Time Calculation (min) (ACUTE ONLY): 28 min  Past Medical History:  Past Medical History:  Diagnosis Date  . Arthritis    lower spine  . Asthma   . Diastolic congestive heart failure ([place])    "only has problems with bad colds"  . Down's syndrome   . Full dentures    does not wear  . GERD (gastroesophageal reflux disease)   . Hypothyroidism   . Onychomycosis    Past Surgical History:  Past Surgical History:  Procedure Laterality Date  . BREAST BIOPSY Left 04/05/2016   neg. cylinder clip  . BREAST BIOPSY Left 12/06/2016   ribbon clip. path pending  . COLONOSCOPY WITH PROPOFOL N/A 04/24/2016   Procedure: COLONOSCOPY WITH PROPOFOL;  Surgeon: Lucilla Lame, MD;  Location: [place];  Service: Endoscopy;  Laterality: N/A;  Leave patient at 10:25 due to transportation  . ESOPHAGOGASTRODUODENOSCOPY (EGD) WITH PROPOFOL N/A 04/24/2016   Procedure: ESOPHAGOGASTRODUODENOSCOPY (EGD) WITH PROPOFOL;  Surgeon: Lucilla Lame, MD;  Location: [facility];  Service: Endoscopy;  Laterality: N/A;  . HERNIA REPAIR     HPI:  63 y.o. female, with history of Down syndrome, hypertension, dyslipidemia, arthritis, GERD, hypothyroidism, chronic diastolic dysfunction no echo report in chart.  Patient lives in nursing home at [place], she was found to be hypoxic and there was a breakout of COVID-19 infection.  She was diagnosed with COVID-19 infection at the nursing home and transferred to [facility] on 11/02/2018 with severe acute hypoxic respiratory failure due to COVID-19 pneumonitis requiring nonrebreather mask.  She had extremely elevated inflammatory markers.  She was transferred to [facility] for further care late in the evening of 11/03/2018.   Assessment / Plan /  Recommendation Clinical Impression  Orders received for bedside swallow evaluation.  Pt alert, answering basic questions about personal comfort.  RR ranging from mid20s-mid40s; Sp02 high 70s upon entering room on 3l [place].  RN present to assess pt.  With 02 increased, pt's sats improved to mid-80s, RR continued to fluctuate.  Pt demonstrated interest in ice chips, teaspoon water, which were provided in upright position - she presented with delayed but consistent coughing after limited trials of POs.  RN present; saturations and RR continued to fluctuate.  Respiratory function too tenuous to safely begin POs at this time; pt's RR does not appear to allow adequate apneic period for airway protection with POs.  Currently high aspiration risk.  Continue NPO.  SLP will follow for improvements, readiness to resume a diet. D/W RN.  SLP Visit Diagnosis: Dysphagia, unspecified (R13.10)    Aspiration Risk  Severe aspiration risk    Diet Recommendation   NPO       Other  Recommendations Oral Care Recommendations: Oral care QID   Follow up Recommendations Skilled Nursing facility      Frequency and Duration min 2x/week  2 weeks       Prognosis        Swallow Study   General HPI: 63 y.o. female, with history of Down syndrome, hypertension, dyslipidemia, arthritis, GERD, hypothyroidism, chronic diastolic dysfunction no echo report in chart.  Patient lives in nursing home at [place], she was found to be hypoxic and there was a breakout of COVID-19 infection.  She was diagnosed with COVID-19 infection at the nursing home and transferred  to [facility] on 11/02/2018 with severe acute hypoxic respiratory failure due to COVID-19 pneumonitis requiring nonrebreather mask.  She had extremely elevated inflammatory markers.  She was transferred to [facility] for further care late in the evening of 11/03/2018. Type of Study: Bedside Swallow Evaluation Previous Swallow Assessment: no Diet Prior to this Study:  NPO Temperature Spikes Noted: No Respiratory Status: Nasal cannula(HFNC) History of Recent Intubation: No Behavior/Cognition: Alert Oral Cavity Assessment: Within Functional Limits Oral Cavity - Dentition: Edentulous Patient Positioning: Upright in bed Baseline Vocal Quality: Normal Volitional Cough: Cognitively unable to elicit Volitional Swallow: Able to elicit    Oral/Motor/Sensory Function Overall Oral Motor/Sensory Function: Within functional limits   Ice Chips Ice chips: Impaired Presentation: Spoon Pharyngeal Phase Impairments: Cough - Delayed   Thin Liquid Thin Liquid: Impaired Presentation: Spoon Pharyngeal  Phase Impairments: Cough - Delayed    Nectar Thick Nectar Thick Liquid: Not tested   Honey Thick Honey Thick Liquid: Not tested   Puree Puree: Impaired Presentation: Spoon Pharyngeal Phase Impairments: Throat Clearing - Delayed   Solid     Solid: Not tested      Lori Petersen Lori Petersen 11/04/2018,10:32 AM  Lori Petersen L. Lori Frederic, MA CCC/SLP Acute Rehabilitation Services Office number [phone] Pager [phone]

## 2018-11-04 NOTE — Progress Notes (Signed)
PT Cancellation Note  Patient Details Name: Lori Petersen MRN: [id number] DOB: 01-18-1955   Cancelled Treatment:    Reason Eval/Treat Not Completed: Medical issues which prohibited therapy, RN reports that poatient is in respiratory distress, check back another time.    Claretha Cooper 11/04/2018, [place] Office [phone]

## 2018-11-05 ENCOUNTER — Other Ambulatory Visit: Payer: Self-pay

## 2018-11-05 ENCOUNTER — Inpatient Hospital Stay: Payer: Self-pay

## 2018-11-05 ENCOUNTER — Encounter (HOSPITAL_COMMUNITY): Payer: Self-pay

## 2018-11-05 LAB — CBC WITH DIFFERENTIAL/PLATELET
Abs Immature Granulocytes: 0.8 10*3/uL — ABNORMAL HIGH (ref 0.00–0.07)
Band Neutrophils: 10 %
Basophils Absolute: 0 10*3/uL (ref 0.0–0.1)
Basophils Relative: 0 %
Eosinophils Absolute: 0 10*3/uL (ref 0.0–0.5)
Eosinophils Relative: 0 %
HCT: 42.7 % (ref 36.0–46.0)
Hemoglobin: 12.8 g/dL (ref 12.0–15.0)
Lymphocytes Relative: 6 %
Lymphs Abs: 0.4 10*3/uL — ABNORMAL LOW (ref 0.7–4.0)
MCH: 31.4 pg (ref 26.0–34.0)
MCHC: 30 g/dL (ref 30.0–36.0)
MCV: 104.7 fL — ABNORMAL HIGH (ref 80.0–100.0)
Metamyelocytes Relative: 2 %
Monocytes Absolute: 0.3 10*3/uL (ref 0.1–1.0)
Monocytes Relative: 5 %
Myelocytes: 12 %
Neutro Abs: 4.4 10*3/uL (ref 1.7–7.7)
Neutrophils Relative %: 65 %
Platelets: 199 10*3/uL (ref 150–400)
RBC: 4.08 MIL/uL (ref 3.87–5.11)
RDW: 15.7 % — ABNORMAL HIGH (ref 11.5–15.5)
WBC: 5.9 10*3/uL (ref 4.0–10.5)
nRBC: 2.6 % — ABNORMAL HIGH (ref 0.0–0.2)

## 2018-11-05 LAB — MAGNESIUM: Magnesium: 2.6 mg/dL — ABNORMAL HIGH (ref 1.7–2.4)

## 2018-11-05 LAB — COMPREHENSIVE METABOLIC PANEL
ALT: 25 U/L (ref 0–44)
AST: 43 U/L — ABNORMAL HIGH (ref 15–41)
Albumin: 2.3 g/dL — ABNORMAL LOW (ref 3.5–5.0)
Alkaline Phosphatase: 37 U/L — ABNORMAL LOW (ref 38–126)
Anion gap: 12 (ref 5–15)
BUN: 40 mg/dL — ABNORMAL HIGH (ref 8–23)
CO2: 29 mmol/L (ref 22–32)
Calcium: 7.8 mg/dL — ABNORMAL LOW (ref 8.9–10.3)
Chloride: 112 mmol/L — ABNORMAL HIGH (ref 98–111)
Creatinine, Ser: 1.2 mg/dL — ABNORMAL HIGH (ref 0.44–1.00)
GFR calc Af Amer: 56 mL/min — ABNORMAL LOW (ref >60)
GFR calc non Af Amer: 48 mL/min — ABNORMAL LOW (ref >60)
Glucose, Bld: 177 mg/dL — ABNORMAL HIGH (ref 70–99)
Potassium: 4.9 mmol/L (ref 3.5–5.1)
Sodium: 153 mmol/L — ABNORMAL HIGH (ref 135–145)
Total Bilirubin: 0.5 mg/dL (ref 0.3–1.2)
Total Protein: 6.4 g/dL — ABNORMAL LOW (ref 6.5–8.1)

## 2018-11-05 LAB — BRAIN NATRIURETIC PEPTIDE: B Natriuretic Peptide: 73.8 pg/mL (ref 0.0–100.0)

## 2018-11-05 LAB — D-DIMER, QUANTITATIVE: D-Dimer, Quant: 3.35 ug/mL-FEU — ABNORMAL HIGH (ref 0.00–0.50)

## 2018-11-05 LAB — C-REACTIVE PROTEIN: CRP: 9 mg/dL — ABNORMAL HIGH (ref <1.0)

## 2018-11-05 MED ORDER — HALOPERIDOL LACTATE 5 MG/ML IJ SOLN: 5.0000 mg | Freq: Three times a day (TID) | INTRAMUSCULAR | Status: DC | PRN

## 2018-11-05 MED ORDER — DEXTROSE 5 % IV SOLN
INTRAVENOUS | Status: DC
  Administered 2018-11-05 (×2): via INTRAVENOUS

## 2018-11-05 MED ORDER — PANTOPRAZOLE SODIUM 40 MG PO TBEC
40.0000 mg | DELAYED_RELEASE_TABLET | Freq: Every day | ORAL | Status: DC
  Administered 2018-11-05 – 2018-11-07 (×3): 40 mg via ORAL
  Filled 2018-11-05 (×3): qty 1

## 2018-11-05 MED ORDER — LEVOTHYROXINE SODIUM 88 MCG PO TABS
88.0000 ug | ORAL_TABLET | Freq: Every day | ORAL | Status: DC
  Administered 2018-11-06 – 2018-11-07 (×2): 88 ug via ORAL
  Filled 2018-11-05 (×3): qty 1

## 2018-11-05 MED ORDER — SENNA 8.6 MG PO TABS: 2.0000 | ORAL_TABLET | Freq: Every day | ORAL | Status: DC

## 2018-11-05 MED ORDER — HALOPERIDOL LACTATE 5 MG/ML IJ SOLN
5.0000 mg | Freq: Three times a day (TID) | INTRAMUSCULAR | Status: DC | PRN
  Administered 2018-11-05: 5 mg via INTRAMUSCULAR
  Filled 2018-11-05: qty 1

## 2018-11-05 MED ORDER — MEMANTINE HCL 10 MG PO TABS
10.0000 mg | ORAL_TABLET | Freq: Two times a day (BID) | ORAL | Status: DC
  Administered 2018-11-05 – 2018-11-07 (×3): 10 mg via ORAL
  Filled 2018-11-05 (×7): qty 1

## 2018-11-05 MED ORDER — METHYLPREDNISOLONE SODIUM SUCC 40 MG IJ SOLR
40.0000 mg | Freq: Two times a day (BID) | INTRAMUSCULAR | Status: DC
  Administered 2018-11-05 – 2018-11-06 (×2): 40 mg via INTRAVENOUS
  Filled 2018-11-05 (×2): qty 1

## 2018-11-05 MED ORDER — VITAMIN D 25 MCG (1000 UNIT) PO TABS
2000.0000 [IU] | ORAL_TABLET | Freq: Every day | ORAL | Status: DC
  Administered 2018-11-05 – 2018-11-07 (×3): 2000 [IU] via ORAL
  Filled 2018-11-05 (×3): qty 2

## 2018-11-05 MED ORDER — SODIUM CHLORIDE 0.9% FLUSH: 10.0000 mL | INTRAVENOUS | Status: DC | PRN

## 2018-11-05 MED ORDER — SODIUM CHLORIDE 0.9% FLUSH
10.0000 mL | Freq: Two times a day (BID) | INTRAVENOUS | Status: DC
  Administered 2018-11-06 – 2018-11-07 (×2): 10 mL

## 2018-11-05 MED ORDER — LORAZEPAM 2 MG/ML IJ SOLN: 0.5000 mg | Freq: Once | INTRAMUSCULAR | Status: DC

## 2018-11-05 MED ORDER — LATANOPROST 0.005 % OP SOLN
1.0000 [drp] | Freq: Every day | OPHTHALMIC | Status: DC
  Filled 2018-11-05: qty 2.5

## 2018-11-05 MED ORDER — PRAVASTATIN SODIUM 40 MG PO TABS
40.0000 mg | ORAL_TABLET | Freq: Every day | ORAL | Status: DC
  Administered 2018-11-05 – 2018-11-07 (×3): 40 mg via ORAL
  Filled 2018-11-05 (×3): qty 1

## 2018-11-05 NOTE — NC FL2 (Signed)
[place] MEDICAID FL2 LEVEL OF CARE SCREENING TOOL     IDENTIFICATION  Patient Name: Lori Petersen: 17-Feb-1955 Sex: female Admission Date (Current Location): 11/03/2018  [facility] and [place] Number:  Herbalist and Address:  [facility], [place] [address] [place], [place] [address]      Provider Number: [id number]  Attending Physician Name and Address:  Thurnell Lose, MD  Relative Name and Phone Number:       Current Level of Care: Hospital Recommended Level of Care: Nursing Facility Prior Approval Number:    Date Approved/Denied:   PASRR Number: [id number]  Discharge Plan:      Current Diagnoses: Patient Active Problem List   Diagnosis Date Noted  . COVID-19 virus infection 11/03/2018  . COVID-19   . Acute respiratory failure with hypoxia ([place]) 11/02/2018  . Left tibial fracture 04/27/2017  . Right tibial fracture 04/27/2017  . Problems with swallowing and mastication   . Blood in stool   . Bradycardia 05/14/2015  . Mixed Alzheimer's and vascular dementia ([place]) 09/14/2014  . Onychomycosis     Orientation RESPIRATION BLADDER Height & Weight     Self  O2(currently 15LHFNC) Indwelling catheter Weight: 220 lb (99.8 kg) Height:  5\' 6"  (167.6 cm)  BEHAVIORAL SYMPTOMS/MOOD NEUROLOGICAL BOWEL NUTRITION STATUS      Incontinent Diet(see dc summary)  AMBULATORY STATUS COMMUNICATION OF NEEDS Skin   Extensive Assist Verbally Normal                       Personal Care Assistance Level of Assistance  Bathing, Dressing Bathing Assistance: Limited assistance   Dressing Assistance: Limited assistance     Functional Limitations Info  Sight, Hearing Sight Info: Impaired Hearing Info: Impaired      SPECIAL CARE FACTORS FREQUENCY                       Contractures Contractures Info: Not present    Additional Factors Info  Code Status Code Status Info: DNR             Current Medications  (11/05/2018):  This is the current hospital active medication list Current Facility-Administered Medications  Medication Dose Route Frequency Provider Last Rate Last Dose  . acetaminophen (TYLENOL) tablet 650 mg  650 mg Oral Q6H PRN Thurnell Lose, MD      . albuterol (VENTOLIN HFA) 108 (90 Base) MCG/ACT inhaler 2 puff  2 puff Inhalation Q6H PRN Thurnell Lose, MD   2 puff at 11/04/18 0737  . aspirin chewable tablet 81 mg  81 mg Oral Daily Thurnell Lose, MD   81 mg at 11/05/18 1100  . bisacodyl (DULCOLAX) EC tablet 5 mg  5 mg Oral Daily PRN Thurnell Lose, MD      . Chlorhexidine Gluconate Cloth 2 % PADS 6 each  6 each Topical Daily Thurnell Lose, MD   6 each at 11/05/18 1104  . cholecalciferol (VITAMIN D3) tablet 2,000 Units  2,000 Units Oral Daily Thurnell Lose, MD   2,000 Units at 11/05/18 1100  . dextrose 5 % solution   Intravenous Continuous Thurnell Lose, MD 100 mL/hr at 11/05/18 1219    . enoxaparin (LOVENOX) injection 50 mg  50 mg Subcutaneous Q24H Shade, Christine E, RPH   50 mg at 11/04/18 1731  . haloperidol lactate (HALDOL) injection 5 mg  5 mg Intramuscular Q8H PRN Thurnell Lose,  MD   5 mg at 11/05/18 1019  . latanoprost (XALATAN) 0.005 % ophthalmic solution 1 drop  1 drop Left Eye QHS Leroy Sea, MD      . Melene Muller ON 11/06/2018] levothyroxine (SYNTHROID) tablet 88 mcg  88 mcg Oral QAC breakfast Leroy Sea, MD      . memantine [facility]) tablet 10 mg  10 mg Oral BID Leroy Sea, MD   10 mg at 11/05/18 1100  . methylPREDNISolone sodium succinate (SOLU-MEDROL) 40 mg/mL injection 40 mg  40 mg Intravenous Q12H Susa Raring K, MD      . ondansetron (ZOFRAN) injection 4 mg  4 mg Intravenous Q6H PRN Leroy Sea, MD      . pantoprazole (PROTONIX) EC tablet 40 mg  40 mg Oral Daily Leroy Sea, MD   40 mg at 11/05/18 1100  . pravastatin (PRAVACHOL) tablet 40 mg  40 mg Oral Daily Leroy Sea, MD   40 mg at 11/05/18 1100  .  remdesivir 100 mg in sodium chloride 0.9 % 250 mL IVPB  100 mg Intravenous Q24H Leroy Sea, MD 500 mL/hr at 11/04/18 1634 100 mg at 11/04/18 1634  . senna (SENOKOT) tablet 17.2 mg  2 tablet Oral QHS Susa Raring K, MD      . sodium chloride flush (NS) 0.9 % injection 10-40 mL  10-40 mL Intracatheter Q12H Susa Raring K, MD      . sodium chloride flush (NS) 0.9 % injection 10-40 mL  10-40 mL Intracatheter PRN Leroy Sea, MD      . sodium chloride flush (NS) 0.9 % injection 3 mL  3 mL Intravenous Q12H Leroy Sea, MD   3 mL at 11/04/18 2035     Discharge Medications: Please see discharge summary for a list of discharge medications.  Relevant Imaging Results:  Relevant Lab Results:   Additional Information [id number]  Dellie Burns [place], [place]

## 2018-11-05 NOTE — Progress Notes (Signed)
Pt continually attempting to get out of bed despite multiple attempts of redirection. Tele-sitter discontinued and 1:1 observation with sitter at bedside ordered to maintain patient's safety.

## 2018-11-05 NOTE — Progress Notes (Signed)
Attempted calling sister Jilda Roche for PICC consent  but no answer,will  try again later.

## 2018-11-05 NOTE — Plan of Care (Signed)
  Problem: Education: Goal: Knowledge of risk factors and measures for prevention of condition will improve 11/05/2018 1054 by Herma Ard, RN Outcome: Progressing 11/05/2018 1054 by Herma Ard, RN Outcome: Not Progressing   Problem: Coping: Goal: Psychosocial and spiritual needs will be supported 11/05/2018 1054 by Herma Ard, RN Outcome: Progressing 11/05/2018 1054 by Herma Ard, RN Outcome: Not Progressing   Problem: Respiratory: Goal: Will maintain a patent airway 11/05/2018 1054 by Herma Ard, RN Outcome: Progressing 11/05/2018 1054 by Herma Ard, RN Outcome: Not Progressing Goal: Complications related to the disease process, condition or treatment will be avoided or minimized 11/05/2018 1054 by Herma Ard, RN Outcome: Progressing 11/05/2018 1054 by Herma Ard, RN Outcome: Not Progressing

## 2018-11-05 NOTE — Progress Notes (Signed)
I called [facility] where the patient is a resident and spoke with her nurse at the facility. She gave me more information regarding the patient's baseline. This patient is normally alert and oriented, able to hold conversations appropriately, ambulates with a walker, feeds herself, and has a regular diet with thin liquids. They stated that the patient has never had any episodes of agitation or being combative. This nurse Elmyra Ricks informed me that the patient's point of contact (sister Jilda Roche) is also a patient at their facility. I explained that we have been unsuccessful contacting Ms. Cousin. The nurse stated she will let Ms. Cousin know we have been trying to contact her and pass along the number for the nurse's station here to call for any update.

## 2018-11-05 NOTE — Progress Notes (Signed)
Called and spoke with the patient's sister [name]. I updated her on the patient's condition and the plan of care. I answered all of her questions and she denied any other concerns.

## 2018-11-05 NOTE — Progress Notes (Signed)
  Speech Language Pathology Treatment: Dysphagia  Patient Details Name: Lori Petersen [facility] MRN: [id number] DOB: 1955/05/08 Today's Date: 11/05/2018 Time: 2297-9892 SLP Time Calculation (min) (ACUTE ONLY): 13 min  Assessment / Plan / Recommendation Clinical Impression  Pt with improved Sp02 today on 3 liters [place], maintaining oxygen saturations in low 90s and with improved RR.  Pt repeating "no" frequently to interventions, but was willing to accept limited POs.  Repositioned with RN help to optimize safety.  Pt self-fed thin water from a straw with no s/s of aspiration over multiple boluses.  She accepted several teaspoons of applesauce with adequate attention, seemingly brisk swallow, no oral residue post-swallow.  There was no coughing during any PO intake.  Coordination of swallow/respiratory functions is improved. Pt declined further POs (stating "no" repeatedly), but intake was sufficient to deem her appropriate to begin a PO diet.  Recommend starting dysphagia 1, thin liquids; give meds in water or whole in puree.  Assist with feeding as needed. SLP will follow briefly to ensure safety and address diet progression (baseline diet from SNF unknown).    HPI HPI: 63 y.o. female, with history of Down syndrome, hypertension, dyslipidemia, arthritis, GERD, hypothyroidism, chronic diastolic dysfunction no echo report in chart.  Patient lives in nursing home at [facility], she was found to be hypoxic and there was a breakout of COVID-19 infection.  She was diagnosed with COVID-19 infection at the nursing home and transferred to [facility] on 11/02/2018 with severe acute hypoxic respiratory failure due to COVID-19 pneumonitis requiring nonrebreather mask.  She had extremely elevated inflammatory markers.  She was transferred to [facility] for further care late in the evening of 11/03/2018.      SLP Plan  Continue with current plan of care       Recommendations  Diet recommendations: Dysphagia 1 (puree);Thin  liquid Liquids provided via: Cup;Straw Medication Administration: Whole meds with liquid Supervision: Staff to assist with self feeding;Patient able to self feed Compensations: Minimize environmental distractions Postural Changes and/or Swallow Maneuvers: Seated upright 90 degrees                Oral Care Recommendations: Oral care BID Follow up Recommendations: Skilled Nursing facility SLP Visit Diagnosis: Dysphagia, unspecified (R13.10) Plan: Continue with current plan of care       GO               Lori Petersen L. Tivis Ringer, [place] CCC/SLP Acute Rehabilitation Services Office number [phone]   Lori Petersen 11/05/2018, 11:36 AM

## 2018-11-05 NOTE — TOC Initial Note (Signed)
Transition of Care [facility]) - Initial/Assessment Note    Patient Details  Name: Lori Petersen [facility] MRN: [id number] Date of Birth: 1955-02-04  Transition of Care [facility]) CM/SW Contact:    Amador Cunas, [place] Phone Number: 11/05/2018, 1:42 PM  Clinical Narrative:  Pt admitted from [facility] where she is currently a LTC resident. Spoke to pt's sister/guardian, Jilda Roche [phone], who reports she has been a resident at [facility] since February after a femur fracture and had pt placed there with her as she is her primary caregiver. Guardian confirmed plan is for pt to return to [facility] at d/c. SW, Erin, spoke with Otila Kluver at [facility] who confirmed pt able to return pending medical clearance. Pt currently not appropriate for return to SNF due to 15L HFNC. SW will follow and intervene as indicated.   Wandra Feinstein, MSW, LCSW [phone] (coverage)                     Expected Discharge Plan: [place] Barriers to Discharge: Continued Medical Work up   Patient Goals and CMS Choice Patient states their goals for this hospitalization and ongoing recovery are:: Not oriented. Sister/guardian wants pt to return to [facility] CMS Medicare.gov Compare Post Acute Care list provided to:: Other (Comment Required)(plan for return to previous SNF) Choice offered to / list presented to : NA  Expected Discharge Plan and Services Expected Discharge Plan: [place] Choice: [place] arrangements for the past 2 months: [place]                                      Prior Living Arrangements/Services Living arrangements for the past 2 months: [place] Lives with:: Siblings(sister lives at [facility]) Patient language and need for interpreter reviewed:: No Do you feel safe going back to the place where you live?: Yes      Need for Family Participation in  Patient Care: No (Comment) Care giver support system in place?: Yes (comment)   Criminal Activity/Legal Involvement Pertinent to Current Situation/Hospitalization: No - Comment as needed  Activities of Daily Living   ADL Screening (condition at time of admission) Patient's cognitive ability adequate to safely complete daily activities?: No Is the patient deaf or have difficulty hearing?: No Does the patient have difficulty seeing, even when wearing glasses/contacts?: Yes Does the patient have difficulty concentrating, remembering, or making decisions?: Yes Patient able to express need for assistance with ADLs?: No Does the patient have difficulty dressing or bathing?: Yes Independently performs ADLs?: No Does the patient have difficulty walking or climbing stairs?: Yes Weakness of Legs: Both Weakness of Arms/Hands: Both  Permission Sought/Granted Permission sought to share information with : Chartered certified accountant granted to share information with : Yes, Verbal Permission Granted     Permission granted to share info w AGENCY: [facility]        Emotional Assessment Appearance:: Appears stated age     Orientation: : Oriented to Self Alcohol / Substance Use: Never Used Psych Involvement: No (comment)  Admission diagnosis:  COVID-19 VIRUS INFECTION Patient Active Problem List   Diagnosis Date Noted  . COVID-19 virus infection 11/03/2018  . COVID-19   . Acute respiratory failure with hypoxia ([place]) 11/02/2018  . Left tibial fracture 04/27/2017  . Right tibial fracture 04/27/2017  .  Problems with swallowing and mastication   . Blood in stool   . Bradycardia 05/14/2015  . 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 Onychomycosis    PCP:  [facility] Pharmacy:   [facility] [address] [place] - [address] [place] [address] Phone: [phone] Fax: [phone]  [facility] [phone] [place], [place] [address] [place] [address] Phone: [phone] Fax: [phone]     Social Determinants of Health (SDOH) Interventions    Readmission Risk Interventions No flowsheet data found.

## 2018-11-05 NOTE — Progress Notes (Signed)
PROGRESS NOTE                                                                                                                                                                                                             Patient Demographics:    Lori Petersen, is a 63 y.o. female, DOB - 1955/05/18, NGE:[id number]  Outpatient Primary MD for the patient is [facility] [place] date - 11/03/2018    No chief complaint on file.      Brief narrative. Lori Petersen  is a 63 y.o. female, with history of Down syndrome, hypertension, dyslipidemia, arthritis, GERD, hypothyroidism, chronic diastolic dysfunction no echo report in chart.  Patient lives in nursing home at [facility], she was found to be hypoxic and there was a breakout of COVID-19 infection.  She was diagnosed with COVID-19 infection at the nursing home and transferred to [facility] on 11/02/2018 with severe acute hypoxic respiratory failure due to COVID-19 pneumonitis requiring nonrebreather mask.  She had extremely elevated inflammatory markers.  She was transferred to [name] for further care late in the evening of 11/03/2018.     Subjective:   Patient in bed, appears comfortable, denies any headache, no fever, no chest pain or pressure, no shortness of breath , no abdominal pain. No focal weakness. Is mildly confused.   Assessment  & Plan :     1.  Severe acute hypoxic respiratory failure due to COVID-19 pneumonitis.  She has spent 1 day at [facility], respiratory failure is extremely advanced and she was on 15 L flow nasal cannula and still breathing in upper 30s at the time of admission.    Was promptly started on IV steroids remdesivir and given a dose of Actemra, she has shown remarkable improvement in both her oxygen requirements and respiratory effort.  Kindly see H&P for consent.  Patient is still tenuous but much improved from the  day of admission.    Changed her POX probe from L. Small toe to ear lobe, o2 down from 12 lits to 2lits, Pox 97% - RN Wells Guiles informed on 10/20 9.35 am.   COVID-19 Labs  Recent Labs    11/02/18 1605  11/03/18 0559 11/03/18 0600 11/04/18 0240 11/05/18 0215  DDIMER  --   --   --   --  2.34* 3.35*  FERRITIN 1,216*  --  1,453*  --   --   --   CRP  --    < >  --  22.2* 15.9* 9.0*   < > = values in this interval not displayed.    Lab Results  Component Value Date   SARSCOV2NAA POSITIVE (A) 11/02/2018     SpO2: 95 % O2 Flow Rate (L/min): 8 L/min  Hepatic Function Latest Ref Rng & Units 11/05/2018 11/04/2018 11/03/2018  Total Protein 6.5 - 8.1 g/dL 6.4(L) 6.5 6.7  Albumin 3.5 - 5.0 g/dL 2.3(L) 2.5(L) 2.6(L)  AST 15 - 41 U/L 43(H) 50(H) 55(H)  ALT 0 - 44 U/L 25 28 31   Alk Phosphatase 38 - 126 U/L 37(L) 37(L) 36(L)  Total Bilirubin 0.3 - 1.2 mg/dL 0.5 0.5 0.6  Bilirubin, Direct 0.0 - 0.2 mg/dL - - -        Component Value Date/Time   BNP 73.8 11/05/2018 0215      2.  Acute on chronic diastolic CHF.    Has been diuresed.  Now compensated.  3.  Down's syndrome.  Supportive care.  4.  AKI with hypernatremia. Increase D5W.  She has a Foley.  Will check urine electrolytes to.  5.  Mild transaminitis.  Due to COVID-19 infection.  Trend and monitor.  Asymptomatic.  6. Dyslipidemia - statin.  7. GERD - PPI.  8. Metabolic Encephalopathy - Haldol  PRN.   Has Foley - PICC requested as IV access is poor.    Condition - Extremely Guarded  Family Communication  :  Called sister x 2 no response  Code Status :  DNR  Diet :   Diet Order            Diet NPO time specified Except for: Sips with Meds  Diet effective now               Disposition Plan  :  SNF  Consults  :     Procedures  :     PUD Prophylaxis :    DVT Prophylaxis  :  Lovenox    Lab Results  Component Value Date   PLT 199 11/05/2018    Inpatient Medications  Scheduled Meds: .  aspirin  81 mg Oral Daily  . Chlorhexidine Gluconate Cloth  6 each Topical Daily  . cholecalciferol  2,000 Units Oral Daily  . enoxaparin (LOVENOX) injection  50 mg Subcutaneous Q24H  . latanoprost  1 drop Left Eye QHS  . [START ON 11/06/2018] levothyroxine  88 mcg Oral QAC breakfast  . memantine  10 mg Oral BID  . methylPREDNISolone (SOLU-MEDROL) injection  40 mg Intravenous Q12H  . pantoprazole  40 mg Oral Daily  . pravastatin  40 mg Oral Daily  . senna  2 tablet Oral QHS  . sodium chloride flush  3 mL Intravenous Q12H   Continuous Infusions: . dextrose    . remdesivir 100 mg in NS 250 mL 100 mg (11/04/18 1634)   PRN Meds:.acetaminophen, albuterol, bisacodyl, haloperidol lactate, [DISCONTINUED] ondansetron **OR** ondansetron (ZOFRAN) IV  Antibiotics  :    Anti-infectives (From admission, onward)   Start     Dose/Rate Route Frequency Ordered Stop   11/04/18 1600  remdesivir 100 mg in sodium chloride 0.9 % 250 mL IVPB     100 mg 500 mL/hr over 30 Minutes Intravenous Every 24 hours 11/03/18 1755 11/07/18 1559   11/03/18 1830  remdesivir 100 mg in sodium chloride 0.9 %  250 mL IVPB     100 mg 500 mL/hr over 30 Minutes Intravenous  Once 11/03/18 1757 11/03/18 1917   11/03/18 1800  remdesivir 100 mg in sodium chloride 0.9 % 250 mL IVPB  Status:  Discontinued     100 mg 500 mL/hr over 30 Minutes Intravenous Every 24 hours 11/03/18 1755 11/03/18 1757       Time Spent in minutes  30   Lala Lund M.D on 11/05/2018 at 9:35 AM  To page go to [email or web address] - password [facility]  [facility] -  Office  [phone]    See all Orders from today for further details    Objective:   Vitals:   11/05/18 0410 11/05/18 0456 11/05/18 0726 11/05/18 0747  BP:  109/86 122/89   Pulse: 65 82 69   Resp:  20 20   Temp:  98.5 F (36.9 C) (!) 96.7 F (35.9 C)   TempSrc:  Oral Oral   SpO2: 93% 93% 94% 95%  Weight:      Height:        Wt Readings from Last 3 Encounters:  11/04/18  99.8 kg  11/02/18 99.8 kg  01/04/18 87.5 kg     Intake/Output Summary (Last 24 hours) at 11/05/2018 0935 Last data filed at 11/05/2018 0459 Gross per 24 hour  Intake 700 ml  Output 1525 ml  Net -825 ml     Physical Exam  Awake , mildly agitated,  No new F.N deficits,   [place].AT,PERRAL Supple Neck,No JVD, No cervical lymphadenopathy appriciated.  Symmetrical Chest wall movement, Good air movement bilaterally, CTAB RRR,No Gallops, Rubs or new Murmurs, No Parasternal Heave +ve B.Sounds, Abd Soft, No tenderness, No organomegaly appriciated, No rebound - guarding or rigidity. No Cyanosis, Clubbing or edema, No new Rash or bruise     Data Review:    CBC Recent Labs  Lab 11/02/18 1605 11/03/18 0559 11/04/18 0240 11/05/18 0215  WBC 13.9* 9.3 7.5 5.9  HGB 12.8 11.4* 11.7* 12.8  HCT 42.7 36.4 38.3 42.7  PLT 174 156 193 199  MCV 103.9* 101.7* 104.4* 104.7*  MCH 31.1 31.8 31.9 31.4  MCHC 30.0 31.3 30.5 30.0  RDW 15.6* 15.2 15.6* 15.7*  LYMPHSABS 0.7 0.6* 0.6* 0.4*  MONOABS 0.4 0.3 0.4 0.3  EOSABS 0.0 0.0 0.0 0.0  BASOSABS 0.0 0.1 0.0 0.0    Chemistries  Recent Labs  Lab 11/02/18 1605 11/02/18 1911 11/03/18 0559 11/04/18 0240 11/05/18 0215  NA 141  --  146* 148* 153*  K 4.7  --  4.4 4.9 4.9  CL 103  --  108 108 112*  CO2 24  --  27 29 29   GLUCOSE 219*  --  138* 151* 177*  BUN 36*  --  40* 40* 40*  CREATININE 1.71*  --  1.31* 1.20* 1.20*  CALCIUM 8.2*  --  7.5* 7.8* 7.8*  MG  --   --  2.5* 2.4 2.6*  AST  --  60* 55* 50* 43*  ALT  --  31 31 28 25   ALKPHOS  --  45 36* 37* 37*  BILITOT  --  0.7 0.6 0.5 0.5   ------------------------------------------------------------------------------------------------------------------ No results for input(s): CHOL, HDL, LDLCALC, TRIG, CHOLHDL, LDLDIRECT in the last 72 hours.  No results found for: HGBA1C ------------------------------------------------------------------------------------------------------------------ No  results for input(s): TSH, T4TOTAL, T3FREE, THYROIDAB in the last 72 hours.  Invalid input(s): FREET3  Cardiac Enzymes No results for input(s): CKMB, TROPONINI, MYOGLOBIN in the last 168 hours.  Invalid  input(s): CK ------------------------------------------------------------------------------------------------------------------    Component Value Date/Time   BNP 73.8 11/05/2018 0215    Micro Results Recent Results (from the past 240 hour(s))  SARS Coronavirus 2 by RT PCR (hospital order, performed in [facility] hospital lab) Nasopharyngeal Nasopharyngeal Swab     Status: Abnormal   Collection Time: 11/02/18  4:45 PM   Specimen: Nasopharyngeal Swab  Result Value Ref Range Status   SARS Coronavirus 2 POSITIVE (A) NEGATIVE Final    Comment: RESULT CALLED TO, READ BACK BY AND VERIFIED WITH: SMITH,A AT 1813 ON 11/02/2018 BY JPM (NOTE) If result is NEGATIVE SARS-CoV-2 target nucleic acids are NOT DETECTED. The SARS-CoV-2 RNA is generally detectable in upper and lower  respiratory specimens during the acute phase of infection. The lowest  concentration of SARS-CoV-2 viral copies this assay can detect is 250  copies / mL. A negative result does not preclude SARS-CoV-2 infection  and should not be used as the sole basis for treatment or other  patient management decisions.  A negative result may occur with  improper specimen collection / handling, submission of specimen other  than nasopharyngeal swab, presence of viral mutation(s) within the  areas targeted by this assay, and inadequate number of viral copies  (<250 copies / mL). A negative result must be combined with clinical  observations, patient history, and epidemiological information. If result is POSITIVE SARS-CoV-2 target nucleic acids are DETECTED.  The SARS-CoV-2 RNA is generally detectable in upper and lower  respiratory specimens during the acute phase of infection.  Positive  results are indicative of active  infection with SARS-CoV-2.  Clinical  correlation with patient history and other diagnostic information is  necessary to determine patient infection status.  Positive results do  not rule out bacterial infection or co-infection with other viruses. If result is PRESUMPTIVE POSTIVE SARS-CoV-2 nucleic acids MAY BE PRESENT.   A presumptive positive result was obtained on the submitted specimen  and confirmed on repeat testing.  While 2019 novel coronavirus  (SARS-CoV-2) nucleic acids may be present in the submitted sample  additional confirmatory testing may be necessary for epidemiological  and / or clinical management purposes  to differentiate between  SARS-CoV-2 and other Sarbecovirus currently known to infect humans.  If clinically indicated additional testing with an alternate test  methodology [phone]) i s advised. The SARS-CoV-2 RNA is generally  detectable in upper and lower respiratory specimens during the acute  phase of infection. The expected result is Negative. Fact Sheet for Patients:  StrictlyIdeas.no Fact Sheet for Healthcare Providers: BankingDealers.co.za This test is not yet approved or cleared by the [place] FDA and has been authorized for detection and/or diagnosis of SARS-CoV-2 by FDA under an Emergency Use Authorization (EUA).  This EUA will remain in effect (meaning this test can be used) for the duration of the COVID-19 declaration under Section 564(b)(1) of the Act, 21 U.S.C. section 360bbb-3(b)(1), unless the authorization is terminated or revoked sooner. Performed at [facility], [address] [place], [place] [address]   MRSA PCR Screening     Status: None   Collection Time: 11/03/18  6:24 AM   Specimen: Nasal Mucosa; Nasopharyngeal  Result Value Ref Range Status   MRSA by PCR NEGATIVE NEGATIVE Final    Comment:        The GeneXpert MRSA Assay (FDA approved for NASAL specimens only), is  one component of a comprehensive MRSA colonization surveillance program. It is not intended to diagnose MRSA infection nor to guide  or monitor treatment for MRSA infections. Performed at [facility], [address] [place], [place] [address]   CULTURE, BLOOD (ROUTINE X 2) w Reflex to ID Panel     Status: None (Preliminary result)   Collection Time: 11/03/18 12:44 PM   Specimen: BLOOD  Result Value Ref Range Status   Specimen Description BLOOD L HAND  Final   Special Requests   Final    BOTTLES DRAWN AEROBIC AND ANAEROBIC Blood Culture adequate volume   Culture   Final    NO GROWTH 2 DAYS Performed at [facility], [address] [place], [place] [address]    Report Status PENDING  Incomplete    Radiology Reports Dg Chest Portable 1 View  Result Date: 11/02/2018 CLINICAL DATA:  Acute shortness of breath. COVID-19 positive. EXAM: PORTABLE CHEST 1 VIEW COMPARISON:  01/04/2018 FINDINGS: Cardiomegaly and new bilateral airspace opacities are noted. No definite pleural effusion or pneumothorax. No acute bony abnormalities are present. IMPRESSION: Cardiomegaly with new bilateral airspace opacities - question edema and/or infection/pneumonia. Electronically Signed   By: Margarette Canada M.D.   On: 11/02/2018 17:06

## 2018-11-06 DIAGNOSIS — E039 Hypothyroidism, unspecified: Secondary | ICD-10-CM

## 2018-11-06 DIAGNOSIS — I5032 Chronic diastolic (congestive) heart failure: Secondary | ICD-10-CM

## 2018-11-06 DIAGNOSIS — J9600 Acute respiratory failure, unspecified whether with hypoxia or hypercapnia: Secondary | ICD-10-CM

## 2018-11-06 DIAGNOSIS — G9341 Metabolic encephalopathy: Secondary | ICD-10-CM

## 2018-11-06 LAB — COMPREHENSIVE METABOLIC PANEL
ALT: 24 U/L (ref 0–44)
AST: 39 U/L (ref 15–41)
Albumin: 2.3 g/dL — ABNORMAL LOW (ref 3.5–5.0)
Alkaline Phosphatase: 44 U/L (ref 38–126)
Anion gap: 11 (ref 5–15)
BUN: 40 mg/dL — ABNORMAL HIGH (ref 8–23)
CO2: 28 mmol/L (ref 22–32)
Calcium: 7.6 mg/dL — ABNORMAL LOW (ref 8.9–10.3)
Chloride: 105 mmol/L (ref 98–111)
Creatinine, Ser: 1.32 mg/dL — ABNORMAL HIGH (ref 0.44–1.00)
GFR calc Af Amer: 50 mL/min — ABNORMAL LOW (ref >60)
GFR calc non Af Amer: 43 mL/min — ABNORMAL LOW (ref >60)
Glucose, Bld: 331 mg/dL — ABNORMAL HIGH (ref 70–99)
Potassium: 4.2 mmol/L (ref 3.5–5.1)
Sodium: 144 mmol/L (ref 135–145)
Total Bilirubin: 0.8 mg/dL (ref 0.3–1.2)
Total Protein: 6.3 g/dL — ABNORMAL LOW (ref 6.5–8.1)

## 2018-11-06 LAB — CBC WITH DIFFERENTIAL/PLATELET
Abs Immature Granulocytes: 1.9 10*3/uL — ABNORMAL HIGH (ref 0.00–0.07)
Band Neutrophils: 4 %
Basophils Absolute: 0 10*3/uL (ref 0.0–0.1)
Basophils Relative: 0 %
Eosinophils Absolute: 0 10*3/uL (ref 0.0–0.5)
Eosinophils Relative: 0 %
HCT: 46.6 % — ABNORMAL HIGH (ref 36.0–46.0)
Hemoglobin: 14.3 g/dL (ref 12.0–15.0)
Lymphocytes Relative: 6 %
Lymphs Abs: 0.7 10*3/uL (ref 0.7–4.0)
MCH: 31.8 pg (ref 26.0–34.0)
MCHC: 30.7 g/dL (ref 30.0–36.0)
MCV: 103.8 fL — ABNORMAL HIGH (ref 80.0–100.0)
Metamyelocytes Relative: 4 %
Monocytes Absolute: 0.5 10*3/uL (ref 0.1–1.0)
Monocytes Relative: 4 %
Myelocytes: 11 %
Neutro Abs: 9.3 10*3/uL — ABNORMAL HIGH (ref 1.7–7.7)
Neutrophils Relative %: 71 %
Platelets: 244 10*3/uL (ref 150–400)
RBC: 4.49 MIL/uL (ref 3.87–5.11)
RDW: 15.6 % — ABNORMAL HIGH (ref 11.5–15.5)
WBC: 12.4 10*3/uL — ABNORMAL HIGH (ref 4.0–10.5)
nRBC: 2.6 % — ABNORMAL HIGH (ref 0.0–0.2)

## 2018-11-06 LAB — C-REACTIVE PROTEIN: CRP: 4.2 mg/dL — ABNORMAL HIGH (ref <1.0)

## 2018-11-06 LAB — BRAIN NATRIURETIC PEPTIDE: B Natriuretic Peptide: 60.3 pg/mL (ref 0.0–100.0)

## 2018-11-06 LAB — D-DIMER, QUANTITATIVE: D-Dimer, Quant: 5.41 ug{FEU}/mL — ABNORMAL HIGH (ref 0.00–0.50)

## 2018-11-06 LAB — MAGNESIUM: Magnesium: 2.5 mg/dL — ABNORMAL HIGH (ref 1.7–2.4)

## 2018-11-06 MED ORDER — DEXAMETHASONE SODIUM PHOSPHATE 10 MG/ML IJ SOLN
6.0000 mg | INTRAMUSCULAR | Status: DC
  Administered 2018-11-07: 6 mg via INTRAVENOUS
  Filled 2018-11-06: qty 1

## 2018-11-06 MED ORDER — ENOXAPARIN SODIUM 60 MG/0.6ML ~~LOC~~ SOLN
50.0000 mg | Freq: Two times a day (BID) | SUBCUTANEOUS | Status: DC
  Administered 2018-11-06 – 2018-11-07 (×2): 50 mg via SUBCUTANEOUS
  Filled 2018-11-06: qty 0.6

## 2018-11-06 MED ORDER — HALOPERIDOL LACTATE 5 MG/ML IJ SOLN: 1.0000 mg | Freq: Three times a day (TID) | INTRAMUSCULAR | Status: DC | PRN

## 2018-11-06 NOTE — Progress Notes (Addendum)
PROGRESS NOTE    Ritha Sampedro [facility]  EQA:[id number] DOB: 01-29-1955 DOA: 11/03/2018 PCP: [facility]    Brief Narrative:  63 year old female who presented with dyspnea. She does have significant past medical history of Down syndrome, hypertension, dyslipidemia, arthritis, GERD, hypothyroidism and diastolic heart failure.  She lives at a nursing home in [place], where he was found hypoxic, she was diagnosed with SARS COVID-19 October 17.  She did complain of dyspnea on admission, her answers were limited due to cognitive dysfunction. On her initial physical examination her blood pressure was 67/71, pulse rate 96, temperature 98.1, respiratory rate 40, oxygen saturation 90%.  Her lungs had bilateral rales, heart S1-S2 present, rhythmic, abdomen soft, no lower extremity edema. Sodium 146, potassium 4.4, chloride 108, bicarb 27, glucose 138, BUN 40, creatinine 1.31, white count 9.3, hemoglobin 9.4, hematocrit 36.4, platelets 156.  Urinalysis negative for infection.  Chest radiograph had bilateral alveolar infiltrates, right lower lobe and left lower lobe, predominantly on the left.  EKG 110 bpm, normal axis, normal intervals, sinus rhythm, no ST segment T wave changes, positive artifact.  Patient was admitted to the hospital working diagnosis of SARS COVID-19 viral pneumonia complicated by acute hypoxic respiratory failure.   Assessment & Plan:   Principal Problem:   Respiratory failure, acute (HCC)/ hypoxemic  Active Problems:   Mixed Alzheimer's and vascular dementia (HCC)   COVID-19 virus infection   Obesity, Class III, BMI 40-49.9 (morbid obesity) (HCC)   Chronic diastolic CHF (congestive heart failure) (HCC)   Acute metabolic encephalopathy  1. Acute hypoxic respiratory failure due to SARS COVID 19 viral pneumonia. Patient has been confused and agitated, has one to one sitter in place. Positive cough.   RR: 20 to 24  Fi02: 32% Fi02, 3 LPM per [place] Pulse oxymetry:  93 to 94%   COVID-19 Labs  Recent Labs    11/04/18 0240 11/05/18 0215 11/06/18 1015  DDIMER 2.34* 3.35* 5.41*  CRP 15.9* 9.0* 4.2*   Ferritin 1,216, 1,453  Will continue medical therapy with Remdesivir #5/5. (AST 39, ALT 24) On systemic corticosteroids with dexamethasone 6 mg IV daily. Continue albuterol and will add guiafenesin. Noted worsening d dimer, will continue close monitoring, increased prophylactic dose of enoxaparin per protocol.   2. Acute on chronic diastolic heart failure. Patient initially diuresed, now euvolemic. Difficult to assess volume status due to obesity.   3. AKI with hypernatremia. Stable renal function with serum cr at 1,2 to 1,3, K at 4,2 and serum Na down to 144,. Patient had  hydration with D5W, follow on renal panel in am.   4. Elevated liver enzymes. Resolved with AST 39 and ALT at 24.  5. Obesity with dyslipidemia. Calculated BMI is 35,5. Continue with statin therapy.  6. GERD. Continue with proton pump inh.   7. Metabolic encephalopathy. Patient continue to be confused and disorientated, continue to have sitter at the bedside. Will continue as needed haldol, last dose yesterday 5 mg, will change to 1 mg.   8. Down's syndrome. Continue with memantine.   9. Hypothyroid. Continue with levothyroxine.   DVT prophylaxis: enoxaparin high dose   Code Status: dnr  Family Communication: no family at the bedside   Disposition Plan/ discharge barriers: pending clinical improvement.   Body mass index is 35.51 kg/m. Malnutrition Type:      Malnutrition Characteristics:      Nutrition Interventions:     RN Pressure Injury Documentation:     Consultants:  Procedures:     Antimicrobials:       Subjective: Patient has been confused and disorientates, no chest pain or dyspnea, positive dry cough, no nausea or vomiting, and tolerating po well.   Objective: Vitals:   11/05/18 1012 11/05/18 1541 11/05/18 1923 11/06/18 0333   BP:  113/69  107/61  Pulse:  98    Resp:  (!) 24 (!) 24 20  Temp:  97.9 F (36.6 C) 98.2 F (36.8 C) 97.8 F (36.6 C)  TempSrc:  Oral Axillary Axillary  SpO2: 93% 98% 94% 94%  Weight:      Height:        Intake/Output Summary (Last 24 hours) at 11/06/2018 0820 Last data filed at 11/06/2018 1610 Gross per 24 hour  Intake 1040 ml  Output 750 ml  Net 290 ml   Filed Weights   11/04/18 0526  Weight: 99.8 kg    Examination:   General: Not in pain or dyspnea, deconditioned  Neurology: patient sleeping at the time of my examination.  E ENT: mild pallor, no icterus, oral mucosa moist Cardiovascular: No JVD. S1-S2 present, rhythmic, no gallops, rubs, or murmurs. No lower extremity edema. Pulmonary: positive breath sounds bilaterally. Gastrointestinal. Abdomen protuberant with no organomegaly, non tender, no rebound or guarding Skin. No rashes Musculoskeletal: no joint deformities     Data Reviewed: I have personally reviewed following labs and imaging studies  CBC: Recent Labs  Lab 11/02/18 1605 11/03/18 0559 11/04/18 0240 11/05/18 0215  WBC 13.9* 9.3 7.5 5.9  NEUTROABS 12.4* 7.7 5.9 4.4  HGB 12.8 11.4* 11.7* 12.8  HCT 42.7 36.4 38.3 42.7  MCV 103.9* 101.7* 104.4* 104.7*  PLT 174 156 193 960   Basic Metabolic Panel: Recent Labs  Lab 11/02/18 1605 11/03/18 0559 11/04/18 0240 11/05/18 0215  NA 141 146* 148* 153*  K 4.7 4.4 4.9 4.9  CL 103 108 108 112*  CO2 24 27 29 29   GLUCOSE 219* 138* 151* 177*  BUN 36* 40* 40* 40*  CREATININE 1.71* 1.31* 1.20* 1.20*  CALCIUM 8.2* 7.5* 7.8* 7.8*  MG  --  2.5* 2.4 2.6*  PHOS  --  3.4  --   --    GFR: Estimated Creatinine Clearance: 57.9 mL/min (A) (by C-G formula based on SCr of 1.2 mg/dL (H)). Liver Function Tests: Recent Labs  Lab 11/02/18 1911 11/03/18 0559 11/04/18 0240 11/05/18 0215  AST 60* 55* 50* 43*  ALT 31 31 28 25   ALKPHOS 45 36* 37* 37*  BILITOT 0.7 0.6 0.5 0.5  PROT 7.3 6.7 6.5 6.4*  ALBUMIN  2.7* 2.6* 2.5* 2.3*   No results for input(s): LIPASE, AMYLASE in the last 168 hours. No results for input(s): AMMONIA in the last 168 hours. Coagulation Profile: No results for input(s): INR, PROTIME in the last 168 hours. Cardiac Enzymes: No results for input(s): CKTOTAL, CKMB, CKMBINDEX, TROPONINI in the last 168 hours. BNP (last 3 results) No results for input(s): PROBNP in the last 8760 hours. HbA1C: No results for input(s): HGBA1C in the last 72 hours. CBG: No results for input(s): GLUCAP in the last 168 hours. Lipid Profile: No results for input(s): CHOL, HDL, LDLCALC, TRIG, CHOLHDL, LDLDIRECT in the last 72 hours. Thyroid Function Tests: No results for input(s): TSH, T4TOTAL, FREET4, T3FREE, THYROIDAB in the last 72 hours. Anemia Panel: No results for input(s): VITAMINB12, FOLATE, FERRITIN, TIBC, IRON, RETICCTPCT in the last 72 hours.    Radiology Studies: I have reviewed all of the imaging during this hospital  visit personally     Scheduled Meds: . aspirin  81 mg Oral Daily  . Chlorhexidine Gluconate Cloth  6 each Topical Daily  . cholecalciferol  2,000 Units Oral Daily  . enoxaparin (LOVENOX) injection  50 mg Subcutaneous Q24H  . latanoprost  1 drop Left Eye QHS  . levothyroxine  88 mcg Oral QAC breakfast  . memantine  10 mg Oral BID  . methylPREDNISolone (SOLU-MEDROL) injection  40 mg Intravenous Q12H  . pantoprazole  40 mg Oral Daily  . pravastatin  40 mg Oral Daily  . senna  2 tablet Oral QHS  . sodium chloride flush  10-40 mL Intracatheter Q12H  . sodium chloride flush  3 mL Intravenous Q12H   Continuous Infusions: . remdesivir 100 mg in NS 250 mL 100 mg (11/05/18 1658)     LOS: 3 days        Mauricio Annett Gulaaniel Arrien, MD

## 2018-11-06 NOTE — Progress Notes (Signed)
Primary care MD, Dr. Wilford Grist, called for update on patient.  Cell: [phone] [place]  # [phone]. States that she wants a call when patient is discharged from social work as well as from physician regarding patient and care.  States that at baseline patient is able to walk with a walker due to severe pigeon toe.  States that pt is in nursing facility due to sister being guardian yet sister has had broken bones and is unable to care for herself and pt at home.  States that [place] is like its own insurance, etc and needs called prior to transfers back to facility.

## 2018-11-06 NOTE — Progress Notes (Signed)
SLP Cancellation Note  Patient Details Name: Lori Petersen [facility] MRN: [id number] DOB: [birth year]   Cancelled treatment:       Reason Eval/Treat Not Completed: Fatigue/lethargy limiting ability to participate; Pt very sleepy, difficult to arouse; sitter present.  Per notes, pt was eating a regular diet at facility.  Will continue efforts to see.  Lori Aybar L. Tivis Ringer, MA CCC/SLP Acute Rehabilitation Services Office number [phone]     Lori Petersen 11/06/2018, 12:00 PM

## 2018-11-06 NOTE — Progress Notes (Signed)
Inpatient Diabetes Program Recommendations  AACE/ADA: New Consensus Statement on Inpatient Glycemic Control (2015)  Target Ranges:  Prepandial:   less than 140 mg/dL      Peak postprandial:   less than 180 mg/dL (1-2 hours)      Critically ill patients:  140 - 180 mg/dL   Results for CRESSIE, BETZLER (MRN [id number]) as of 11/06/2018 15:14  Ref. Range 11/03/2018 05:59 11/04/2018 02:40 11/05/2018 02:15 11/06/2018 10:15  Glucose Latest Ref Range: 70 - 99 mg/dL 138 (H) 151 (H) 177 (H) 331 (H)    Admit with: COVID 19  History: Down Syndrome, HTN    MD- Note patient had been getting Solumedrol 40 mg BID--Last dose Solumedrol given this morning at 5am.  Now getting Decadron 6 mg Daily.  Lab glucose elevated to 331 mg/dl this AM.  May consider placing orders for CBG checks and adding Novolog Sensitive Correction Scale/ SSI (0-9 units) TID AC + HS if CBGs elevated while getting steroids      --Will follow patient during hospitalization--  Wyn Quaker RN, MSN, CDE Diabetes Coordinator Inpatient Glycemic Control Team Team Pager: [phone] (8a-5p)

## 2018-11-06 NOTE — Progress Notes (Signed)
Tried calling sister for update but was unable to get her on phone at this time.

## 2018-11-07 ENCOUNTER — Inpatient Hospital Stay (HOSPITAL_COMMUNITY): Payer: Medicare (Managed Care)

## 2018-11-07 DIAGNOSIS — R7989 Other specified abnormal findings of blood chemistry: Secondary | ICD-10-CM

## 2018-11-07 DIAGNOSIS — E039 Hypothyroidism, unspecified: Secondary | ICD-10-CM

## 2018-11-07 DIAGNOSIS — J9601 Acute respiratory failure with hypoxia: Secondary | ICD-10-CM

## 2018-11-07 LAB — COMPREHENSIVE METABOLIC PANEL
ALT: 21 U/L (ref 0–44)
AST: 28 U/L (ref 15–41)
Albumin: 2.1 g/dL — ABNORMAL LOW (ref 3.5–5.0)
Alkaline Phosphatase: 41 U/L (ref 38–126)
Anion gap: 11 (ref 5–15)
BUN: 46 mg/dL — ABNORMAL HIGH (ref 8–23)
CO2: 28 mmol/L (ref 22–32)
Calcium: 7.3 mg/dL — ABNORMAL LOW (ref 8.9–10.3)
Chloride: 106 mmol/L (ref 98–111)
Creatinine, Ser: 1.32 mg/dL — ABNORMAL HIGH (ref 0.44–1.00)
GFR calc Af Amer: 50 mL/min — ABNORMAL LOW (ref >60)
GFR calc non Af Amer: 43 mL/min — ABNORMAL LOW (ref >60)
Glucose, Bld: 244 mg/dL — ABNORMAL HIGH (ref 70–99)
Potassium: 4.2 mmol/L (ref 3.5–5.1)
Sodium: 145 mmol/L (ref 135–145)
Total Bilirubin: 0.8 mg/dL (ref 0.3–1.2)
Total Protein: 5.6 g/dL — ABNORMAL LOW (ref 6.5–8.1)

## 2018-11-07 LAB — FERRITIN: Ferritin: 946 ng/mL — ABNORMAL HIGH (ref 11–307)

## 2018-11-07 LAB — CBC WITH DIFFERENTIAL/PLATELET
Abs Immature Granulocytes: 1.6 10*3/uL — ABNORMAL HIGH (ref 0.00–0.07)
Band Neutrophils: 3 %
Basophils Absolute: 0 10*3/uL (ref 0.0–0.1)
Basophils Relative: 0 %
Eosinophils Absolute: 0 10*3/uL (ref 0.0–0.5)
Eosinophils Relative: 0 %
HCT: 42.2 % (ref 36.0–46.0)
Hemoglobin: 12.8 g/dL (ref 12.0–15.0)
Lymphocytes Relative: 4 %
Lymphs Abs: 0.5 10*3/uL — ABNORMAL LOW (ref 0.7–4.0)
MCH: 31.6 pg (ref 26.0–34.0)
MCHC: 30.3 g/dL (ref 30.0–36.0)
MCV: 104.2 fL — ABNORMAL HIGH (ref 80.0–100.0)
Metamyelocytes Relative: 4 %
Monocytes Absolute: 0.6 10*3/uL (ref 0.1–1.0)
Monocytes Relative: 5 %
Myelocytes: 9 %
Neutro Abs: 9.4 10*3/uL — ABNORMAL HIGH (ref 1.7–7.7)
Neutrophils Relative %: 75 %
Platelets: 227 10*3/uL (ref 150–400)
RBC: 4.05 MIL/uL (ref 3.87–5.11)
RDW: 15.7 % — ABNORMAL HIGH (ref 11.5–15.5)
WBC: 12 10*3/uL — ABNORMAL HIGH (ref 4.0–10.5)
nRBC: 2.8 % — ABNORMAL HIGH (ref 0.0–0.2)

## 2018-11-07 LAB — D-DIMER, QUANTITATIVE: D-Dimer, Quant: 6.44 ug/mL-FEU — ABNORMAL HIGH (ref 0.00–0.50)

## 2018-11-07 LAB — C-REACTIVE PROTEIN: CRP: 2.5 mg/dL — ABNORMAL HIGH (ref <1.0)

## 2018-11-07 MED ORDER — ENOXAPARIN SODIUM 60 MG/0.6ML ~~LOC~~ SOLN: 50.0000 mg | Freq: Two times a day (BID) | SUBCUTANEOUS | 0 refills | Status: DC

## 2018-11-07 NOTE — Progress Notes (Signed)
Physical Therapy Treatment Patient Details Name: Lori Petersen [facility] MRN: [id number] DOB: 30-Dec-1955 Today's Date: 11/07/2018    History of Present Illness Lori Petersen  is a 63 y.o. female, with history of Down syndrome, hypertension, dyslipidemia, arthritis, GERD, hypothyroidism, chronic diastolic dysfunction no echo report in chart.  Patient lives in nursing home at [facility], she was found to be hypoxic and there was a breakout of COVID-19 infection.  She was diagnosed with COVID-19 infection at the nursing home and transferred to [facility] on 11/02/2018 with severe acute hypoxic respiratory failure due to COVID-19 pneumonitis requiring nonrebreather mask.    PT Comments    The patient is awake, did follow simple directions inconsistently. Assisted to sitting on bed edge. Attempted to stand x 3 with 2 max/total assist. Patient did not clear bed. Assisted  Patient back into bed. Will need lift equipment-SARA STEDY may be adequate, it was not available at the time  Of standing. Per RN report, PTA, patient was ambualtory. Patient curently requires extensive assistance. Patient on 3 L [place] at 95%. On RA for mobility 91% on RA. Replaced on 3 L. Continue PT for mobility.   Follow Up Recommendations  SNF;Supervision/Assistance - 24 hour     Equipment Recommendations  None recommended by PT    Recommendations for Other Services       Precautions / Restrictions Precautions Precautions: Fall Precaution Comments: monitor SPO2, incontinent, check first Restrictions Weight Bearing Restrictions: No    Mobility  Bed Mobility Overal bed mobility: Needs Assistance Bed Mobility: Rolling;Supine to Sit;Sit to Supine Rolling: +2 for safety/equipment;+2 for physical assistance;Max assist   Supine to sit: +2 for safety/equipment;+2 for physical assistance;Total assist Sit to supine: +2 for safety/equipment;+2 for physical assistance;Total assist   General bed mobility comments: patient does not  offer much assistance to sitting on bed edge. required assist with trunk and legs to get back  INTO BED, PT SOMEWHAT LOW TOONE AT THIS POINT  Transfers Overall transfer level: Needs assistance Equipment used: Rolling walker (2 wheeled) Transfers: Sit to/from Stand Sit to Stand: +2 safety/equipment;From elevated surface;+2 physical assistance         General transfer comment: attempted to stand at [facility], then stedy. Unable to stand with maximum effort of 2 assisting. Could not safely sand to use STEDY. Assisted  pt back into bed.  Ambulation/Gait                 Stairs             Wheelchair Mobility    Modified Rankin (Stroke Patients Only)       Balance                                            Cognition Arousal/Alertness: Awake/alert   Overall Cognitive Status: No family/caregiver present to determine baseline cognitive functioning Area of Impairment: Orientation;Problem solving;Attention;Following commands;Awareness                 Orientation Level: Place;Time;Situation Current Attention Level: Selective   Following Commands: Follows one step commands inconsistently   Awareness: Intellectual Problem Solving: Slow processing;Decreased initiation General Comments: patient did follow simple one step directions for rolling and sitting up with multimodal cues.Patient did  respond with yeah and no more appropriately      Exercises      General Comments  Pertinent Vitals/Pain Pain Assessment: No/denies pain    Home Living                      Prior Function            PT Goals (current goals can now be found in the care plan section)      Frequency    Min 2X/week      PT Plan Current plan remains appropriate    Co-evaluation              AM-PAC PT "6 Clicks" Mobility   Outcome Measure  Help needed turning from your back to your side while in a flat bed without using bedrails?:  Total Help needed moving from lying on your back to sitting on the side of a flat bed without using bedrails?: Total Help needed moving to and from a bed to a chair (including a wheelchair)?: Total Help needed standing up from a chair using your arms (e.g., wheelchair or bedside chair)?: Total Help needed to walk in hospital room?: Total Help needed climbing 3-5 steps with a railing? : Total 6 Click Score: 6    End of Session Equipment Utilized During Treatment: Oxygen Activity Tolerance: Patient tolerated treatment well Patient left: in bed;with call bell/phone within reach;with nursing/sitter in room Nurse Communication: Mobility status PT Visit Diagnosis: Difficulty in walking, not elsewhere classified (R26.2)     Time: 8295-6213 PT Time Calculation (min) (ACUTE ONLY): 30 min  Charges:  $Therapeutic Activity: 23-37 mins                     [place]  Office [phone]    Claretha Cooper 11/07/2018, 12:00 PM

## 2018-11-07 NOTE — TOC Transition Note (Signed)
Transition of Care [facility]) - CM/SW Discharge Note   Patient Details  Name: Lori Petersen [facility] MRN: [id number] Date of Birth: February 16, 1955  [facility] CM/SW Contact:  Weston Anna, LCSW Phone Number: 11/07/2018, 12:48 PM   Clinical Narrative:     Patient set to discharge back to [facility] today- CSW notified patients sister, [name]. PTAR called for transportation. Report number provided for RN.   Final next level of care: Skilled Nursing Facility Barriers to Discharge: No Barriers Identified   Patient Goals and CMS Choice Patient states their goals for this hospitalization and ongoing recovery are:: Not oriented. Sister/guardian wants pt to return to [facility] CMS Medicare.gov Compare Post Acute Care list provided to:: Other (Comment Required)(plan for return to previous SNF) Choice offered to / list presented to : NA  Discharge Placement              Patient chooses bed at: [facility] [place] Patient to be transferred to facility by: [place] Name of family member notified: [name]- sister Patient and family notified of of transfer: 11/07/18  Discharge Plan and Services     Post Acute Care Choice: [place]          DME Arranged: N/A         HH Arranged: NA          Social Determinants of Health (SDOH) Interventions     Readmission Risk Interventions No flowsheet data found.

## 2018-11-07 NOTE — Progress Notes (Signed)
Pt left via ambulance by PTAR for discharge to [facility].  Pt cooperative at this time.  No s/s of distress.  Pt on 1 L/min [place].  Called facility back and notified them of catheter removal.

## 2018-11-07 NOTE — Progress Notes (Signed)
Foley catheter removed intact and tolerated well.

## 2018-11-07 NOTE — Progress Notes (Signed)
Report called to [facility].  Report given to Jarrett Ables, RN. Pt with foley catheter.  Instructed RN to monitor for urine output if catheter not present on arrival.  Verbalized understanding of need to monitor if foley removed.

## 2018-11-07 NOTE — Progress Notes (Signed)
VASCULAR LAB PRELIMINARY  PRELIMINARY  PRELIMINARY  PRELIMINARY  Bilateral lower extremity venous duplex completed.    Preliminary report:  See CV proc for preliminary results.  Sakoya Win, RVT 11/07/2018, 11:17 AM

## 2018-11-07 NOTE — Discharge Summary (Addendum)
Physician Discharge Summary  Tamella Tuccillo [facility] PRF:[id number] DOB: 1955/08/28 DOA: 11/03/2018  [facility]P: [facility]  Admit date: 11/03/2018 Discharge date: 11/07/2018  Admitted From: SNF   Disposition:  SNF   Recommendations for Outpatient Follow-up and new medication changes:  1. Follow up with primary care in 2 weeks.  2. Please stay quarantine for 2 weeks, maintain physical distancing and use mask in public.  3. Continue enoxaparin 50 mg sq q12 h, for DVT prophylaxis for the next 10 days.    Home Health: no   Equipment/Devices: no    Discharge Condition: stable  CODE STATUS: full  Diet recommendation: heart healthy   Brief/Interim Summary: 63 year old female who presented with dyspnea. She does have significant past medical history of Down syndrome, hypertension, dyslipidemia, arthritis, GERD, hypothyroidism and diastolic heart failure.  She lives at a nursing home in [place], where he was found hypoxic, she was diagnosed with SARS COVID-19 October 17.  She did complain of dyspnea on admission, her answers were limited due to cognitive dysfunction. On her initial physical examination her blood pressure was 67/71, pulse rate 96, temperature 98.1, respiratory rate 40, oxygen saturation 90%.  Her lungs had bilateral rales, heart S1-S2 present, rhythmic, abdomen soft, no lower extremity edema. Sodium 146, potassium 4.4, chloride 108, bicarb 27, glucose 138, BUN 40, creatinine 1.31, white count 9.3, hemoglobin 9.4, hematocrit 36.4, platelets 156.  Urinalysis negative for infection.  Chest radiograph had bilateral alveolar infiltrates, right lower lobe and left lower lobe, predominantly on the left.  EKG 110 bpm, normal axis, normal intervals, sinus rhythm, no ST segment T wave changes, positive artifact.  Patient was admitted to the hospital working diagnosis of SARS COVID-19 viral pneumonia complicated by acute hypoxic respiratory failure.  1.  Acute hypoxic respiratory  failure due to SARS COVID-19 viral pneumonia.  Patient was admitted to the medical ward, she received supplemental oxygen per nasal cannula, medical therapy with remdesivir and systemic steroids.  Her symptoms improved along with her oxygenation, at discharge her oximetry was 100% on 1 L of supplemental oxygen per nasal cannula.  Her inflammatory markers have improved except D-dimer which is 6.4, her fibrin 4,390 on admission.  Per protocol she received twice daily dosing of enoxaparin, before discharge she got ultrasonography of lower extremities, negative for DVT. She is not cooperative for CT chest.  Will plan to continue enoxaparin high dose prophylaxis for the next 10 days.   2.  Acute on chronic diastolic heart failure.  Initially patient received diuresis with good toleration, at discharge she seems to be euvolemic.  3.  Acute kidney injury with hypernatremia.  Patient received diuresis good toleration, her discharge creatinine is 1.32, sodium 145, bicarb 28.  4.  Elevated liver enzymes.  Clinically resolved at discharge.  5.  Obesity with dyslipidemia.  Patient will continue statin therapy, calculated BMI 35.5.  6.  GERD.  Continue proton pump inhibitors.  7.  Metabolic encephalopathy.  Patient was confused and disoriented while hospitalized, she required one-to-one sitter, and as needed Haldol.  8.  Down syndrome/dementia.  Continue memantine.  9.  Hypothyroid.  Continue levothyroxine.  Discharge Diagnoses:  Principal Problem:   Respiratory failure, acute (HCC)/ hypoxemic  Active Problems:   Mixed Alzheimer's and vascular dementia (HCC)   COVID-19 virus infection   Obesity, Class III, BMI 40-49.9 (morbid obesity) (HCC)   Chronic diastolic CHF (congestive heart failure) (HCC)   Acute metabolic encephalopathy   Hypothyroid    Discharge Instructions  Allergies as of 11/07/2018   No Known Allergies     Medication List    STOP taking these medications   aspirin EC 81  MG tablet   potassium chloride SA 20 MEQ tablet Commonly known as: KLOR-CON     TAKE these medications   albuterol 108 (90 Base) MCG/ACT inhaler Commonly known as: VENTOLIN HFA Inhale 1 puff into the lungs every 4 (four) hours as needed for wheezing or shortness of breath.   cholecalciferol 25 MCG (1000 UT) tablet Commonly known as: VITAMIN D3 Take 2,000 Units by mouth daily.   enoxaparin 60 MG/0.6ML injection Commonly known as: LOVENOX Inject 0.5 mLs (50 mg total) into the skin 2 (two) times daily for 10 days.   latanoprost 0.005 % ophthalmic solution Commonly known as: XALATAN Place 1 drop into the left eye at bedtime.   levothyroxine 88 MCG tablet Commonly known as: SYNTHROID Take 88 mcg by mouth daily before breakfast.   memantine 10 MG tablet Commonly known as: NAMENDA Take 10 mg by mouth 2 (two) times daily.   omeprazole 20 MG capsule Commonly known as: PRILOSEC Take 20 mg by mouth daily.   pravastatin 40 MG tablet Commonly known as: PRAVACHOL Take 40 mg by mouth daily.   senna 8.6 MG Tabs tablet Commonly known as: SENOKOT Take 2 tablets by mouth at bedtime.       Contact information for follow-up providers    [facility] Follow up in 2 week(s).   Specialty: General Practice Contact information: [address] [place] [address] [phone]            Contact information for after-discharge care    Destination    [facility] Preferred SNF .   Service: Skilled Nursing Contact information: [address] [place] [address] [phone]                 No Known Allergies  Consultations:     Procedures/Studies: Dg Chest Portable 1 View  Result Date: 11/02/2018 CLINICAL DATA:  Acute shortness of breath. COVID-19 positive. EXAM: PORTABLE CHEST 1 VIEW COMPARISON:  01/04/2018 FINDINGS: Cardiomegaly and new bilateral airspace opacities are noted. No definite  pleural effusion or pneumothorax. No acute bony abnormalities are present. IMPRESSION: Cardiomegaly with new bilateral airspace opacities - question edema and/or infection/pneumonia. Electronically Signed   By: Harmon Pier M.D.   On: 11/02/2018 17:06   [place] Ekg Site Rite  Result Date: 11/05/2018 If Site Rite image not attached, placement could not be confirmed due to current cardiac rhythm.     Procedures:   Subjective: Patient continue to improve, no signs of dyspnea, no nausea or vomiting, has been not agitated. Continue to be confused.   Discharge Exam: Vitals:   11/06/18 2000 11/07/18 0400  BP: 103/77 108/81  Pulse: 85 73  Resp: 18 18  Temp: 98 F (36.7 C) 99.1 F (37.3 C)  SpO2: 100% 100%   Vitals:   11/06/18 1503 11/06/18 1930 11/06/18 2000 11/07/18 0400  BP: 106/74  103/77 108/81  Pulse: 95 85 85 73  Resp: Temp: 98.6 F (37 C)  98 F (36.7 C) 99.1 F (37.3 C)  TempSrc: Axillary  Axillary Axillary  SpO2: 100%  100% 100%  Weight:      Height:        General: Not in pain or dyspnea.  Neurology: Awake and alert, non focal  E ENT: no pallor, no  icterus, oral mucosa moist Cardiovascular: No JVD. S1-S2 present, rhythmic, no gallops, rubs, or murmurs. Non pitting edema lower extremity edema. Pulmonary: positive breath sounds bilaterally, adequate air movement, no wheezing, rhonchi or rales. Gastrointestinal. Abdomen protuberant with no organomegaly, non tender, no rebound or guarding Skin. No rashes Musculoskeletal: no joint deformities   The results of significant diagnostics from this hospitalization (including imaging, microbiology, ancillary and laboratory) are listed below for reference.     Microbiology: Recent Results (from the past 240 hour(s))  SARS Coronavirus 2 by RT PCR (hospital order, performed in [facility] hospital lab) Nasopharyngeal Nasopharyngeal Swab     Status: Abnormal   Collection Time: 11/02/18  4:45 PM   Specimen:  Nasopharyngeal Swab  Result Value Ref Range Status   SARS Coronavirus 2 POSITIVE (A) NEGATIVE Final    Comment: RESULT CALLED TO, READ BACK BY AND VERIFIED WITH: SMITH,A AT 1813 ON 11/02/2018 BY JPM (NOTE) If result is NEGATIVE SARS-CoV-2 target nucleic acids are NOT DETECTED. The SARS-CoV-2 RNA is generally detectable in upper and lower  respiratory specimens during the acute phase of infection. The lowest  concentration of SARS-CoV-2 viral copies this assay can detect is 250  copies / mL. A negative result does not preclude SARS-CoV-2 infection  and should not be used as the sole basis for treatment or other  patient management decisions.  A negative result may occur with  improper specimen collection / handling, submission of specimen other  than nasopharyngeal swab, presence of viral mutation(s) within the  areas targeted by this assay, and inadequate number of viral copies  (<250 copies / mL). A negative result must be combined with clinical  observations, patient history, and epidemiological information. If result is POSITIVE SARS-CoV-2 target nucleic acids are DETECTED.  The SARS-CoV-2 RNA is generally detectable in upper and lower  respiratory specimens during the acute phase of infection.  Positive  results are indicative of active infection with SARS-CoV-2.  Clinical  correlation with patient history and other diagnostic information is  necessary to determine patient infection status.  Positive results do  not rule out bacterial infection or co-infection with other viruses. If result is PRESUMPTIVE POSTIVE SARS-CoV-2 nucleic acids MAY BE PRESENT.   A presumptive positive result was obtained on the submitted specimen  and confirmed on repeat testing.  While 2019 novel coronavirus  (SARS-CoV-2) nucleic acids may be present in the submitted sample  additional confirmatory testing may be necessary for epidemiological  and / or clinical management purposes  to differentiate  between  SARS-CoV-2 and other Sarbecovirus currently known to infect humans.  If clinically indicated additional testing with an alternate test  methodology [phone]) i s advised. The SARS-CoV-2 RNA is generally  detectable in upper and lower respiratory specimens during the acute  phase of infection. The expected result is Negative. Fact Sheet for Patients:  BoilerBrush.com.cy Fact Sheet for Healthcare Providers: https://pope.com/ This test is not yet approved or cleared by the [place] FDA and has been authorized for detection and/or diagnosis of SARS-CoV-2 by FDA under an Emergency Use Authorization (EUA).  This EUA will remain in effect (meaning this test can be used) for the duration of the COVID-19 declaration under Section 564(b)(1) of the Act, 21 U.S.C. section 360bbb-3(b)(1), unless the authorization is terminated or revoked sooner. Performed at [facility], [address], [place], [place] [address]   MRSA PCR Screening     Status: None   Collection Time: 11/03/18  6:24 AM   Specimen:  Nasal Mucosa; Nasopharyngeal  Result Value Ref Range Status   MRSA by PCR NEGATIVE NEGATIVE Final    Comment:        The GeneXpert MRSA Assay (FDA approved for NASAL specimens only), is one component of a comprehensive MRSA colonization surveillance program. It is not intended to diagnose MRSA infection nor to guide or monitor treatment for MRSA infections. Performed at [facility], [address] [place], [place] [address]   CULTURE, BLOOD (ROUTINE X 2) w Reflex to ID Panel     Status: None (Preliminary result)   Collection Time: 11/03/18 12:44 PM   Specimen: BLOOD  Result Value Ref Range Status   Specimen Description BLOOD L HAND  Final   Special Requests   Final    BOTTLES DRAWN AEROBIC AND ANAEROBIC Blood Culture adequate volume   Culture   Final    NO GROWTH 4 DAYS Performed at [facility],  [address] [place], [place] [address]    Report Status PENDING  Incomplete     Labs: BNP (last 3 results) Recent Labs    11/04/18 0240 11/05/18 0215 11/06/18 1015  BNP 91.4 73.8 60.3   Basic Metabolic Panel: Recent Labs  Lab 11/03/18 0559 11/04/18 0240 11/05/18 0215 11/06/18 1015 11/07/18 0450  NA 146* 148* 153* 144 145  K 4.4 4.9 4.9 4.2 4.2  CL 108 108 112* 105 106  CO2 27 29 29 28 28   GLUCOSE 138* 151* 177* 331* 244*  BUN 40* 40* 40* 40* 46*  CREATININE 1.31* 1.20* 1.20* 1.32* 1.32*  CALCIUM 7.5* 7.8* 7.8* 7.6* 7.3*  MG 2.5* 2.4 2.6* 2.5*  --   PHOS 3.4  --   --   --   --    Liver Function Tests: Recent Labs  Lab 11/03/18 0559 11/04/18 0240 11/05/18 0215 11/06/18 1015 11/07/18 0450  AST 55* 50* 43* 39 28  ALT 31 28 25 24 21   ALKPHOS 36* 37* 37* 44 41  BILITOT 0.6 0.5 0.5 0.8 0.8  PROT 6.7 6.5 6.4* 6.3* 5.6*  ALBUMIN 2.6* 2.5* 2.3* 2.3* 2.1*   No results for input(s): LIPASE, AMYLASE in the last 168 hours. No results for input(s): AMMONIA in the last 168 hours. CBC: Recent Labs  Lab 11/03/18 0559 11/04/18 0240 11/05/18 0215 11/06/18 1015 11/07/18 0450  WBC 9.3 7.5 5.9 12.4* 12.0*  NEUTROABS 7.7 5.9 4.4 9.3* 9.4*  HGB 11.4* 11.7* 12.8 14.3 12.8  HCT 36.4 38.3 42.7 46.6* 42.2  MCV 101.7* 104.4* 104.7* 103.8* 104.2*  PLT 156 193 199 244 227   Cardiac Enzymes: No results for input(s): CKTOTAL, CKMB, CKMBINDEX, TROPONINI in the last 168 hours. BNP: Invalid input(s): POCBNP CBG: No results for input(s): GLUCAP in the last 168 hours. D-Dimer Recent Labs    11/06/18 1015 11/07/18 0450  DDIMER 5.41* 6.44*   Hgb A1c No results for input(s): HGBA1C in the last 72 hours. Lipid Profile No results for input(s): CHOL, HDL, LDLCALC, TRIG, CHOLHDL, LDLDIRECT in the last 72 hours. Thyroid function studies No results for input(s): TSH, T4TOTAL, T3FREE, THYROIDAB in the last 72 hours.  Invalid input(s): FREET3 Anemia work up Recent Labs     11/07/18 0450  FERRITIN 946*   Urinalysis    Component Value Date/Time   COLORURINE AMBER (A) 11/03/2018 1138   APPEARANCEUR TURBID (A) 11/03/2018 1138   APPEARANCEUR CLEAR 02/22/2013 2300   LABSPEC 1.026 11/03/2018 1138   LABSPEC 1.013 02/22/2013 2300   PHURINE 5.0 11/03/2018 1138  GLUCOSEU NEGATIVE 11/03/2018 1138   GLUCOSEU NEGATIVE 02/22/2013 2300   HGBUR SMALL (A) 11/03/2018 1138   BILIRUBINUR NEGATIVE 11/03/2018 1138   BILIRUBINUR NEGATIVE 02/22/2013 2300   KETONESUR NEGATIVE 11/03/2018 1138   PROTEINUR 100 (A) 11/03/2018 1138   NITRITE NEGATIVE 11/03/2018 1138   LEUKOCYTESUR NEGATIVE 11/03/2018 1138   LEUKOCYTESUR NEGATIVE 02/22/2013 2300   Sepsis Labs Invalid input(s): PROCALCITONIN,  WBC,  LACTICIDVEN Microbiology Recent Results (from the past 240 hour(s))  SARS Coronavirus 2 by RT PCR (hospital order, performed in [facility] hospital lab) Nasopharyngeal Nasopharyngeal Swab     Status: Abnormal   Collection Time: 11/02/18  4:45 PM   Specimen: Nasopharyngeal Swab  Result Value Ref Range Status   SARS Coronavirus 2 POSITIVE (A) NEGATIVE Final    Comment: RESULT CALLED TO, READ BACK BY AND VERIFIED WITH: SMITH,A AT 1813 ON 11/02/2018 BY JPM (NOTE) If result is NEGATIVE SARS-CoV-2 target nucleic acids are NOT DETECTED. The SARS-CoV-2 RNA is generally detectable in upper and lower  respiratory specimens during the acute phase of infection. The lowest  concentration of SARS-CoV-2 viral copies this assay can detect is 250  copies / mL. A negative result does not preclude SARS-CoV-2 infection  and should not be used as the sole basis for treatment or other  patient management decisions.  A negative result may occur with  improper specimen collection / handling, submission of specimen other  than nasopharyngeal swab, presence of viral mutation(s) within the  areas targeted by this assay, and inadequate number of viral copies  (<250 copies / mL). A negative result must  be combined with clinical  observations, patient history, and epidemiological information. If result is POSITIVE SARS-CoV-2 target nucleic acids are DETECTED.  The SARS-CoV-2 RNA is generally detectable in upper and lower  respiratory specimens during the acute phase of infection.  Positive  results are indicative of active infection with SARS-CoV-2.  Clinical  correlation with patient history and other diagnostic information is  necessary to determine patient infection status.  Positive results do  not rule out bacterial infection or co-infection with other viruses. If result is PRESUMPTIVE POSTIVE SARS-CoV-2 nucleic acids MAY BE PRESENT.   A presumptive positive result was obtained on the submitted specimen  and confirmed on repeat testing.  While 2019 novel coronavirus  (SARS-CoV-2) nucleic acids may be present in the submitted sample  additional confirmatory testing may be necessary for epidemiological  and / or clinical management purposes  to differentiate between  SARS-CoV-2 and other Sarbecovirus currently known to infect humans.  If clinically indicated additional testing with an alternate test  methodology [phone] i s advised. The SARS-CoV-2 RNA is generally  detectable in upper and lower respiratory specimens during the acute  phase of infection. The expected result is Negative. Fact Sheet for Patients:  BoilerBrush.com.cy Fact Sheet for Healthcare Providers: https://pope.com/ This test is not yet approved or cleared by the [place] FDA and has been authorized for detection and/or diagnosis of SARS-CoV-2 by FDA under an Emergency Use Authorization (EUA).  This EUA will remain in effect (meaning this test can be used) for the duration of the COVID-19 declaration under Section 564(b)(1) of the Act, 21 U.S.C. section 360bbb-3(b)(1), unless the authorization is terminated or revoked sooner. Performed at [facility], [address] [place], [place] [address]   MRSA PCR Screening     Status: None   Collection Time: 11/03/18  6:24 AM   Specimen: Nasal Mucosa; Nasopharyngeal  Result Value Ref  Range Status   MRSA by PCR NEGATIVE NEGATIVE Final    Comment:        The GeneXpert MRSA Assay (FDA approved for NASAL specimens only), is one component of a comprehensive MRSA colonization surveillance program. It is not intended to diagnose MRSA infection nor to guide or monitor treatment for MRSA infections. Performed at [facility], [address] [place], [place] [address]   CULTURE, BLOOD (ROUTINE X 2) w Reflex to ID Panel     Status: None (Preliminary result)   Collection Time: 11/03/18 12:44 PM   Specimen: BLOOD  Result Value Ref Range Status   Specimen Description BLOOD L HAND  Final   Special Requests   Final    BOTTLES DRAWN AEROBIC AND ANAEROBIC Blood Culture adequate volume   Culture   Final    NO GROWTH 4 DAYS Performed at [facility], [address], [place], [place] [address]    Report Status PENDING  Incomplete     Time coordinating discharge: 45 minutes  SIGNED:   Tawni Millers, MD  [facility] 11/07/2018, 8:21 AM

## 2018-11-08 LAB — CULTURE, BLOOD (ROUTINE X 2)
Culture: NO GROWTH
Special Requests: ADEQUATE

## 2018-12-03 ENCOUNTER — Encounter: Payer: Self-pay | Admitting: Internal Medicine

## 2018-12-03 ENCOUNTER — Emergency Department: Payer: Medicare (Managed Care)

## 2018-12-03 ENCOUNTER — Other Ambulatory Visit: Payer: Self-pay

## 2018-12-03 ENCOUNTER — Observation Stay: Payer: Medicare (Managed Care)

## 2018-12-03 ENCOUNTER — Observation Stay
Admission: EM | Admit: 2018-12-03 | Discharge: 2018-12-05 | Disposition: A | Discharge status: Skilled Nursing Facility | Payer: Medicare (Managed Care) | Attending: Internal Medicine | Admitting: Internal Medicine

## 2018-12-03 DIAGNOSIS — Z8619 Personal history of other infectious and parasitic diseases: Secondary | ICD-10-CM | POA: Diagnosis not present

## 2018-12-03 DIAGNOSIS — J45909 Unspecified asthma, uncomplicated: Secondary | ICD-10-CM | POA: Insufficient documentation

## 2018-12-03 DIAGNOSIS — G309 Alzheimer's disease, unspecified: Secondary | ICD-10-CM | POA: Insufficient documentation

## 2018-12-03 DIAGNOSIS — Q909 Down syndrome, unspecified: Secondary | ICD-10-CM | POA: Diagnosis not present

## 2018-12-03 DIAGNOSIS — Z7901 Long term (current) use of anticoagulants: Secondary | ICD-10-CM | POA: Insufficient documentation

## 2018-12-03 DIAGNOSIS — E039 Hypothyroidism, unspecified: Secondary | ICD-10-CM | POA: Diagnosis present

## 2018-12-03 DIAGNOSIS — Z79899 Other long term (current) drug therapy: Secondary | ICD-10-CM | POA: Diagnosis not present

## 2018-12-03 DIAGNOSIS — Z79890 Hormone replacement therapy (postmenopausal): Secondary | ICD-10-CM | POA: Insufficient documentation

## 2018-12-03 DIAGNOSIS — F0280 Dementia in other diseases classified elsewhere without behavioral disturbance: Secondary | ICD-10-CM | POA: Insufficient documentation

## 2018-12-03 DIAGNOSIS — I5032 Chronic diastolic (congestive) heart failure: Secondary | ICD-10-CM | POA: Diagnosis present

## 2018-12-03 DIAGNOSIS — K219 Gastro-esophageal reflux disease without esophagitis: Secondary | ICD-10-CM | POA: Diagnosis not present

## 2018-12-03 DIAGNOSIS — R569 Unspecified convulsions: Secondary | ICD-10-CM | POA: Diagnosis not present

## 2018-12-03 DIAGNOSIS — U071 COVID-19: Secondary | ICD-10-CM | POA: Diagnosis present

## 2018-12-03 LAB — COMPREHENSIVE METABOLIC PANEL
ALT: 21 U/L (ref 0–44)
AST: 20 U/L (ref 15–41)
Albumin: 3.5 g/dL (ref 3.5–5.0)
Alkaline Phosphatase: 34 U/L — ABNORMAL LOW (ref 38–126)
Anion gap: 7 (ref 5–15)
BUN: 11 mg/dL (ref 8–23)
CO2: 28 mmol/L (ref 22–32)
Calcium: 8.8 mg/dL — ABNORMAL LOW (ref 8.9–10.3)
Chloride: 108 mmol/L (ref 98–111)
Creatinine, Ser: 1.16 mg/dL — ABNORMAL HIGH (ref 0.44–1.00)
GFR calc Af Amer: 58 mL/min — ABNORMAL LOW (ref >60)
GFR calc non Af Amer: 50 mL/min — ABNORMAL LOW (ref >60)
Glucose, Bld: 105 mg/dL — ABNORMAL HIGH (ref 70–99)
Potassium: 3.8 mmol/L (ref 3.5–5.1)
Sodium: 143 mmol/L (ref 135–145)
Total Bilirubin: 0.8 mg/dL (ref 0.3–1.2)
Total Protein: 6.6 g/dL (ref 6.5–8.1)

## 2018-12-03 LAB — URINALYSIS, ROUTINE W REFLEX MICROSCOPIC
Bacteria, UA: NONE SEEN
Bilirubin Urine: NEGATIVE
Glucose, UA: NEGATIVE mg/dL
Hgb urine dipstick: NEGATIVE
Ketones, ur: NEGATIVE mg/dL
Leukocytes,Ua: NEGATIVE
Nitrite: NEGATIVE
Protein, ur: NEGATIVE mg/dL
Specific Gravity, Urine: 1.008 (ref 1.005–1.030)
pH: 8 (ref 5.0–8.0)

## 2018-12-03 LAB — VITAMIN B12: Vitamin B-12: 574 pg/mL (ref 180–914)

## 2018-12-03 LAB — CBC WITH DIFFERENTIAL/PLATELET
Abs Immature Granulocytes: 0.13 10*3/uL — ABNORMAL HIGH (ref 0.00–0.07)
Basophils Absolute: 0.1 10*3/uL (ref 0.0–0.1)
Basophils Relative: 2 %
Eosinophils Absolute: 0 10*3/uL (ref 0.0–0.5)
Eosinophils Relative: 0 %
HCT: 40.4 % (ref 36.0–46.0)
Hemoglobin: 13.3 g/dL (ref 12.0–15.0)
Immature Granulocytes: 3 %
Lymphocytes Relative: 14 %
Lymphs Abs: 0.7 10*3/uL (ref 0.7–4.0)
MCH: 32.8 pg (ref 26.0–34.0)
MCHC: 32.9 g/dL (ref 30.0–36.0)
MCV: 99.8 fL (ref 80.0–100.0)
Monocytes Absolute: 0.3 10*3/uL (ref 0.1–1.0)
Monocytes Relative: 6 %
Neutro Abs: 4 10*3/uL (ref 1.7–7.7)
Neutrophils Relative %: 75 %
Platelets: 159 10*3/uL (ref 150–400)
RBC: 4.05 MIL/uL (ref 3.87–5.11)
RDW: 16 % — ABNORMAL HIGH (ref 11.5–15.5)
WBC: 5.2 10*3/uL (ref 4.0–10.5)
nRBC: 0 % (ref 0.0–0.2)

## 2018-12-03 LAB — TYPE AND SCREEN
ABO/RH(D): A POS
Antibody Screen: NEGATIVE

## 2018-12-03 LAB — T4, FREE: Free T4: 1.4 ng/dL — ABNORMAL HIGH (ref 0.61–1.12)

## 2018-12-03 LAB — PHOSPHORUS: Phosphorus: 3.4 mg/dL (ref 2.5–4.6)

## 2018-12-03 LAB — MAGNESIUM: Magnesium: 2 mg/dL (ref 1.7–2.4)

## 2018-12-03 LAB — TSH: TSH: 2.668 u[IU]/mL (ref 0.350–4.500)

## 2018-12-03 MED ORDER — SODIUM CHLORIDE 0.9 % IV SOLN: 75.0000 mL/h | INTRAVENOUS | Status: DC

## 2018-12-03 MED ORDER — LACTATED RINGERS IV SOLN
INTRAVENOUS | Status: DC
  Administered 2018-12-03 – 2018-12-04 (×2): via INTRAVENOUS

## 2018-12-03 NOTE — ED Triage Notes (Signed)
Pt comes from [facility] with a seizure lasting about 5 to 7 mins. Pt never had one before. Hx of Down Syndrome. Pt AOx4 for EMS after seizure. Pt not answering all of my questions but able to tell me her name.

## 2018-12-03 NOTE — ED Notes (Addendum)
Received callback from pt's sister Jilda Roche (legal guardian). Phone #s updated. Update given regarding pt condition and plan of care.

## 2018-12-03 NOTE — ED Provider Notes (Signed)
[facility] Emergency Department Provider Note  ____________________________________________   First MD Initiated Contact with Patient 12/03/18 1011     (approximate)  I have reviewed the triage vital signs and the nursing notes.   HISTORY  Chief Complaint Seizures    HPI Lori Petersen is a 63 y.o. female with Down syndrome, coronavirus +46-month ago who presents with seizure.  Patient reportedly had a 5 to 7-minute generalized tonic-clonic seizure just prior to arrival that was constant, better after getting a IV established but not needing medications, stopped on its own, occurred 1 time.  Appeared postictal afterwards.  Did have bowel incontinence.  Unclear her baseline due to her Down syndrome.     Past Medical History:  Diagnosis Date  . Arthritis    lower spine  . Asthma   . Diastolic congestive heart failure ([place])    "only has problems with bad colds"  . Down's syndrome   . Full dentures    does not wear  . GERD (gastroesophageal reflux disease)   . Hypothyroidism   . Onychomycosis     Patient Active Problem List   Diagnosis Date Noted  . Obesity, Class III, BMI 40-49.9 (morbid obesity) ([place]) 11/06/2018  . Chronic diastolic CHF (congestive heart failure) ([place]) 11/06/2018  . Respiratory failure, acute (HCC)/ hypoxemic  11/06/2018  . Acute metabolic encephalopathy 94/85/4627  . Hypothyroid 11/06/2018  . COVID-19 virus infection 11/03/2018  . COVID-19   . Acute respiratory failure with hypoxia ([place]) 11/02/2018  . Left tibial fracture 04/27/2017  . Right tibial fracture 04/27/2017  . Problems with swallowing and mastication   . Blood in stool   . Bradycardia 05/14/2015  . Mixed Alzheimer's and vascular dementia ([place]) 09/14/2014  . Onychomycosis     Past Surgical History:  Procedure Laterality Date  . BREAST BIOPSY Left 04/05/2016   neg. cylinder clip  . BREAST BIOPSY Left 12/06/2016   ribbon clip. path pending  . COLONOSCOPY  WITH PROPOFOL N/A 04/24/2016   Procedure: COLONOSCOPY WITH PROPOFOL;  Surgeon: Lucilla Lame, MD;  Location: [facility];  Service: Endoscopy;  Laterality: N/A;  Leave patient at 10:25 due to transportation  . ESOPHAGOGASTRODUODENOSCOPY (EGD) WITH PROPOFOL N/A 04/24/2016   Procedure: ESOPHAGOGASTRODUODENOSCOPY (EGD) WITH PROPOFOL;  Surgeon: Lucilla Lame, MD;  Location: [facility];  Service: Endoscopy;  Laterality: N/A;  . HERNIA REPAIR      Prior to Admission medications   Medication Sig Start Date End Date Taking? Authorizing Provider  albuterol (VENTOLIN HFA) 108 (90 Base) MCG/ACT inhaler Inhale 1 puff into the lungs every 4 (four) hours as needed for wheezing or shortness of breath.    [provider]  cholecalciferol (VITAMIN D3) 25 MCG (1000 UT) tablet Take 2,000 Units by mouth daily.    [provider]  enoxaparin (LOVENOX) 60 MG/0.6ML injection Inject 0.5 mLs (50 mg total) into the skin 2 (two) times daily for 10 days. 11/07/18 11/17/18  Arrien, Jimmy Picket, MD  latanoprost (XALATAN) 0.005 % ophthalmic solution Place 1 drop into the left eye at bedtime.    [provider]  levothyroxine (SYNTHROID) 88 MCG tablet Take 88 mcg by mouth daily before breakfast.    [provider]  memantine (NAMENDA) 10 MG tablet Take 10 mg by mouth 2 (two) times daily.    [provider]  omeprazole (PRILOSEC) 20 MG capsule Take 20 mg by mouth daily.    [provider]  pravastatin (PRAVACHOL) 40 MG tablet Take 40  mg by mouth daily.    [provider]  senna (SENOKOT) 8.6 MG TABS tablet Take 2 tablets by mouth at bedtime.    [provider]    Allergies Patient has no known allergies.  No family history on file.  Social History Social History   Tobacco Use  . Smoking status: Never Smoker  . Smokeless tobacco: Never Used  Substance Use Topics  . Alcohol use: No  . Drug use: Not Currently      Review of Systems  Unable to get full review of system due to patient being postictal ____________________________________________   PHYSICAL EXAM:  VITAL SIGNS: ED Triage Vitals [12/03/18 1013]  Enc Vitals Group     BP (!) 156/101     Pulse Rate 87     Resp 18     Temp 98.4 F (36.9 C)     Temp Source Oral     SpO2 95 %     Weight 218 lb 4.1 oz (99 kg)     Height 5\' 8"  (1.727 m)     Head Circumference      Peak Flow      Pain Score 0     Pain Loc      Pain Edu?      Excl. in GC?     Constitutional: Alert and oriented. Well appearing and in no acute distress. Eyes: Conjunctivae are normal. EOMI. Head: Atraumatic. Nose: No congestion/rhinnorhea. Mouth/Throat: Mucous membranes are moist.   Neck: No stridor. Trachea Midline. FROM Cardiovascular: Normal rate, regular rhythm. Grossly normal heart sounds.  Good peripheral circulation. Respiratory: Normal respiratory effort.  No retractions. Lungs CTAB. Gastrointestinal: Soft and nontender. No distention. No abdominal bruits.  Musculoskeletal:   No joint effusions.  Fracture of the left leg.  Some mild edema bilaterally. Neurologic: Follows simple commands like weakly squeezes her bilateral hands and looking around the room but no obvious facial droop. Skin:  Skin is warm, dry and intact. No rash noted. Psychiatric: Mood and affect are normal. Speech and behavior are normal. GU: Deferred   ____________________________________________   LABS (all labs ordered are listed, but only abnormal results are displayed)  Labs Reviewed  CBC WITH DIFFERENTIAL/PLATELET - Abnormal; Notable for the following components:      Result Value   RDW 16.0 (*)    Abs Immature Granulocytes 0.13 (*)    All other components within normal limits  COMPREHENSIVE METABOLIC PANEL - Abnormal; Notable for the following components:   Glucose, Bld 105 (*)    Creatinine, Ser 1.16 (*)    Calcium 8.8 (*)    Alkaline Phosphatase 34 (*)    GFR calc non Af Amer 50 (*)     GFR calc Af Amer 58 (*)    All other components within normal limits  URINALYSIS, ROUTINE W REFLEX MICROSCOPIC - Abnormal; Notable for the following components:   Color, Urine STRAW (*)    APPearance CLEAR (*)    All other components within normal limits  MAGNESIUM  PHOSPHORUS   ____________________________________________   ED ECG REPORT I, , the attending physician, personally viewed and interpreted this ECG.  EKG is normal sinus rate of 88, no ST elevation, no T wave inversions, normal intervals. ____________________________________________  RADIOLOGY  Official radiology report(s): Ct Head Wo Contrast  Result Date: 12/03/2018 CLINICAL DATA:  Altered level of consciousness. New onset seizure. History of COVID-19 last month EXAM: CT HEAD WITHOUT CONTRAST TECHNIQUE: Contiguous axial images were obtained from the  base of the skull through the vertex without intravenous contrast. COMPARISON:  CT head 02/22/2013 FINDINGS: Brain: Motion degraded study. Progressive atrophy. Progressive white matter disease since 2015. Diffuse white matter hypodensity most consistent with microvascular ischemia. Negative for acute cortical infarct. Negative for hemorrhage or mass. Vascular: Negative for hyperdense vessel Skull: Negative Sinuses/Orbits: Negative Other: None IMPRESSION: No acute intracranial abnormality Motion degraded study Progression of atrophy and chronic microvascular ischemia since 2015 Electronically Signed   By: Marlan Palauharles  Clark M.D.   On: 12/03/2018 11:10    ____________________________________________   PROCEDURES  Procedure(s) performed (including Critical Care):  Procedures   ____________________________________________   INITIAL IMPRESSION / ASSESSMENT AND PLAN / ED COURSE  Lori Petersen was evaluated in Emergency Department on 12/03/2018 for the symptoms described in the history of present illness. She was evaluated in the context of the global COVID-19  pandemic, which necessitated consideration that the patient might be at risk for infection with the SARS-CoV-2 virus that causes COVID-19. Institutional protocols and algorithms that pertain to the evaluation of patients at risk for COVID-19 are in a state of rapid change based on information released by regulatory bodies including the CDC and federal and state organizations. These policies and algorithms were followed during the patient's care in the ED.    Patient presents with first-time seizure.  Sound was likely like syncope but will get EKG to evaluate.  Will get labs to evaluate for electrolyte abnormalities, AKI, UTI.  Will get CT head to evaluate for intracranial hemorrhage.  No obvious neuro deficit on exam but given her age could be stroke or tumor not seen on CT scan.  Labs are reassuring.  EKG no evidence of arrhythmia.  CT head was negative for intracranial hemorrhage or obvious mass.  D/w Dr. Thad Rangereynolds from neurology who recommend MRI and EEG  Discussed the hospital team for admission.  ____________________________________________   FINAL CLINICAL IMPRESSION(S) / ED DIAGNOSES   Final diagnoses:  Seizure (HCC)      MEDICATIONS GIVEN DURING THIS VISIT:  Medications - No data to display   ED Discharge Orders    None       Note:  This document was prepared using Dragon voice recognition software and may include unintentional dictation errors.   Concha SeFunke, Riham E, MD 12/03/18 [phone]

## 2018-12-03 NOTE — Consult Note (Signed)
MEDICATION RELATED CONSULT NOTE - INITIAL   Pharmacy Consult for: Review Meds for Drug Interactions Indication: Drug interaction and monitoring of  medications  No Known Allergies  Patient Measurements: Height: 5\' 8"  (172.7 cm) Weight: 218 lb 4.1 oz (99 kg) IBW/kg (Calculated) : 63.9 Adjusted Body Weight: 99 kg  Vital Signs: Temp: 98.4 F (36.9 C) (11/17 1013) Temp Source: Oral (11/17 1013) BP: 132/95 (11/17 1130) Pulse Rate: 67 (11/17 1130) Intake/Output from previous day: No intake/output data recorded. Intake/Output from this shift: No intake/output data recorded.  Labs: Recent Labs    12/03/18 1049  WBC 5.2  HGB 13.3  HCT 40.4  PLT 159  CREATININE 1.16*  MG 2.0  PHOS 3.4  ALBUMIN 3.5  PROT 6.6  AST 20  ALT 21  ALKPHOS 34*  BILITOT 0.8   Estimated Creatinine Clearance: 61.8 mL/min (A) (by C-G formula based on SCr of 1.16 mg/dL (H)).   Microbiology: No results found for this or any previous visit (from the past 720 hour(s)).  Medical History: Past Medical History:  Diagnosis Date  . Arthritis    lower spine  . Asthma   . Diastolic congestive heart failure ([place])    "only has problems with bad colds"  . Down's syndrome   . Full dentures    does not wear  . GERD (gastroesophageal reflux disease)   . Hypothyroidism   . Onychomycosis     Medications:  -LR @ 75 mL/hr  - NS 75 mL/hr   Assessment: Pharmacy has been consulted for monitoring drug interaction of antiepileptic medications. Presently, there are no active scheduled or PRN medication orders.   Plan:  Pharmacy will continue to monitor for drugs interactions with antiepileptics.   Rowland Lathe 12/03/2018,1:09 PM

## 2018-12-03 NOTE — H&P (Signed)
History and Physical    Lori Petersen [facility] OHY:[id number] DOB: 09-Sep-1955 DOA: 12/03/2018  [facility]P: [facility] (Confirm with patient/family/NH records and if not entered, this has to be entered at [facility] point of entry) Patient coming from: Skilled nursing facility  I have personally briefly reviewed patient's old medical records in [facility] Link  Chief Complaint: Seizures  HPI: Lori Petersen is a 63 y.o. female with medical history significant of CHF, Downs syndrome and seen in ed for seizures that occurred today  And pt had bowel incontinence.History is from ems and EDMD note.     ED Course:  Blood pressure (!) 144/98, pulse 73, temperature 98.4 F (36.9 C), temperature source Oral, resp. rate 11, height 5\' 8"  (1.727 m), weight 99 kg, SpO2 100 %. Usual CBC shows a hemoglobin of 13.3 with a hematocrit of 40.4, RDW of 16.0, platelets of 159.  CMP shows sodium of 143, potassium of 3.8, blood glucose 105, creatinine of 1.16 with an estimated GFR 58., normal LFTs. Initial head CT with negative no acute intracranial abnormality, motion degraded study.    Review of Systems: As per HPI otherwise 10 point review of systems negative.    Past Medical History:  Diagnosis Date   Arthritis    lower spine   Asthma    Diastolic congestive heart failure (HCC)    "only has problems with bad colds"   Down's syndrome    Full dentures    does not wear   GERD (gastroesophageal reflux disease)    Hypothyroidism    Onychomycosis     Past Surgical History:  Procedure Laterality Date   BREAST BIOPSY Left 04/05/2016   neg. cylinder clip   BREAST BIOPSY Left 12/06/2016   ribbon clip. path pending   COLONOSCOPY WITH PROPOFOL N/A 04/24/2016   Procedure: COLONOSCOPY WITH PROPOFOL;  Surgeon: 06/24/2016, MD;  Location: [facility];  Service: Endoscopy;  Laterality: N/A;  Leave patient at 10:25 due to transportation   ESOPHAGOGASTRODUODENOSCOPY (EGD) WITH PROPOFOL  N/A 04/24/2016   Procedure: ESOPHAGOGASTRODUODENOSCOPY (EGD) WITH PROPOFOL;  Surgeon: 06/24/2016, MD;  Location[facility];  Service: Endoscopy;  Laterality: N/A;   HERNIA REPAIR       reports that she has never smoked. She has never used smokeless tobacco. She reports previous drug use. She reports that she does not drink alcohol.  No Known Allergies  History reviewed. No pertinent family history.   Prior to Admission medications   Medication Sig Start Date End Date Taking? Authorizing Provider  albuterol (VENTOLIN HFA) 108 (90 Base) MCG/ACT inhaler Inhale 1 puff into the lungs every 4 (four) hours as needed for wheezing or shortness of breath.    [provider]  cholecalciferol (VITAMIN D3) 25 MCG (1000 UT) tablet Take 2,000 Units by mouth daily.    [provider]  enoxaparin (LOVENOX) 60 MG/0.6ML injection Inject 0.5 mLs (50 mg total) into the skin 2 (two) times daily for 10 days. 11/07/18 11/17/18  Arrien, 13/1/20, MD  latanoprost (XALATAN) 0.005 % ophthalmic solution Place 1 drop into the left eye at bedtime.    [provider]  levothyroxine (SYNTHROID) 88 MCG tablet Take 88 mcg by mouth daily before breakfast.    [provider]  memantine (NAMENDA) 10 MG tablet Take 10 mg by mouth 2 (two) times daily.    [provider]  omeprazole (PRILOSEC) 20 MG capsule Take 20 mg by mouth daily.    [provider]  pravastatin (PRAVACHOL) 40 MG tablet Take 40 mg by mouth daily.    [provider]  senna (SENOKOT) 8.6 MG TABS tablet Take 2 tablets by mouth at bedtime.    [provider]    Physical Exam: Vitals:   12/03/18 1400 12/03/18 1500 12/03/18 1530 12/03/18 1600  BP: 139/90 (!) 142/98 (!) 106/55 (!) 144/98  Pulse: 94 87  73  Resp: 17 15 15 11   Temp:      TempSrc:      SpO2: 100% 99%  100%  Weight:      Height:        Constitutional: NAD, calm, comfortable Vitals:   12/03/18 1400  12/03/18 1500 12/03/18 1530 12/03/18 1600  BP: 139/90 (!) 142/98 (!) 106/55 (!) 144/98  Pulse: 94 87  73  Resp: 17 15 15 11   Temp:      TempSrc:      SpO2: 100% 99%  100%  Weight:      Height:       Eyes: PERRL,EOMI  lids and conjunctivae normal ENMT: Mucous membranes are moist. Posterior pharynx clear of any exudate or lesions.Normal dentition.  Neck: normal, supple, no masses, no thyromegaly Respiratory: clear to auscultation bilaterally, no wheezing, no crackles. Normal respiratory effort. No accessory muscle use.  Cardiovascular: Regular rate and rhythm, no murmurs / rubs / gallops. No extremity edema. 2+ pedal pulses. No carotid bruits.  Abdomen: no tenderness, no masses palpated. No hepatosplenomegaly. Bowel sounds positive.  Musculoskeletal: no clubbing / cyanosis.  Skin: no rashes, lesions, ulcers. No induration Neurologic: CN 2-12 grossly intact. Sensation intact, DTR normal. Strength 5/5 in all 4.  Psychiatric: Normal judgment and insight. Alert and oriented x 3. Normal mood.   Labs on Admission: I have personally reviewed following labs and imaging studies  CBC: Recent Labs  Lab 12/03/18 1049  WBC 5.2  NEUTROABS 4.0  HGB 13.3  HCT 40.4  MCV 99.8  PLT 240   Basic Metabolic Panel: Recent Labs  Lab 12/03/18 1049  NA 143  K 3.8  CL 108  CO2 28  GLUCOSE 105*  BUN 11  CREATININE 1.16*  CALCIUM 8.8*  MG 2.0  PHOS 3.4   GFR: Estimated Creatinine Clearance: 61.8 mL/min (A) (by C-G formula based on SCr of 1.16 mg/dL (H)). Liver Function Tests: Recent Labs  Lab 12/03/18 1049  AST 20  ALT 21  ALKPHOS 34*  BILITOT 0.8  PROT 6.6  ALBUMIN 3.5   No results for input(s): LIPASE, AMYLASE in the last 168 hours. No results for input(s): AMMONIA in the last 168 hours. Coagulation Profile: No results for input(s): INR, PROTIME in the last 168 hours. Cardiac Enzymes: No results for input(s): CKTOTAL, CKMB, CKMBINDEX, TROPONINI in the last 168 hours. BNP (last 3  results) No results for input(s): PROBNP in the last 8760 hours. HbA1C: No results for input(s): HGBA1C in the last 72 hours. CBG: No results for input(s): GLUCAP in the last 168 hours. Lipid Profile: No results for input(s): CHOL, HDL, LDLCALC, TRIG, CHOLHDL, LDLDIRECT in the last 72 hours. Thyroid Function Tests: Recent Labs    12/03/18 1049  TSH 2.668  FREET4 1.40*   Anemia Panel: No results for input(s): VITAMINB12, FOLATE, FERRITIN, TIBC, IRON, RETICCTPCT in the last 72 hours. Urine analysis:    Component Value Date/Time   COLORURINE STRAW (A) 12/03/2018 1049   APPEARANCEUR CLEAR (A) 12/03/2018 1049   APPEARANCEUR CLEAR 02/22/2013 2300   LABSPEC 1.008 12/03/2018 1049  LABSPEC 1.013 02/22/2013 2300   PHURINE 8.0 12/03/2018 1049   GLUCOSEU NEGATIVE 12/03/2018 1049   GLUCOSEU NEGATIVE 02/22/2013 2300   HGBUR NEGATIVE 12/03/2018 1049   BILIRUBINUR NEGATIVE 12/03/2018 1049   BILIRUBINUR NEGATIVE 02/22/2013 2300   KETONESUR NEGATIVE 12/03/2018 1049   PROTEINUR NEGATIVE 12/03/2018 1049   NITRITE NEGATIVE 12/03/2018 1049   LEUKOCYTESUR NEGATIVE 12/03/2018 1049   LEUKOCYTESUR NEGATIVE 02/22/2013 2300    Radiological Exams on Admission: Ct Head Wo Contrast  Result Date: 12/03/2018 CLINICAL DATA:  Altered level of consciousness. New onset seizure. History of COVID-19 last month EXAM: CT HEAD WITHOUT CONTRAST TECHNIQUE: Contiguous axial images were obtained from the base of the skull through the vertex without intravenous contrast. COMPARISON:  CT head 02/22/2013 FINDINGS: Brain: Motion degraded study. Progressive atrophy. Progressive white matter disease since 2015. Diffuse white matter hypodensity most consistent with microvascular ischemia. Negative for acute cortical infarct. Negative for hemorrhage or mass. Vascular: Negative for hyperdense vessel Skull: Negative Sinuses/Orbits: Negative Other: None IMPRESSION: No acute intracranial abnormality Motion degraded study  Progression of atrophy and chronic microvascular ischemia since 2015 Electronically Signed   By: Marlan Palauharles  Clark M.D.   On: 12/03/2018 11:10    EKG: Independently reviewed. Sinus rhythm 88.  Assessment/Plan Principal Problem:   Seizures (HCC) Active Problems:   Chronic diastolic CHF (congestive heart failure) (HCC)   Hypothyroid   COVID-19 virus infection  Seizures: New onset seizures, will get EEG. We will start pt on PRN ativan and start keppra,  Neurology consult.   Chronic diastolic CHF: Stable, pt is euvolemic . Cont pt on asa 81 and pravastatin.  Hypothyroidism: Levothyroxine 88mcg.  Recent covid-19 infection a month ago. no pulmonary symptoms.   DVT prophylaxis: Heparin (Lovenox/Heparin/SCD's/anticoagulated/None (if comfort care) Code Status: full (Full/Partial (specify details) Family Communication: None at bedside (Specify name, relationship. Do not write "discussed with patient". Specify tel # if discussed over the phone) Disposition Plan: D/C in 48 hours back to [facility]. (specify when and where you expect patient to be discharged) Consults called: Neurology (with names) Admission status: Observation. (inpatient / obs / tele / medical floor / SDU)   Gertha CalkinEkta V Gaspard Isbell MD [facility] If 7PM-7AM, please contact night-coverage [email or web address] Password [email or web address]  12/03/2018, 5:12 PM

## 2018-12-04 DIAGNOSIS — R569 Unspecified convulsions: Secondary | ICD-10-CM

## 2018-12-04 DIAGNOSIS — I5032 Chronic diastolic (congestive) heart failure: Secondary | ICD-10-CM | POA: Diagnosis not present

## 2018-12-04 LAB — CBC WITH DIFFERENTIAL/PLATELET
Abs Immature Granulocytes: 0.08 10*3/uL — ABNORMAL HIGH (ref 0.00–0.07)
Basophils Absolute: 0.1 10*3/uL (ref 0.0–0.1)
Basophils Relative: 2 %
Eosinophils Absolute: 0 10*3/uL (ref 0.0–0.5)
Eosinophils Relative: 1 %
HCT: 38.9 % (ref 36.0–46.0)
Hemoglobin: 12.3 g/dL (ref 12.0–15.0)
Immature Granulocytes: 2 %
Lymphocytes Relative: 31 %
Lymphs Abs: 1.3 10*3/uL (ref 0.7–4.0)
MCH: 32.8 pg (ref 26.0–34.0)
MCHC: 31.6 g/dL (ref 30.0–36.0)
MCV: 103.7 fL — ABNORMAL HIGH (ref 80.0–100.0)
Monocytes Absolute: 0.4 10*3/uL (ref 0.1–1.0)
Monocytes Relative: 9 %
Neutro Abs: 2.3 10*3/uL (ref 1.7–7.7)
Neutrophils Relative %: 55 %
Platelets: 146 10*3/uL — ABNORMAL LOW (ref 150–400)
RBC: 3.75 MIL/uL — ABNORMAL LOW (ref 3.87–5.11)
RDW: 16.4 % — ABNORMAL HIGH (ref 11.5–15.5)
WBC: 4.1 10*3/uL (ref 4.0–10.5)
nRBC: 0 % (ref 0.0–0.2)

## 2018-12-04 LAB — COMPREHENSIVE METABOLIC PANEL
ALT: 15 U/L (ref 0–44)
AST: 16 U/L (ref 15–41)
Albumin: 3.3 g/dL — ABNORMAL LOW (ref 3.5–5.0)
Alkaline Phosphatase: 30 U/L — ABNORMAL LOW (ref 38–126)
Anion gap: 11 (ref 5–15)
BUN: 8 mg/dL (ref 8–23)
CO2: 28 mmol/L (ref 22–32)
Calcium: 8.6 mg/dL — ABNORMAL LOW (ref 8.9–10.3)
Chloride: 107 mmol/L (ref 98–111)
Creatinine, Ser: 1.13 mg/dL — ABNORMAL HIGH (ref 0.44–1.00)
GFR calc Af Amer: >60 mL/min (ref >60)
GFR calc non Af Amer: 52 mL/min — ABNORMAL LOW (ref >60)
Glucose, Bld: 78 mg/dL (ref 70–99)
Potassium: 3.3 mmol/L — ABNORMAL LOW (ref 3.5–5.1)
Sodium: 146 mmol/L — ABNORMAL HIGH (ref 135–145)
Total Bilirubin: 0.8 mg/dL (ref 0.3–1.2)
Total Protein: 5.9 g/dL — ABNORMAL LOW (ref 6.5–8.1)

## 2018-12-04 LAB — MAGNESIUM: Magnesium: 2 mg/dL (ref 1.7–2.4)

## 2018-12-04 LAB — SARS CORONAVIRUS 2 (TAT 6-24 HRS): SARS Coronavirus 2: NEGATIVE

## 2018-12-04 LAB — PHOSPHORUS: Phosphorus: 3.9 mg/dL (ref 2.5–4.6)

## 2018-12-04 LAB — CK: Total CK: 57 U/L (ref 38–234)

## 2018-12-04 MED ORDER — ASPIRIN EC 81 MG PO TBEC
81.0000 mg | DELAYED_RELEASE_TABLET | Freq: Every day | ORAL | Status: DC
  Administered 2018-12-04 – 2018-12-05 (×2): 81 mg via ORAL
  Filled 2018-12-04 (×2): qty 1

## 2018-12-04 MED ORDER — MEMANTINE HCL 10 MG PO TABS
10.0000 mg | ORAL_TABLET | Freq: Two times a day (BID) | ORAL | Status: DC
  Administered 2018-12-05 (×2): 10 mg via ORAL
  Filled 2018-12-04 (×2): qty 1
  Filled 2018-12-04: qty 2

## 2018-12-04 MED ORDER — ALBUTEROL SULFATE (2.5 MG/3ML) 0.083% IN NEBU: 2.5000 mg | INHALATION_SOLUTION | RESPIRATORY_TRACT | Status: DC | PRN

## 2018-12-04 MED ORDER — LEVOTHYROXINE SODIUM 88 MCG PO TABS
88.0000 ug | ORAL_TABLET | Freq: Every day | ORAL | Status: DC
  Administered 2018-12-05: 88 ug via ORAL
  Filled 2018-12-04: qty 1

## 2018-12-04 MED ORDER — POTASSIUM CHLORIDE CRYS ER 20 MEQ PO TBCR
40.0000 meq | EXTENDED_RELEASE_TABLET | Freq: Once | ORAL | Status: AC
  Administered 2018-12-04: 10:00:00 40 meq via ORAL
  Filled 2018-12-04: qty 2

## 2018-12-04 MED ORDER — SENNA 8.6 MG PO TABS: 2.0000 | ORAL_TABLET | Freq: Every day | ORAL | Status: DC

## 2018-12-04 MED ORDER — PANTOPRAZOLE SODIUM 40 MG PO TBEC
40.0000 mg | DELAYED_RELEASE_TABLET | Freq: Every day | ORAL | Status: DC
  Administered 2018-12-04 – 2018-12-05 (×2): 40 mg via ORAL
  Filled 2018-12-04 (×2): qty 1

## 2018-12-04 MED ORDER — ADULT MULTIVITAMIN W/MINERALS CH
1.0000 | ORAL_TABLET | Freq: Every day | ORAL | Status: DC
  Administered 2018-12-04 – 2018-12-05 (×2): 1 via ORAL
  Filled 2018-12-04 (×2): qty 1

## 2018-12-04 MED ORDER — PRAVASTATIN SODIUM 40 MG PO TABS
40.0000 mg | ORAL_TABLET | Freq: Every day | ORAL | Status: DC
  Administered 2018-12-04: 40 mg via ORAL
  Filled 2018-12-04 (×2): qty 1

## 2018-12-04 MED ORDER — VITAMIN D 25 MCG (1000 UNIT) PO TABS
2000.0000 [IU] | ORAL_TABLET | Freq: Every day | ORAL | Status: DC
  Administered 2018-12-04 – 2018-12-05 (×2): 2000 [IU] via ORAL
  Filled 2018-12-04 (×2): qty 2

## 2018-12-04 MED ORDER — LATANOPROST 0.005 % OP SOLN
1.0000 [drp] | Freq: Every day | OPHTHALMIC | Status: DC
  Administered 2018-12-04: 1 [drp] via OPHTHALMIC
  Filled 2018-12-04: qty 2.5

## 2018-12-04 NOTE — ED Notes (Signed)
Admitting doctor messaged at this time for diet order to be placed, awaiting orders

## 2018-12-04 NOTE — ED Notes (Signed)
Pt given meal tray and water to drink at this time. 

## 2018-12-04 NOTE — ED Notes (Addendum)
ED TO INPATIENT HANDOFF REPORT  ED Nurse Name and Phone #: Geraldine Contras [phone]  S Name/Age/Gender Lori Petersen 63 y.o. female Room/Bed: ED25A/ED25A  Code Status   Code Status: Full Code  Home/SNF/Other Nursing Home Patient oriented to: self Is this baseline? Yes   Triage Complete: Triage complete  Chief Complaint seizure ems  Triage Note Pt comes from [facility] with a seizure lasting about 5 to 7 mins. Pt never had one before. Hx of Down Syndrome. Pt AOx4 for EMS after seizure. Pt not answering all of my questions but able to tell me her name.    Allergies No Known Allergies  Level of Care/Admitting Diagnosis ED Disposition    ED Disposition Condition Comment   Admit  Hospital Area: [facility] [[id number]]  Level of Care: Med-Surg [16]  Covid Evaluation: Asymptomatic Screening Protocol (No Symptoms)  Diagnosis: Seizures (HCC) [170017]  Admitting Physician: Darrold Junker  Attending Physician: Darrold Junker  PT Class (Do Not Modify): Observation [104]  PT Acc Code (Do Not Modify): Observation [10022]       B Medical/Surgery History Past Medical History:  Diagnosis Date  . Arthritis    lower spine  . Asthma   . Diastolic congestive heart failure (HCC)    "only has problems with bad colds"  . Down's syndrome   . Full dentures    does not wear  . GERD (gastroesophageal reflux disease)   . Hypothyroidism   . Onychomycosis    Past Surgical History:  Procedure Laterality Date  . BREAST BIOPSY Left 04/05/2016   neg. cylinder clip  . BREAST BIOPSY Left 12/06/2016   ribbon clip. path pending  . COLONOSCOPY WITH PROPOFOL N/A 04/24/2016   Procedure: COLONOSCOPY WITH PROPOFOL;  Surgeon: Midge Minium, MD;  Location: [facility];  Service: Endoscopy;  Laterality: N/A;  Leave patient at 10:25 due to transportation  . ESOPHAGOGASTRODUODENOSCOPY (EGD) WITH PROPOFOL N/A 04/24/2016   Procedure: ESOPHAGOGASTRODUODENOSCOPY (EGD) WITH  PROPOFOL;  Surgeon: Midge Minium, MD;  Location: [facility];  Service: Endoscopy;  Laterality: N/A;  . HERNIA REPAIR       A IV Location/Drains/Wounds Patient Lines/Drains/Airways Status   Active Line/Drains/Airways    Name:   Placement date:   Placement time:   Site:   Days:   Peripheral IV 12/03/18 Left Hand   12/03/18    1039    Hand   1   Peripheral IV 12/03/18 Right Antecubital   12/03/18    1105    Antecubital   1          Intake/Output Last 24 hours No intake or output data in the 24 hours ending 12/04/18 2052  Labs/Imaging Results for orders placed or performed during the hospital encounter of 12/03/18 (from the past 48 hour(s))  CBC with Differential     Status: Abnormal   Collection Time: 12/03/18 10:49 AM  Result Value Ref Range   WBC 5.2 4.0 - 10.5 K/uL   RBC 4.05 3.87 - 5.11 MIL/uL   Hemoglobin 13.3 12.0 - 15.0 g/dL   HCT 49.4 49.6 - 75.9 %   MCV 99.8 80.0 - 100.0 fL   MCH 32.8 26.0 - 34.0 pg   MCHC 32.9 30.0 - 36.0 g/dL   RDW 16.3 (H) 84.6 - 65.9 %   Platelets 159 150 - 400 K/uL   nRBC 0.0 0.0 - 0.2 %   Neutrophils Relative % 75 %   Neutro Abs 4.0 1.7 -  7.7 K/uL   Lymphocytes Relative 14 %   Lymphs Abs 0.7 0.7 - 4.0 K/uL   Monocytes Relative 6 %   Monocytes Absolute 0.3 0.1 - 1.0 K/uL   Eosinophils Relative 0 %   Eosinophils Absolute 0.0 0.0 - 0.5 K/uL   Basophils Relative 2 %   Basophils Absolute 0.1 0.0 - 0.1 K/uL   Immature Granulocytes 3 %   Abs Immature Granulocytes 0.13 (H) 0.00 - 0.07 K/uL    Comment: Performed at [facility], [address] [place], [place] [address]  Comprehensive metabolic panel     Status: Abnormal   Collection Time: 12/03/18 10:49 AM  Result Value Ref Range   Sodium 143 135 - 145 mmol/L   Potassium 3.8 3.5 - 5.1 mmol/L   Chloride 108 98 - 111 mmol/L   CO2 28 22 - 32 mmol/L   Glucose, Bld 105 (H) 70 - 99 mg/dL   BUN 11 8 - 23 mg/dL   Creatinine, Ser 9.141.16 (H) 0.44 - 1.00 mg/dL   Calcium 8.8 (L) 8.9 -  10.3 mg/dL   Total Protein 6.6 6.5 - 8.1 g/dL   Albumin 3.5 3.5 - 5.0 g/dL   AST 20 15 - 41 U/L   ALT 21 0 - 44 U/L   Alkaline Phosphatase 34 (L) 38 - 126 U/L   Total Bilirubin 0.8 0.3 - 1.2 mg/dL   GFR calc non Af Amer 50 (L) >60 mL/min   GFR calc Af Amer 58 (L) >60 mL/min   Anion gap 7 5 - 15    Comment: Performed at [facility], [address] [place], [place] [address]  Urinalysis, Routine w reflex microscopic     Status: Abnormal   Collection Time: 12/03/18 10:49 AM  Result Value Ref Range   Color, Urine STRAW (A) YELLOW   APPearance CLEAR (A) CLEAR   Specific Gravity, Urine 1.008 1.005 - 1.030   pH 8.0 5.0 - 8.0   Glucose, UA NEGATIVE NEGATIVE mg/dL   Hgb urine dipstick NEGATIVE NEGATIVE   Bilirubin Urine NEGATIVE NEGATIVE   Ketones, ur NEGATIVE NEGATIVE mg/dL   Protein, ur NEGATIVE NEGATIVE mg/dL   Nitrite NEGATIVE NEGATIVE   Leukocytes,Ua NEGATIVE NEGATIVE   WBC, UA 0-5 0 - 5 WBC/hpf   Bacteria, UA NONE SEEN NONE SEEN   Squamous Epithelial / LPF 0-5 0 - 5    Comment: Performed at [facility], [address] [place], [place] [address]  Magnesium     Status: None   Collection Time: 12/03/18 10:49 AM  Result Value Ref Range   Magnesium 2.0 1.7 - 2.4 mg/dL    Comment: Performed at [facility], [address] [place], [place] [address]  Phosphorus     Status: None   Collection Time: 12/03/18 10:49 AM  Result Value Ref Range   Phosphorus 3.4 2.5 - 4.6 mg/dL    Comment: Performed at [facility], [address] [place], [place] [address]  T4, free     Status: Abnormal   Collection Time: 12/03/18 10:49 AM  Result Value Ref Range   Free T4 1.40 (H) 0.61 - 1.12 ng/dL    Comment: (NOTE) Biotin ingestion may interfere with free T4 tests. If the results are inconsistent with the TSH level, previous test results, or the clinical presentation, then consider biotin interference. If needed, order repeat testing after stopping  biotin. Performed at [facility], [address] [place], [place] [address]   TSH  Status: None   Collection Time: 12/03/18 10:49 AM  Result Value Ref Range   TSH 2.668 0.350 - 4.500 uIU/mL    Comment: Performed by a 3rd Generation assay with a functional sensitivity of <=0.01 uIU/mL. Performed at [facility], [address] [place], [place] [address]   Vitamin B12     Status: None   Collection Time: 12/03/18 10:49 AM  Result Value Ref Range   Vitamin B-12 574 180 - 914 pg/mL    Comment: (NOTE) This assay is not validated for testing neonatal or myeloproliferative syndrome specimens for Vitamin B12 levels. Performed at [facility], [place] [address] [place], [place] [address]   Type and screen     Status: None   Collection Time: 12/03/18  6:55 PM  Result Value Ref Range   ABO/RH(D) A POS    Antibody Screen NEG    Sample Expiration      12/06/2018,2359 Performed at [facility], [address] [place], [place] [address]   CBC with Differential     Status: Abnormal   Collection Time: 12/04/18  5:34 AM  Result Value Ref Range   WBC 4.1 4.0 - 10.5 K/uL   RBC 3.75 (L) 3.87 - 5.11 MIL/uL   Hemoglobin 12.3 12.0 - 15.0 g/dL   HCT 38.9 36.0 - 46.0 %   MCV 103.7 (H) 80.0 - 100.0 fL   MCH 32.8 26.0 - 34.0 pg   MCHC 31.6 30.0 - 36.0 g/dL   RDW 16.4 (H) 11.5 - 15.5 %   Platelets 146 (L) 150 - 400 K/uL   nRBC 0.0 0.0 - 0.2 %   Neutrophils Relative % 55 %   Neutro Abs 2.3 1.7 - 7.7 K/uL   Lymphocytes Relative 31 %   Lymphs Abs 1.3 0.7 - 4.0 K/uL   Monocytes Relative 9 %   Monocytes Absolute 0.4 0.1 - 1.0 K/uL   Eosinophils Relative 1 %   Eosinophils Absolute 0.0 0.0 - 0.5 K/uL   Basophils Relative 2 %   Basophils Absolute 0.1 0.0 - 0.1 K/uL   Immature Granulocytes 2 %   Abs Immature Granulocytes 0.08 (H) 0.00 - 0.07 K/uL    Comment: Performed at [facility], [address] [place], [place] [address]  CK     Status: None    Collection Time: 12/04/18  5:34 AM  Result Value Ref Range   Total CK 57 38 - 234 U/L    Comment: Performed at [facility], [address] [place], [place] [address]  Comprehensive metabolic panel     Status: Abnormal   Collection Time: 12/04/18  5:34 AM  Result Value Ref Range   Sodium 146 (H) 135 - 145 mmol/L   Potassium 3.3 (L) 3.5 - 5.1 mmol/L   Chloride 107 98 - 111 mmol/L   CO2 28 22 - 32 mmol/L   Glucose, Bld 78 70 - 99 mg/dL   BUN 8 8 - 23 mg/dL   Creatinine, Ser 1.13 (H) 0.44 - 1.00 mg/dL   Calcium 8.6 (L) 8.9 - 10.3 mg/dL   Total Protein 5.9 (L) 6.5 - 8.1 g/dL   Albumin 3.3 (L) 3.5 - 5.0 g/dL   AST 16 15 - 41 U/L   ALT 15 0 - 44 U/L   Alkaline Phosphatase 30 (L) 38 - 126 U/L   Total Bilirubin 0.8 0.3 - 1.2 mg/dL   GFR calc non Af Amer 52 (L) >60 mL/min   GFR calc  Af Amer >60 >60 mL/min   Anion gap 11 5 - 15    Comment: Performed at [facility], [address] [place], [place] [address]  Magnesium     Status: None   Collection Time: 12/04/18  5:34 AM  Result Value Ref Range   Magnesium 2.0 1.7 - 2.4 mg/dL    Comment: Performed at [facility], [address] [place], [place] [address]  Phosphorus     Status: None   Collection Time: 12/04/18  5:34 AM  Result Value Ref Range   Phosphorus 3.9 2.5 - 4.6 mg/dL    Comment: Performed at [facility], [address] [place], [place] [address]  SARS CORONAVIRUS 2 (TAT 6-24 HRS) Nasopharyngeal Nasopharyngeal Swab     Status: None   Collection Time: 12/04/18  7:20 AM   Specimen: Nasopharyngeal Swab  Result Value Ref Range   SARS Coronavirus 2 NEGATIVE NEGATIVE    Comment: (NOTE) SARS-CoV-2 target nucleic acids are NOT DETECTED. The SARS-CoV-2 RNA is generally detectable in upper and lower respiratory specimens during the acute phase of infection. Negative results do not preclude SARS-CoV-2 infection, do not rule out co-infections with other pathogens, and should not be used  as the sole basis for treatment or other patient management decisions. Negative results must be combined with clinical observations, patient history, and epidemiological information. The expected result is Negative. Fact Sheet for Patients: HairSlick.no Fact Sheet for Healthcare Providers: quierodirigir.com This test is not yet approved or cleared by the [place] FDA and  has been authorized for detection and/or diagnosis of SARS-CoV-2 by FDA under an Emergency Use Authorization (EUA). This EUA will remain  in effect (meaning this test can be used) for the duration of the COVID-19 declaration under Section 56 4(b)(1) of the Act, 21 U.S.C. section 360bbb-3(b)(1), unless the authorization is terminated or revoked sooner. Performed at [facility], [address], [place], [place] [address]    Ct Head Wo Contrast  Result Date: 12/03/2018 CLINICAL DATA:  Altered level of consciousness. New onset seizure. History of COVID-19 last month EXAM: CT HEAD WITHOUT CONTRAST TECHNIQUE: Contiguous axial images were obtained from the base of the skull through the vertex without intravenous contrast. COMPARISON:  CT head 02/22/2013 FINDINGS: Brain: Motion degraded study. Progressive atrophy. Progressive white matter disease since 2015. Diffuse white matter hypodensity most consistent with microvascular ischemia. Negative for acute cortical infarct. Negative for hemorrhage or mass. Vascular: Negative for hyperdense vessel Skull: Negative Sinuses/Orbits: Negative Other: None IMPRESSION: No acute intracranial abnormality Motion degraded study Progression of atrophy and chronic microvascular ischemia since 2015 Electronically Signed   By: Marlan Palau M.D.   On: 12/03/2018 11:10   Mr Brain Wo Contrast  Result Date: 12/03/2018 CLINICAL DATA:  63 year old female with unexplained new seizure. Down syndrome. COVID-19 1 month ago. EXAM: MRI HEAD  WITHOUT CONTRAST TECHNIQUE: Multiplanar, multiecho pulse sequences of the brain and surrounding structures were obtained without intravenous contrast. COMPARISON:  Head CT earlier tonight. FINDINGS: Brain: No restricted diffusion to suggest acute infarction. No midline shift, mass effect, evidence of mass lesion, ventriculomegaly, extra-axial collection or acute intracranial hemorrhage. Cervicomedullary junction within normal limits. Partially empty sella. Patchy and confluent bilateral cerebral white matter T2 and FLAIR hyperintensity, moderate for age. Similar confluent T2 hyperintensity in the pons. Mild T2 heterogeneity in the deep gray matter nuclei. No cortical encephalomalacia or chronic cerebral blood products identified. No definite hippocampal asymmetry on thin slice coronal T2. Vascular: Major intracranial  vascular flow voids are preserved. Skull and upper cervical spine: Negative visible cervical spine. Normal bone marrow signal. Sinuses/Orbits: Negative orbits. Paranasal sinuses and mastoids are clear. Other: Grossly normal visible internal auditory structures. IMPRESSION: 1. No acute intracranial abnormality. 2. Moderate for age nonspecific signal changes in the cerebral white matter and pons, most commonly due to chronic small vessel disease. Electronically Signed   By: Odessa Fleming M.D.   On: 12/03/2018 19:12    Pending Labs Unresulted Labs (From admission, onward)    Start     Ordered   12/03/18 1306  Lyme disease, western blot  Add-on,   AD     12/03/18 1308          Vitals/Pain Today's Vitals   12/04/18 1000 12/04/18 1021 12/04/18 1548 12/04/18 1930  BP: (!) 164/88  (!) 163/118 (!) 107/93  Pulse:  77 85 83  Resp:   18 (!) 23  Temp:   98.2 F (36.8 C)   TempSrc:   Oral   SpO2:  96% 98% 92%  Weight:      Height:      PainSc:        Isolation Precautions Airborne and Contact precautions  Medications Medications  lactated ringers infusion ( Intravenous Rate/Dose Verify  12/04/18 0541)  aspirin EC tablet 81 mg (81 mg Oral Given 12/04/18 1607)  pravastatin (PRAVACHOL) tablet 40 mg (40 mg Oral Given 12/04/18 1836)  memantine (NAMENDA) tablet 10 mg (has no administration in time range)  levothyroxine (SYNTHROID) tablet 88 mcg (has no administration in time range)  pantoprazole (PROTONIX) EC tablet 40 mg (40 mg Oral Given 12/04/18 1607)  senna (SENOKOT) tablet 17.2 mg (has no administration in time range)  cholecalciferol (VITAMIN D3) tablet 2,000 Units (2,000 Units Oral Given 12/04/18 1607)  multivitamin with minerals tablet 1 tablet (1 tablet Oral Given 12/04/18 1607)  albuterol (PROVENTIL) (2.5 MG/3ML) 0.083% nebulizer solution 2.5 mg (has no administration in time range)  latanoprost (XALATAN) 0.005 % ophthalmic solution 1 drop (has no administration in time range)  potassium chloride SA (KLOR-CON) CR tablet 40 mEq (40 mEq Oral Given 12/04/18 1018)    Mobility non-ambulatory Low fall risk   Focused Assessments    R Recommendations: See Admitting Provider Note  Report given to: Rodney Booze, RN

## 2018-12-04 NOTE — ED Notes (Signed)
Checked on patient, placed pulse ox. Patient sleeping, awoke her to reposition in bed. Purewick in proper place, brief dry. Urine emptied from canister. Patient given call bell and instructed to call for any assistance needed

## 2018-12-04 NOTE — ED Notes (Signed)
PACE doctor updated on status at this time

## 2018-12-04 NOTE — ED Notes (Signed)
.. ED TO INPATIENT HANDOFF REPORT  ED Nurse Name and Phone #: Deneise Lever [phone]  G Name/Age/Gender Lori Petersen 63 y.o. female Room/Bed: ED25A/ED25A  Code Status   Code Status: Full Code  Home/SNF/Other Skilled nursing facility Patient oriented to: self and place Is this baseline? Yes   Triage Complete: Triage complete  Chief Complaint seizure ems  Triage Note Pt comes from [facility] with a seizure lasting about 5 to 7 mins. Pt never had one before. Hx of Down Syndrome. Pt AOx4 for EMS after seizure. Pt not answering all of my questions but able to tell me her name.    Allergies No Known Allergies  Level of Care/Admitting Diagnosis ED Disposition    ED Disposition Condition [facility] Area: [place] [100120]  Level of Care: Med-Surg [16]  Covid Evaluation: Asymptomatic Screening Protocol (No Symptoms)  Diagnosis: Seizures ([place]) [387564]  Admitting Physician: Cherylann Ratel  Attending Physician: Cherylann Ratel  PT Class (Do Not Modify): Observation [104]  PT Acc Code (Do Not Modify): Observation [10022]       B Medical/Surgery History Past Medical History:  Diagnosis Date  . Arthritis    lower spine  . Asthma   . Diastolic congestive heart failure ([place])    "only has problems with bad colds"  . Down's syndrome   . Full dentures    does not wear  . GERD (gastroesophageal reflux disease)   . Hypothyroidism   . Onychomycosis    Past Surgical History:  Procedure Laterality Date  . BREAST BIOPSY Left 04/05/2016   neg. cylinder clip  . BREAST BIOPSY Left 12/06/2016   ribbon clip. path pending  . COLONOSCOPY WITH PROPOFOL N/A 04/24/2016   Procedure: COLONOSCOPY WITH PROPOFOL;  Surgeon: Lucilla Lame, MD;  Location: [place];  Service: Endoscopy;  Laterality: N/A;  Leave patient at 10:25 due to transportation  . ESOPHAGOGASTRODUODENOSCOPY (EGD) WITH PROPOFOL N/A 04/24/2016   Procedure:  ESOPHAGOGASTRODUODENOSCOPY (EGD) WITH PROPOFOL;  Surgeon: Lucilla Lame, MD;  Location: [place];  Service: Endoscopy;  Laterality: N/A;  . HERNIA REPAIR       A IV Location/Drains/Wounds Patient Lines/Drains/Airways Status   Active Line/Drains/Airways    Name:   Placement date:   Placement time:   Site:   Days:   Peripheral IV 12/03/18 Left Hand   12/03/18    1039    Hand   1   Peripheral IV 12/03/18 Right Antecubital   12/03/18    1105    Antecubital   1          Intake/Output Last 24 hours  Intake/Output Summary (Last 24 hours) at 12/04/2018 1451 Last data filed at 12/03/2018 1928 Gross per 24 hour  Intake -  Output 675 ml  Net -675 ml    Labs/Imaging Results for orders placed or performed during the hospital encounter of 12/03/18 (from the past 48 hour(s))  CBC with Differential     Status: Abnormal   Collection Time: 12/03/18 10:49 AM  Result Value Ref Range   WBC 5.2 4.0 - 10.5 K/uL   RBC 4.05 3.87 - 5.11 MIL/uL   Hemoglobin 13.3 12.0 - 15.0 g/dL   HCT 40.4 36.0 - 46.0 %   MCV 99.8 80.0 - 100.0 fL   MCH 32.8 26.0 - 34.0 pg   MCHC 32.9 30.0 - 36.0 g/dL   RDW 16.0 (H) 11.5 - 15.5 %   Platelets 159 150 -  400 K/uL   nRBC 0.0 0.0 - 0.2 %   Neutrophils Relative % 75 %   Neutro Abs 4.0 1.7 - 7.7 K/uL   Lymphocytes Relative 14 %   Lymphs Abs 0.7 0.7 - 4.0 K/uL   Monocytes Relative 6 %   Monocytes Absolute 0.3 0.1 - 1.0 K/uL   Eosinophils Relative 0 %   Eosinophils Absolute 0.0 0.0 - 0.5 K/uL   Basophils Relative 2 %   Basophils Absolute 0.1 0.0 - 0.1 K/uL   Immature Granulocytes 3 %   Abs Immature Granulocytes 0.13 (H) 0.00 - 0.07 K/uL    Comment: Performed at [facility], [address] [place], [place] [address]  Comprehensive metabolic panel     Status: Abnormal   Collection Time: 12/03/18 10:49 AM  Result Value Ref Range   Sodium 143 135 - 145 mmol/L   Potassium 3.8 3.5 - 5.1 mmol/L   Chloride 108 98 - 111 mmol/L   CO2 28 22 - 32  mmol/L   Glucose, Bld 105 (H) 70 - 99 mg/dL   BUN 11 8 - 23 mg/dL   Creatinine, Ser 4.09 (H) 0.44 - 1.00 mg/dL   Calcium 8.8 (L) 8.9 - 10.3 mg/dL   Total Protein 6.6 6.5 - 8.1 g/dL   Albumin 3.5 3.5 - 5.0 g/dL   AST 20 15 - 41 U/L   ALT 21 0 - 44 U/L   Alkaline Phosphatase 34 (L) 38 - 126 U/L   Total Bilirubin 0.8 0.3 - 1.2 mg/dL   GFR calc non Af Amer 50 (L) >60 mL/min   GFR calc Af Amer 58 (L) >60 mL/min   Anion gap 7 5 - 15    Comment: Performed at [facility], [address] [place], [place] [address]  Urinalysis, Routine w reflex microscopic     Status: Abnormal   Collection Time: 12/03/18 10:49 AM  Result Value Ref Range   Color, Urine STRAW (A) YELLOW   APPearance CLEAR (A) CLEAR   Specific Gravity, Urine 1.008 1.005 - 1.030   pH 8.0 5.0 - 8.0   Glucose, UA NEGATIVE NEGATIVE mg/dL   Hgb urine dipstick NEGATIVE NEGATIVE   Bilirubin Urine NEGATIVE NEGATIVE   Ketones, ur NEGATIVE NEGATIVE mg/dL   Protein, ur NEGATIVE NEGATIVE mg/dL   Nitrite NEGATIVE NEGATIVE   Leukocytes,Ua NEGATIVE NEGATIVE   WBC, UA 0-5 0 - 5 WBC/hpf   Bacteria, UA NONE SEEN NONE SEEN   Squamous Epithelial / LPF 0-5 0 - 5    Comment: Performed at [facility], [address], [place], [place] [address]  Magnesium     Status: None   Collection Time: 12/03/18 10:49 AM  Result Value Ref Range   Magnesium 2.0 1.7 - 2.4 mg/dL    Comment: Performed at [facility], [address] [place], [place] [address]  Phosphorus     Status: None   Collection Time: 12/03/18 10:49 AM  Result Value Ref Range   Phosphorus 3.4 2.5 - 4.6 mg/dL    Comment: Performed at [facility], [address] [place], [place] [address]  T4, free     Status: Abnormal   Collection Time: 12/03/18 10:49 AM  Result Value Ref Range   Free T4 1.40 (H) 0.61 - 1.12 ng/dL    Comment: (NOTE) Biotin ingestion may interfere with free T4 tests. If the results are inconsistent with the TSH level,  previous test results, or the clinical presentation, then consider biotin  interference. If needed, order repeat testing after stopping biotin. Performed at [facility], [address] [place], [place] [address]   TSH     Status: None   Collection Time: 12/03/18 10:49 AM  Result Value Ref Range   TSH 2.668 0.350 - 4.500 uIU/mL    Comment: Performed by a 3rd Generation assay with a functional sensitivity of <=0.01 uIU/mL. Performed at [facility], [address] [place], [place] [address]   Vitamin B12     Status: None   Collection Time: 12/03/18 10:49 AM  Result Value Ref Range   Vitamin B-12 574 180 - 914 pg/mL    Comment: (NOTE) This assay is not validated for testing neonatal or myeloproliferative syndrome specimens for Vitamin B12 levels. Performed at [facility], [address] [place], [place] [address]   Type and screen     Status: None   Collection Time: 12/03/18  6:55 PM  Result Value Ref Range   ABO/RH(D) A POS    Antibody Screen NEG    Sample Expiration      12/06/2018,2359 Performed at [facility], [address] [place], [place] [address]   CBC with Differential     Status: Abnormal   Collection Time: 12/04/18  5:34 AM  Result Value Ref Range   WBC 4.1 4.0 - 10.5 K/uL   RBC 3.75 (L) 3.87 - 5.11 MIL/uL   Hemoglobin 12.3 12.0 - 15.0 g/dL   HCT 57.838.9 46.936.0 - 62.946.0 %   MCV 103.7 (H) 80.0 - 100.0 fL   MCH 32.8 26.0 - 34.0 pg   MCHC 31.6 30.0 - 36.0 g/dL   RDW 52.816.4 (H) 41.311.5 - 24.415.5 %   Platelets 146 (L) 150 - 400 K/uL   nRBC 0.0 0.0 - 0.2 %   Neutrophils Relative % 55 %   Neutro Abs 2.3 1.7 - 7.7 K/uL   Lymphocytes Relative 31 %   Lymphs Abs 1.3 0.7 - 4.0 K/uL   Monocytes Relative 9 %   Monocytes Absolute 0.4 0.1 - 1.0 K/uL   Eosinophils Relative 1 %   Eosinophils Absolute 0.0 0.0 - 0.5 K/uL   Basophils Relative 2 %   Basophils Absolute 0.1 0.0 - 0.1 K/uL   Immature Granulocytes 2 %   Abs Immature Granulocytes 0.08  (H) 0.00 - 0.07 K/uL    Comment: Performed at [facility], [address] [place], [place] [address]  CK     Status: None   Collection Time: 12/04/18  5:34 AM  Result Value Ref Range   Total CK 57 38 - 234 U/L    Comment: Performed at [facility], [address] [place], [place] [address]  Comprehensive metabolic panel     Status: Abnormal   Collection Time: 12/04/18  5:34 AM  Result Value Ref Range   Sodium 146 (H) 135 - 145 mmol/L   Potassium 3.3 (L) 3.5 - 5.1 mmol/L   Chloride 107 98 - 111 mmol/L   CO2 28 22 - 32 mmol/L   Glucose, Bld 78 70 - 99 mg/dL   BUN 8 8 - 23 mg/dL   Creatinine, Ser 4.401.13 (H) 0.44 - 1.00 mg/dL   Calcium 8.6 (L) 8.9 - 10.3 mg/dL   Total Protein 5.9 (L) 6.5 - 8.1 g/dL   Albumin 3.3 (L) 3.5 - 5.0 g/dL   AST 16 15 - 41 U/L   ALT 15 0 - 44 U/L   Alkaline Phosphatase 30 (L)  38 - 126 U/L   Total Bilirubin 0.8 0.3 - 1.2 mg/dL   GFR calc non Af Amer 52 (L) >60 mL/min   GFR calc Af Amer >60 >60 mL/min   Anion gap 11 5 - 15    Comment: Performed at [facility], [address] [place], [place] [address]  Magnesium     Status: None   Collection Time: 12/04/18  5:34 AM  Result Value Ref Range   Magnesium 2.0 1.7 - 2.4 mg/dL    Comment: Performed at [facility], [address] [place], [place] [address]  Phosphorus     Status: None   Collection Time: 12/04/18  5:34 AM  Result Value Ref Range   Phosphorus 3.9 2.5 - 4.6 mg/dL    Comment: Performed at [facility], [address] [place], [place] [address]   Ct Head Wo Contrast  Result Date: 12/03/2018 CLINICAL DATA:  Altered level of consciousness. New onset seizure. History of COVID-19 last month EXAM: CT HEAD WITHOUT CONTRAST TECHNIQUE: Contiguous axial images were obtained from the base of the skull through the vertex without intravenous contrast. COMPARISON:  CT head 02/22/2013 FINDINGS: Brain: Motion degraded study. Progressive atrophy. Progressive  white matter disease since 2015. Diffuse white matter hypodensity most consistent with microvascular ischemia. Negative for acute cortical infarct. Negative for hemorrhage or mass. Vascular: Negative for hyperdense vessel Skull: Negative Sinuses/Orbits: Negative Other: None IMPRESSION: No acute intracranial abnormality Motion degraded study Progression of atrophy and chronic microvascular ischemia since 2015 Electronically Signed   By: Marlan Palau M.D.   On: 12/03/2018 11:10   Mr Brain Wo Contrast  Result Date: 12/03/2018 CLINICAL DATA:  63 year old female with unexplained new seizure. Down syndrome. COVID-19 1 month ago. EXAM: MRI HEAD WITHOUT CONTRAST TECHNIQUE: Multiplanar, multiecho pulse sequences of the brain and surrounding structures were obtained without intravenous contrast. COMPARISON:  Head CT earlier tonight. FINDINGS: Brain: No restricted diffusion to suggest acute infarction. No midline shift, mass effect, evidence of mass lesion, ventriculomegaly, extra-axial collection or acute intracranial hemorrhage. Cervicomedullary junction within normal limits. Partially empty sella. Patchy and confluent bilateral cerebral white matter T2 and FLAIR hyperintensity, moderate for age. Similar confluent T2 hyperintensity in the pons. Mild T2 heterogeneity in the deep gray matter nuclei. No cortical encephalomalacia or chronic cerebral blood products identified. No definite hippocampal asymmetry on thin slice coronal T2. Vascular: Major intracranial vascular flow voids are preserved. Skull and upper cervical spine: Negative visible cervical spine. Normal bone marrow signal. Sinuses/Orbits: Negative orbits. Paranasal sinuses and mastoids are clear. Other: Grossly normal visible internal auditory structures. IMPRESSION: 1. No acute intracranial abnormality. 2. Moderate for age nonspecific signal changes in the cerebral white matter and pons, most commonly due to chronic small vessel disease. Electronically  Signed   By: Odessa Fleming M.D.   On: 12/03/2018 19:12    Pending Labs Unresulted Labs (From admission, onward)    Start     Ordered   12/04/18 0716  SARS CORONAVIRUS 2 (TAT 6-24 HRS) Nasopharyngeal Nasopharyngeal Swab  (Asymptomatic/Tier 2 Patients Labs)  Once,   STAT    Question Answer Comment  Is this test for diagnosis or screening Screening   Symptomatic for COVID-19 as defined by CDC No   Hospitalized for COVID-19 No   Admitted to ICU for COVID-19 No   Previously tested for COVID-19 Yes   Resident in a congregate (group) care setting No   Employed in healthcare setting No   Pregnant No  12/04/18 0715   12/03/18 1306  Lyme disease, western blot  Add-on,   AD     12/03/18 1308          Vitals/Pain Today's Vitals   12/04/18 0815 12/04/18 0834 12/04/18 1000 12/04/18 1021  BP:   (!) 164/88   Pulse: 76 100  77  Resp:      Temp:      TempSrc:      SpO2: 97% 96%  96%  Weight:      Height:      PainSc:        Isolation Precautions Airborne and Contact precautions  Medications Medications  lactated ringers infusion ( Intravenous Rate/Dose Verify 12/04/18 0541)  potassium chloride SA (KLOR-CON) CR tablet 40 mEq (40 mEq Oral Given 12/04/18 1018)    Mobility non-ambulatory Low fall risk   Focused Assessments Neuro Assessment Handoff:  Swallow screen pass? Yes  Cardiac Rhythm: Normal sinus rhythm       Neuro Assessment: Exceptions to WDL Neuro Checks:      Last Documented NIHSS Modified Score:   Has TPA been given? No If patient is a Neuro Trauma and patient is going to OR before floor call report to 4N Charge nurse: [phone] or [phone]     R Recommendations: See Admitting Provider Note  Report given to:   Additional Notes:

## 2018-12-04 NOTE — ED Notes (Signed)
Iv infusing.  Pt alert.  purewick in place.

## 2018-12-04 NOTE — ED Notes (Signed)
Pt eating dinner tray °

## 2018-12-04 NOTE — ED Notes (Signed)
Pt repositioned in bed. Pure wick back in place. Call light within reach. Pt has no further needs at this time.

## 2018-12-04 NOTE — ED Notes (Signed)
Resumed care from annie rn.  Pt in EEG.

## 2018-12-04 NOTE — ED Notes (Signed)
Spoke with patient's sister via phone and gave an update on pt status.

## 2018-12-04 NOTE — ED Notes (Signed)
Patient repositioned, linens and purewick changed. New brief applied. Patient voices no complaints, will continue to monitor. VSS

## 2018-12-04 NOTE — ED Notes (Signed)
Resumed care from [place] rn.  Pt sleeping.  Iv fluid infusing.  Seizure pads in place.

## 2018-12-04 NOTE — ED Notes (Signed)
Report off to kate rn  

## 2018-12-04 NOTE — ED Notes (Signed)
Report off to dee rn 

## 2018-12-04 NOTE — Procedures (Signed)
ELECTROENCEPHALOGRAM REPORT   Patient: Lori Petersen       Room #: SW54O EEG No. ID: [id number] Age: 63 y.o.        Sex: female Referring Physician: Swayze Report Date:  12/04/2018        Interpreting Physician: Alexis Goodell  History: Lori Petersen is an 63 y.o. female with seizure activity  Medications:  ASA, Namenda, Pravachol, Synthroid  Conditions of Recording:  This is a 21 channel routine scalp EEG performed with bipolar and monopolar montages arranged in accordance to the international 10/20 system of electrode placement. One channel was dedicated to EKG recording.  The patient is in the awake and drowsy states.  Description:  Artifact is prominent during the recording often obscuring the background rhythm. When able to be visualized the waking background activity consists of a low voltage, symmetrical, fairly well organized, 8 Hz alpha activity, seen from the parieto-occipital and posterior temporal regions.  Low voltage fast activity, poorly organized, is seen anteriorly and is at times superimposed on more posterior regions.  A mixture of theta and alpha rhythms are seen from the central and temporal regions. The patient drowses with slowing to irregular, low voltage theta and beta activity.   Stage II sleep is not obtained. No epileptiform activity is noted.   Hyperventilation was not performed.  Intermittent photic stimulation was performed but failed to illicit any change in the tracing.    IMPRESSION: Normal electroencephalogram, awake, drowsy and with activation procedures. There are no focal lateralizing or epileptiform features.   Alexis Goodell, MD Neurology [phone] 12/04/2018, 4:28 PM

## 2018-12-04 NOTE — ED Notes (Signed)
Apple sauce given with K

## 2018-12-04 NOTE — ED Notes (Signed)
Pt eating dinner with no issue. Will continue to monitor.

## 2018-12-04 NOTE — Discharge Summary (Addendum)
Physician Discharge Summary  Lori Petersen [facility] ZOX:[id number] DOB: 05-Oct-1955 DOA: 12/03/2018  PCP: [facility]  Admit date: 12/03/2018 Discharge date: 12/04/2018  Recommendations for Outpatient Follow-up:  1. Follow up with PCP in 7-10 days.  Discharge Diagnoses: Principal diagnosis is #1 1. Isolated seizure.  Discharge Condition: Fair  Disposition: Return to long term care facility.  Diet recommendation: heart healthy  Filed Weights   12/03/18 1013  Weight: 99 kg    History of present illness:   Lori Petersen [facility] is a 63 y.o. female with medical history significant of CHF, Downs syndrome and seen in ed for seizures that occurred today  And pt had bowel incontinence.History is from ems and EDMD note.    ED Course:  Blood pressure (!) 144/98, pulse 73, temperature 98.4 F (36.9 C), temperature source Oral, resp. rate 11, height  (1.727 m), weight 99 kg, SpO2 100 %. Usual CBC shows a hemoglobin of 13.3 with a hematocrit of 40.4, RDW of 16.0, platelets of 159.  CMP shows sodium of 143, potassium of 3.8, blood glucose 105, creatinine of 1.16 with an estimated GFR 58., normal LFTs. Initial head CT with negative no acute intracranial abnormality, motion degraded study.    Hospital Course: The patient was roomed in the ED awaiting a bed upstairs. CT head was negative. EEG was obtained and was negative. I discussed the patient with her PCP. The patient will be returned to her long term care facility when a bed is available.  Today's assessment: S: The patient is resting comfortably. No new complaints.  O: Vitals:  Vitals:   12/04/18 1021 12/04/18 1548  BP:  (!) 163/118  Pulse: 77 85  Resp:  18  Temp:  98.2 F (36.8 C)  SpO2: 96% 98%   Exam:  Constitutional:  . The patient is awake and alert.  No acute distress. Respiratory:  . No increased work of breathing. . No wheezes, rales, or rhonchi . No tactile fremitus Cardiovascular:  . Regular rate  and rhythm . No murmurs, ectopy, or gallups. . No lateral PMI. No thrills. Abdomen:  . Abdomen is soft, non-tender, non-distended . No hernias, masses, or organomegaly . Normoactive bowel sounds.  Musculoskeletal:  . No cyanosis, clubbing, or edema Skin:  . No rashes, lesions, ulcers . palpation of skin: no induration or nodules Neurologic:  . CN 2-12 intact . Moving all extremities. Psychiatric:  . Mood and affect are congruent.  Discharge Instructions  Discharge Instructions    Activity as tolerated - No restrictions   Complete by: As directed    Call MD for:   Complete by: As directed    Further seizure activity.   Diet - low sodium heart healthy   Complete by: As directed    Discharge instructions   Complete by: As directed    Follow up with PCP in 7-10 days.   Increase activity slowly   Complete by: As directed      Allergies as of 12/04/2018   No Known Allergies     Medication List    TAKE these medications   albuterol 108 (90 Base) MCG/ACT inhaler Commonly known as: VENTOLIN HFA Inhale 1 puff into the lungs every 4 (four) hours as needed for wheezing or shortness of breath.   aspirin EC 81 MG tablet Take 81 mg by mouth daily.   cholecalciferol 25 MCG (1000 UT) tablet Commonly known as: VITAMIN D3 Take 2,000 Units by mouth daily.   latanoprost 0.005 %  ophthalmic solution Commonly known as: XALATAN Place 1 drop into the left eye at bedtime.   levothyroxine 88 MCG tablet Commonly known as: SYNTHROID Take 88 mcg by mouth daily before breakfast.   memantine 10 MG tablet Commonly known as: NAMENDA Take 10 mg by mouth 2 (two) times daily.   multivitamin with minerals Tabs tablet Take 1 tablet by mouth daily.   omeprazole 20 MG capsule Commonly known as: PRILOSEC Take 20 mg by mouth daily.   pravastatin 40 MG tablet Commonly known as: PRAVACHOL Take 40 mg by mouth daily.   senna 8.6 MG Tabs tablet Commonly known as: SENOKOT Take 2 tablets  by mouth at bedtime.      No Known Allergies  The results of significant diagnostics from this hospitalization (including imaging, microbiology, ancillary and laboratory) are listed below for reference.    Significant Diagnostic Studies: Ct Head Wo Contrast  Result Date: 12/03/2018 CLINICAL DATA:  Altered level of consciousness. New onset seizure. History of COVID-19 last month EXAM: CT HEAD WITHOUT CONTRAST TECHNIQUE: Contiguous axial images were obtained from the base of the skull through the vertex without intravenous contrast. COMPARISON:  CT head 02/22/2013 FINDINGS: Brain: Motion degraded study. Progressive atrophy. Progressive white matter disease since 2015. Diffuse white matter hypodensity most consistent with microvascular ischemia. Negative for acute cortical infarct. Negative for hemorrhage or mass. Vascular: Negative for hyperdense vessel Skull: Negative Sinuses/Orbits: Negative Other: None IMPRESSION: No acute intracranial abnormality Motion degraded study Progression of atrophy and chronic microvascular ischemia since 2015 Electronically Signed   By: Franchot Gallo M.D.   On: 12/03/2018 11:10   Mr Brain Wo Contrast  Result Date: 12/03/2018 CLINICAL DATA:  63 year old female with unexplained new seizure. Down syndrome. COVID-19 1 month ago. EXAM: MRI HEAD WITHOUT CONTRAST TECHNIQUE: Multiplanar, multiecho pulse sequences of the brain and surrounding structures were obtained without intravenous contrast. COMPARISON:  Head CT earlier tonight. FINDINGS: Brain: No restricted diffusion to suggest acute infarction. No midline shift, mass effect, evidence of mass lesion, ventriculomegaly, extra-axial collection or acute intracranial hemorrhage. Cervicomedullary junction within normal limits. Partially empty sella. Patchy and confluent bilateral cerebral white matter T2 and FLAIR hyperintensity, moderate for age. Similar confluent T2 hyperintensity in the pons. Mild T2 heterogeneity in the  deep gray matter nuclei. No cortical encephalomalacia or chronic cerebral blood products identified. No definite hippocampal asymmetry on thin slice coronal T2. Vascular: Major intracranial vascular flow voids are preserved. Skull and upper cervical spine: Negative visible cervical spine. Normal bone marrow signal. Sinuses/Orbits: Negative orbits. Paranasal sinuses and mastoids are clear. Other: Grossly normal visible internal auditory structures. IMPRESSION: 1. No acute intracranial abnormality. 2. Moderate for age nonspecific signal changes in the cerebral white matter and pons, most commonly due to chronic small vessel disease. Electronically Signed   By: Genevie Ann M.D.   On: 12/03/2018 19:12   Vas [place] Lower Extremity Venous (dvt)  Result Date: 11/07/2018  Lower Venous Study Indications: Covid +, elevated D-Dimer.  Limitations: Body habitus. Comparison Study: No prior study Performing Technologist: Sharion Dove RVS  Examination Guidelines: A complete evaluation includes B-mode imaging, spectral Doppler, color Doppler, and power Doppler as needed of all accessible portions of each vessel. Bilateral testing is considered an integral part of a complete examination. Limited examinations for reoccurring indications may be performed as noted.  +---------+---------------+---------+-----------+----------+--------------+ RIGHT    CompressibilityPhasicitySpontaneityPropertiesThrombus Aging +---------+---------------+---------+-----------+----------+--------------+ CFV      Full           Yes  Yes                                 +---------+---------------+---------+-----------+----------+--------------+ SFJ      Full                                                        +---------+---------------+---------+-----------+----------+--------------+ FV Prox  Full                                                        +---------+---------------+---------+-----------+----------+--------------+  FV Mid                                                Not visualized +---------+---------------+---------+-----------+----------+--------------+ FV Distal                                             Not visualized +---------+---------------+---------+-----------+----------+--------------+ PFV      Full                                                        +---------+---------------+---------+-----------+----------+--------------+ POP                                                   Not visualized +---------+---------------+---------+-----------+----------+--------------+ PTV                                                   Not visualized +---------+---------------+---------+-----------+----------+--------------+ PERO                                                  Not visualized +---------+---------------+---------+-----------+----------+--------------+   Right Technical Findings: Not visualized segments include mid and distal femoral, popliteal, posterior tibial, and peroneal veins.  +---------+---------------+---------+-----------+----------+-------------------+ LEFT     CompressibilityPhasicitySpontaneityPropertiesThrombus Aging      +---------+---------------+---------+-----------+----------+-------------------+ CFV      Full           Yes      Yes                                      +---------+---------------+---------+-----------+----------+-------------------+ SFJ      Full                                                             +---------+---------------+---------+-----------+----------+-------------------+  FV Prox  Full                                                             +---------+---------------+---------+-----------+----------+-------------------+ FV Mid                                                Not visualized      +---------+---------------+---------+-----------+----------+-------------------+ FV Distal                                              Not visualized      +---------+---------------+---------+-----------+----------+-------------------+ PFV      Full                                                             +---------+---------------+---------+-----------+----------+-------------------+ POP                                                   patent by Color and                                                       Doppler             +---------+---------------+---------+-----------+----------+-------------------+ PTV                                                   Not visualized      +---------+---------------+---------+-----------+----------+-------------------+ PERO                                                  Not visualized      +---------+---------------+---------+-----------+----------+-------------------+   Left Technical Findings: Not visualized segments include mid to distal femoral vein, posterior tibial, and peroneal veins.   Summary: Right: There is no evidence of deep vein thrombosis in the lower extremity. However, portions of this examination were limited- see technologist comments above. Left: There is no evidence of deep vein thrombosis in the lower extremity. However, portions of this examination were limited- see technologist comments above.  *See table(s) above for measurements and observations. Electronically signed by Waverly Ferrari MD on 11/07/2018 at 11:34:51 AM.    Final    [place] Ekg Site Rite  Result Date: 11/05/2018 If Site Rite image not attached, placement could not be confirmed  due to current cardiac rhythm.   Microbiology: Recent Results (from the past 240 hour(s))  SARS CORONAVIRUS 2 (TAT 6-24 HRS) Nasopharyngeal Nasopharyngeal Swab     Status: None   Collection Time: 12/04/18  7:20 AM   Specimen: Nasopharyngeal Swab  Result Value Ref Range Status   SARS Coronavirus 2 NEGATIVE NEGATIVE Final    Comment: (NOTE)  SARS-CoV-2 target nucleic acids are NOT DETECTED. The SARS-CoV-2 RNA is generally detectable in upper and lower respiratory specimens during the acute phase of infection. Negative results do not preclude SARS-CoV-2 infection, do not rule out co-infections with other pathogens, and should not be used as the sole basis for treatment or other patient management decisions. Negative results must be combined with clinical observations, patient history, and epidemiological information. The expected result is Negative. Fact Sheet for Patients: HairSlick.nohttps://www.fda.gov/media/138098/download Fact Sheet for Healthcare Providers: quierodirigir.comhttps://www.fda.gov/media/138095/download This test is not yet approved or cleared by the [place] FDA and  has been authorized for detection and/or diagnosis of SARS-CoV-2 by FDA under an Emergency Use Authorization (EUA). This EUA will remain  in effect (meaning this test can be used) for the duration of the COVID-19 declaration under Section 56 4(b)(1) of the Act, 21 U.S.C. section 360bbb-3(b)(1), unless the authorization is terminated or revoked sooner. Performed at [facility], [address] [place], [place] [address]      Labs: Basic Metabolic Panel: Recent Labs  Lab 12/03/18 1049 12/04/18 0534  NA 143 146*  K 3.8 3.3*  CL 108 107  CO2 28 28  GLUCOSE 105* 78  BUN 11 8  CREATININE 1.16* 1.13*  CALCIUM 8.8* 8.6*  MG 2.0 2.0  PHOS 3.4 3.9   Liver Function Tests: Recent Labs  Lab 12/03/18 1049 12/04/18 0534  AST 20 16  ALT 21 15  ALKPHOS 34* 30*  BILITOT 0.8 0.8  PROT 6.6 5.9*  ALBUMIN 3.5 3.3*   No results for input(s): LIPASE, AMYLASE in the last 168 hours. No results for input(s): AMMONIA in the last 168 hours. CBC: Recent Labs  Lab 12/03/18 1049 12/04/18 0534  WBC 5.2 4.1  NEUTROABS 4.0 2.3  HGB 13.3 12.3  HCT 40.4 38.9  MCV 99.8 103.7*  PLT 159 146*   Cardiac Enzymes: Recent Labs  Lab 12/04/18 0534  CKTOTAL 57    BNP: BNP (last 3 results) Recent Labs    11/04/18 0240 11/05/18 0215 11/06/18 1015  BNP 91.4 73.8 60.3    ProBNP (last 3 results) No results for input(s): PROBNP in the last 8760 hours.  CBG: No results for input(s): GLUCAP in the last 168 hours.  Principal Problem:   Seizures (HCC) Active Problems:   COVID-19 virus infection   Chronic diastolic CHF (congestive heart failure) (HCC)   Hypothyroid   Time coordinating discharge: 38 minutes.  Signed:        Zella Dewan, DO [facility]  12/04/2018, 6:55 PM  ADDENDUM: The patient was not able to go to [facility] on 12/04/2018. This morning I have discussed the patient with her sister/guardian. The patient will be discharged to [facility].

## 2018-12-04 NOTE — ED Notes (Signed)
Pt in scan at this time  

## 2018-12-04 NOTE — Progress Notes (Signed)
eeg completed ° °

## 2018-12-04 NOTE — ED Notes (Signed)
Pt awake and alert at this time, pt comfortable in bed. Tv turned on for pt, pt voices no complaints at this time. Will continue to monitor

## 2018-12-05 DIAGNOSIS — I5032 Chronic diastolic (congestive) heart failure: Secondary | ICD-10-CM

## 2018-12-05 NOTE — TOC Transition Note (Addendum)
Transition of Care [facility]) - CM/SW Discharge Note   Patient Details  Name: Lori Petersen MRN: [id number] Date of Birth: 17-May-1955  Transition of [facility]) CM/SW Contact:  Ross Ludwig, LCSW Phone Number: 12/05/2018, 12:49 PM   Clinical Narrative:     Patient will be discharging back to [facility] today where she is a long term care resident.  Patient has Down Syndrome, and has some minor dementia.  CSW received phone call from Dr. Conley Rolls at the [facility] and she was informed by attending physician that patient is ready for discharge back to [facility].  CSW contacted [facility], and they can accept patient today.  CSW to facilitate discharge planning.  CSW spoke to transportation servivces, and she said [place] program will be picking patient up between 1:15pm and 1:30pm.  CSW gave her the phone number for the nurse's station, she will call when they arrive to pick patient up.  Patient to be d/c'ed today to [facility] [place] room 203A.  Patient and family agreeable to plans will transport via [place] program transportation, RN to call report to [phone].  Patient's sister updated via phone.      Final next level of care: [name] Barriers to Discharge: Barriers Resolved   Patient Goals and CMS Choice Patient states their goals for this hospitalization and ongoing recovery are:: To return back to [facility] [place]. CMS Medicare.gov Compare Post Acute Care list provided to:: Patient Represenative (must comment) Choice offered to / list presented to : [place] / Guardian  Discharge Placement   Existing PASRR number confirmed : 12/05/18          Patient chooses bed at: [facility] [place] Patient to be transferred to facility by: [place] program Name of family member notified: patient's sister Patient and family notified of of transfer: 12/05/18  Discharge Plan and Services  Patient discharging back to [facility] [place] where she is long term care.              DME Arranged: N/A DME Agency: NA       HH Arranged: NA HH Agency: NA        Social Determinants of Health (SDOH) Interventions     Readmission Risk Interventions No flowsheet data found.

## 2018-12-05 NOTE — Progress Notes (Signed)
Laie to be D/C'd Skilled nursing facility ([facility]) per MD order. Patient given discharge teaching and paperwork regarding medications, diet, follow-up appointments and activity. Patient unable to verbalize understanding - report called to nurse at [facility]. No questions or complaints at this time. Skin condition as charted. IV and telemetry removed prior to leaving.  No further needs by Care Management/Social Work. No new prescriptions  An After Visit Summary was printed and given to the patient/facility.   Patient escorted via wheelchair by [facility].  Terrilyn Saver

## 2018-12-05 NOTE — NC FL2 (Signed)
[place] LEVEL OF CARE SCREENING TOOL     IDENTIFICATION  Patient Name: Lori Petersen: [birth year] Sex: female Admission Date (Current Location): 12/03/2018  [place] and [place] Number:  Lori Petersen [id number] [place] and Address:  [facility], [address], [place], [place] [address]      Provider Number: [id number]  Attending Physician Name and Address:  Karie Kirks, DO  Relative Name and Phone Number:  Elray Mcgregor Sister [phone]  [phone]    Current Level of Care: Hospital Recommended Level of Care: [place] Prior Approval Number:    Date Approved/Denied:   PASRR Number: [id number]  Discharge Plan: SNF    Current Diagnoses: Patient Active Problem List   Diagnosis Date Noted  . Seizures ([place]) 12/03/2018  . Obesity, Class III, BMI 40-49.9 (morbid obesity) ([place]) 11/06/2018  . Chronic diastolic CHF (congestive heart failure) ([place]) 11/06/2018  . Respiratory failure, acute (HCC)/ hypoxemic  11/06/2018  . Acute metabolic encephalopathy 98/33/8250  . Hypothyroid 11/06/2018  . COVID-19 virus infection 11/03/2018  . COVID-19   . Acute respiratory failure with hypoxia ([place]) 11/02/2018  . Left tibial fracture 04/27/2017  . Right tibial fracture 04/27/2017  . Problems with swallowing and mastication   . Blood in stool   . Bradycardia 05/14/2015  . 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 Onychomycosis     Orientation RESPIRATION BLADDER Height & Weight     Self  Normal Incontinent Weight: 218 lb 11.2 oz (99.2 kg) Height:  5\' 8"  (172.7 cm)  BEHAVIORAL SYMPTOMS/MOOD NEUROLOGICAL BOWEL NUTRITION STATUS      Continent Diet(Low sodium heart healthy)  AMBULATORY STATUS COMMUNICATION OF NEEDS Skin   Limited Assist Verbally Normal                       Personal Care Assistance Level of Assistance  Bathing, Dressing, Feeding Bathing Assistance: Limited  assistance Feeding assistance: Limited assistance Dressing Assistance: Limited assistance     Functional Limitations Info  Sight, Hearing, Speech Sight Info: Adequate Hearing Info: Adequate Speech Info: Adequate    SPECIAL CARE FACTORS FREQUENCY                       Contractures Contractures Info: Not present    Additional Factors Info  Code Status, Allergies Code Status Info: Full Code Allergies Info: No Known Allergies           Current Medications (12/05/2018):  This is the current hospital active medication list Current Facility-Administered Medications  Medication Dose Route Frequency Provider Last Rate Last Dose  . albuterol (PROVENTIL) (2.5 MG/3ML) 0.083% nebulizer solution 2.5 mg  2.5 mg Inhalation Q4H PRN Swayze, Ava, DO      . aspirin EC tablet 81 mg  81 mg Oral Daily Swayze, Ava, DO   81 mg at 12/04/18 1607  . cholecalciferol (VITAMIN D3) tablet 2,000 Units  2,000 Units Oral Daily Swayze, Ava, DO   2,000 Units at 12/04/18 1607  . lactated ringers infusion   Intravenous Continuous Para Skeans, MD 75 mL/hr at 12/04/18 2256    . latanoprost (XALATAN) 0.005 % ophthalmic solution 1 drop  1 drop Left Eye QHS Swayze, Ava, DO   1 drop at 12/04/18 2309  . levothyroxine (SYNTHROID) tablet 88 mcg  88 mcg Oral QAC breakfast Swayze, Ava, DO   88 mcg at 12/05/18 0535  . memantine (NAMENDA) tablet 10 mg  10 mg Oral BID  Swayze, Ava, DO   10 mg at 12/05/18 0200  . multivitamin with minerals tablet 1 tablet  1 tablet Oral Daily Swayze, Ava, DO   1 tablet at 12/04/18 1607  . pantoprazole (PROTONIX) EC tablet 40 mg  40 mg Oral Daily Swayze, Ava, DO   40 mg at 12/04/18 1607  . pravastatin (PRAVACHOL) tablet 40 mg  40 mg Oral Daily Swayze, Ava, DO   40 mg at 12/04/18 1836  . senna (SENOKOT) tablet 17.2 mg  2 tablet Oral QHS Swayze, Ava, DO         Discharge Medications: Please see discharge summary for a list of discharge medications.  Relevant Imaging  Results:  Relevant Lab Results:   Additional Information SSN [id number]  Darleene Cleaver, [place]

## 2018-12-05 NOTE — Progress Notes (Signed)
Report called to nurse at [facility]. Waiting to hear from [facility] for transport. Randall Hiss, [place] coordinating.

## 2018-12-05 NOTE — Progress Notes (Addendum)
Contacted by Sherlynn Stalls with Infection Disease Prevention. Instructed to take patient off of precautions as she had a negative COVID test.

## 2018-12-05 NOTE — Progress Notes (Signed)
  PROGRESS NOTE  [place] YIF:[id number] DOB: May 20, 1955 DOA: 12/03/2018 PCP: [facility] [place]  Brief History   The patient was roomed in the ED awaiting a bed upstairs. CT head was negative. EEG was ordered. If it is negative the patient will be discharged to her long term care center.   Consultants  . None  Procedures  . EEG pending.  Antibiotics   Anti-infectives (From admission, onward)   None    .  Subjective  The patient is resting comfortably. No new complaints.  Objective   Vitals:  Vitals:   12/05/18 0537 12/05/18 0759  BP: (!) 107/91 134/85  Pulse: 84 (!) 58  Resp:  18  Temp: 98.6 F (37 C) 98.7 F (37.1 C)  SpO2: 94% 97%   Exam:  Constitutional:  . The patient is awake and alert. No acute distress. Respiratory:  . No increased work of breathing. . No wheezes, rales, or rhonchi . No tactile fremitus Cardiovascular:  . Regular rate and rhythm . No murmurs, ectopy, or gallups. . No lateral PMI. No thrills. Abdomen:  . Abdomen is soft, non-tender, non-distended . No hernias, masses, or organomegaly . Normoactive bowel sounds.  Musculoskeletal:  . No cyanosis, clubbing, or edema Skin:  . No rashes, lesions, ulcers . palpation of skin: no induration or nodules Neurologic:  . CN 2-12 intact . Patient is moving all extremities Psychiatric:  . Mental status o Mood, affect appropriate  I have personally reviewed the following:   Today's Data  . Vitals, CMP, CBC  Micro Data  . Blood cultures: no growth. .  Other Data  . EEG pending.  Scheduled Meds: . aspirin EC  81 mg Oral Daily  . cholecalciferol  2,000 Units Oral Daily  . latanoprost  1 drop Left Eye QHS  . levothyroxine  88 mcg Oral QAC breakfast  . memantine  10 mg Oral BID  . multivitamin with minerals  1 tablet Oral Daily  . pantoprazole  40 mg Oral Daily  . pravastatin  40 mg Oral Daily  . senna  2 tablet Oral QHS   Continuous Infusions: . lactated  ringers 75 mL/hr at 12/04/18 2256    Principal Problem:   Seizures ([place]) Active Problems:   COVID-19 virus infection   Chronic diastolic CHF (congestive heart failure) ([place])   Hypothyroid   LOS: 0 days   A & P  Seizures: New onset seizures. Isolated 1x seiaure. EEG ordered and results pending. Patient has been started on PRN ativan and keppra. Keppra will not be continued unless EEG demonstrates seizure disorder. She will be discharged back to her long term care facility.  Chronic diastolic CHF: Stable, pt is euvolemic. Monitor volume status.  Hypothyroidism: Continue Levothyroxine 84mcg as at home.  Recent covid-19 infection a month ago: Repeat covid today was negative. The patient has no pulmonary symptoms.  I have seen and examined this patient myself.  DVT prophylaxis: Heparin  Code Status: full code  Family Communication: None at bedside  Disposition Plan: Return to long term care facility if EEG is negative.   Rainee Sweatt, DO [facility] Direct contact: see [email or web address]  7PM-7AM contact night coverage as above 12/04/2018, 12:48 PM  LOS: 0 days

## 2018-12-14 LAB — LYME DISEASE, WESTERN BLOT
IgG P18 Ab.: ABSENT
IgG P23 Ab.: ABSENT
IgG P30 Ab.: ABSENT
IgG P39 Ab.: ABSENT
IgG P41 Ab.: ABSENT
IgG P45 Ab.: ABSENT
IgG P58 Ab.: ABSENT
IgG P66 Ab.: ABSENT
IgG P93 Ab.: ABSENT
IgM P23 Ab.: ABSENT
IgM P39 Ab.: ABSENT
IgM P41 Ab.: ABSENT
Lyme IgG Wb: NEGATIVE
Lyme IgM Wb: NEGATIVE

## 2019-11-21 ENCOUNTER — Emergency Department
Admission: EM | Admit: 2019-11-21 | Discharge: 2019-11-21 | Disposition: A | Discharge status: Home / Self Care | Payer: Medicare (Managed Care) | Attending: Emergency Medicine | Admitting: Emergency Medicine

## 2019-11-21 ENCOUNTER — Encounter: Payer: Self-pay | Admitting: Emergency Medicine

## 2019-11-21 ENCOUNTER — Emergency Department: Payer: Medicare (Managed Care)

## 2019-11-21 ENCOUNTER — Other Ambulatory Visit: Payer: Self-pay

## 2019-11-21 DIAGNOSIS — R569 Unspecified convulsions: Secondary | ICD-10-CM

## 2019-11-21 DIAGNOSIS — Z8616 Personal history of COVID-19: Secondary | ICD-10-CM | POA: Diagnosis not present

## 2019-11-21 DIAGNOSIS — I5032 Chronic diastolic (congestive) heart failure: Secondary | ICD-10-CM | POA: Diagnosis not present

## 2019-11-21 DIAGNOSIS — F0390 Unspecified dementia without behavioral disturbance: Secondary | ICD-10-CM | POA: Insufficient documentation

## 2019-11-21 DIAGNOSIS — Z7982 Long term (current) use of aspirin: Secondary | ICD-10-CM | POA: Insufficient documentation

## 2019-11-21 DIAGNOSIS — J45909 Unspecified asthma, uncomplicated: Secondary | ICD-10-CM | POA: Insufficient documentation

## 2019-11-21 DIAGNOSIS — E039 Hypothyroidism, unspecified: Secondary | ICD-10-CM | POA: Diagnosis not present

## 2019-11-21 HISTORY — DX: Unspecified convulsions: R56.9

## 2019-11-21 HISTORY — DX: Unspecified dementia, unspecified severity, without behavioral disturbance, psychotic disturbance, mood disturbance, and anxiety: F03.90

## 2019-11-21 LAB — BASIC METABOLIC PANEL
Anion gap: 11 (ref 5–15)
BUN: 18 mg/dL (ref 8–23)
CO2: 26 mmol/L (ref 22–32)
Calcium: 8.2 mg/dL — ABNORMAL LOW (ref 8.9–10.3)
Chloride: 104 mmol/L (ref 98–111)
Creatinine, Ser: 1.15 mg/dL — ABNORMAL HIGH (ref 0.44–1.00)
GFR, Estimated: 54 mL/min — ABNORMAL LOW (ref >60)
Glucose, Bld: 99 mg/dL (ref 70–99)
Potassium: 4.1 mmol/L (ref 3.5–5.1)
Sodium: 141 mmol/L (ref 135–145)

## 2019-11-21 LAB — CBC WITH DIFFERENTIAL/PLATELET
Abs Immature Granulocytes: 0.06 10*3/uL (ref 0.00–0.07)
Basophils Absolute: 0.1 10*3/uL (ref 0.0–0.1)
Basophils Relative: 1 %
Eosinophils Absolute: 0 10*3/uL (ref 0.0–0.5)
Eosinophils Relative: 1 %
HCT: 43.4 % (ref 36.0–46.0)
Hemoglobin: 13.3 g/dL (ref 12.0–15.0)
Immature Granulocytes: 1 %
Lymphocytes Relative: 12 %
Lymphs Abs: 0.9 10*3/uL (ref 0.7–4.0)
MCH: 30.1 pg (ref 26.0–34.0)
MCHC: 30.6 g/dL (ref 30.0–36.0)
MCV: 98.2 fL (ref 80.0–100.0)
Monocytes Absolute: 0.4 10*3/uL (ref 0.1–1.0)
Monocytes Relative: 5 %
Neutro Abs: 6 10*3/uL (ref 1.7–7.7)
Neutrophils Relative %: 80 %
Platelets: 226 10*3/uL (ref 150–400)
RBC: 4.42 MIL/uL (ref 3.87–5.11)
RDW: 16.3 % — ABNORMAL HIGH (ref 11.5–15.5)
WBC: 7.5 10*3/uL (ref 4.0–10.5)
nRBC: 0 % (ref 0.0–0.2)

## 2019-11-21 LAB — URINALYSIS, ROUTINE W REFLEX MICROSCOPIC
Bilirubin Urine: NEGATIVE
Glucose, UA: NEGATIVE mg/dL
Hgb urine dipstick: NEGATIVE
Ketones, ur: NEGATIVE mg/dL
Leukocytes,Ua: NEGATIVE
Nitrite: NEGATIVE
Protein, ur: NEGATIVE mg/dL
Specific Gravity, Urine: 1.014 (ref 1.005–1.030)
pH: 8 (ref 5.0–8.0)

## 2019-11-21 MED ORDER — LEVETIRACETAM IN NACL 1000 MG/100ML IV SOLN
1000.0000 mg | Freq: Once | INTRAVENOUS | Status: AC
  Administered 2019-11-21: 1000 mg via INTRAVENOUS
  Filled 2019-11-21: qty 100

## 2019-11-21 MED ORDER — LEVETIRACETAM 500 MG PO TABS: 500.0000 mg | ORAL_TABLET | Freq: Two times a day (BID) | ORAL | 0 refills | Status: DC

## 2019-11-21 MED ORDER — SODIUM CHLORIDE 0.9 % IV BOLUS
1000.0000 mL | Freq: Once | INTRAVENOUS | Status: AC
  Administered 2019-11-21: 1000 mL via INTRAVENOUS

## 2019-11-21 NOTE — Discharge Instructions (Addendum)
Your lab tests and urine test were all normal.

## 2019-11-21 NOTE — ED Notes (Signed)
Pt sister at bedside °

## 2019-11-21 NOTE — ED Notes (Signed)
Pt unable to sign d/t baseline mental status 

## 2019-11-21 NOTE — TOC Progression Note (Signed)
Transition of Care [facility]) - Progression Note    Patient Details  Name: Lori Petersen [facility] MRN: [id number] Date of Birth: [birth year]  Transition of Care [facility]) CM/SW Contact  Varina Cellar, RN Phone Number: 11/21/2019, 4:11 PM  Clinical Narrative:    Received call from April with [facility] @ [phone] confirming patient can be returned to [facility] via EMS.         Expected Discharge Plan and Services                                                 Social Determinants of Health (SDOH) Interventions    Readmission Risk Interventions No flowsheet data found.

## 2019-11-21 NOTE — ED Notes (Signed)
Lab at bedside

## 2019-11-21 NOTE — ED Notes (Signed)
Pt taken to CT.

## 2019-11-21 NOTE — TOC Progression Note (Addendum)
Transition of Care [facility]) - Progression Note    Patient Details  Name: Lori Petersen [facility] MRN: [id number] Date of Birth: 08/22/1955  Transition of Care [facility]) CM/SW Contact  Perry Cellar, RN Phone Number: 11/21/2019, 12:53 PM  Clinical Narrative:    Received call from April with [facility] @ [phone] states to contact [facility] when patient cleared for discharge and [facility] will set up transportation back to [facility].   2:53pm: Spoke to April who advised [facility] would be responsible for transport back to facility once medically cleared.         Expected Discharge Plan and Services                                                 Social Determinants of Health (SDOH) Interventions    Readmission Risk Interventions No flowsheet data found.

## 2019-11-21 NOTE — ED Notes (Signed)
Attempted to call report to SNF, unable to reach anyone

## 2019-11-21 NOTE — ED Provider Notes (Signed)
[facility] Emergency Department Provider Note   ____________________________________________   First MD Initiated Contact with Patient 11/21/19 0913     (approximate)  I have reviewed the triage vital signs and the nursing notes.   HISTORY  Chief Complaint Seizures    HPI Lori Petersen is a 64 y.o. female with a past medical history of Down syndrome, chronic intellectual developmental delay, hypothyroidism, and dementia who presents via EMS after an episode of seizure-like activity from [facility].  Patient unable to participate in history or review of systems.  History obtained from EMS and primary care physician's note that was sent with patient.  States that patient was recently diagnosed with Covid 1 year ago and had 1 episode of 1 grand mal seizure while she was recovering from Covid pneumonia.  Patient has had no further seizure activity since that time.  Has not been on any antiepileptic medications.  No further seizure activity since this episode this morning         Past Medical History:  Diagnosis Date  . Arthritis    lower spine  . Asthma   . Dementia (HCC)   . Diastolic congestive heart failure (HCC)    "only has problems with bad colds"  . Down's syndrome   . Full dentures    does not wear  . GERD (gastroesophageal reflux disease)   . Hypothyroidism   . Onychomycosis   . Seizures [facility])     Patient Active Problem List   Diagnosis Date Noted  . Seizures (HCC) 12/03/2018  . Obesity, Class III, 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  . Chronic diastolic CHF (congestive heart failure) (HCC) 11/06/2018  . Respiratory failure, acute (HCC)/ hypoxemic  11/06/2018  . Acute metabolic encephalopathy 11/06/2018  . Hypothyroid 11/06/2018  . COVID-19 virus infection 11/03/2018  . COVID-19   . Acute respiratory failure with hypoxia (HCC) 11/02/2018  . Left tibial fracture 04/27/2017  . Right tibial fracture 04/27/2017  . Problems with  swallowing and mastication   . Blood in stool   . Bradycardia 05/14/2015  . 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 Onychomycosis     Past Surgical History:  Procedure Laterality Date  . BREAST BIOPSY Left 04/05/2016   neg. cylinder clip  . BREAST BIOPSY Left 12/06/2016   ribbon clip. path pending  . COLONOSCOPY WITH PROPOFOL N/A 04/24/2016   Procedure: COLONOSCOPY WITH PROPOFOL;  Surgeon: Midge Minium, MD;  Location: [facility];  Service: Endoscopy;  Laterality: N/A;  Leave patient at 10:25 due to transportation  . ESOPHAGOGASTRODUODENOSCOPY (EGD) WITH PROPOFOL N/A 04/24/2016   Procedure: ESOPHAGOGASTRODUODENOSCOPY (EGD) WITH PROPOFOL;  Surgeon: Midge Minium, MD;  Location: [facility];  Service: Endoscopy;  Laterality: N/A;  . HERNIA REPAIR      Prior to Admission medications   Medication Sig Start Date End Date Taking? Authorizing Provider  albuterol (VENTOLIN HFA) 108 (90 Base) MCG/ACT inhaler Inhale 1 puff into the lungs every 4 (four) hours as needed for wheezing or shortness of breath.    [provider]  aspirin EC 81 MG tablet Take 81 mg by mouth daily.    [provider]  cholecalciferol (VITAMIN D3) 25 MCG (1000 UT) tablet Take 2,000 Units by mouth daily.    [provider]  latanoprost (XALATAN) 0.005 % ophthalmic solution Place 1 drop into the left eye at bedtime.    [provider]  levETIRAcetam (KEPPRA) 500 MG tablet Take 1 tablet (500 mg  total) by mouth 2 (two) times daily. 11/21/19 12/21/19  Merwyn Katos, MD  levothyroxine (SYNTHROID) 88 MCG tablet Take 88 mcg by mouth daily before breakfast.    [provider]  memantine (NAMENDA) 10 MG tablet Take 10 mg by mouth 2 (two) times daily.    [provider]  Multiple Vitamin (MULTIVITAMIN WITH MINERALS) TABS tablet Take 1 tablet by mouth daily.    [provider]  omeprazole (PRILOSEC) 20 MG capsule Take 20 mg by mouth daily.     [provider]  pravastatin (PRAVACHOL) 40 MG tablet Take 40 mg by mouth daily.    [provider]  senna (SENOKOT) 8.6 MG TABS tablet Take 2 tablets by mouth at bedtime.    [provider]    Allergies Patient has no known allergies.  No family history on file.  Social History Social History   Tobacco Use  . Smoking status: Never Smoker  . Smokeless tobacco: Never Used  Substance Use Topics  . Alcohol use: No  . Drug use: Not Currently    Review of Systems Unable to assess  ____________________________________________   PHYSICAL EXAM:  VITAL SIGNS: ED Triage Vitals  Enc Vitals Group     BP 11/21/19 0918 127/88     Pulse Rate 11/21/19 0918 81     Resp 11/21/19 0918 20     Temp 11/21/19 0918 98.4 F (36.9 C)     Temp Source 11/21/19 0918 Oral     SpO2 11/21/19 0918 96 %     Weight --      Height --      Head Circumference --      [facility] Flow --      Pain Score 11/21/19 0915 0     Pain Loc --      Pain Edu? --      Excl. in GC? --    Constitutional: Alert and oriented only to self.  At baseline.  Well appearing and in no acute distress. Eyes: Conjunctivae are normal. PERRL. Head: Atraumatic. Nose: No congestion/rhinnorhea. Mouth/Throat: Mucous membranes are moist. Neck: No stridor Cardiovascular: Grossly normal heart sounds.  Good peripheral circulation. Respiratory: Normal respiratory effort.  No retractions. Gastrointestinal: Soft and nontender. No distention. Musculoskeletal: No obvious deformities Neurologic:  Normal speech and language. No gross focal neurologic deficits are appreciated. Skin:  Skin is warm and dry. No rash noted. Psychiatric: Mood and affect are normal. Speech and behavior are normal.  ____________________________________________   LABS (all labs ordered are listed, but only abnormal results are displayed)  Labs Reviewed  BASIC METABOLIC PANEL - Abnormal; Notable for the following components:       Result Value   Creatinine, Ser 1.15 (*)    Calcium 8.2 (*)    GFR, Estimated 54 (*)    All other components within normal limits  CBC WITH DIFFERENTIAL/PLATELET - Abnormal; Notable for the following components:   RDW 16.3 (*)    All other components within normal limits  URINALYSIS, ROUTINE W REFLEX MICROSCOPIC  CBG MONITORING, ED  CBG MONITORING, ED   ____________________________________________  EKG  ED ECG REPORT I, Merwyn Katos, the attending physician, personally viewed and interpreted this ECG.  Date: 11/21/2019 EKG Time: 0919 Rate: 77 Rhythm: normal sinus rhythm QRS Axis: normal Intervals: normal ST/T Wave abnormalities: normal Narrative Interpretation: no evidence of acute ischemia  ____________________________________________  RADIOLOGY  ED MD interpretation: CT without contrast of the head shows no evidence of acute abnormalities but  does show chronic microvascular ischemic disease and generalized atrophy.  No evidence of acute hemorrhage or fractures  Official radiology report(s): CT HEAD WO CONTRAST  Result Date: 11/21/2019 CLINICAL DATA:  Seizure. EXAM: CT HEAD WITHOUT CONTRAST TECHNIQUE: Contiguous axial images were obtained from the base of the skull through the vertex without intravenous contrast. COMPARISON:  MRI and CT December 03, 2018 FINDINGS: Brain: No evidence of acute large vascular territory infarction, hemorrhage, hydrocephalus, extra-axial collection or mass lesion/mass effect. Similar patchy white matter hypoattenuation, most likely related to chronic microvascular ischemic disease and better characterized on prior MRI. Similar mild generalized cerebral atrophy. Similar empty and expanded sella. Vascular: Calcific atherosclerosis. Skull: No acute fracture. Sinuses/Orbits: No acute findings. Other: No mastoid effusions. IMPRESSION: 1. No evidence of acute intracranial abnormality. 2. Similar presumed chronic microvascular ischemic disease and  generalized atrophy. 3. Similar expanded and partially empty sella. This finding is often incidental, but can be seen with idiopathic intracranial hypertension in the correct clinical setting. Electronically Signed   By: Feliberto Harts MD   On: 11/21/2019 10:14    ____________________________________________   PROCEDURES  Procedure(s) performed (including Critical Care):  Procedures   ____________________________________________   INITIAL IMPRESSION / ASSESSMENT AND PLAN / ED COURSE  As part of my medical decision making, I reviewed the following data within the electronic MEDICAL RECORD NUMBER Nursing notes reviewed and incorporated, Labs reviewed, EKG interpreted, Old chart reviewed, Radiograph reviewed and Notes from prior ED visits reviewed and incorporated        Patient presents after recent seizure episode.  Patient had slow return to baseline mental and physical function per EMS. No immunosuppresion hx and had no preceding fever. No history of alcohol abuse or suspicion for toxin ingestion. Unlikely stroke, syncope. Unlikely infectious etiology. No preceding trauma.  Workup: EKG, BMP, POCT glucose (pregnancy test if female) and CT Brain.  Field Interventions: None ED Interventions: 1 g Keppra load Disposition: Discharge home with primary care follow up in next 24-48 hours.      ____________________________________________   FINAL CLINICAL IMPRESSION(S) / ED DIAGNOSES  Final diagnoses:  Seizure-like activity [facility])     ED Discharge Orders         Ordered    levETIRAcetam (KEPPRA) 500 MG tablet  2 times daily        11/21/19 1411           Note:  This document was prepared using Dragon voice recognition software and may include unintentional dictation errors.   Merwyn Katos, MD 11/22/19 [phone]

## 2019-11-21 NOTE — ED Notes (Signed)
Lab came to draw blood while pt in CT, lab will come back

## 2019-11-21 NOTE — ED Notes (Signed)
Pt returned from CT °

## 2019-11-21 NOTE — ED Notes (Signed)
Patient sister updated

## 2019-11-21 NOTE — ED Notes (Signed)
Pt linens changed

## 2019-11-21 NOTE — ED Triage Notes (Signed)
Pt to ED via [facility] from [facility] for seizure. Per EMS tech found pt having seizure and reported that it lasted for 10-15 minutes. Last seizure 6 months ago. Pt is alert on arrival. Pt is A & O at baseline. Per EMS pt post ictal with them. Pt has hx/o Down Syndrome and Vascular Dementia. VSS with EMS. Pt has MOST form.

## 2019-11-21 NOTE — ED Notes (Signed)
Marisue Ivan, RN attempted blood draw x 2 without success. Will call lab to draw blood.

## 2019-11-21 NOTE — ED Notes (Signed)
Called [facility] for transport to [facility]  [phone]

## 2019-11-21 NOTE — ED Notes (Signed)
Called and Spoke with Archie Patten in the lab, they will send someone to get labs on patient.

## 2020-05-04 ENCOUNTER — Other Ambulatory Visit: Payer: Self-pay | Admitting: Neurology

## 2020-05-04 DIAGNOSIS — R404 Transient alteration of awareness: Secondary | ICD-10-CM

## 2020-05-07 ENCOUNTER — Ambulatory Visit: Payer: Medicare (Managed Care)

## 2020-05-10 ENCOUNTER — Ambulatory Visit
Admission: RE | Admit: 2020-05-10 | Discharge: 2020-05-10 | Disposition: A | Discharge status: Home / Self Care | Payer: Medicare (Managed Care) | Source: Ambulatory Visit | Attending: Neurology | Admitting: Neurology

## 2020-05-10 ENCOUNTER — Other Ambulatory Visit: Payer: Self-pay

## 2020-05-10 DIAGNOSIS — R404 Transient alteration of awareness: Secondary | ICD-10-CM | POA: Insufficient documentation

## 2021-01-04 IMAGING — CT CT HEAD W/O CM
3 series · 15 of 47 positions shown, 18 images · non-contrast
Comparison: CT head 02/22/2013

CLINICAL DATA: Altered level of consciousness. New onset seizure.
History of [id number] last month

EXAM:
CT HEAD WITHOUT CONTRAST
TECHNIQUE: Contiguous axial images were obtained from the base of the skull
through the vertex without intravenous contrast.

[Series 2: head wo · axial · 0.39mm/px · z∈[+338,+463]mm · 9 of 30 slices shown, 12 images]
[im 3/30  brain]
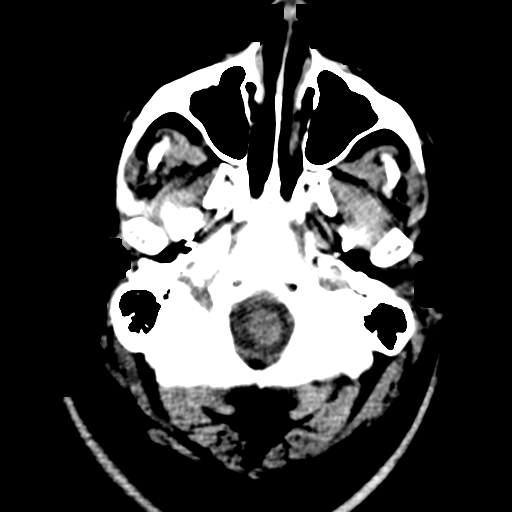
[im 3/30  bone]
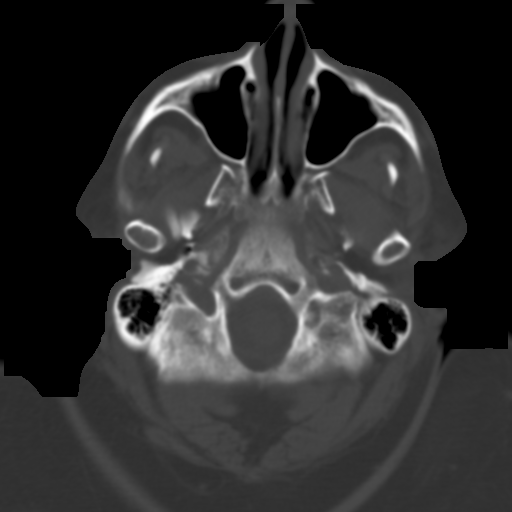
[im 6/30  brain]
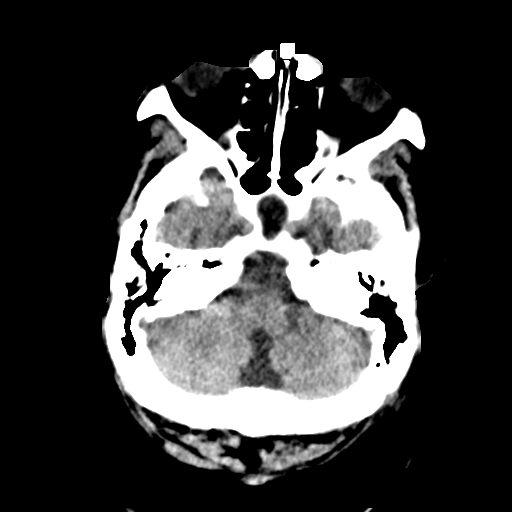
[im 9/30  brain]
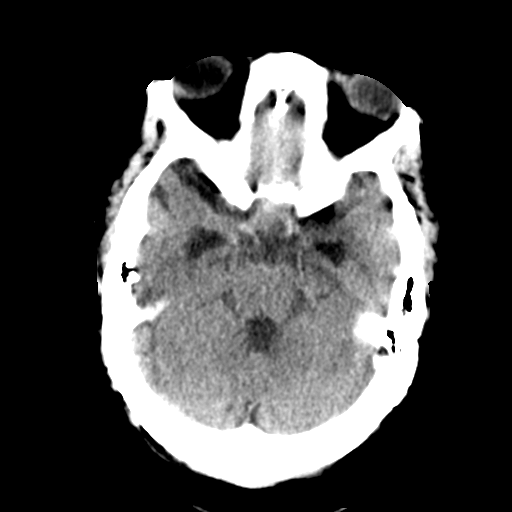
[im 12/30  brain]
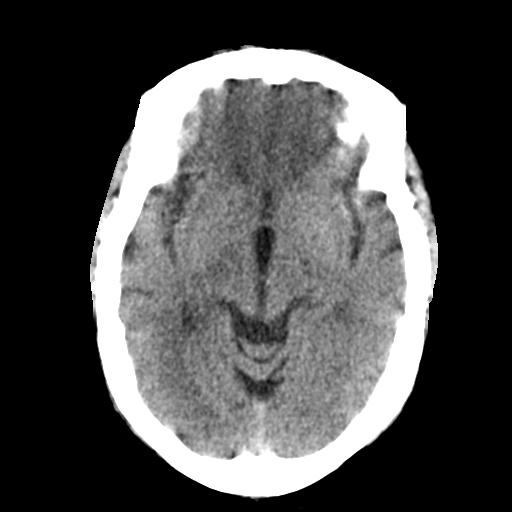
[im 16/30  brain]
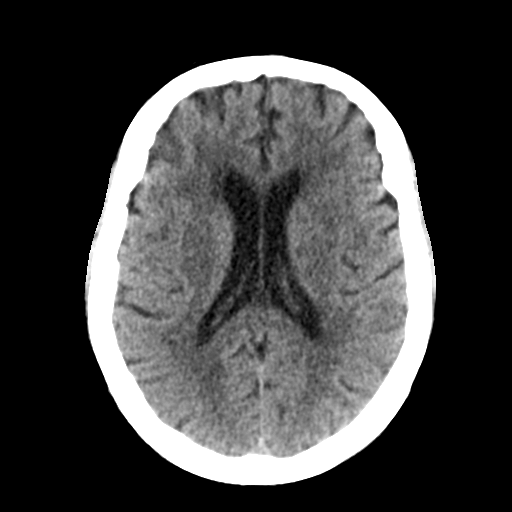
[im 16/30  bone]
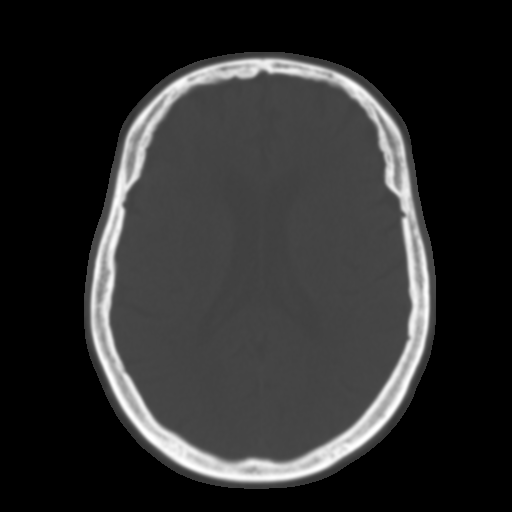
[im 19/30  brain]
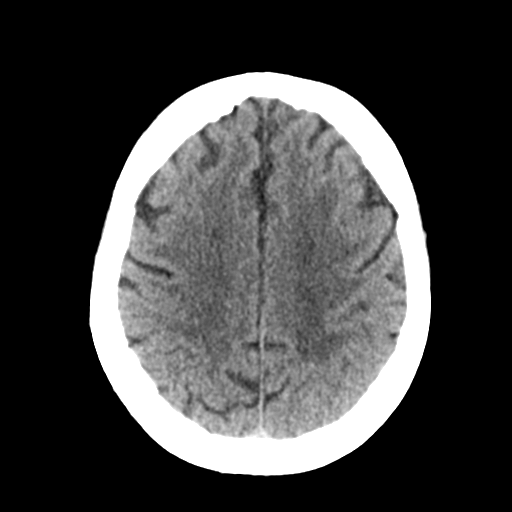
[im 22/30  brain]
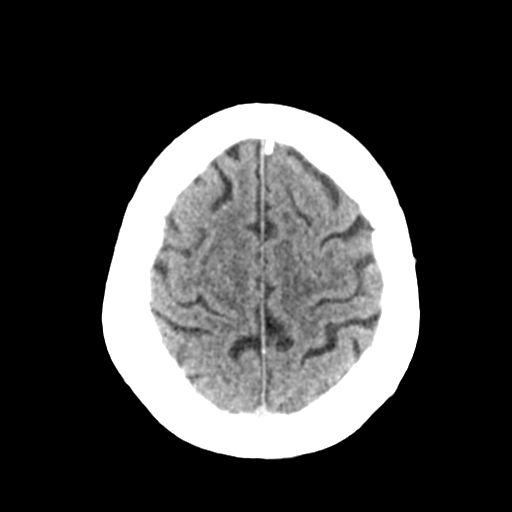
[im 25/30  brain]
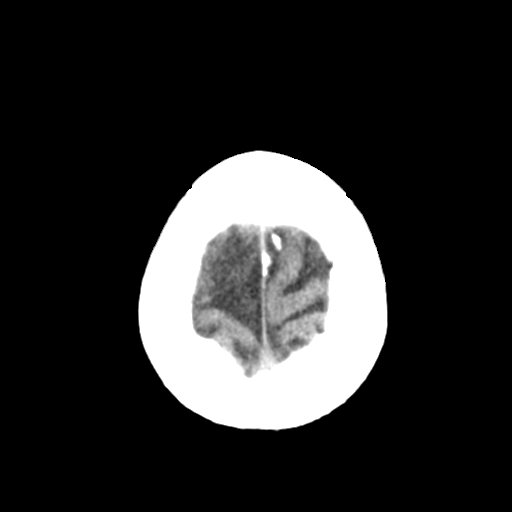
[im 28/30  brain]
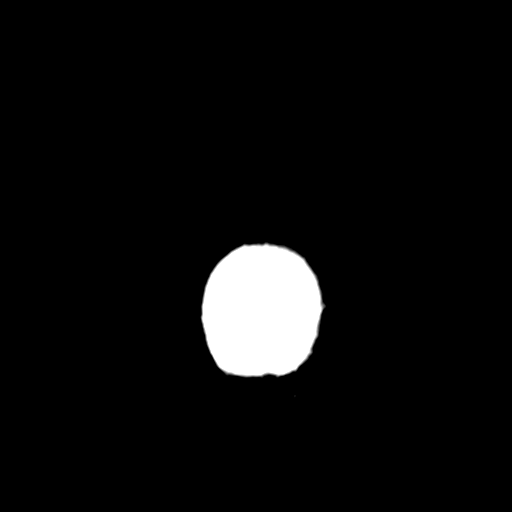
[im 28/30  bone]
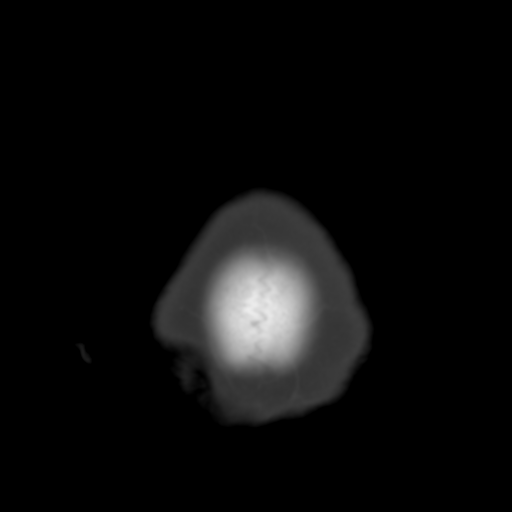

[Series 4: coronal soft tissue · coronal · 0.28mm/px · 3 of 71 slices shown]
[im 24/71  brain]
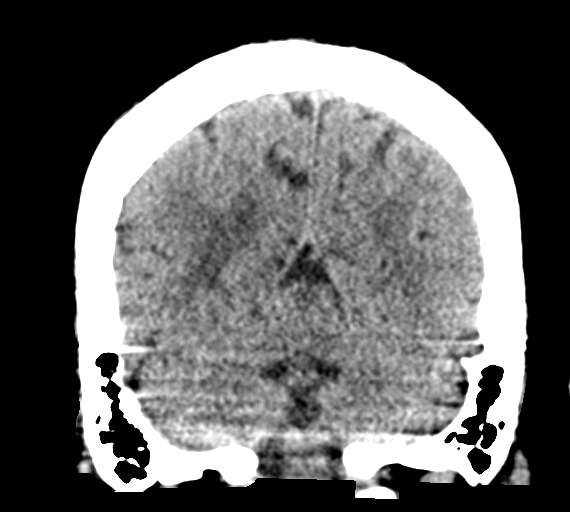
[im 32/71  brain]
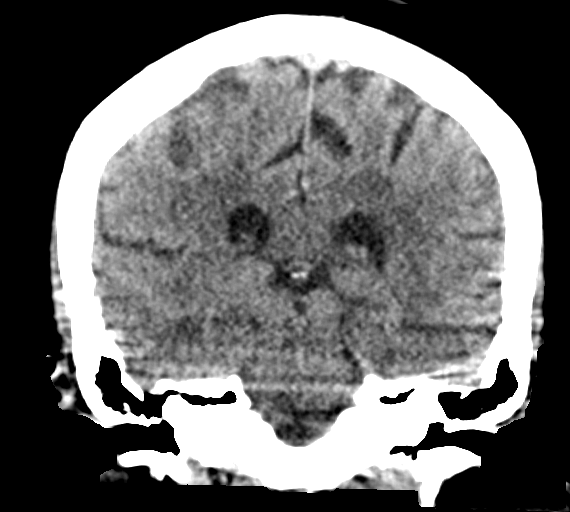
[im 39/71  brain]
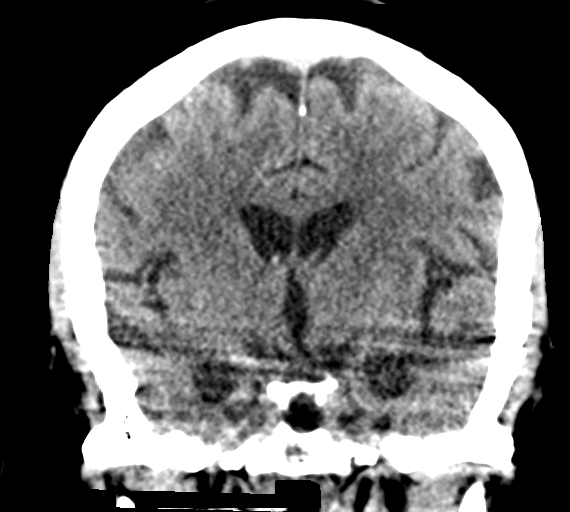

[Series 5: sagittal soft tissue · sagittal · 0.27mm/px · 3 of 54 slices shown]
[im 18/54  brain]
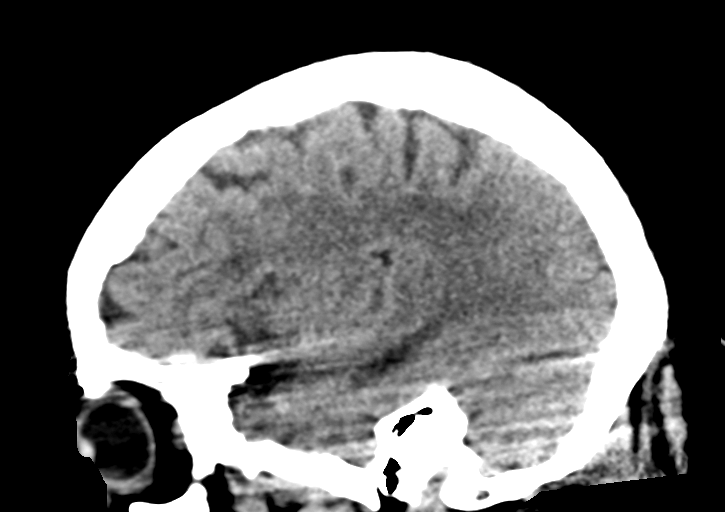
[im 27/54  brain]
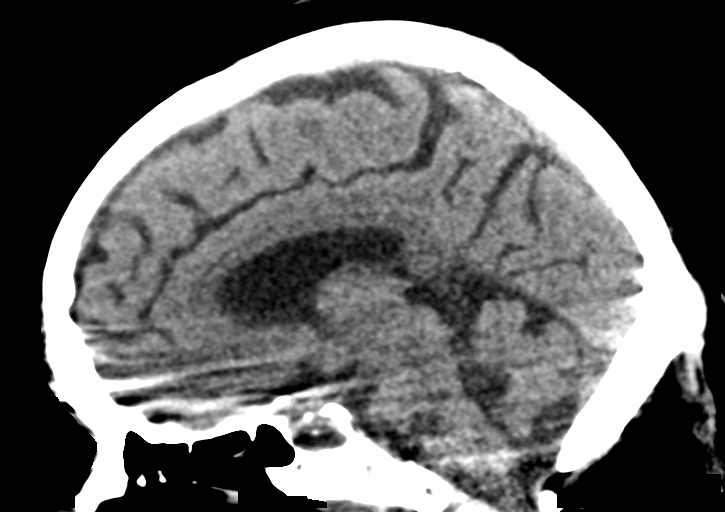
[im 36/54  brain]
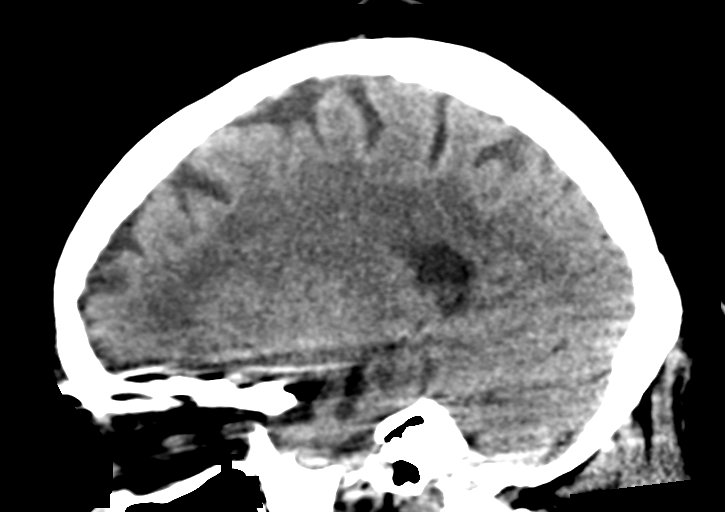

[15 of 47 positions shown; findings below may reference images not displayed]

FINDINGS: Brain: Motion degraded study.

Progressive atrophy. Progressive white matter disease since 6121.
Diffuse white matter hypodensity most consistent with microvascular
ischemia.

Negative for acute cortical infarct. Negative for hemorrhage or
mass.

Vascular: Negative for hyperdense vessel

Skull: Negative

Sinuses/Orbits: Negative

Other: None
IMPRESSION: No acute intracranial abnormality

Motion degraded study

Progression of atrophy and chronic microvascular ischemia since [DATE]

## 2021-01-16 DIAGNOSIS — L8990 Pressure ulcer of unspecified site, unspecified stage: Secondary | ICD-10-CM

## 2021-01-16 HISTORY — DX: Pressure ulcer of unspecified site, unspecified stage: L89.90

## 2021-03-24 ENCOUNTER — Emergency Department: Payer: Medicare (Managed Care)

## 2021-03-24 ENCOUNTER — Encounter
Admission: EM | Disposition: A | Discharge status: Skilled Nursing Facility | Payer: Self-pay | Source: Skilled Nursing Facility | Attending: Internal Medicine

## 2021-03-24 ENCOUNTER — Inpatient Hospital Stay: Payer: Medicare (Managed Care)

## 2021-03-24 ENCOUNTER — Inpatient Hospital Stay
Admission: EM | Admit: 2021-03-24 | Discharge: 2021-03-29 | DRG: 853 | Disposition: A | Discharge status: Skilled Nursing Facility | Payer: Medicare (Managed Care) | Source: Skilled Nursing Facility | Attending: Internal Medicine | Admitting: Internal Medicine

## 2021-03-24 ENCOUNTER — Other Ambulatory Visit: Payer: Self-pay

## 2021-03-24 ENCOUNTER — Inpatient Hospital Stay: Payer: Medicare (Managed Care) | Admitting: Certified Registered Nurse Anesthetist

## 2021-03-24 DIAGNOSIS — I5032 Chronic diastolic (congestive) heart failure: Secondary | ICD-10-CM | POA: Diagnosis present

## 2021-03-24 DIAGNOSIS — J690 Pneumonitis due to inhalation of food and vomit: Secondary | ICD-10-CM | POA: Diagnosis present

## 2021-03-24 DIAGNOSIS — J4520 Mild intermittent asthma, uncomplicated: Secondary | ICD-10-CM | POA: Diagnosis present

## 2021-03-24 DIAGNOSIS — Z20822 Contact with and (suspected) exposure to covid-19: Secondary | ICD-10-CM | POA: Diagnosis present

## 2021-03-24 DIAGNOSIS — F0150 Vascular dementia without behavioral disturbance: Secondary | ICD-10-CM | POA: Diagnosis present

## 2021-03-24 DIAGNOSIS — E870 Hyperosmolality and hypernatremia: Secondary | ICD-10-CM | POA: Diagnosis present

## 2021-03-24 DIAGNOSIS — E669 Obesity, unspecified: Secondary | ICD-10-CM | POA: Diagnosis present

## 2021-03-24 DIAGNOSIS — G309 Alzheimer's disease, unspecified: Secondary | ICD-10-CM | POA: Diagnosis present

## 2021-03-24 DIAGNOSIS — E869 Volume depletion, unspecified: Secondary | ICD-10-CM | POA: Diagnosis present

## 2021-03-24 DIAGNOSIS — G4730 Sleep apnea, unspecified: Secondary | ICD-10-CM | POA: Diagnosis present

## 2021-03-24 DIAGNOSIS — G40909 Epilepsy, unspecified, not intractable, without status epilepticus: Secondary | ICD-10-CM | POA: Diagnosis present

## 2021-03-24 DIAGNOSIS — N390 Urinary tract infection, site not specified: Secondary | ICD-10-CM | POA: Diagnosis not present

## 2021-03-24 DIAGNOSIS — M1990 Unspecified osteoarthritis, unspecified site: Secondary | ICD-10-CM | POA: Diagnosis present

## 2021-03-24 DIAGNOSIS — D696 Thrombocytopenia, unspecified: Secondary | ICD-10-CM | POA: Diagnosis not present

## 2021-03-24 DIAGNOSIS — E860 Dehydration: Secondary | ICD-10-CM | POA: Diagnosis present

## 2021-03-24 DIAGNOSIS — J189 Pneumonia, unspecified organism: Secondary | ICD-10-CM | POA: Diagnosis present

## 2021-03-24 DIAGNOSIS — R112 Nausea with vomiting, unspecified: Secondary | ICD-10-CM | POA: Diagnosis present

## 2021-03-24 DIAGNOSIS — Z79890 Hormone replacement therapy (postmenopausal): Secondary | ICD-10-CM

## 2021-03-24 DIAGNOSIS — N179 Acute kidney failure, unspecified: Secondary | ICD-10-CM | POA: Diagnosis present

## 2021-03-24 DIAGNOSIS — N201 Calculus of ureter: Secondary | ICD-10-CM | POA: Diagnosis present

## 2021-03-24 DIAGNOSIS — E785 Hyperlipidemia, unspecified: Secondary | ICD-10-CM | POA: Diagnosis present

## 2021-03-24 DIAGNOSIS — E861 Hypovolemia: Secondary | ICD-10-CM | POA: Diagnosis present

## 2021-03-24 DIAGNOSIS — Q909 Down syndrome, unspecified: Secondary | ICD-10-CM | POA: Diagnosis not present

## 2021-03-24 DIAGNOSIS — J9601 Acute respiratory failure with hypoxia: Secondary | ICD-10-CM | POA: Diagnosis present

## 2021-03-24 DIAGNOSIS — N1330 Unspecified hydronephrosis: Secondary | ICD-10-CM

## 2021-03-24 DIAGNOSIS — R0902 Hypoxemia: Secondary | ICD-10-CM

## 2021-03-24 DIAGNOSIS — Z7982 Long term (current) use of aspirin: Secondary | ICD-10-CM

## 2021-03-24 DIAGNOSIS — R569 Unspecified convulsions: Secondary | ICD-10-CM

## 2021-03-24 DIAGNOSIS — N136 Pyonephrosis: Secondary | ICD-10-CM | POA: Diagnosis present

## 2021-03-24 DIAGNOSIS — R6521 Severe sepsis with septic shock: Secondary | ICD-10-CM | POA: Diagnosis present

## 2021-03-24 DIAGNOSIS — A419 Sepsis, unspecified organism: Secondary | ICD-10-CM | POA: Diagnosis present

## 2021-03-24 DIAGNOSIS — F0280 Dementia in other diseases classified elsewhere without behavioral disturbance: Secondary | ICD-10-CM | POA: Diagnosis present

## 2021-03-24 DIAGNOSIS — K219 Gastro-esophageal reflux disease without esophagitis: Secondary | ICD-10-CM | POA: Diagnosis present

## 2021-03-24 DIAGNOSIS — R7881 Bacteremia: Secondary | ICD-10-CM | POA: Diagnosis present

## 2021-03-24 DIAGNOSIS — Z79899 Other long term (current) drug therapy: Secondary | ICD-10-CM

## 2021-03-24 DIAGNOSIS — A4151 Sepsis due to Escherichia coli [E. coli]: Principal | ICD-10-CM | POA: Diagnosis present

## 2021-03-24 DIAGNOSIS — E039 Hypothyroidism, unspecified: Secondary | ICD-10-CM | POA: Diagnosis present

## 2021-03-24 DIAGNOSIS — Z6834 Body mass index (BMI) 34.0-34.9, adult: Secondary | ICD-10-CM

## 2021-03-24 HISTORY — PX: CYSTOSCOPY WITH STENT PLACEMENT: SHX5790

## 2021-03-24 LAB — HIV ANTIBODY (ROUTINE TESTING W REFLEX): HIV Screen 4th Generation wRfx: NONREACTIVE

## 2021-03-24 LAB — BLOOD GAS, VENOUS
Acid-base deficit: 1 mmol/L (ref 0.0–2.0)
Bicarbonate: 27.1 mmol/L (ref 20.0–28.0)
O2 Saturation: 36.8 %
Patient temperature: 37
pCO2, Ven: 59 mmHg (ref 44–60)
pH, Ven: 7.27 (ref 7.25–7.43)
pO2, Ven: <31 mmHg — CL (ref 32–45)

## 2021-03-24 LAB — CBC WITH DIFFERENTIAL/PLATELET
Abs Immature Granulocytes: 1.34 10*3/uL — ABNORMAL HIGH (ref 0.00–0.07)
Basophils Absolute: 0.2 10*3/uL — ABNORMAL HIGH (ref 0.0–0.1)
Basophils Relative: 1 %
Eosinophils Absolute: 0 10*3/uL (ref 0.0–0.5)
Eosinophils Relative: 0 %
HCT: 40.1 % (ref 36.0–46.0)
Hemoglobin: 12.4 g/dL (ref 12.0–15.0)
Immature Granulocytes: 4 %
Lymphocytes Relative: 3 %
Lymphs Abs: 0.8 10*3/uL (ref 0.7–4.0)
MCH: 30.8 pg (ref 26.0–34.0)
MCHC: 30.9 g/dL (ref 30.0–36.0)
MCV: 99.5 fL (ref 80.0–100.0)
Monocytes Absolute: 0.8 10*3/uL (ref 0.1–1.0)
Monocytes Relative: 3 %
Neutro Abs: 27.9 10*3/uL — ABNORMAL HIGH (ref 1.7–7.7)
Neutrophils Relative %: 89 %
Platelets: 160 10*3/uL (ref 150–400)
RBC: 4.03 MIL/uL (ref 3.87–5.11)
RDW: 16 % — ABNORMAL HIGH (ref 11.5–15.5)
Smear Review: NORMAL
WBC Morphology: INCREASED
WBC: 31 10*3/uL — ABNORMAL HIGH (ref 4.0–10.5)
nRBC: 0 % (ref 0.0–0.2)

## 2021-03-24 LAB — COMPREHENSIVE METABOLIC PANEL
ALT: 14 U/L (ref 0–44)
AST: 24 U/L (ref 15–41)
Albumin: 3 g/dL — ABNORMAL LOW (ref 3.5–5.0)
Alkaline Phosphatase: 59 U/L (ref 38–126)
Anion gap: 11 (ref 5–15)
BUN: 27 mg/dL — ABNORMAL HIGH (ref 8–23)
CO2: 26 mmol/L (ref 22–32)
Calcium: 8.3 mg/dL — ABNORMAL LOW (ref 8.9–10.3)
Chloride: 99 mmol/L (ref 98–111)
Creatinine, Ser: 1.74 mg/dL — ABNORMAL HIGH (ref 0.44–1.00)
GFR, Estimated: 32 mL/min — ABNORMAL LOW (ref >60)
Glucose, Bld: 119 mg/dL — ABNORMAL HIGH (ref 70–99)
Potassium: 4.8 mmol/L (ref 3.5–5.1)
Sodium: 136 mmol/L (ref 135–145)
Total Bilirubin: 0.7 mg/dL (ref 0.3–1.2)
Total Protein: 6.7 g/dL (ref 6.5–8.1)

## 2021-03-24 LAB — PROTIME-INR
INR: 1.2 (ref 0.8–1.2)
Prothrombin Time: 15.1 seconds (ref 11.4–15.2)

## 2021-03-24 LAB — LACTIC ACID, PLASMA
Lactic Acid, Venous: 2.2 mmol/L (ref 0.5–1.9)
Lactic Acid, Venous: 4.1 mmol/L (ref 0.5–1.9)

## 2021-03-24 LAB — APTT: aPTT: 32 seconds (ref 24–36)

## 2021-03-24 LAB — RESP PANEL BY RT-PCR (FLU A&B, COVID) ARPGX2
Influenza A by PCR: NEGATIVE
Influenza B by PCR: NEGATIVE
SARS Coronavirus 2 by RT PCR: NEGATIVE

## 2021-03-24 SURGERY — CYSTOSCOPY, WITH STENT INSERTION
Anesthesia: General | Laterality: Left

## 2021-03-24 MED ORDER — PHENYLEPHRINE 40 MCG/ML (10ML) SYRINGE FOR IV PUSH (FOR BLOOD PRESSURE SUPPORT)
PREFILLED_SYRINGE | INTRAVENOUS | Status: AC
  Filled 2021-03-24: qty 10

## 2021-03-24 MED ORDER — ALBUTEROL SULFATE HFA 108 (90 BASE) MCG/ACT IN AERS: 1.0000 | INHALATION_SPRAY | RESPIRATORY_TRACT | Status: DC | PRN

## 2021-03-24 MED ORDER — ONDANSETRON HCL 4 MG/2ML IJ SOLN
INTRAMUSCULAR | Status: AC
  Filled 2021-03-24: qty 2

## 2021-03-24 MED ORDER — ONDANSETRON HCL 4 MG/2ML IJ SOLN
4.0000 mg | Freq: Four times a day (QID) | INTRAMUSCULAR | Status: DC | PRN
  Administered 2021-03-24: 19:00:00 4 mg via INTRAVENOUS

## 2021-03-24 MED ORDER — METRONIDAZOLE 500 MG/100ML IV SOLN
500.0000 mg | Freq: Once | INTRAVENOUS | Status: AC
  Administered 2021-03-24: 12:00:00 500 mg via INTRAVENOUS
  Filled 2021-03-24: qty 100

## 2021-03-24 MED ORDER — LACTATED RINGERS IV BOLUS (SEPSIS)
1000.0000 mL | Freq: Once | INTRAVENOUS | Status: AC
  Administered 2021-03-24: 12:00:00 1000 mL via INTRAVENOUS

## 2021-03-24 MED ORDER — ONDANSETRON HCL 4 MG PO TABS: 4.0000 mg | ORAL_TABLET | Freq: Four times a day (QID) | ORAL | Status: DC | PRN

## 2021-03-24 MED ORDER — PANTOPRAZOLE SODIUM 40 MG PO TBEC
40.0000 mg | DELAYED_RELEASE_TABLET | Freq: Every day | ORAL | Status: DC
Start: 2021-03-25 — End: 2021-03-26
  Administered 2021-03-25: 40 mg via ORAL
  Filled 2021-03-24 (×2): qty 1

## 2021-03-24 MED ORDER — SODIUM CHLORIDE 0.9 % IV SOLN: INTRAVENOUS | Status: DC

## 2021-03-24 MED ORDER — SODIUM CHLORIDE 0.9 % IR SOLN
Status: DC | PRN
  Administered 2021-03-24: 3000 mL via INTRAVESICAL

## 2021-03-24 MED ORDER — MEMANTINE HCL 5 MG PO TABS: 10.0000 mg | ORAL_TABLET | Freq: Two times a day (BID) | ORAL | Status: DC

## 2021-03-24 MED ORDER — LIDOCAINE HCL (PF) 2 % IJ SOLN
INTRAMUSCULAR | Status: AC
  Filled 2021-03-24: qty 5

## 2021-03-24 MED ORDER — FENTANYL CITRATE (PF) 100 MCG/2ML IJ SOLN
INTRAMUSCULAR | Status: DC | PRN
Start: 2021-03-24 — End: 2021-03-24
  Administered 2021-03-24: 25 ug via INTRAVENOUS

## 2021-03-24 MED ORDER — ACETAMINOPHEN 650 MG RE SUPP: 650.0000 mg | Freq: Four times a day (QID) | RECTAL | Status: DC | PRN

## 2021-03-24 MED ORDER — PHENYLEPHRINE HCL-NACL 20-0.9 MG/250ML-% IV SOLN
INTRAVENOUS | Status: AC
  Filled 2021-03-24: qty 250

## 2021-03-24 MED ORDER — LEVETIRACETAM 750 MG PO TABS
750.0000 mg | ORAL_TABLET | Freq: Two times a day (BID) | ORAL | Status: DC
  Administered 2021-03-24 – 2021-03-25 (×3): 750 mg via ORAL
  Filled 2021-03-24 (×5): qty 1

## 2021-03-24 MED ORDER — EPHEDRINE SULFATE (PRESSORS) 50 MG/ML IJ SOLN
INTRAMUSCULAR | Status: DC | PRN
  Administered 2021-03-24 (×2): 5 mg via INTRAVENOUS
  Administered 2021-03-24: 10 mg via INTRAVENOUS

## 2021-03-24 MED ORDER — ACETAMINOPHEN 500 MG PO TABS
1000.0000 mg | ORAL_TABLET | Freq: Once | ORAL | Status: AC
  Administered 2021-03-24: 14:00:00 1000 mg via ORAL
  Filled 2021-03-24: qty 2

## 2021-03-24 MED ORDER — PROPOFOL 10 MG/ML IV BOLUS
INTRAVENOUS | Status: DC | PRN
  Administered 2021-03-24: 20 mg via INTRAVENOUS
  Administered 2021-03-24: 100 mg via INTRAVENOUS
  Administered 2021-03-24: 20 mg via INTRAVENOUS

## 2021-03-24 MED ORDER — PROPOFOL 10 MG/ML IV BOLUS
INTRAVENOUS | Status: AC
  Filled 2021-03-24: qty 20

## 2021-03-24 MED ORDER — VANCOMYCIN HCL 2000 MG/400ML IV SOLN
2000.0000 mg | Freq: Once | INTRAVENOUS | Status: AC
  Administered 2021-03-24: 14:00:00 2000 mg via INTRAVENOUS
  Filled 2021-03-24: qty 400

## 2021-03-24 MED ORDER — SUCCINYLCHOLINE CHLORIDE 200 MG/10ML IV SOSY
PREFILLED_SYRINGE | INTRAVENOUS | Status: AC
  Filled 2021-03-24: qty 10

## 2021-03-24 MED ORDER — ENOXAPARIN SODIUM 60 MG/0.6ML IJ SOSY
0.5000 mg/kg | PREFILLED_SYRINGE | INTRAMUSCULAR | Status: DC
  Administered 2021-03-24 – 2021-03-28 (×5): 50 mg via SUBCUTANEOUS
  Filled 2021-03-24 (×5): qty 0.6

## 2021-03-24 MED ORDER — CEFTRIAXONE SODIUM 2 G IJ SOLR
2.0000 g | INTRAMUSCULAR | Status: DC
  Administered 2021-03-24 – 2021-03-26 (×3): 2 g via INTRAVENOUS
  Filled 2021-03-24 (×2): qty 20
  Filled 2021-03-24: qty 2
  Filled 2021-03-24: qty 20

## 2021-03-24 MED ORDER — SENNA 8.6 MG PO TABS
2.0000 | ORAL_TABLET | Freq: Every day | ORAL | Status: DC
  Administered 2021-03-24 – 2021-03-28 (×5): 17.2 mg via ORAL
  Filled 2021-03-24 (×5): qty 2

## 2021-03-24 MED ORDER — SUCCINYLCHOLINE CHLORIDE 200 MG/10ML IV SOSY
PREFILLED_SYRINGE | INTRAVENOUS | Status: DC | PRN
  Administered 2021-03-24: 120 mg via INTRAVENOUS

## 2021-03-24 MED ORDER — FENTANYL CITRATE (PF) 100 MCG/2ML IJ SOLN
INTRAMUSCULAR | Status: AC
  Filled 2021-03-24: qty 2

## 2021-03-24 MED ORDER — SODIUM CHLORIDE 0.9 % IV SOLN
500.0000 mg | INTRAVENOUS | Status: DC
  Administered 2021-03-25: 500 mg via INTRAVENOUS
  Filled 2021-03-24 (×3): qty 5

## 2021-03-24 MED ORDER — SODIUM CHLORIDE 0.9 % IV SOLN
2.0000 g | Freq: Once | INTRAVENOUS | Status: AC
  Administered 2021-03-24: 12:00:00 2 g via INTRAVENOUS
  Filled 2021-03-24: qty 2

## 2021-03-24 MED ORDER — IPRATROPIUM-ALBUTEROL 0.5-2.5 (3) MG/3ML IN SOLN
3.0000 mL | Freq: Four times a day (QID) | RESPIRATORY_TRACT | Status: DC
  Administered 2021-03-25 (×2): 3 mL via RESPIRATORY_TRACT
  Filled 2021-03-24 (×2): qty 3

## 2021-03-24 MED ORDER — DEXAMETHASONE SODIUM PHOSPHATE 10 MG/ML IJ SOLN
INTRAMUSCULAR | Status: AC
  Filled 2021-03-24: qty 1

## 2021-03-24 MED ORDER — PHENYLEPHRINE HCL (PRESSORS) 10 MG/ML IV SOLN
INTRAVENOUS | Status: DC | PRN
  Administered 2021-03-24 (×2): 80 ug via INTRAVENOUS
  Administered 2021-03-24 (×2): 160 ug via INTRAVENOUS
  Administered 2021-03-24 (×2): 80 ug via INTRAVENOUS
  Administered 2021-03-24: 160 ug via INTRAVENOUS
  Administered 2021-03-24 (×2): 80 ug via INTRAVENOUS

## 2021-03-24 MED ORDER — ASPIRIN EC 81 MG PO TBEC
81.0000 mg | DELAYED_RELEASE_TABLET | Freq: Every day | ORAL | Status: DC
  Administered 2021-03-25: 81 mg via ORAL
  Filled 2021-03-24 (×3): qty 1

## 2021-03-24 MED ORDER — PRAVASTATIN SODIUM 20 MG PO TABS
40.0000 mg | ORAL_TABLET | Freq: Every day | ORAL | Status: DC
  Administered 2021-03-24 – 2021-03-28 (×5): 40 mg via ORAL
  Filled 2021-03-24 (×5): qty 2

## 2021-03-24 MED ORDER — ALBUTEROL SULFATE (2.5 MG/3ML) 0.083% IN NEBU: 2.5000 mg | INHALATION_SOLUTION | RESPIRATORY_TRACT | Status: DC | PRN

## 2021-03-24 MED ORDER — IOHEXOL 180 MG/ML  SOLN
INTRAMUSCULAR | Status: DC | PRN
  Administered 2021-03-24: 19:00:00 20 mL via ORAL

## 2021-03-24 MED ORDER — SODIUM CHLORIDE 0.9 % IV SOLN
3.0000 g | Freq: Three times a day (TID) | INTRAVENOUS | Status: DC
  Filled 2021-03-24 (×3): qty 8

## 2021-03-24 MED ORDER — LATANOPROST 0.005 % OP SOLN
1.0000 [drp] | Freq: Every day | OPHTHALMIC | Status: DC
  Administered 2021-03-24 – 2021-03-28 (×5): 1 [drp] via OPHTHALMIC
  Filled 2021-03-24: qty 2.5

## 2021-03-24 MED ORDER — FENTANYL CITRATE (PF) 100 MCG/2ML IJ SOLN: 25.0000 ug | INTRAMUSCULAR | Status: DC | PRN

## 2021-03-24 MED ORDER — LIDOCAINE HCL (CARDIAC) PF 100 MG/5ML IV SOSY
PREFILLED_SYRINGE | INTRAVENOUS | Status: DC | PRN
  Administered 2021-03-24: 80 mg via INTRAVENOUS

## 2021-03-24 MED ORDER — ACETAMINOPHEN 325 MG PO TABS: 650.0000 mg | ORAL_TABLET | Freq: Four times a day (QID) | ORAL | Status: DC | PRN

## 2021-03-24 MED ORDER — GUAIFENESIN ER 600 MG PO TB12
1200.0000 mg | ORAL_TABLET | Freq: Two times a day (BID) | ORAL | Status: DC
  Administered 2021-03-24 – 2021-03-25 (×3): 1200 mg via ORAL
  Filled 2021-03-24 (×3): qty 2

## 2021-03-24 MED ORDER — VANCOMYCIN HCL IN DEXTROSE 1-5 GM/200ML-% IV SOLN: 1000.0000 mg | Freq: Once | INTRAVENOUS | Status: DC

## 2021-03-24 MED ORDER — LACTATED RINGERS IV SOLN: INTRAVENOUS | Status: AC

## 2021-03-24 MED ORDER — EPHEDRINE 5 MG/ML INJ
INTRAVENOUS | Status: AC
  Filled 2021-03-24: qty 5

## 2021-03-24 MED ORDER — LEVOTHYROXINE SODIUM 100 MCG PO TABS
100.0000 ug | ORAL_TABLET | Freq: Every day | ORAL | Status: DC
  Administered 2021-03-25 – 2021-03-29 (×5): 100 ug via ORAL
  Filled 2021-03-24 (×5): qty 1

## 2021-03-24 MED ORDER — DEXAMETHASONE SODIUM PHOSPHATE 10 MG/ML IJ SOLN
INTRAMUSCULAR | Status: DC | PRN
  Administered 2021-03-24: 10 mg via INTRAVENOUS

## 2021-03-24 MED ORDER — ONDANSETRON HCL 4 MG/2ML IJ SOLN: 4.0000 mg | Freq: Once | INTRAMUSCULAR | Status: DC | PRN

## 2021-03-24 SURGICAL SUPPLY — 22 items
BAG DRAIN CYSTO-URO LG1000N (MISCELLANEOUS) ×2 IMPLANT
BRUSH SCRUB EZ 1% IODOPHOR (MISCELLANEOUS) IMPLANT
CATH URETL OPEN 5X70 (CATHETERS) ×2 IMPLANT
GAUZE 4X4 16PLY ~~LOC~~+RFID DBL (SPONGE) ×4 IMPLANT
GLOVE SURG UNDER POLY LF SZ7.5 (GLOVE) ×2 IMPLANT
GOWN STRL REUS W/ TWL LRG LVL3 (GOWN DISPOSABLE) ×1 IMPLANT
GOWN STRL REUS W/ TWL XL LVL3 (GOWN DISPOSABLE) ×1 IMPLANT
GOWN STRL REUS W/TWL LRG LVL3 (GOWN DISPOSABLE) ×1
GOWN STRL REUS W/TWL XL LVL3 (GOWN DISPOSABLE) ×1
GUIDEWIRE STR DUAL SENSOR (WIRE) ×2 IMPLANT
IV NS IRRIG 3000ML ARTHROMATIC (IV SOLUTION) ×2 IMPLANT
KIT TURNOVER CYSTO (KITS) ×2 IMPLANT
MANIFOLD NEPTUNE II (INSTRUMENTS) ×2 IMPLANT
PACK CYSTO AR (MISCELLANEOUS) ×2 IMPLANT
SET CYSTO W/LG BORE CLAMP LF (SET/KITS/TRAYS/PACK) ×2 IMPLANT
STENT URET 6FRX24 CONTOUR (STENTS) IMPLANT
STENT URET 6FRX26 CONTOUR (STENTS) ×1 IMPLANT
SURGILUBE 2OZ TUBE FLIPTOP (MISCELLANEOUS) ×2 IMPLANT
SYR TOOMEY IRRIG 70ML (MISCELLANEOUS)
SYRINGE TOOMEY IRRIG 70ML (MISCELLANEOUS) IMPLANT
WATER STERILE IRR 1000ML POUR (IV SOLUTION) ×2 IMPLANT
WATER STERILE IRR 500ML POUR (IV SOLUTION) ×2 IMPLANT

## 2021-03-24 NOTE — Progress Notes (Signed)
Elink following Sepsis bundle. 

## 2021-03-24 NOTE — Transfer of Care (Signed)
Immediate Anesthesia Transfer of Care Note ? ?Patient: [place] ? ?Procedure(s) Performed: CYSTOSCOPY WITH STENT PLACEMENT (Left) ? ?Patient Location: PACU ? ?Anesthesia Type:General ? ?Level of Consciousness: drowsy ? ?Airway & Oxygen Therapy: Patient Spontanous Breathing and Patient connected to face mask oxygen ? ?Post-op Assessment: Report given to RN and Post -op Vital signs reviewed and stable ? ?Post vital signs: Reviewed and stable ? ?Last Vitals:  ?Vitals Value Taken Time  ?BP 115/72 03/24/21 1908  ?Temp    ?Pulse 87 03/24/21 1908  ?Resp 19 03/24/21 1911  ?SpO2 99 % 03/24/21 1908  ?Vitals shown include unvalidated device data. ? ?Last Pain:  ?Vitals:  ? 03/24/21 1457  ?TempSrc: Oral  ?   ? ?  ? ?Complications: No notable events documented. ?

## 2021-03-24 NOTE — ED Notes (Signed)
Pt taken to CT at this time.

## 2021-03-24 NOTE — ED Provider Notes (Signed)
? ?[facility] ?Provider Note ? ? ? Event Date/Time  ? First MD Initiated Contact with Patient 03/24/21 1127   ?  (approximate) ? ?History  ? ?Chief Complaint: Code Sepsis ? ?HPI ? ?Lori Petersen [facility] is a 66 y.o. female with a past medical history of arthritis, dementia, Down syndrome, presents to the emergency department for vomiting and generalized weakness.  According to the care facility patient was vomiting since yesterday today was weak and had an elevated heart rate so they sent her to the emergency department.  Upon arrival patient noted to be febrile to 101.2 as well as tachycardic.  Patient satting 82% on room no baseline O2 requirement placed on 2 L nasal cannula.  Patient is unable to provide any additional history. ? ?Physical Exam  ? ?Triage Vital Signs: ?ED Triage Vitals  ?Enc Vitals Group  ?   BP 03/24/21 1136 (!) 85/50  ?   Pulse Rate 03/24/21 1136 (!) 102  ?   Resp 03/24/21 1136 (!) 26  ?   Temp 03/24/21 1136 (!) 101.2 ?F (38.4 ?C)  ?   Temp Source 03/24/21 1136 Oral  ?   SpO2 03/24/21 1136 98 %  ?   Weight 03/24/21 1134 218 lb 4.1 oz (99 kg)  ?   Height 03/24/21 1134 5\' 7"  (1.702 m)  ?   Head Circumference --   ?   [facility] Flow --   ?   Pain Score --   ?   Pain Loc --   ?   Pain Edu? --   ?   Excl. in GC? --   ? ? ?Most recent vital signs: ?Vitals:  ? 03/24/21 1245 03/24/21 1311  ?BP: (!) 109/43 99/73  ?Pulse:  (!) 103  ?Resp: 20 20  ?Temp:    ?SpO2:  95%  ? ? ?General: Awake, no distress.  ?CV:  Good peripheral perfusion.  Regular rate and rhythm  ?Resp:  Normal effort.  Equal breath sounds bilaterally.  ?Abd:  No distention.  Soft, nontender.  No rebound or guarding. ? ? ?ED Results / Procedures / Treatments  ? ?EKG ? ?EKG viewed and interpreted by myself shows a sinus tachycardia 100 bpm with a narrow QRS, left axis deviation, slight QTc prolongation otherwise normal intervals with no concerning ST changes. ? ?RADIOLOGY ? ?I personally reviewed the chest x-ray images appears to  have hazy opacities bilaterally mostly interstitial. ?Radiology has read the x-ray as low lung volumes, bibasilar airspace disease atelectasis versus pneumonia.  Hilar prominence due to pulmonary artery enlargement. ? ?MEDICATIONS ORDERED IN ED: ?Medications  ?lactated ringers infusion (has no administration in time range)  ?vancomycin (VANCOREADY) IVPB 2000 mg/400 mL (has no administration in time range)  ?acetaminophen (TYLENOL) tablet 1,000 mg (has no administration in time range)  ?lactated ringers bolus 1,000 mL (1,000 mLs Intravenous New Bag/Given 03/24/21 1203)  ?  And  ?lactated ringers bolus 1,000 mL (1,000 mLs Intravenous New Bag/Given 03/24/21 1204)  ?  And  ?lactated ringers bolus 1,000 mL (1,000 mLs Intravenous New Bag/Given 03/24/21 1203)  ?ceFEPIme (MAXIPIME) 2 g in sodium chloride 0.9 % 100 mL IVPB (0 g Intravenous Stopped 03/24/21 1253)  ?metroNIDAZOLE (FLAGYL) IVPB 500 mg (0 mg Intravenous Stopped 03/24/21 1314)  ? ? ? ?IMPRESSION / MDM / ASSESSMENT AND PLAN / ED COURSE  ?I reviewed the triage vital signs and the nursing notes. ? ?Patient presents emergency department from a nursing facility found to be febrile tachycardic and hypoxic.  Patient initially hypotensive as well concerning for severe sepsis/septic shock.  Given the patient's vital signs we will check labs, cultures, chest x-ray, urine sample.  We will start patient on broad-spectrum antibiotics and obtain a COVID/flu test.  Given her hypotension and concern for severe sepsis we will dose 30 mils per kilogram of lactated Ringer's for a total of 3 L of fluid.  Patient is hypoxic however satting well on 2 L nasal cannula.  Will require admission to the hospital service once her ER work-up is been completed.  Given the report of vomiting yesterday it is possible patient has aspirated which could lead to her fever and sepsis today.  Patient receiving broad-spectrum antibiotics. ? ?Patient's lab work has begun to result showing significant leukocytosis  of 31,000 which is significantly elevated compared to her baseline around 7.  Patient's lactate is also elevated at 2.2.  Chemistry shows mild renal insufficiency compared to baseline otherwise no concerning findings.  COVID/flu are negative. ? ? ?CRITICAL CARE ?Performed by: Minna Antis ? ? ?Total critical care time: 30 minutes ? ?Critical care time was exclusive of separately billable procedures and treating other patients. ? ?Critical care was necessary to treat or prevent imminent or life-threatening deterioration. ? ?Critical care was time spent personally by me on the following activities: development of treatment plan with patient and/or surrogate as well as nursing, discussions with consultants, evaluation of patient's response to treatment, examination of patient, obtaining history from patient or surrogate, ordering and performing treatments and interventions, ordering and review of laboratory studies, ordering and review of radiographic studies, pulse oximetry and re-evaluation of patient's condition. ? ? ?FINAL CLINICAL IMPRESSION(S) / ED DIAGNOSES  ? ?Sepsis ? ? ?Note:  This document was prepared using Dragon voice recognition software and may include unintentional dictation errors. ?  ?Minna Antis, MD ?03/24/21 1500 ? ?

## 2021-03-24 NOTE — Progress Notes (Signed)
Pharmacy Antibiotic Note ? ?Lori Petersen [facility] is a 66 y.o. female admitted on 03/24/2021 with sepsis/asp PNA vs CAP. Pharmacy has been consulted for Unasyn dosing. ? ?Plan: ?Unasyn 3gm IV q 8hr ?Monitor renal fxn, f/u cultures and clinical status. ? ?Height: 5\' 7"  (170.2 cm) ?Weight: 99 kg (218 lb 4.1 oz) ?IBW/kg (Calculated) : 61.6 ? ?Temp (24hrs), Avg:101.2 ?F (38.4 ?C), Min:101.2 ?F (38.4 ?C), Max:101.2 ?F (38.4 ?C) ? ?Recent Labs  ?Lab 03/24/21 ?1139  ?WBC 31.0*  ?CREATININE 1.74*  ?LATICACIDVEN 2.2*  ?  ?Estimated Creatinine Clearance: 39 mL/min (A) (by C-G formula based on SCr of 1.74 mg/dL (H)).   ? ?No Known Allergies ? ?Antimicrobials this admission: ?3/9 Cefepime X1 ?3/9 Flagyl x1 ?3/9 Vanc x1 ?3/9 Unasyn >> ?3/9 Azith >> ? ?Microbiology results: ?3/9 BCx >> ?3/9 UCx >> ?3/9 MRSA PCR >> ?3/9 Sputum Cx >> ? ?Thank you for allowing pharmacy to be a part of this patient?s care. ? ?5/9 ?03/24/2021 2:37 PM ? ?

## 2021-03-24 NOTE — Progress Notes (Addendum)
CT chest and abd reviewed. ? ?Acute left ureter obstructing stone and hydroureter hydronephrosis, current urine appears to be cloudy, and UA pending, given clinical symptoms from yesterday that patient started to have a nauseous vomiting, likely there has been complicated UTI with obstructive uropathy.  Discussed with on-call urology, who plans for left-sided ureteral stenting tomorrow. ? ? ?CT chest showed bilateral lung vasculature congestion and cardiomegaly suspicious for CHF.  Reviewed patient's history, there was no history of CHF in her past.  Clinically, she has borderline hypotension and remains tachycardic, her oxygen saturations remained stable on 3 L.  Plan to use low rate of maintenance IV hydration and consider stop IV fluid if oxygen demands increased to more than 4 L.  Antibiotic wise, given there is no significant infiltrates on chest x-ray, change Unasyn to ceftriaxone to cover both pneumonia and UTI.  Maintain azithromycin. ? ?Legal guardian updated, all questions answered with my best knowledge. ?

## 2021-03-24 NOTE — Consult Note (Signed)
CODE SEPSIS - PHARMACY COMMUNICATION ? ?**Broad Spectrum Antibiotics should be administered within 1 hour of Sepsis diagnosis** ? ?Time Code Sepsis Called/Page Received: 1143 ? ?Antibiotics Ordered: 1143 ? ?Time of 1st antibiotic administration: 1206 ? ?Additional action taken by pharmacy: N/A ? ?If necessary, Name of Provider/Nurse Contacted: N/A ? ? ? ?Derrek Gu ,PharmD ?Clinical Pharmacist  ?03/24/2021  11:45 AM ? ?

## 2021-03-24 NOTE — Anesthesia Preprocedure Evaluation (Signed)
Anesthesia Evaluation  ?Patient identified by MRN, date of birth, ID band ?Patient awake ? ? ? ?Reviewed: ?Allergy & Precautions, H&P , NPO status , Patient's Chart, lab work & pertinent test results, reviewed documented beta blocker date and time  ? ?Airway ?Mallampati: III ? ?TM Distance: >3 FB ?Neck ROM: full ? ? ? Dental ? ?(+) Edentulous Upper, Edentulous Lower ?  ?Pulmonary ?asthma , pneumonia, resolved,  ?  ?Pulmonary exam normal ? ? ? ? ? ? ? Cardiovascular ?Exercise Tolerance: Poor ?+CHF  ?Normal cardiovascular exam ?Rhythm:regular Rate:Normal ? ? ?  ?Neuro/Psych ?Seizures -,  PSYCHIATRIC DISORDERS Dementia   ? GI/Hepatic ?Neg liver ROS, GERD  Medicated,  ?Endo/Other  ?Hypothyroidism  ? Renal/GU ?Renal disease  ?negative genitourinary ?  ?Musculoskeletal ? ? Abdominal ?  ?Peds ? Hematology ?negative hematology ROS ?(+)   ?Anesthesia Other Findings ?Past Medical History: ?No date: Arthritis ?    Comment:  lower spine ?No date: Asthma ?No date: Dementia [facility]) ?No date: Diastolic congestive heart failure ([place]) ?    Comment:  "only has problems with bad colds" ?No date: Down's syndrome ?No date: Full dentures ?    Comment:  does not wear ?No date: GERD (gastroesophageal reflux disease) ?No date: Hypothyroidism ?No date: Onychomycosis ?No date: Seizures ([place]) ?Past Surgical History: ?04/05/2016: BREAST BIOPSY; Left ?    Comment:  neg. cylinder clip ?12/06/2016: BREAST BIOPSY; Left ?    Comment:  ribbon clip. path pending ?04/24/2016: COLONOSCOPY WITH PROPOFOL; N/A ?    Comment:  Procedure: COLONOSCOPY WITH PROPOFOL;  Surgeon: Evangeline Gula  ?             Allen Norris, MD;  Location: [place];  Service:  ?             Endoscopy;  Laterality: N/A;  Leave patient at 10:25 due  ?             to transportation ?04/24/2016: ESOPHAGOGASTRODUODENOSCOPY (EGD) WITH PROPOFOL; N/A ?    Comment:  Procedure: ESOPHAGOGASTRODUODENOSCOPY (EGD) WITH  ?             PROPOFOL;  Surgeon: Lucilla Lame, MD;   Location: [facility]  ?             [facility];  Service: Endoscopy;  Laterality: N/A; ?No date: HERNIA REPAIR ?BMI   ? Body Mass Index: 34.18 kg/m?  ?  ? Reproductive/Obstetrics ?negative OB ROS ? ?  ? ? ? ? ? ? ? ? ? ? ? ? ? ?  ?  ? ? ? ? ? ? ? ? ?Anesthesia Physical ?Anesthesia Plan ? ?ASA: 4 and emergent ? ?Anesthesia Plan: General ETT  ? ?Post-op Pain Management:   ? ?Induction:  ? ?PONV Risk Score and Plan: 4 or greater ? ?Airway Management Planned:  ? ?Additional Equipment:  ? ?Intra-op Plan:  ? ?Post-operative Plan:  ? ?Informed Consent: I have reviewed the patients History and Physical, chart, labs and discussed the procedure including the risks, benefits and alternatives for the proposed anesthesia with the patient or authorized representative who has indicated his/her understanding and acceptance.  ? ? ? ?Dental Advisory Given ? ?Plan Discussed with: CRNA ? ?Anesthesia Plan Comments:   ? ? ? ? ? ? ?Anesthesia Quick Evaluation ? ?

## 2021-03-24 NOTE — Progress Notes (Signed)
2nd lactate done earlier per bedside nurse. ?

## 2021-03-24 NOTE — Consult Note (Signed)
?  ? ?Urology Consult  ? ?I have been asked to see the patient by Dr. Chipper Herb, for evaluation and management of left ureteral stone and suspected sepsis from urinary source. ? ?Chief Complaint: Fever, abdominal pain, nausea/vomiting ? ?HPI:  ?Lori Petersen is a 66 y.o. female with medical history notable for Down syndrome, dementia who presented to [facility] this morning from [facility] for emesis, hypoxia, hypotension, tachycardia, and fevers.  The history is obtained entirely from chart review and her legal guardian Margurite Auerbach via telephone secondary to her baseline dementia.  She was originally admitted with suspected pulmonary source of sepsis, however CT chest abdomen and pelvis was performed this afternoon that showed no evidence of pneumonia, but a 7 mm left distal ureteral stone with upstream hydronephrosis.  No urinalysis has been sent, but based on her clinical picture highly suspect sepsis from urinary source with infected left ureteral stone. ? ?PMH: ?Past Medical History:  ?Diagnosis Date  ? Arthritis   ? lower spine  ? Asthma   ? Dementia (HCC)   ? Diastolic congestive heart failure (HCC)   ? "only has problems with bad colds"  ? Down's syndrome   ? Full dentures   ? does not wear  ? GERD (gastroesophageal reflux disease)   ? Hypothyroidism   ? Onychomycosis   ? Seizures (HCC)   ? ? ?Surgical History: ?Past Surgical History:  ?Procedure Laterality Date  ? BREAST BIOPSY Left 04/05/2016  ? neg. cylinder clip  ? BREAST BIOPSY Left 12/06/2016  ? ribbon clip. path pending  ? COLONOSCOPY WITH PROPOFOL N/A 04/24/2016  ? Procedure: COLONOSCOPY WITH PROPOFOL;  Surgeon: Midge Minium, MD;  [facility];  Service: Endoscopy;  Laterality: N/A;  Leave patient at 10:25 due to transportation  ? ESOPHAGOGASTRODUODENOSCOPY (EGD) WITH PROPOFOL N/A 04/24/2016  ? Procedure: ESOPHAGOGASTRODUODENOSCOPY (EGD) WITH PROPOFOL;  Surgeon: Midge Minium, MD;  Location: [facility];  Service: Endoscopy;   Laterality: N/A;  ? HERNIA REPAIR    ? ? ? ?Allergies: No Known Allergies ? ?Family History: ?History reviewed. No pertinent family history. ? ?Social History:  reports that she has never smoked. She has never used smokeless tobacco. She reports that she does not currently use drugs. She reports that she does not drink alcohol. ? ?ROS: ?Unable to be obtained secondary to patient condition ? ?Physical Exam: ?BP 90/65 (BP Location: Right Wrist)   Pulse (!) 105   Temp 100.3 ?F (37.9 ?C)   Resp 16   Ht 5\' 7"  (1.702 m)   Wt 99 kg   SpO2 95%   BMI 34.18 kg/m?   ? ?Constitutional: Confused, significant dementia ?Cardiovascular: Tachycardic, regular rhythm ?Respiratory: Clear to auscultation bilaterally ?GI: Abdomen is soft, tender, nondistended, no abdominal masses ?GU: Left CVA tenderness ?Lymph: No cervical or inguinal lymphadenopathy. ?Skin: No rashes, bruises or suspicious lesions. ? ?Laboratory Data: ?WBC 31 K ?Creatinine 1.74(baseline 1.3) ?Lactate 2.2 ? ?Pertinent Imaging: ?I have personally reviewed the CT showing a 7 mm left distal ureteral stone with moderate upstream hydronephrosis and perinephric stranding. ? ?Assessment & Plan:   ?66 year old female with Down syndrome and severe dementia who presents with 36 hours of abdominal pain, nausea/vomiting, fevers, hypotension, and hypoxia, with clinical picture most consistent with sepsis from urinary source with a obstructing 7 mm left distal ureteral stone. ? ?I had a long conversation with her legal guardian 77 over the phone regarding need for drainage of an infected obstructed system, and high risk  of worsening sepsis and even death with no intervention. We discussed the need for drainage in the setting of an infected and obstructed system.  A ureteral stent is a small plastic tube that is placed cystoscopically with one end in the kidney and the other end in the bladder that allows the infection from the kidney to drain, and relieves pain  from the obstructing stone.  We discussed the risks at length including bleeding, infection, sepsis, death, ureteral injury, and stent related symptoms including urgency/frequency/dysuria/flank pain/gross hematuria.  There is a low, but not 0, risk of inability to pass the ureteral stent alongside the stone from below which would require percutaneous nephrostomy tube by interventional radiology.  Finally, we discussed possible prolonged hospitalization and recovery, possible temporary Foley catheter placement, and 10 to 14-day course of antibiotics. ? ?We reviewed the need for a follow-up procedure for definitive management of their stone when the infection has been treated in 2 to 4 weeks with either ureteroscopy/laser lithotripsy. ? ?Recommendations: ?-OR tonight for cystoscopy and left ureteral stent placement ?-Informed consent obtained from legal guardian [name] ?-Continue broad-spectrum antibiotics and resuscitation per primary team ? ?Sondra Come, MD ? ? ?[facility] ?[address], [address] ?[place], [place] [address] ?[phone] ? ? ?

## 2021-03-24 NOTE — H&P (Addendum)
History and Physical    Lori Petersen [facility] KGY:[id number] DOB: 1955-11-20 DOA: 03/24/2021  [facility]P: [facility] (Confirm with patient/family/NH records and if not entered, this has to be entered at [facility] point of entry) Patient coming from: SNF  I have personally briefly reviewed patient's old medical records in [facility]  Chief Complaint: SOB  HPI: Lori Petersen is a 66 y.o. female with medical history significant of Down syndrome, advanced dementia, chronic HFpEF, asthma, seizure disorder, HLD, hypothyroidism, was sent from nursing home for evaluation of fever and new onset of hypoxia.  Patient has baseline dementia unable to provide any history, most history provided by ED staff and nursing home report.  Patient was found to have episode of vomiting yesterday, and this morning patient started to have a fever of 102 at nursing home as well as a new onset of hypoxia.  ED Course: Patient was found to be hypoxic 82% on room air, blood pressure on borderline low, was given IV bolus and blood pressure stabilized.  O2 saturations stabilized on 4 L 97%.  X-ray showed bilateral lower field infiltrates suspicious for pneumonia.  Blood work WBC 31, hemoglobin 12.4, creatinine 1.7 compared to baseline 1.1, K4.8 sodium 136.  Received vancomycin and cefepime.  Review of Systems: Unable to perform, baseline dementia.  Past Medical History:  Diagnosis Date   Arthritis    lower spine   Asthma    Dementia (HCC)    Diastolic congestive heart failure (HCC)    "only has problems with bad colds"   Down's syndrome    Full dentures    does not wear   GERD (gastroesophageal reflux disease)    Hypothyroidism    Onychomycosis    Seizures (HCC)     Past Surgical History:  Procedure Laterality Date   BREAST BIOPSY Left 04/05/2016   neg. cylinder clip   BREAST BIOPSY Left 12/06/2016   ribbon clip. path pending   COLONOSCOPY WITH PROPOFOL N/A 04/24/2016   Procedure: COLONOSCOPY  WITH PROPOFOL;  Surgeon: Midge Minium, MD;  Location: [facility];  Service: Endoscopy;  Laterality: N/A;  Leave patient at 10:25 due to transportation   ESOPHAGOGASTRODUODENOSCOPY (EGD) WITH PROPOFOL N/A 04/24/2016   Procedure: ESOPHAGOGASTRODUODENOSCOPY (EGD) WITH PROPOFOL;  Surgeon: Midge Minium, MD;  Location: [facility];  Service: Endoscopy;  Laterality: N/A;   HERNIA REPAIR       reports that she has never smoked. She has never used smokeless tobacco. She reports that she does not currently use drugs. She reports that she does not drink alcohol.  No Known Allergies  History reviewed. No pertinent family history.   Prior to Admission medications   Medication Sig Start Date End Date Taking? Authorizing Provider  albuterol (VENTOLIN HFA) 108 (90 Base) MCG/ACT inhaler Inhale 1 puff into the lungs every 4 (four) hours as needed for wheezing or shortness of breath.    [provider]  aspirin EC 81 MG tablet Take 81 mg by mouth daily.    [provider]  cholecalciferol (VITAMIN D3) 25 MCG (1000 UT) tablet Take 2,000 Units by mouth daily.    [provider]  latanoprost (XALATAN) 0.005 % ophthalmic solution Place 1 drop into the left eye at bedtime.    [provider]  levETIRAcetam (KEPPRA) 500 MG tablet Take 1 tablet (500 mg total) by mouth 2 (two) times daily. 11/21/19 12/21/19  Sharman Cheek, MD  levothyroxine (SYNTHROID) 88 MCG tablet Take 88 mcg by mouth  daily before breakfast.    [provider]  memantine (NAMENDA) 10 MG tablet Take 10 mg by mouth 2 (two) times daily.    [provider]  Multiple Vitamin (MULTIVITAMIN WITH MINERALS) TABS tablet Take 1 tablet by mouth daily.    [provider]  omeprazole (PRILOSEC) 20 MG capsule Take 20 mg by mouth daily.    [provider]  pravastatin (PRAVACHOL) 40 MG tablet Take 40 mg by mouth daily.    [provider]  senna (SENOKOT) 8.6 MG TABS  tablet Take 2 tablets by mouth at bedtime.    [provider]    Physical Exam: Vitals:   03/24/21 1226 03/24/21 1230 03/24/21 1245 03/24/21 1311  BP:  94/62 (!) 109/43 99/73  Pulse: (!) 102 88  (!) 103  Resp: (!) 21 18 20 20   Temp:      TempSrc:      SpO2: 100% 92%  95%  Weight:      Height:        Constitutional: NAD, calm, comfortable Vitals:   03/24/21 1226 03/24/21 1230 03/24/21 1245 03/24/21 1311  BP:  94/62 (!) 109/43 99/73  Pulse: (!) 102 88  (!) 103  Resp: (!) 21 18 20 20   Temp:      TempSrc:      SpO2: 100% 92%  95%  Weight:      Height:       Eyes: PERRL, lids and conjunctivae normal ENMT: Mucous membranes are dry. Posterior pharynx clear of any exudate or lesions.Normal dentition.  Neck: normal, supple, no masses, no thyromegaly Respiratory: clear to auscultation bilaterally, no wheezing, coarse crackles on B/L lower fields.  Increasing respiratory effort. No accessory muscle use.  Cardiovascular: Regular rate and rhythm, no murmurs / rubs / gallops. No extremity edema. 2+ pedal pulses. No carotid bruits.  Abdomen: no tenderness, no masses palpated. No hepatosplenomegaly. Bowel sounds positive.  Musculoskeletal: no clubbing / cyanosis. No joint deformity upper and lower extremities. Good ROM, no contractures. Normal muscle tone.  Skin: no rashes, lesions, ulcers. No induration Neurologic: CN 2-12 grossly intact. Sensation intact, DTR normal. Strength 5/5 in all 4.  Psychiatric: Baseline dementia   Labs on Admission: I have personally reviewed following labs and imaging studies  CBC: Recent Labs  Lab 03/24/21 1139  WBC 31.0*  NEUTROABS 27.9*  HGB 12.4  HCT 40.1  MCV 99.5  PLT 160   Basic Metabolic Panel: Recent Labs  Lab 03/24/21 1139  NA 136  K 4.8  CL 99  CO2 26  GLUCOSE 119*  BUN 27*  CREATININE 1.74*  CALCIUM 8.3*   GFR: Estimated Creatinine Clearance: 39 mL/min (A) (by C-G formula based on SCr of 1.74 mg/dL (H)). Liver  Function Tests: Recent Labs  Lab 03/24/21 1139  AST 24  ALT 14  ALKPHOS 59  BILITOT 0.7  PROT 6.7  ALBUMIN 3.0*   No results for input(s): LIPASE, AMYLASE in the last 168 hours. No results for input(s): AMMONIA in the last 168 hours. Coagulation Profile: Recent Labs  Lab 03/24/21 1205  INR 1.2   Cardiac Enzymes: No results for input(s): CKTOTAL, CKMB, CKMBINDEX, TROPONINI in the last 168 hours. BNP (last 3 results) No results for input(s): PROBNP in the last 8760 hours. HbA1C: No results for input(s): HGBA1C in the last 72 hours. CBG: No results for input(s): GLUCAP in the last 168 hours. Lipid Profile: No results for input(s): CHOL, HDL, LDLCALC, TRIG, CHOLHDL, LDLDIRECT in the last 72  hours. Thyroid Function Tests: No results for input(s): TSH, T4TOTAL, FREET4, T3FREE, THYROIDAB in the last 72 hours. Anemia Panel: No results for input(s): VITAMINB12, FOLATE, FERRITIN, TIBC, IRON, RETICCTPCT in the last 72 hours. Urine analysis:    Component Value Date/Time   COLORURINE YELLOW (A) 11/21/2019 1458   APPEARANCEUR CLEAR (A) 11/21/2019 1458   APPEARANCEUR CLEAR 02/22/2013 2300   LABSPEC 1.014 11/21/2019 1458   LABSPEC 1.013 02/22/2013 2300   PHURINE 8.0 11/21/2019 1458   GLUCOSEU NEGATIVE 11/21/2019 1458   GLUCOSEU NEGATIVE 02/22/2013 2300   HGBUR NEGATIVE 11/21/2019 1458   BILIRUBINUR NEGATIVE 11/21/2019 1458   BILIRUBINUR NEGATIVE 02/22/2013 2300   KETONESUR NEGATIVE 11/21/2019 1458   PROTEINUR NEGATIVE 11/21/2019 1458   NITRITE NEGATIVE 11/21/2019 1458   LEUKOCYTESUR NEGATIVE 11/21/2019 1458   LEUKOCYTESUR NEGATIVE 02/22/2013 2300    Radiological Exams on Admission: DG Chest Port 1 View  Result Date: 03/24/2021 CLINICAL DATA:  Questionable sepsis. Evaluate for abnormality. Asthma. CHF. Down syndrome. EXAM: PORTABLE CHEST 1 VIEW COMPARISON:  11/02/2018 FINDINGS: Midline trachea. Moderate cardiomegaly, accentuated by extremely low lung volumes. The hila appear  prominent, possibly secondary to pulmonary artery enlargement when correlated with the 2019 CT. No pleural effusion or pneumothorax. Low lung volumes with resultant pulmonary interstitial prominence. No overt congestive failure. The upper lungs are clear. The lung bases are poorly evaluated, with suggestion of mild bibasilar airspace disease. IMPRESSION: Extremely low lung volume portable radiograph, limiting evaluation. Suspect bibasilar airspace disease, most likely atelectasis. If PA and lateral radiographs are feasible, these should be considered. Prominence of both hila, likely attributed to pulmonary artery enlargement. Given differences in technique, similar to 01/19/2012. Electronically Signed   By: Jeronimo GreavesKyle  Talbot M.D.   On: 03/24/2021 12:27    EKG: Independently reviewed.  Sinus, borderline QTc prolongation, no acute ST changes.  Assessment/Plan Principal Problem:   PNA (pneumonia) Active Problems:   Pneumonia  (please populate well all problems here in Problem List. (For example, if patient is on BP meds at home and you resume or decide to hold them, it is a problem that needs to be her. Same for CAD, COPD, HLD and so on)  Sepsis -Secondary to pneumonia, aspiration pneumonia versus CAP -Sepsis evidenced by new onset of fever, hypoxia, tachycardia and elevated lactate level, with signs of endorgan damage of AKI, suspected source is aspiration pneumonia versus CAP.  We will de-escalate antibiotics coverage to Unasyn plus azithromycin. -Given the significant elevation of white count, ordered a CT chest without contrast to rule out pneumonia related complications.  Pending UA -NPO and speech evaluation -Aspiration precautions, head above bed 30 degrees  Acute hypoxic respite failure -Secondary to pneumonia, management as above. -Other DDx, CHF decompensation less likely, patient has chronic HFpEF, now volume depleted.  Nauseous vomiting -No symptoms or signs of nauseous vomiting or  abdominal pain in the ED.  Abdominal exam benign.  Consider abdominal imaging if GI symptoms come back.  AKI -Likely pre-renal from sepsis -Start NS IV fluid  Chronic HFpEF -Clinically appears to be dehydrated/hypovolemia -Start maintenance IV fluid normal saline 150 mL/h x 1 day then reevaluate.  Dementia, Down syndrome -Mentation at baseline.  Chronic mild intermittent asthma -No symptoms or signs of acute exacerbation, check VBG to rule out CO2 retention.  Seizure disorder -Continue Keppra  Hypothyroidism -Continue Synthroid  DVT prophylaxis: Heparin subcu Code Status: Full code Family Communication: None at bedside Disposition Plan: Patient is sick with sepsis from aspiration pneumonia, expect more than 2 midnight hospital  stay Consults called: None Admission status: Tele admit   Emeline General MD [facility] Pager [phone] 03/24/2021, 2:23 PM

## 2021-03-24 NOTE — ED Triage Notes (Signed)
Pt hx of alzheimers and downs syndrome. From [facility] via [facility]. Per Medic, facility reports pt was vomiting all day yesterday and has not vomited today. Not on O2 at baseline, pt 94% on 2L [place]. Per Medic facility MD stated he wanted pt sent out d/t pressures trending down and HR trending up.  ? ?85/50 ?106 ?26-30 ?BGL 136 ?T 99.1 ?

## 2021-03-24 NOTE — Op Note (Signed)
Date of procedure: 03/24/21 ? ?Preoperative diagnosis:  ?Left ureteral stone ?Sepsis from urinary source ? ?Postoperative diagnosis:  ?Same ? ?Procedure: ?Cystoscopy, left retrograde pyelogram with intraoperative interpretation, left ureteral stent placement ? ?Surgeon: Legrand Rams, MD ? ?Anesthesia: General ? ?Complications: None ? ?Intraoperative findings:  ?Erythematous bladder consistent with infection, ureteral orifices orthotopic ?Uncomplicated left ureteral stent placement with purulent drainage ? ?EBL: Minimal ? ?Specimens: Left renal aspirate for culture ? ?Drains: Left 6 French by 26 cm ureteral stent ? ?Indication: Lori Petersen [facility] is a 66 y.o. patient with sepsis from presumed urinary source and a 7 mm left distal stone who opted for stent placement.  Informed consent was obtained from her legal guardian [name].  After reviewing the management options for treatment, they elected to proceed with the above surgical procedure(s). We have discussed the potential benefits and risks of the procedure, side effects of the proposed treatment, the likelihood of the patient achieving the goals of the procedure, and any potential problems that might occur during the procedure or recuperation. Informed consent has been obtained. ? ?Description of procedure: ? ?The patient was taken to the operating room and general anesthesia was induced. SCDs were placed for DVT prophylaxis. The patient was placed in the dorsal lithotomy position, prepped and draped in the usual sterile fashion, and preoperative antibiotics(cefepime in ER) were administered. A preoperative time-out was performed.  ? ?A 21 French rigid cystoscope was used to intubate the urethra and thorough cystoscopy was performed.  Urine was frankly purulent and irrigated free.  Ureteral orifices were orthotopic bilaterally and there was significant erythema in the bladder consistent with cystitis.   ? ?I was able to intubate the left ureteral orifice with  a sensor wire and this was advanced up into the kidney.  There was a significant curl in the proximal ureter that required the aid of an access catheter to transverse.  The wire was removed and there was a hydronephrotic drip of purulent urine and this was sent for culture.  A 6 French by 26 cm ureteral stent was advanced over the wire with slightly excess curl up in the kidney, and an excellent curl in the bladder.  Purulent drainage noted through the side ports of the stent.  A 16 French Foley was placed to maximize drainage, and 10 mL placed in the balloon. ? ?Disposition: Stable to PACU ? ?Plan: ?Continue antibiotics and resuscitation per hospitalist service ?Foley can be removed when clinically improved ?We will arrange outpatient follow-up in 3 to 4 weeks for left ureteroscopy and stone removal once infection treated and clinically improved ? ?Legrand Rams, MD ? ?

## 2021-03-24 NOTE — Consult Note (Signed)
PHARMACY -  BRIEF ANTIBIOTIC NOTE  ? ?Pharmacy has received consult(s) for vancomycin and cefepime from an ED provider.  The patient's profile has been reviewed for ht/wt/allergies/indication/available labs.   ? ?One time order(s) placed for cefepime 2 g and vancomycin 2 g IV ? ?Further antibiotics/pharmacy consults should be ordered by admitting physician if indicated.       ?                ?Thank you, ?Darnelle Bos, PharmD ?03/24/2021  11:45 AM ? ?

## 2021-03-24 NOTE — ED Notes (Signed)
Pt came back from CT and had had a significantly sized bowel movement and had saturated bed in urine. Pt cleaned, full linen change, gown changed, fresh brief and chuck applied, and pt placed on new purewick.  ?

## 2021-03-24 NOTE — ED Notes (Signed)
Pt fully cleaned post sizeable bowel movement. Formed not watery. Pt placed in clean brief, clean sheets, clean chuck, purewick in place.  ?

## 2021-03-24 NOTE — Anesthesia Procedure Notes (Signed)
Procedure Name: Intubation ?Date/Time: 03/24/2021 6:27 PM ?Performed by: Tollie Eth, CRNA ?Pre-anesthesia Checklist: Patient identified, Patient being monitored, Timeout performed, Emergency Drugs available and Suction available ?Patient Re-evaluated:Patient Re-evaluated prior to induction ?Oxygen Delivery Method: Circle system utilized ?Preoxygenation: Pre-oxygenation with 100% oxygen ?Induction Type: IV induction ?Ventilation: Mask ventilation without difficulty ?Laryngoscope Size: 3 and McGraph ?Grade View: Grade I ?Tube type: Oral ?Tube size: 7.0 mm ?Number of attempts: 1 ?Airway Equipment and Method: Stylet and Video-laryngoscopy ?Placement Confirmation: ETT inserted through vocal cords under direct vision, positive ETCO2 and breath sounds checked- equal and bilateral ?Secured at: 19 cm ?Tube secured with: Tape ?Dental Injury: Teeth and Oropharynx as per pre-operative assessment  ? ? ? ? ?

## 2021-03-25 ENCOUNTER — Encounter: Payer: Self-pay | Admitting: Urology

## 2021-03-25 ENCOUNTER — Inpatient Hospital Stay
Admit: 2021-03-25 | Discharge: 2021-03-25 | Disposition: A | Discharge status: Home / Self Care | Payer: Medicare (Managed Care) | Attending: Urology | Admitting: Urology

## 2021-03-25 DIAGNOSIS — Q909 Down syndrome, unspecified: Secondary | ICD-10-CM

## 2021-03-25 DIAGNOSIS — B9620 Unspecified Escherichia coli [E. coli] as the cause of diseases classified elsewhere: Secondary | ICD-10-CM | POA: Diagnosis present

## 2021-03-25 DIAGNOSIS — N390 Urinary tract infection, site not specified: Secondary | ICD-10-CM | POA: Diagnosis present

## 2021-03-25 DIAGNOSIS — R112 Nausea with vomiting, unspecified: Secondary | ICD-10-CM

## 2021-03-25 DIAGNOSIS — J4520 Mild intermittent asthma, uncomplicated: Secondary | ICD-10-CM | POA: Diagnosis present

## 2021-03-25 DIAGNOSIS — R7881 Bacteremia: Secondary | ICD-10-CM | POA: Diagnosis present

## 2021-03-25 HISTORY — DX: Nausea with vomiting, unspecified: R11.2

## 2021-03-25 LAB — CBC
HCT: 36.5 % (ref 36.0–46.0)
Hemoglobin: 10.9 g/dL — ABNORMAL LOW (ref 12.0–15.0)
MCH: 30.6 pg (ref 26.0–34.0)
MCHC: 29.9 g/dL — ABNORMAL LOW (ref 30.0–36.0)
MCV: 102.5 fL — ABNORMAL HIGH (ref 80.0–100.0)
Platelets: 134 10*3/uL — ABNORMAL LOW (ref 150–400)
RBC: 3.56 MIL/uL — ABNORMAL LOW (ref 3.87–5.11)
RDW: 16 % — ABNORMAL HIGH (ref 11.5–15.5)
WBC: 27.4 10*3/uL — ABNORMAL HIGH (ref 4.0–10.5)
nRBC: 0 % (ref 0.0–0.2)

## 2021-03-25 LAB — BASIC METABOLIC PANEL
Anion gap: 7 (ref 5–15)
BUN: 24 mg/dL — ABNORMAL HIGH (ref 8–23)
CO2: 24 mmol/L (ref 22–32)
Calcium: 7.6 mg/dL — ABNORMAL LOW (ref 8.9–10.3)
Chloride: 114 mmol/L — ABNORMAL HIGH (ref 98–111)
Creatinine, Ser: 1.37 mg/dL — ABNORMAL HIGH (ref 0.44–1.00)
GFR, Estimated: 43 mL/min — ABNORMAL LOW (ref >60)
Glucose, Bld: 162 mg/dL — ABNORMAL HIGH (ref 70–99)
Potassium: 4.8 mmol/L (ref 3.5–5.1)
Sodium: 145 mmol/L (ref 135–145)

## 2021-03-25 LAB — BLOOD CULTURE ID PANEL (REFLEXED) - BCID2

## 2021-03-25 LAB — MRSA NEXT GEN BY PCR, NASAL: MRSA by PCR Next Gen: NOT DETECTED

## 2021-03-25 LAB — ECHOCARDIOGRAM COMPLETE
Height: 67 in
S' Lateral: 2.7 cm
Weight: 3492.09 oz

## 2021-03-25 LAB — STREP PNEUMONIAE URINARY ANTIGEN: Strep Pneumo Urinary Antigen: NEGATIVE

## 2021-03-25 LAB — LACTIC ACID, PLASMA
Lactic Acid, Venous: 3 mmol/L (ref 0.5–1.9)
Lactic Acid, Venous: 1.9 mmol/L (ref 0.5–1.9)

## 2021-03-25 NOTE — Evaluation (Signed)
Clinical/Bedside Swallow Evaluation Patient Details  Name: Lori Petersen [facility] MRN: [id number] Date of Birth: 04-03-1955  Today's Date: 03/25/2021 Time: SLP Start Time (ACUTE ONLY): 1030 SLP Stop Time (ACUTE ONLY): 1130 SLP Time Calculation (min) (ACUTE ONLY): 60 min  Past Medical History:  Past Medical History:  Diagnosis Date   Arthritis    lower spine   Asthma    Dementia (HCC)    Diastolic congestive heart failure (HCC)    "only has problems with bad colds"   Down's syndrome    Full dentures    does not wear   GERD (gastroesophageal reflux disease)    Hypothyroidism    Onychomycosis    Seizures (HCC)    Past Surgical History:  Past Surgical History:  Procedure Laterality Date   BREAST BIOPSY Left 04/05/2016   neg. cylinder clip   BREAST BIOPSY Left 12/06/2016   ribbon clip. path pending   COLONOSCOPY WITH PROPOFOL N/A 04/24/2016   Procedure: COLONOSCOPY WITH PROPOFOL;  Surgeon: Midge Minium, MD;  Location: [facility];  Service: Endoscopy;  Laterality: N/A;  Leave patient at 10:25 due to transportation   ESOPHAGOGASTRODUODENOSCOPY (EGD) WITH PROPOFOL N/A 04/24/2016   Procedure: ESOPHAGOGASTRODUODENOSCOPY (EGD) WITH PROPOFOL;  Surgeon: Midge Minium, MD;  Location: [facility];  Service: Endoscopy;  Laterality: N/A;   HERNIA REPAIR     HPI:  Pt is a 66 y.o. female with medical history significant of Down Syndrome, GERD, advanced Dementia, chronic HFpEF, asthma, seizure disorder, HLD, hypothyroidism, was sent from nursing home for evaluation of fever and new onset of hypoxia.  CT abdomen/pelvis later revealed left ureter obstructing stone and hydroureter hydronephrosis.  UA consistent with probable infection.  Urology was consulted and completed left ureteral stent placement on evening of 3/9.     Chest CT:  Cardiomegaly.  2. Main pulmonary trunk is dilated up to 3.7 cm. Prominence of the  pulmonary vessels.   3. Smooth interlobular septal thickening with ground-glass   attenuation of the lung parenchyma. The findings are concerning for  pulmonary vascular congestion with mild edema. No definite evidence  of pneumonia. No appreciable pleural effusion.    Assessment / Plan / Recommendation  Clinical Impression  Pt appears to present w/ grossly adequate oropharyngeal phase swallow function w/ trial consistencies assessed in setting of declined Cognitive status/Baseline Dementia, Down Syndrome, acuity of Illness, and Edentulous status. Any decline in Cognitive functioning can impact overall awareness/timing of swallow and safety during po tasks which increases risk for aspiration, choking. Pt did exhibit Impulsive drinking behavior and required monitoring of large sips during drinking -- pinched straw/pulling back to reduce. Pt's risk for aspiration is present but can be reduced when following general aspiration precautions and using a modified diet consistency w/ Supervision at meals.       Pt consumed several trials of ice chips, purees, and thin liquids via straw w/ No immediate, overt clinical s/s of aspiration noted; no decline in vocal quality; no cough, and no decline in respiratory status during/post trials. Pinched straw was used to reduce Impulsive drinking and large sips.  Oral phase was adequate for bolus management and oral clearing of the boluses given. Timely A-P transfer and oral phase time noted w/ purees and thin liquids; brisk swallows. Pt was able to help hold own Cup during drinking which improves safety of swallowing; increased awareness. OM Exam appeared [facility] w/ No unilateral weakness noted during lingual/labial movements w/ bolus management; unable to fully follow OM commands. Some confusion of  OM tasks and during oral care noted.       D/t pt's Baseline presentation as discussed above, recommend initiation of the dysphagia level 1(pureed foods moistened well) w/ thin liquids; general aspiration precautions; reduce Distractions during meals and engage pt  during po's at meal for self-feeding. Monitor Impulsive drinking and eating behaviors. Pills Crushed in Puree for safer swallowing. Support w/ feeding at meals. GERD precautions. No further skilled ST services indicated during this admit; f/u at her facility as needs indicate. MD/NSG updated and agreed. ST services recommends follow w/ Palliative Care for GOC support and education re: impact of Cognitive decline/Dementia on swallowing.  SLP Visit Diagnosis: Dysphagia, unspecified (R13.10) (baseline Down Syndrome; Dementia; Edentulous)    Aspiration Risk  Mild aspiration risk;Risk for inadequate nutrition/hydration (reduced when following general aspiration precautions)    Diet Recommendation   dysphagia level 1(pureed foods moistened well) w/ thin liquids; general aspiration precautions; reduce Distractions during meals and engage pt during po's at meal for self-feeding. Monitor Impulsive drinking and eating behaviors. Support w/ feeding at meals. GERD precautions.   Medication Administration: Crushed with puree    Other  Recommendations Recommended Consults:  (Dietician f/u) Oral Care Recommendations: Oral care BID;Oral care before and after PO;Staff/trained caregiver to provide oral care Other Recommendations:  (n/a)    Recommendations for follow up therapy are one component of a multi-disciplinary discharge planning process, led by the attending physician.  Recommendations may be updated based on patient status, additional functional criteria and insurance authorization.  Follow up Recommendations Follow physician's recommendations for discharge plan and follow up therapies      Assistance Recommended at Discharge Frequent or constant Supervision/Assistance  Functional Status Assessment Patient has had a recent decline in their functional status and demonstrates the ability to make significant improvements in function in a reasonable and predictable amount of time.  Frequency and Duration   (n/a)   (n/a)       Prognosis Prognosis for Safe Diet Advancement: Fair Barriers to Reach Goals: Cognitive deficits;Language deficits;Time post onset;Severity of deficits;Behavior Barriers/Prognosis Comment: baseline Dementia; Edentulous status; Down Syndrome      Swallow Study   General Date of Onset: 03/24/21 HPI: Pt is a 66 y.o. female with medical history significant of Down Syndrome, GERD, advanced Dementia, chronic HFpEF, asthma, seizure disorder, HLD, hypothyroidism, was sent from nursing home for evaluation of fever and new onset of hypoxia.  CT abdomen/pelvis later revealed left ureter obstructing stone and hydroureter hydronephrosis.  UA consistent with probable infection.  Urology was consulted and completed left ureteral stent placement on evening of 3/9.     Chest CT:  Cardiomegaly.  2. Main pulmonary trunk is dilated up to 3.7 cm. Prominence of the  pulmonary vessels.   3. Smooth interlobular septal thickening with ground-glass  attenuation of the lung parenchyma. The findings are concerning for  pulmonary vascular congestion with mild edema. No definite evidence  of pneumonia. No appreciable pleural effusion. Type of Study: Bedside Swallow Evaluation Previous Swallow Assessment: BSE 10/2018 (pureed diet but reportedly eating a regular diet at her facility (per the notes)) Diet Prior to this Study: NPO Temperature Spikes Noted: No (wbc trending down) Respiratory Status: Nasal cannula (3L) History of Recent Intubation: No Behavior/Cognition: Alert;Cooperative;Pleasant mood;Confused;Distractible;Requires cueing (min verbal, mostly "yeah" and approximations) Oral Cavity Assessment: Within Functional Limits Oral Care Completed by SLP: Yes Oral Cavity - Dentition: Edentulous (does NOT wear her dentures) Vision: Functional for self-feeding Self-Feeding Abilities: Able to feed self;Needs assist;Needs set up;Total assist (  held cup when drinking; able to use the spoon somewhat w/  setup) Patient Positioning: Upright in bed (needed full positioning) Baseline Vocal Quality:  (phonations mostly) Volitional Cough: Cognitively unable to elicit Volitional Swallow: Unable to elicit    Oral/Motor/Sensory Function Overall Oral Motor/Sensory Function: Within functional limits (during bolus management and oral clearing; slight open-mouth posture w/ tongue foward at rest)   Ice Chips Ice chips: Within functional limits Presentation: Spoon (fed; 3 trials)   Thin Liquid Thin Liquid: Within functional limits Presentation: Self Fed;Straw (supported; ~8 ozs total) Other Comments: min++ impulsive when drinking    Nectar Thick Nectar Thick Liquid: Not tested   Honey Thick Honey Thick Liquid: Not tested   Puree Puree: Within functional limits Presentation: Spoon (fed/she attempted; 4 ozs)   Solid     Solid: Not tested Other Comments: edentulous status; Cognitive status; acuity of illness         Jerilynn Som, MS, CCC-SLP Speech Language Pathologist Rehab Services; [facility] [phone] (ascom) Neena Beecham 03/25/2021,3:25 PM

## 2021-03-25 NOTE — Progress Notes (Signed)
?Progress Note ? ? ?Patient: Lori Petersen [facility] JKD:[id number] DOB: [birth year] DOA: 03/24/2021     1 ?DOS: the patient was seen and examined on 03/25/2021 ?  ?Brief hospital course: ?HPI on admission:  ?"Mattie Nordell [facility] is a 66 y.o. female with medical history significant of Down syndrome, advanced dementia, chronic HFpEF, asthma, seizure disorder, HLD, hypothyroidism, was sent from nursing home for evaluation of fever and new onset of hypoxia. ?  ?Patient has baseline dementia unable to provide any history, most history provided by ED staff and nursing home report.  Patient was found to have episode of vomiting yesterday, and this morning patient started to have a fever of 102 at nursing home as well as a new onset of hypoxia. ?  ?ED Course: Patient was found to be hypoxic 82% on room air, blood pressure on borderline low, was given IV bolus and blood pressure stabilized.  O2 saturations stabilized on 4 L 97%.  X-ray showed bilateral lower field infiltrates suspicious for pneumonia.  Blood work WBC 31, hemoglobin 12.4, creatinine 1.7 compared to baseline 1.1, K4.8 sodium 136.  Received vancomycin and cefepime." ? ?CT abdomen/pelvis later revealed left ureter obstructing stone and hydroureter hydronephrosis.  UA consistent with probable infection. ? ?Urology was consulted and took patient for left ureteral stent placement on evening of 3/9. ? ?Blood cultures now growing E. Coli.   ?On 2 g IV [place]. ? ?Assessment and Plan: ?* Complicated UTI (urinary tract infection) ?With obstructing left ureteral stone.  Urology consulted and placed a stent on the evening of 3/9. ?-- Continue 2 g Rocephin ?-- Follow blood and urine cultures ?-- Supportive care ?-- Monitor urine output ?-- Follow-up urology recommendation ? ?Bacteremia due to Escherichia coli ?Likely secondary to GU source with complicated UTI secondary to obstructing left ureteral stone. ?-- Continue 2 g Rocephin daily ?-- Repeat blood cultures tomorrow ?-- Monitor fever  curve and CBC ? ?Left ureteral stone ?Present on admission.  Urology consulted and placed left ureteral stent on 3/9. ?-- Follow-up urology's recommendations ? ?Septic shock ([place]) ?Patient met criteria for septic shock on admission with hypotension, leukocytosis, lactic acidosis above 4 in the setting of complicated UTI. ?3/10: BP remains soft but maps are sufficient. ?-- Trend lactate ?--Continue antibiotics as outlined ?-- Follow cultures ? ?Chronic diastolic CHF (congestive heart failure) ([place]) ?Echo this admission shows normal LVEF 55 to 09%, diastolic function could not be evaluated.   ?-- Given IV fluids per sepsis protocol ?--Appears mildly volume overloaded ?-- Will stop fluids this afternoon and monitor volume status.   ?-- No diuretics right now given hypotension and sepsis ? ?Acute respiratory failure with hypoxia ([place]) ?Present on admission with O2 sat of 82% on room air with associated shortness of breath.  Required 4 L/min supplemental oxygen in remission.  Initially suspected pneumonia but appears more likely due to pulmonary vascular congestion. ?-- Supplemental oxygen to maintain sats 88 to 94%, wean as tolerated ?-- Incentive spirometer if patient able ? ?Nausea & vomiting ?Due to complicated UTI and bacteremia.  Antiemetics as needed.   ? ? ?AKI (acute kidney injury) ([place]) ?Present on admission with creatinine 1.7.  Baseline creatinine around 1.1.  Likely due to obstructive uropathy. ?Started on IV fluids on admission. ?Creatinine improved 1.37 today. ?-- Stop IV fluids given hypoxia and pulmonary vascular congestion on chest imaging.  Resume if inadequate p.o. hydration ?-- Monitor BMP ?-- Avoid nephrotoxins and hypotension, renally dose meds where indicated ? ?Asthma, mild intermittent ?Chronic and stable,  no expiratory wheezes on exam.  Albuterol nebs as needed ? ?Hypothyroid ?Continue Synthroid ? ?Seizures ([place]) ?Continue Keppra ? ?Down syndrome ?Patient resides at [facility]. ?No apparent acute  issues. ? ?Mixed Alzheimer's and vascular dementia ([place]) ?Mental status at baseline. ?Delirium precautions. ? ? ? ? ?  ? ?Subjective: Patient seen this morning awake sitting up in bed.  She is nonverbal and unable to provide history or current complaints.  No acute events reported.  Does not appear in distress. ? ?Physical Exam: ?Vitals:  ? 03/24/21 2027 03/25/21 0357 03/25/21 0737 03/25/21 0835  ?BP: (!) 91/54 (!) 95/55  (!) 86/60  ?Pulse: 95 84  84  ?Resp: _0 ?Temp: 97.8 ?F (36.6 ?C) (!) 97.4 ?F (36.3 ?C)  97.8 ?F (36.6 ?C)  ?TempSrc:      ?SpO2: 99% 97% 97% 97%  ?Weight:      ?Height:      ? ?General exam: awake, alert, no acute distress, obese ?HEENT: Wearing nasal cannula, moist mucus membranes, hearing grossly normal  ?Respiratory system: CTAB with diminished bases, no wheezes, rales or rhonchi, normal respiratory effort. ?Cardiovascular system: normal S1/S2, RRR, 1-2+ lower extremity pitting edema bilaterally.   ?Gastrointestinal system: soft, nontender abdomen with bowel sounds present  ?Central nervous system: Exam limited by patient's dementia.  Alert.  Patient nonverbal unable to assess orientation.  No gross focal neurologic deficits. ?Extremities: Bilateral lower extremity edema, normal tone ?Skin: dry, intact, normal temperature ?Psychiatry: Normal mood flat affect ? ?Data Reviewed: ? ?Labs reviewed and notable for repeat lactic acid 3.0 improved from 4.1. BMP notable for chloride 114, glucose 162, BUN 24, creatinine improved to 1.37, calcium 7.6.  CBC showed white count 27.4, hemoglobin 10.9, platelets 134.  Strep pneumo urinary antigen negative.  MRSA PCR screen negative. ? ?Family Communication: None at bedside, will attempt to call as time allows ? ?Disposition: ?Status is: Inpatient ?Remains inpatient appropriate because: Severity of illness with sepsis due to complicated UTI on IV antibiotics pending cultures, remains hypotensive. ? ? Planned Discharge Destination: Skilled nursing  facility ? ? ? ?Time spent: 45 minutes ? ?Author: ?Ezekiel Slocumb, DO ?03/25/2021 2:51 PM ? ?For on call review [email or web address].  ?

## 2021-03-25 NOTE — Progress Notes (Signed)
?   03/24/21 1624  ?Assess: MEWS Score  ?Pulse Rate (!) 105  ?SpO2 95 %  ?O2 Device Nasal Cannula  ?O2 Flow Rate (L/min) 3 L/min  ?Assess: MEWS Score  ?MEWS Temp 0  ?MEWS Systolic 1  ?MEWS Pulse 1  ?MEWS RR 0  ?MEWS LOC 0  ?MEWS Score 2  ?MEWS Score Color Yellow  ?Assess: if the MEWS score is Yellow or Red  ?Were vital signs taken at a resting state? Yes  ?Focused Assessment No change from prior assessment  ?Does the patient meet 2 or more of the SIRS criteria? Yes  ?Does the patient have a confirmed or suspected source of infection? Yes  ?Provider and Rapid Response Notified? No  ?MEWS guidelines implemented *See Row Information* Yes  ?Treat  ?MEWS Interventions Escalated (See documentation below)  ?Pain Scale Faces  ?Faces Pain Scale 0  ?Take Vital Signs  ?Increase Vital Sign Frequency  Yellow: Q 2hr X 2 then Q 4hr X 2, if remains yellow, continue Q 4hrs  ?Escalate  ?MEWS: Escalate Yellow: discuss with charge nurse/RN and consider discussing with provider and RRT  ?Notify: Charge Nurse/RN  ?Name of Charge Nurse/RN Notified Andrill RN  ?Date Charge Nurse/RN Notified 03/24/21  ?Time Charge Nurse/RN Notified 1654  ?Assess: SIRS CRITERIA  ?SIRS Temperature  0  ?SIRS Pulse 1  ?SIRS Respirations  0  ?SIRS WBC 0  ?SIRS Score Sum  1  ? ? ?

## 2021-03-25 NOTE — Assessment & Plan Note (Addendum)
Patient met criteria for septic shock on admission with hypotension, leukocytosis, lactic acidosis above 4 in the setting of complicated UTI. Sepsis physiology improved. Lactic acidosis resolved.  3/13: BP remains soft but overall improved, MAP's stable.  --Continue antibiotics as outlined -- Follow cultures

## 2021-03-25 NOTE — Assessment & Plan Note (Signed)
Continue Synthroid °

## 2021-03-25 NOTE — Plan of Care (Signed)
  Problem: Clinical Measurements: Goal: Postoperative complications will be avoided or minimized Outcome: Progressing   Problem: Skin Integrity: Goal: Demonstration of wound healing without infection will improve Outcome: Progressing   

## 2021-03-25 NOTE — Assessment & Plan Note (Addendum)
Present on admission, appears resolved. ?Due to complicated UTI and bacteremia.  Antiemetics as needed.   ? ?

## 2021-03-25 NOTE — Assessment & Plan Note (Addendum)
Present on admission with O2 sat of 82% on room air with associated shortness of breath.  Required 4 L/min supplemental oxygen in remission.  Initially suspected pneumonia but appears more likely due to pulmonary vascular congestion. -- Supplemental oxygen to maintain sats 88 to 94%, wean as tolerated -- Incentive spirometer if patient able -- Resume home Lasix was withheld for AKI and hypotension -- Likely has underlying sleep apnea, would continue on nocturnal oxygen until sleep study can be performed if patient would tolerate CPAP.  Discussed with [facility] PCP.

## 2021-03-25 NOTE — Assessment & Plan Note (Signed)
Patient resides at [facility]. ?No apparent acute issues. ?

## 2021-03-25 NOTE — Progress Notes (Signed)
PHARMACY - PHYSICIAN COMMUNICATION ?CRITICAL VALUE ALERT - BLOOD CULTURE IDENTIFICATION (BCID) ? ?BCID results:  4 of 4 bottles with E. Coli, no resistance.  Pt already ordered Ceftriaxone 2 gm.  Pt also recently ordered Azithromycin for CAP. ? ?Name of provider contacted: Rachael Fee, NP ? ?Changes to prescribed antibiotics required: None ? ?Renda Rolls, PharmD, MBA ?03/25/2021 ?1:24 AM ?  ?

## 2021-03-25 NOTE — Assessment & Plan Note (Addendum)
Likely secondary to GU source with complicated UTI secondary to obstructing left ureteral stone. -- Continue 2 g Rocephin daily -- Repeat blood cultures - negative to date -- Monitor fever curve and CBC

## 2021-03-25 NOTE — TOC Progression Note (Signed)
Transition of Care (TOC) - Progression Note  ? ? ?Patient Details  ?Name: Lori Petersen [facility] ?MRN: [id number] ?Date of Birth: 1955-09-06 ? ?Transition of Care (TOC) CM/SW Contact  ?Marlowe Sax, RN ?Phone Number: ?03/25/2021, 11:05 AM ? ?Clinical Narrative:    ?Called and Left a general VM for a call back to Margurite Auerbach the legal guardian for the patient, the patient is a long term care resident at [facility] and [facility] is agreeable for the patient to return ? ? ?  ?  ? ?Expected Discharge Plan and Services ?  ?  ?  ?  ?  ?                ?  ?  ?  ?  ?  ?  ?  ?  ?  ?  ? ? ?Social Determinants of Health (SDOH) Interventions ?  ? ?Readmission Risk Interventions ?No flowsheet data found. ? ?

## 2021-03-25 NOTE — Assessment & Plan Note (Signed)
Chronic and stable, no expiratory wheezes on exam.  Albuterol nebs as needed ?

## 2021-03-25 NOTE — Assessment & Plan Note (Addendum)
Present on admission with creatinine 1.7.  Baseline creatinine around 1.1.  Likely due to obstructive uropathy. Started on IV fluids on admission. Creatinine normalized and stable off IV fluids.  -- Resume on home Lasix, reduced dose to 20 mg due to hypotension -- Monitor BMP -- Avoid nephrotoxins and hypotension, renally dose meds where indicated

## 2021-03-25 NOTE — TOC Progression Note (Signed)
Transition of Care (TOC) - Progression Note  ? ? ?Patient Details  ?Name: Lori Petersen [facility] ?MRN: [id number] ?Date of Birth: 11/11/1955 ? ?Transition of Care (TOC) CM/SW Contact  ?Marlowe Sax, RN ?Phone Number: ?03/25/2021, 5:09 PM ? ?Clinical Narrative:    ?Sheri Mouw with [facility] stopped by to visit [facility]. She he will coordinate all of patient transportation to and from facility and will get all medications for her. Sheri contact information is [phone] or call [phone]. To update her on her release date. ? ? ?Expected Discharge Plan: Long Term Nursing Home ?Barriers to Discharge: Continued Medical Work up ? ?Expected Discharge Plan and Services ?Expected Discharge Plan: Long Term Nursing Home ?  ?Discharge Planning Services: CM Consult ?  ?  ?                ?  ?  ?  ?  ?  ?  ?  ?  ?  ?  ? ? ?Social Determinants of Health (SDOH) Interventions ?  ? ?Readmission Risk Interventions ?No flowsheet data found. ? ?

## 2021-03-25 NOTE — Assessment & Plan Note (Signed)
Continue Keppra.

## 2021-03-25 NOTE — Progress Notes (Signed)
Nurse was notified of patient current vital signs by nurse tech. ?

## 2021-03-25 NOTE — Progress Notes (Signed)
*  PRELIMINARY RESULTS* ?Echocardiogram ?2D Echocardiogram has been performed. ? ?Lori Petersen, Dorene Sorrow ?03/25/2021, 9:34 AM ?

## 2021-03-25 NOTE — Assessment & Plan Note (Addendum)
With obstructing left ureteral stone.  Urology consulted and placed a stent on the evening of 3/9.  Urine culture returned no growth.  Initial blood culture grew pansensitive E. coli and Enterobacter species. -- Treated with 2 g Rocephin>> Ancef --4 additional days Cipro at discharge to complete course -- Monitor urine output -- Follow-up urology in 3 to 4 weeks

## 2021-03-25 NOTE — Assessment & Plan Note (Addendum)
Echo this admission shows normal LVEF 55 to 123456, diastolic function could not be evaluated.   -- Given IV fluids per sepsis protocol -- Appeared mildly volume overloaded @adm  -- Now off IV fluids  -- Lasix was held due to AKI and hypotension, resume

## 2021-03-25 NOTE — Hospital Course (Signed)
HPI on admission:  "Lori Petersen [facility] is a 66 y.o. female with medical history significant of Down syndrome, advanced dementia, chronic HFpEF, asthma, seizure disorder, HLD, hypothyroidism, was sent from nursing home for evaluation of fever and new onset of hypoxia.   Patient has baseline dementia unable to provide any history, most history provided by ED staff and nursing home report.  Patient was found to have episode of vomiting yesterday, and this morning patient started to have a fever of 102 at nursing home as well as a new onset of hypoxia.   ED Course: Patient was found to be hypoxic 82% on room air, blood pressure on borderline low, was given IV bolus and blood pressure stabilized.  O2 saturations stabilized on 4 L 97%.  X-ray showed bilateral lower field infiltrates suspicious for pneumonia.  Blood work WBC 31, hemoglobin 12.4, creatinine 1.7 compared to baseline 1.1, K4.8 sodium 136.  Received vancomycin and cefepime."  CT abdomen/pelvis later revealed left ureter obstructing stone and hydroureter hydronephrosis.  UA consistent with probable infection.  Urology was consulted and took patient for left ureteral stent placement on evening of 3/9.  Blood cultures now growing E. Coli.   On 2 g IV Rocphin.

## 2021-03-25 NOTE — Consult Note (Signed)
[name]: ?Reason for Consult: wounds on sacrum  ?Wound type:none ?Pressure Injury POA: NA ?Measurement:none ?Wound bed: reepithelialized skin  ?Drainage (amount, consistency, odor) none ?Periwound:intact  ?Dressing procedure/placement/frequency: ?None ? ?Re consult if needed, will not follow at this time. ?Thanks ? [name] [facility] MSN, RN,CWOCN, CNS, CWON-AP [phone])  ? ? ? ?  ?

## 2021-03-25 NOTE — Assessment & Plan Note (Addendum)
Present on admission.  Urology consulted and placed left ureteral stent on 3/9. --Follow-up urology's recommendations --Remove Foley today --Bladder scans to ensure not having urinary retention -- Follow-up with urology in 3 to 4 weeks

## 2021-03-25 NOTE — Assessment & Plan Note (Signed)
Mental status at baseline. ?Delirium precautions. ?

## 2021-03-26 DIAGNOSIS — E870 Hyperosmolality and hypernatremia: Secondary | ICD-10-CM | POA: Diagnosis not present

## 2021-03-26 DIAGNOSIS — D696 Thrombocytopenia, unspecified: Secondary | ICD-10-CM | POA: Diagnosis not present

## 2021-03-26 LAB — CBC
HCT: 35.4 % — ABNORMAL LOW (ref 36.0–46.0)
Hemoglobin: 10.7 g/dL — ABNORMAL LOW (ref 12.0–15.0)
MCH: 30.3 pg (ref 26.0–34.0)
MCHC: 30.2 g/dL (ref 30.0–36.0)
MCV: 100.3 fL — ABNORMAL HIGH (ref 80.0–100.0)
Platelets: 125 10*3/uL — ABNORMAL LOW (ref 150–400)
RBC: 3.53 MIL/uL — ABNORMAL LOW (ref 3.87–5.11)
RDW: 16.3 % — ABNORMAL HIGH (ref 11.5–15.5)
WBC: 25.2 10*3/uL — ABNORMAL HIGH (ref 4.0–10.5)
nRBC: 0 % (ref 0.0–0.2)

## 2021-03-26 LAB — URINE CULTURE: Culture: NO GROWTH

## 2021-03-26 LAB — BASIC METABOLIC PANEL
Anion gap: 5 (ref 5–15)
BUN: 26 mg/dL — ABNORMAL HIGH (ref 8–23)
CO2: 28 mmol/L (ref 22–32)
Calcium: 8 mg/dL — ABNORMAL LOW (ref 8.9–10.3)
Chloride: 115 mmol/L — ABNORMAL HIGH (ref 98–111)
Creatinine, Ser: 1.25 mg/dL — ABNORMAL HIGH (ref 0.44–1.00)
GFR, Estimated: 48 mL/min — ABNORMAL LOW (ref >60)
Glucose, Bld: 118 mg/dL — ABNORMAL HIGH (ref 70–99)
Potassium: 4.2 mmol/L (ref 3.5–5.1)
Sodium: 148 mmol/L — ABNORMAL HIGH (ref 135–145)

## 2021-03-26 LAB — MAGNESIUM: Magnesium: 2.4 mg/dL (ref 1.7–2.4)

## 2021-03-26 MED ORDER — CHLORHEXIDINE GLUCONATE CLOTH 2 % EX PADS
6.0000 | MEDICATED_PAD | Freq: Every day | CUTANEOUS | Status: DC
  Administered 2021-03-26 – 2021-03-29 (×3): 6 via TOPICAL

## 2021-03-26 MED ORDER — DEXTROSE 5 % IV SOLN: INTRAVENOUS | Status: DC

## 2021-03-26 MED ORDER — GUAIFENESIN 100 MG/5ML PO LIQD
5.0000 mL | ORAL | Status: DC | PRN
  Filled 2021-03-26: qty 10
  Filled 2021-03-26: qty 5

## 2021-03-26 MED ORDER — SODIUM CHLORIDE 0.9 % IV SOLN
750.0000 mg | Freq: Two times a day (BID) | INTRAVENOUS | Status: DC
  Administered 2021-03-26 – 2021-03-29 (×7): 750 mg via INTRAVENOUS
  Filled 2021-03-26 (×8): qty 7.5

## 2021-03-26 MED ORDER — ASPIRIN 81 MG PO CHEW
81.0000 mg | CHEWABLE_TABLET | Freq: Every day | ORAL | Status: DC
  Administered 2021-03-26 – 2021-03-29 (×4): 81 mg via ORAL
  Filled 2021-03-26 (×4): qty 1

## 2021-03-26 MED ORDER — PANTOPRAZOLE SODIUM 40 MG IV SOLR
40.0000 mg | Freq: Every day | INTRAVENOUS | Status: DC
  Administered 2021-03-26 – 2021-03-29 (×4): 40 mg via INTRAVENOUS
  Filled 2021-03-26 (×4): qty 10

## 2021-03-26 NOTE — Plan of Care (Signed)
?  Problem: Clinical Measurements: ?Goal: Postoperative complications will be avoided or minimized ?Outcome: Adequate for Discharge ?  ?Problem: Skin Integrity: ?Goal: Demonstration of wound healing without infection will improve ?Outcome: Adequate for Discharge ?  ?

## 2021-03-26 NOTE — Assessment & Plan Note (Addendum)
Sodium elevated 148 on 3/11, started on D5w infusion.  Likely poor p.o. intake, not drinking enough water.   3/13: Na improved 140 --Off D5w infusion -- Follow BMPs daily -- Encourage patient to drink water

## 2021-03-26 NOTE — Progress Notes (Signed)
?Progress Note ? ? ?Patient: Lori Petersen [facility] [id number] DOB: 08-16-1955 DOA: 03/24/2021     2 ?DOS: the patient was seen and examined on 03/26/2021 ?  ?Brief hospital course: ?HPI on admission:  ?"Ashelyn Mccravy [facility] is a 66 y.o. female with medical history significant of Down syndrome, advanced dementia, chronic HFpEF, asthma, seizure disorder, HLD, hypothyroidism, was sent from nursing home for evaluation of fever and new onset of hypoxia. ?  ?Patient has baseline dementia unable to provide any history, most history provided by ED staff and nursing home report.  Patient was found to have episode of vomiting yesterday, and this morning patient started to have a fever of 102 at nursing home as well as a new onset of hypoxia. ?  ?ED Course: Patient was found to be hypoxic 82% on room air, blood pressure on borderline low, was given IV bolus and blood pressure stabilized.  O2 saturations stabilized on 4 L 97%.  X-ray showed bilateral lower field infiltrates suspicious for pneumonia.  Blood work WBC 31, hemoglobin 12.4, creatinine 1.7 compared to baseline 1.1, K4.8 sodium 136.  Received vancomycin and cefepime." ? ?CT abdomen/pelvis later revealed left ureter obstructing stone and hydroureter hydronephrosis.  UA consistent with probable infection. ? ?Urology was consulted and took patient for left ureteral stent placement on evening of 3/9. ? ?Blood cultures now growing E. Coli.   ?On 2 g IV Rocphin. ? ?Assessment and Plan: ?* Complicated UTI (urinary tract infection) ?With obstructing left ureteral stone.  Urology consulted and placed a stent on the evening of 3/9. ?-- Continue 2 g Rocephin ?-- Follow blood and urine cultures ?-- Supportive care ?-- Monitor urine output ?-- Follow-up urology recommendation ? ?Bacteremia due to Escherichia coli ?Likely secondary to GU source with complicated UTI secondary to obstructing left ureteral stone. ?-- Continue 2 g Rocephin daily ?-- Repeat blood cultures ordered ?-- Monitor fever  curve and CBC ? ?Left ureteral stone ?Present on admission.  Urology consulted and placed left ureteral stent on 3/9. ?-- Follow-up urology's recommendations ? ?Septic shock ([place]) ?Patient met criteria for septic shock on admission with hypotension, leukocytosis, lactic acidosis above 4 in the setting of complicated UTI. ?3/11: BP remains soft but maps are sufficient.  Lactic acidosis resolved.  Sepsis physiology improved. ?-- Trend lactate ?--Continue antibiotics as outlined ?-- Follow cultures ? ?Chronic diastolic CHF (congestive heart failure) ([place]) ?Echo this admission shows normal LVEF 55 to 52%, diastolic function could not be evaluated.   ?-- Given IV fluids per sepsis protocol ?--Appears mildly volume overloaded ?-- Stop IV fluids afternoon of 3/10 ?-- No diuretics right now given hypotension and sepsis ? ?Acute respiratory failure with hypoxia ([place]) ?Present on admission with O2 sat of 82% on room air with associated shortness of breath.  Required 4 L/min supplemental oxygen in remission.  Initially suspected pneumonia but appears more likely due to pulmonary vascular congestion. ?-- Supplemental oxygen to maintain sats 88 to 94%, wean as tolerated ?-- Incentive spirometer if patient able ? ?Nausea & vomiting ?Due to complicated UTI and bacteremia.  Antiemetics as needed.   ? ? ?AKI (acute kidney injury) ([place]) ?Present on admission with creatinine 1.7.  Baseline creatinine around 1.1.  Likely due to obstructive uropathy. ?Started on IV fluids on admission. ? ?Creatinine improved 1.25 today. ?-- Stopped IV fluids 3/10 due to hypoxia and vascular congestion on chest imaging. ?-- Monitor BMP ?-- Avoid nephrotoxins and hypotension, renally dose meds where indicated ? ?Asthma, mild intermittent ?Chronic and stable, no  expiratory wheezes on exam.  Albuterol nebs as needed ? ?Hypothyroid ?Continue Synthroid ? ?Seizures (HCC) ?Continue Keppra ? ?Down syndrome ?Patient resides at SNF. ?No apparent acute  issues. ? ?Mixed Alzheimer's and vascular dementia (HCC) ?Mental status at baseline. ?Delirium precautions. ? ?Thrombocytopenia (HCC) ?Platelet count on admission 160k. ?Platelets have slowly down trended to 125k today ?Suspect due to sepsis. ?-- Monitor CBC ? ?Hypernatremia ?Sodium elevated 148 this morning.  Likely poor p.o. intake, not drinking enough water. ?-- Started on D5 ?-- Follow BMPs daily ?-- Encourage patient to drink water ? ? ? ? ?  ? ?Subjective: Patient awake being fed breakfast when seen today.  No acute events reported.  She was entirely nonverbal when I saw her yesterday.  Today she does answer some yes/no questions.  Denies pain or feeling sick.  Says feels okay ? ?Physical Exam: ?Vitals:  ? 03/25/21 1955 03/26/21 0421 03/26/21 0828 03/26/21 1127  ?BP: (!) 115/55 104/60 98/70 101/64  ?Pulse: 90 85 89 88  ?Resp: 18 16 20 20  ?Temp: 98.1 ?F (36.7 ?C) 98.7 ?F (37.1 ?C) 97.8 ?F (36.6 ?C) 98.2 ?F (36.8 ?C)  ?TempSrc:      ?SpO2: 100% 99% 95% 100%  ?Weight:      ?Height:      ? ?General exam: awake, alert, no acute distress, obese ?HEENT: moist mucus membranes, hearing grossly normal  ?Respiratory system: CTAB with diminished bases, no wheezes, rales or rhonchi, normal respiratory effort, on 3 L/min [place] O2. ?Cardiovascular system: normal S1/S2, RRR, no peripheral edema.   ?Gastrointestinal system: soft, NT, ND, no HSM felt, +bowel sounds. ?Central nervous system: Exam grossly nonfocal but limited by patient's dementia ?Skin: dry, intact, normal temperature ?Psychiatry: normal mood, congruent affect, judgement and insight appear normal ? ? ? ?Data Reviewed: ? ?Labs reviewed and notable for sodium 148, chloride 115, glucose 118, BUN 26, creatinine improved 1.25, calcium 8.0, lactic acidosis resolved 1.9.  CBC with slightly improved WBC 25.2, stable hemoglobin 10.7, platelets down slightly 125 ? ?Family Communication: None at bedside during encounter will attempt to call ? ?Disposition: ?Status is:  Inpatient ?Remains inpatient appropriate because: Severity of illness remaining on IV antibiotics pending cultures and on IV fluids for electrolyte abnormalities. ? ? Planned Discharge Destination: Skilled nursing facility ? ? ? ?Time spent: 35 minutes ? ?Author: ? A , DO ?03/26/2021 1:50 PM ? ?For on call review [email or web address].  ?

## 2021-03-26 NOTE — Assessment & Plan Note (Addendum)
Platelet count on admission 160k, down trended to nadir 125k. Platelets improved to 145k today. Suspect due to sepsis. -- Monitor CBC

## 2021-03-27 LAB — BASIC METABOLIC PANEL
Anion gap: 4 — ABNORMAL LOW (ref 5–15)
BUN: 23 mg/dL (ref 8–23)
CO2: 30 mmol/L (ref 22–32)
Calcium: 8.1 mg/dL — ABNORMAL LOW (ref 8.9–10.3)
Chloride: 108 mmol/L (ref 98–111)
Creatinine, Ser: 1.09 mg/dL — ABNORMAL HIGH (ref 0.44–1.00)
GFR, Estimated: 56 mL/min — ABNORMAL LOW (ref >60)
Glucose, Bld: 111 mg/dL — ABNORMAL HIGH (ref 70–99)
Potassium: 3.8 mmol/L (ref 3.5–5.1)
Sodium: 142 mmol/L (ref 135–145)

## 2021-03-27 LAB — BLOOD CULTURE ID PANEL (REFLEXED) - BCID2

## 2021-03-27 LAB — CBC WITH DIFFERENTIAL/PLATELET
Abs Immature Granulocytes: 0.68 10*3/uL — ABNORMAL HIGH (ref 0.00–0.07)
Basophils Absolute: 0.1 10*3/uL (ref 0.0–0.1)
Basophils Relative: 1 %
Eosinophils Absolute: 0.1 10*3/uL (ref 0.0–0.5)
Eosinophils Relative: 1 %
HCT: 35.6 % — ABNORMAL LOW (ref 36.0–46.0)
Hemoglobin: 10.8 g/dL — ABNORMAL LOW (ref 12.0–15.0)
Immature Granulocytes: 4 %
Lymphocytes Relative: 9 %
Lymphs Abs: 1.3 10*3/uL (ref 0.7–4.0)
MCH: 30.2 pg (ref 26.0–34.0)
MCHC: 30.3 g/dL (ref 30.0–36.0)
MCV: 99.4 fL (ref 80.0–100.0)
Monocytes Absolute: 0.7 10*3/uL (ref 0.1–1.0)
Monocytes Relative: 5 %
Neutro Abs: 12.5 10*3/uL — ABNORMAL HIGH (ref 1.7–7.7)
Neutrophils Relative %: 80 %
Platelets: 141 10*3/uL — ABNORMAL LOW (ref 150–400)
RBC: 3.58 MIL/uL — ABNORMAL LOW (ref 3.87–5.11)
RDW: 16.3 % — ABNORMAL HIGH (ref 11.5–15.5)
WBC: 15.4 10*3/uL — ABNORMAL HIGH (ref 4.0–10.5)
nRBC: 0 % (ref 0.0–0.2)

## 2021-03-27 LAB — CULTURE, BLOOD (ROUTINE X 2)
Special Requests: ADEQUATE
Special Requests: ADEQUATE

## 2021-03-27 MED ORDER — CEFAZOLIN SODIUM-DEXTROSE 2-4 GM/100ML-% IV SOLN
2.0000 g | Freq: Three times a day (TID) | INTRAVENOUS | Status: DC
  Administered 2021-03-27 – 2021-03-29 (×5): 2 g via INTRAVENOUS
  Filled 2021-03-27 (×5): qty 100

## 2021-03-27 NOTE — TOC Progression Note (Addendum)
Transition of Care (TOC) - Progression Note  ? ? ?Patient Details  ?Name: Lori Petersen [facility] ?MRN: [id number] ?Date of Birth: 1955/09/06 ? ?Transition of Care (TOC) CM/SW Contact  ?Kevyn Boquet, LCSW ?Phone Number: [phone] ?03/27/2021, 10:55 AM ? ?Clinical Narrative:    ? ?Patient resides at [facility] for long-term [facility], they are agreeable for patient to return.  Patient's main contact and legal guardian Cousin,[name]  [phone]. Patient active with [facility], Sheri contact information is [phone] or call [phone]. Sherri requested d/c update for [facility] to arrange transportation.  CSW contacted Attending requesting d/c update, possible d/c tomorrow 3/13 or Tuesday 3/14, pending cultures. ? ?Expected Discharge Plan: [place] ?Barriers to Discharge: Continued Medical Work up ? ?Expected Discharge Plan and Services ?Expected Discharge Plan: [place] ?  ?Discharge Planning Services: CM Consult ?  ?  ?                ?  ?  ?  ?  ?  ?  ?  ?  ?  ?  ? ? ?Social Determinants of Health (SDOH) Interventions ?  ? ?Readmission Risk Interventions ?No flowsheet data found. ? ?

## 2021-03-27 NOTE — Progress Notes (Signed)
PHARMACY - PHYSICIAN COMMUNICATION ?CRITICAL VALUE ALERT - BLOOD CULTURE IDENTIFICATION (BCID) ? ?Lori Petersen [facility] is an 66 y.o. female who presented to [facility] on 03/24/2021 with a chief complaint of SOB ? ?Assessment:  1/4 (anaerobic) MRSE ? ?Name of physician (or Provider) Contacted: Dr. Denton Lank ? ?Current antibiotics: Ceftriaxone 2 gram Q24H for E. Coli bacteremia ? ?Changes to prescribed antibiotics recommended: Suspect contaminant. Recommend continuing current abx regimen.  ?Patient is on recommended antibiotics - No changes needed ? ?Results for orders placed or performed during the hospital encounter of 03/24/21  ?Blood Culture ID Panel (Reflexed) (Collected: 03/26/2021  2:12 PM)  ?Result Value Ref Range  ? Enterococcus faecalis NOT DETECTED NOT DETECTED  ? Enterococcus Faecium NOT DETECTED NOT DETECTED  ? Listeria monocytogenes NOT DETECTED NOT DETECTED  ? Staphylococcus species DETECTED (A) NOT DETECTED  ? Staphylococcus aureus (BCID) NOT DETECTED NOT DETECTED  ? Staphylococcus epidermidis DETECTED (A) NOT DETECTED  ? Staphylococcus lugdunensis NOT DETECTED NOT DETECTED  ? Streptococcus species NOT DETECTED NOT DETECTED  ? Streptococcus agalactiae NOT DETECTED NOT DETECTED  ? Streptococcus pneumoniae NOT DETECTED NOT DETECTED  ? Streptococcus pyogenes NOT DETECTED NOT DETECTED  ? A.calcoaceticus-baumannii NOT DETECTED NOT DETECTED  ? Bacteroides fragilis NOT DETECTED NOT DETECTED  ? Enterobacterales NOT DETECTED NOT DETECTED  ? Enterobacter cloacae complex NOT DETECTED NOT DETECTED  ? Escherichia coli NOT DETECTED NOT DETECTED  ? Klebsiella aerogenes NOT DETECTED NOT DETECTED  ? Klebsiella oxytoca NOT DETECTED NOT DETECTED  ? Klebsiella pneumoniae NOT DETECTED NOT DETECTED  ? Proteus species NOT DETECTED NOT DETECTED  ? Salmonella species NOT DETECTED NOT DETECTED  ? Serratia marcescens NOT DETECTED NOT DETECTED  ? Haemophilus influenzae NOT DETECTED NOT DETECTED  ? Neisseria meningitidis NOT DETECTED NOT  DETECTED  ? Pseudomonas aeruginosa NOT DETECTED NOT DETECTED  ? Stenotrophomonas maltophilia NOT DETECTED NOT DETECTED  ? Candida albicans NOT DETECTED NOT DETECTED  ? Candida auris NOT DETECTED NOT DETECTED  ? Candida glabrata NOT DETECTED NOT DETECTED  ? Candida krusei NOT DETECTED NOT DETECTED  ? Candida parapsilosis NOT DETECTED NOT DETECTED  ? Candida tropicalis NOT DETECTED NOT DETECTED  ? Cryptococcus neoformans/gattii NOT DETECTED NOT DETECTED  ? Methicillin resistance mecA/C DETECTED (A) NOT DETECTED  ? ? ?Sharen Hones, PharmD, BCPS ?Clinical Pharmacist   ?03/27/2021  10:55 AM ? ?

## 2021-03-27 NOTE — Anesthesia Postprocedure Evaluation (Signed)
Anesthesia Post Note ? ?Patient: Lori Petersen ? ?Procedure(s) Performed: CYSTOSCOPY WITH STENT PLACEMENT (Left) ? ?Patient location during evaluation: PACU ?Anesthesia Type: General ?Level of consciousness: awake and alert ?Pain management: pain level controlled ?Vital Signs Assessment: post-procedure vital signs reviewed and stable ?Respiratory status: spontaneous breathing, nonlabored ventilation, respiratory function stable and patient connected to nasal cannula oxygen ?Cardiovascular status: blood pressure returned to baseline and stable ?Postop Assessment: no apparent nausea or vomiting ?Anesthetic complications: no ? ? ?No notable events documented. ? ? ?Last Vitals:  ?Vitals:  ? 03/27/21 0509 03/27/21 0756  ?BP: (!) 103/53 102/64  ?Pulse: 85 87  ?Resp: 16 18  ?Temp: 37.3 ?C 36.6 ?C  ?SpO2: 100% 99%  ?  ?Last Pain:  ?Vitals:  ? 03/26/21 2120  ?TempSrc:   ?PainSc: 0-No pain  ? ? ?  ?  ?  ?  ?  ?  ? ?Molli Barrows ? ? ? ? ?

## 2021-03-27 NOTE — Plan of Care (Signed)
  Problem: Clinical Measurements: Goal: Postoperative complications will be avoided or minimized Outcome: Progressing   Problem: Skin Integrity: Goal: Demonstration of wound healing without infection will improve Outcome: Progressing   

## 2021-03-27 NOTE — Progress Notes (Signed)
?Progress Note ? ? ?Patient: Lori Petersen [facility] NLG:[id number] DOB: 1955/11/14 DOA: 03/24/2021     3 ?DOS: the patient was seen and examined on 03/27/2021 ?  ?Brief hospital course: ?HPI on admission:  ?"Shiane Wenberg [facility] is a 65 y.o. female with medical history significant of Down syndrome, advanced dementia, chronic HFpEF, asthma, seizure disorder, HLD, hypothyroidism, was sent from nursing home for evaluation of fever and new onset of hypoxia. ?  ?Patient has baseline dementia unable to provide any history, most history provided by ED staff and nursing home report.  Patient was found to have episode of vomiting yesterday, and this morning patient started to have a fever of 102 at nursing home as well as a new onset of hypoxia. ?  ?ED Course: Patient was found to be hypoxic 82% on room air, blood pressure on borderline low, was given IV bolus and blood pressure stabilized.  O2 saturations stabilized on 4 L 97%.  X-ray showed bilateral lower field infiltrates suspicious for pneumonia.  Blood work WBC 31, hemoglobin 12.4, creatinine 1.7 compared to baseline 1.1, K4.8 sodium 136.  Received vancomycin and cefepime." ? ?CT abdomen/pelvis later revealed left ureter obstructing stone and hydroureter hydronephrosis.  UA consistent with probable infection. ? ?Urology was consulted and took patient for left ureteral stent placement on evening of 3/9. ? ?Blood cultures now growing E. Coli.   ?On 2 g IV Rocphin. ? ?Assessment and Plan: ?* Complicated UTI (urinary tract infection) ?With obstructing left ureteral stone.  Urology consulted and placed a stent on the evening of 3/9. ?-- Continue 2 g Rocephin ?-- Follow blood and urine cultures ?-- Supportive care ?-- Monitor urine output ?-- Follow-up urology recommendation ? ?Bacteremia due to Escherichia coli ?Likely secondary to GU source with complicated UTI secondary to obstructing left ureteral stone. ?-- Continue 2 g Rocephin daily ?-- Repeat blood cultures ordered ?-- Monitor fever  curve and CBC ? ?Left ureteral stone ?Present on admission.  Urology consulted and placed left ureteral stent on 3/9. ?-- Follow-up urology's recommendations ? ?Septic shock ([place]) ?Patient met criteria for septic shock on admission with hypotension, leukocytosis, lactic acidosis above 4 in the setting of complicated UTI. ?Sepsis physiology improved. ?Lactic acidosis resolved. ? ?3/12: BP remains soft but overall improved, MAP's stable. ? ?--Continue antibiotics as outlined ?-- Follow cultures ? ?Chronic diastolic CHF (congestive heart failure) ([place]) ?Echo this admission shows normal LVEF 55 to 08%, diastolic function could not be evaluated.   ?-- Given IV fluids per sepsis protocol ?-- Appeared mildly volume overloaded @adm  ?-- Now off IV fluids  ?-- No diuretics given hypotension and sepsis ? ?Acute respiratory failure with hypoxia ([place]) ?Present on admission with O2 sat of 82% on room air with associated shortness of breath.  Required 4 L/min supplemental oxygen in remission.  Initially suspected pneumonia but appears more likely due to pulmonary vascular congestion. ?-- Supplemental oxygen to maintain sats 88 to 94%, wean as tolerated ?-- Incentive spirometer if patient able ? ?Nausea & vomiting ?Due to complicated UTI and bacteremia.  Antiemetics as needed.   ? ? ?AKI (acute kidney injury) ([place]) ?Present on admission with creatinine 1.7.  Baseline creatinine around 1.1.  Likely due to obstructive uropathy. ?Started on IV fluids on admission. ? ?Creatinine improved 1.09 today. ?-- Stopped IV fluids 3/10 due to hypoxia and vascular congestion on chest imaging. ?-- Monitor BMP ?-- Avoid nephrotoxins and hypotension, renally dose meds where indicated ? ?Asthma, mild intermittent ?Chronic and stable, no expiratory wheezes on  exam.  Albuterol nebs as needed ? ?Hypothyroid ?Continue Synthroid ? ?Seizures ([place]) ?Continue Keppra ? ?Down syndrome ?Patient resides at [facility]. ?No apparent acute issues. ? ?Mixed Alzheimer's and  vascular dementia ([place]) ?Mental status at baseline. ?Delirium precautions. ? ?Thrombocytopenia ([place]) ?Platelet count on admission 160k. ?Platelets improved to 141k today. ?Suspect due to sepsis. ?-- Monitor CBC ? ?Hypernatremia ?Sodium elevated 148 on 3/11, started on D5w infusion.  Likely poor p.o. intake, not drinking enough water.   ?3/12: Na improved 142 ?-- Stop D5w ?-- Follow BMPs daily ?-- Encourage patient to drink water ? ? ? ? ?  ? ?Subjective: Patient awake sitting up in bed, had been coloring pictures.  She is a bit more talkative than previous days.  She denies pain or feeling sick.  No acute events reported.  Otherwise history limited by patient's dementia and Down syndrome. ? ?Physical Exam: ?Vitals:  ? 03/26/21 2011 03/27/21 0004 03/27/21 0509 03/27/21 0756  ?BP: 106/69 105/68 (!) 103/53 102/64  ?Pulse: (!) 110 (!) 101 85 87  ?Resp: 18 18 16 18   ?Temp: 98 ?F (36.7 ?C) 98.1 ?F (36.7 ?C) 99.2 ?F (37.3 ?C) 97.9 ?F (36.6 ?C)  ?TempSrc:      ?SpO2: (!) 85% 99% 100% 99%  ?Weight:      ?Height:      ? ?General exam: awake, alert, no acute distress, obese ?HEENT: moist mucus membranes, hearing grossly normal  ?Respiratory system: CTAB with diminished bases, no wheezes, rales or rhonchi, normal respiratory effort, on 3 L/min  O2 ?Cardiovascular system: normal S1/S2, RRR, no peripheral edema.   ?Gastrointestinal system: soft, NT, ND, no HSM felt, +bowel sounds. ?Central nervous system: Exam limited by patient's cognitive deficits but no gross focal neurologic deficits ?Skin: dry, intact, normal temperature, normal color, No rashes, lesions or ulcers seen on visualized skin ?Psychiatry: normal mood, flat affect, abnormal judgment and insight secondary to cognitive deficits ? ? ? ?Data Reviewed: ? ?Labs reviewed notable for glucose 111, creatinine 1.09, calcium 8.1, GFR 56, white count improved from 25.2 down to 15.4, platelets improved from 125 K up to 141 K.   ? ?Initial blood cultures grew E. coli with  susceptibilities still pending. ?Repeat blood cultures taken 3/11 growing staph epi, likely contaminated ? ? ? ?Family Communication: None ? ?Disposition: ?Status is: Inpatient ?Remains inpatient appropriate because: Severity of illness remaining on supplemental oxygen without requirement for this at baseline.  Monitoring renal function and electrolytes off IV fluids. ?Awaiting blood culture susceptibilities ? ? Planned Discharge Destination: Skilled nursing facility ? ? ? ?Time spent: 35 minutes ? ?Author: ?Ezekiel Slocumb, DO ?03/27/2021 1:27 PM ? ?For on call review [email or web address].  ?

## 2021-03-28 ENCOUNTER — Inpatient Hospital Stay: Payer: Medicare (Managed Care)

## 2021-03-28 LAB — BASIC METABOLIC PANEL
Anion gap: 7 (ref 5–15)
BUN: 20 mg/dL (ref 8–23)
CO2: 29 mmol/L (ref 22–32)
Calcium: 8.1 mg/dL — ABNORMAL LOW (ref 8.9–10.3)
Chloride: 104 mmol/L (ref 98–111)
Creatinine, Ser: 0.93 mg/dL (ref 0.44–1.00)
GFR, Estimated: >60 mL/min (ref >60)
Glucose, Bld: 128 mg/dL — ABNORMAL HIGH (ref 70–99)
Potassium: 4 mmol/L (ref 3.5–5.1)
Sodium: 140 mmol/L (ref 135–145)

## 2021-03-28 LAB — CBC
HCT: 35.1 % — ABNORMAL LOW (ref 36.0–46.0)
Hemoglobin: 10.8 g/dL — ABNORMAL LOW (ref 12.0–15.0)
MCH: 30.4 pg (ref 26.0–34.0)
MCHC: 30.8 g/dL (ref 30.0–36.0)
MCV: 98.9 fL (ref 80.0–100.0)
Platelets: 145 10*3/uL — ABNORMAL LOW (ref 150–400)
RBC: 3.55 MIL/uL — ABNORMAL LOW (ref 3.87–5.11)
RDW: 16.1 % — ABNORMAL HIGH (ref 11.5–15.5)
WBC: 10.3 10*3/uL (ref 4.0–10.5)
nRBC: 0 % (ref 0.0–0.2)

## 2021-03-28 LAB — RESP PANEL BY RT-PCR (FLU A&B, COVID) ARPGX2
Influenza A by PCR: NEGATIVE
Influenza B by PCR: NEGATIVE
SARS Coronavirus 2 by RT PCR: NEGATIVE

## 2021-03-28 LAB — CULTURE, BLOOD (ROUTINE X 2): Special Requests: ADEQUATE

## 2021-03-28 LAB — BRAIN NATRIURETIC PEPTIDE: B Natriuretic Peptide: 59 pg/mL (ref 0.0–100.0)

## 2021-03-28 LAB — LEGIONELLA PNEUMOPHILA SEROGP 1 UR AG: L. pneumophila Serogp 1 Ur Ag: NEGATIVE

## 2021-03-28 MED ORDER — FUROSEMIDE 40 MG PO TABS
40.0000 mg | ORAL_TABLET | Freq: Every day | ORAL | Status: DC
  Administered 2021-03-28: 40 mg via ORAL
  Filled 2021-03-28: qty 1

## 2021-03-28 MED ORDER — SODIUM CHLORIDE 0.9 % IV SOLN: INTRAVENOUS | Status: DC | PRN

## 2021-03-28 NOTE — Progress Notes (Signed)
?Progress Note ? ? ?Patient: Lori Petersen [facility] RWE:[id number] DOB: [birth year] DOA: 03/24/2021     4 ?DOS: the patient was seen and examined on 03/28/2021 ?  ?Brief hospital course: ?HPI on admission:  ?"Lori Petersen [facility] is a 66 y.o. female with medical history significant of Down syndrome, advanced dementia, chronic HFpEF, asthma, seizure disorder, HLD, hypothyroidism, was sent from nursing home for evaluation of fever and new onset of hypoxia. ?  ?Patient has baseline dementia unable to provide any history, most history provided by ED staff and nursing home report.  Patient was found to have episode of vomiting yesterday, and this morning patient started to have a fever of 102 at nursing home as well as a new onset of hypoxia. ?  ?ED Course: Patient was found to be hypoxic 82% on room air, blood pressure on borderline low, was given IV bolus and blood pressure stabilized.  O2 saturations stabilized on 4 L 97%.  X-ray showed bilateral lower field infiltrates suspicious for pneumonia.  Blood work WBC 31, hemoglobin 12.4, creatinine 1.7 compared to baseline 1.1, K4.8 sodium 136.  Received vancomycin and cefepime." ? ?CT abdomen/pelvis later revealed left ureter obstructing stone and hydroureter hydronephrosis.  UA consistent with probable infection. ? ?Urology was consulted and took patient for left ureteral stent placement on evening of 3/9. ? ?Blood cultures now growing E. Coli.   ?On 2 g IV Rocphin. ? ?Assessment and Plan: ?* Complicated UTI (urinary tract infection) ?With obstructing left ureteral stone.  Urology consulted and placed a stent on the evening of 3/9.  Urine culture returned no growth.  Initial blood culture grew E. coli and Enterobacter species. ?-- Continue 2 g Rocephin ?--Follow-up blood culture for susceptibilities ?-- Supportive care ?-- Monitor urine output ?-- Follow-up urology recommendation ? ?Bacteremia due to Escherichia coli ?Likely secondary to GU source with complicated UTI secondary to obstructing  left ureteral stone. ?-- Continue 2 g Rocephin daily ?-- Repeat blood cultures - negative to date ?-- Monitor fever curve and CBC ? ?Left ureteral stone ?Present on admission.  Urology consulted and placed left ureteral stent on 3/9. ?--Follow-up urology's recommendations ?--Remove Foley today ?--Bladder scans to ensure not having urinary retention ?-- Follow-up with urology in 3 to 4 weeks ? ?Septic shock ([place]) ?Patient met criteria for septic shock on admission with hypotension, leukocytosis, lactic acidosis above 4 in the setting of complicated UTI. ?Sepsis physiology improved. ?Lactic acidosis resolved. ? ?3/13: BP remains soft but overall improved, MAP's stable. ? ?--Continue antibiotics as outlined ?-- Follow cultures ? ?Chronic diastolic CHF (congestive heart failure) ([place]) ?Echo this admission shows normal LVEF 55 to 61%, diastolic function could not be evaluated.   ?-- Given IV fluids per sepsis protocol ?-- Appeared mildly volume overloaded @adm  ?-- Now off IV fluids  ?-- Lasix was held due to AKI and hypotension, resume ? ?Acute respiratory failure with hypoxia ([place]) ?Present on admission with O2 sat of 82% on room air with associated shortness of breath.  Required 4 L/min supplemental oxygen in remission.  Initially suspected pneumonia but appears more likely due to pulmonary vascular congestion. ?-- Supplemental oxygen to maintain sats 88 to 94%, wean as tolerated ?-- Incentive spirometer if patient able ?-- Resume home Lasix was withheld for AKI and hypotension ?-- Likely has underlying sleep apnea, would continue on nocturnal oxygen until sleep study can be performed if patient would tolerate CPAP.  Discussed with [facility] PCP. ? ?Nausea & vomiting ?Present on admission, appears resolved. ?Due to complicated  UTI and bacteremia.  Antiemetics as needed.   ? ? ?AKI (acute kidney injury) ([place]) ?Present on admission with creatinine 1.7.  Baseline creatinine around 1.1.  Likely due to obstructive  uropathy. ?Started on IV fluids on admission. ? ?Creatinine improved 0.93 today. ?-- Stopped IV fluids 3/10 due to hypoxia and vascular congestion on chest imaging. ?-- Resume home Lasix ?-- Monitor BMP ?-- Avoid nephrotoxins and hypotension, renally dose meds where indicated ? ?Asthma, mild intermittent ?Chronic and stable, no expiratory wheezes on exam.  Albuterol nebs as needed ? ?Hypothyroid ?Continue Synthroid ? ?Seizures ([place]) ?Continue Keppra ? ?Down syndrome ?Patient resides at [facility]. ?No apparent acute issues. ? ?Mixed Alzheimer's and vascular dementia ([place]) ?Mental status at baseline. ?Delirium precautions. ? ?Thrombocytopenia ([place]) ?Platelet count on admission 160k, down trended to nadir 125k. ?Platelets improved to 145k today. ?Suspect due to sepsis. ?-- Monitor CBC ? ?Hypernatremia ?Sodium elevated 148 on 3/11, started on D5w infusion.  Likely poor p.o. intake, not drinking enough water.   ?3/13: Na improved 140 ?--Off D5w infusion ?-- Follow BMPs daily ?-- Encourage patient to drink water ? ? ? ? ?  ? ?Subjective: Patient seen awake sitting up in bed, coloring pictures when seen this morning.  She denies pain or feeling sick.  Otherwise patient not able to provide any history.  No acute events reported. ? ?Physical Exam: ?Vitals:  ? 03/27/21 2357 03/28/21 0408 03/28/21 0812 03/28/21 1146  ?BP: 107/67 (!) 96/52 108/72 104/65  ?Pulse: 92 79 71 77  ?Resp: (!) 24 20 15 17   ?Temp: (!) 97.5 ?F (36.4 ?C) 98.1 ?F (36.7 ?C) 97.9 ?F (36.6 ?C) 97.7 ?F (36.5 ?C)  ?TempSrc: Axillary     ?SpO2: 100% 100% 100% 100%  ?Weight:      ?Height:      ? ?General exam: awake, alert, no acute distress, morbidly obese ?HEENT: moist mucus membranes, hearing grossly normal  ?Respiratory system: CTAB with diminished bases bilaterally, no wheezes, rales or rhonchi, normal respiratory effort. ?Cardiovascular system: normal S1/S2, RRR, trace bilateral lower extremity edema.   ?Gastrointestinal system: soft, nontender abdomen with bowel  sounds present  ?Central nervous system: no gross focal neurologic deficits, normal but minimal speech ?Extremities: Trace bilateral lower extremity edema, no edema, normal tone ?Skin: dry, intact, normal temperature ?Psychiatry: normal mood, flat affect ? ? ? ? ?Data Reviewed: ? ?Labs reviewed and notable for glucose 128, calcium 8.1, BNP normal 59.0, leukocytosis resolved with white count 10.3, platelet count improved from 141 up to 145, chest x-ray this morning Low volume film with bibasilar atelectasis or infiltrate. Underlying edema not excluded ? ?Family Communication: None.  Spoke with pace PCP, Lori Petersen this afternoon.  Updated on plan for Foley removal with voiding trial and attempt to wean oxygen with anticipated discharge of tomorrow.  She is agreeable ? ? ? ?Disposition: ?Status is: Inpatient ?Remains inpatient appropriate because: Severity of illness with ongoing soft blood pressures and hypoxia.  Resuming diuretic with close monitoring of BP.  Foley to be removed for voiding trial prior to discharge.  Anticipate discharge back to facility tomorrow. ? ? Planned Discharge Destination: Skilled nursing facility ? ? ? ?Time spent: 35 minutes ? ?Author: ?Ezekiel Slocumb, DO ?03/28/2021 2:12 PM ? ?For on call review [email or web address].  ?

## 2021-03-29 LAB — BASIC METABOLIC PANEL
Anion gap: 9 (ref 5–15)
BUN: 23 mg/dL (ref 8–23)
CO2: 31 mmol/L (ref 22–32)
Calcium: 8.3 mg/dL — ABNORMAL LOW (ref 8.9–10.3)
Chloride: 102 mmol/L (ref 98–111)
Creatinine, Ser: 1.02 mg/dL — ABNORMAL HIGH (ref 0.44–1.00)
GFR, Estimated: >60 mL/min (ref >60)
Glucose, Bld: 121 mg/dL — ABNORMAL HIGH (ref 70–99)
Potassium: 3.8 mmol/L (ref 3.5–5.1)
Sodium: 142 mmol/L (ref 135–145)

## 2021-03-29 LAB — MYCOPLASMA PNEUMONIAE ANTIBODY, IGM: Mycoplasma pneumo IgM: <770 U/mL (ref 0–769)

## 2021-03-29 MED ORDER — CEPHALEXIN 500 MG PO CAPS: 500.0000 mg | ORAL_CAPSULE | Freq: Four times a day (QID) | ORAL | 0 refills | Status: DC

## 2021-03-29 MED ORDER — CIPROFLOXACIN HCL 500 MG PO TABS: 500.0000 mg | ORAL_TABLET | Freq: Two times a day (BID) | ORAL | 0 refills | Status: AC

## 2021-03-29 MED ORDER — FUROSEMIDE 20 MG PO TABS
20.0000 mg | ORAL_TABLET | Freq: Every day | ORAL | Status: DC
  Administered 2021-03-29: 20 mg via ORAL
  Filled 2021-03-29: qty 1

## 2021-03-29 NOTE — TOC Progression Note (Signed)
Transition of Care (TOC) - Progression Note  ? ? ?Patient Details  ?Name: Lori Petersen [facility] ?MRN: [id number] ?Date of Birth: 1955-05-25 ? ?Transition of Care (TOC) CM/SW Contact  ?Marlowe Sax, RN ?Phone Number: ?03/29/2021, 10:01 AM ? ?Clinical Narrative:   Spoke with the physician at [facility], they will pick up the patient in the wheelchair Zenaida Niece today, to call [phone] when she is ready to pick up ? ? ? ?Expected Discharge Plan: Long Term Nursing Home ?Barriers to Discharge: Continued Medical Work up ? ?Expected Discharge Plan and Services ?Expected Discharge Plan: Long Term Nursing Home ?  ?Discharge Planning Services: CM Consult ?  ?  ?                ?  ?  ?  ?  ?  ?  ?  ?  ?  ?  ? ? ?Social Determinants of Health (SDOH) Interventions ?  ? ?Readmission Risk Interventions ?No flowsheet data found. ? ?

## 2021-03-29 NOTE — [place] FL2 (Signed)
?[place] MEDICAID FL2 LEVEL OF CARE SCREENING TOOL  ?  ? ?IDENTIFICATION  ?Patient Name: ?Lori Petersen [facility] Birthdate: 01/02/1956 Sex: female Admission Date (Current Location): ?03/24/2021  ?[place] Number: ? [place] ?  Facility and Address:  ?[facility], [address], [place], [place] [address] ?     Provider Number: ?[id number]  ?Attending Physician Name and Address:  ?Pennie Banter, DO ? Relative Name and Phone Number:  ?Nicole Cella [phone] ?   ?Current Level of Care: ?Hospital Recommended Level of Care: ?Nursing Facility Prior Approval Number: ?  ? ?Date Approved/Denied: ?  PASRR Number: ?  ? ?Discharge Plan: ? ([facility]) ?  ? ?Current Diagnoses: ?Patient Active Problem List  ? Diagnosis Date Noted  ? Hypernatremia 03/26/2021  ? Thrombocytopenia (HCC) 03/26/2021  ? Asthma, mild intermittent 03/25/2021  ? Nausea & vomiting 03/25/2021  ? Bacteremia due to Escherichia coli 03/25/2021  ? Complicated UTI (urinary tract infection) 03/25/2021  ? Down syndrome 03/25/2021  ? AKI (acute kidney injury) (HCC) 03/24/2021  ? Septic shock (HCC)   ? Left ureteral stone   ? Seizures (HCC) 12/03/2018  ? Obesity, Class III, 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  ? Chronic diastolic CHF (congestive heart failure) (HCC) 11/06/2018  ? Acute metabolic encephalopathy 11/06/2018  ? Hypothyroid 11/06/2018  ? COVID-19 virus infection 11/03/2018  ? COVID-19   ? Acute respiratory failure with hypoxia (HCC) 11/02/2018  ? Left tibial fracture 04/27/2017  ? Right tibial fracture 04/27/2017  ? Problems with swallowing and mastication   ? Blood in stool   ? Bradycardia 05/14/2015  ? 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 Onychomycosis   ? ? ?Orientation RESPIRATION BLADDER Height & Weight   ?  ?Self ? Normal Continent, External catheter Weight: 99 kg ?Height:  5\' 7"  (170.2 cm)  ?BEHAVIORAL SYMPTOMS/MOOD NEUROLOGICAL BOWEL NUTRITION STATUS  ?    Continent Diet (see  DC summary)  ?AMBULATORY STATUS COMMUNICATION OF NEEDS Skin   ?Extensive Assist Verbally Normal, Surgical wounds ?  ?  ?  ?    ?     ?     ? ? ?Personal Care Assistance Level of Assistance  ?Total care   ?  ?  ?Total Care Assistance: Maximum assistance  ? ?Functional Limitations Info  ?    ?  ?   ? ? ?SPECIAL CARE FACTORS FREQUENCY  ?PT (By licensed PT), OT (By licensed OT)   ?  ?PT Frequency: 5 times per week ?OT Frequency: 5 times per week ?  ?  ?  ?   ? ? ?Contractures Contractures Info: Not present  ? ? ?Additional Factors Info  ?Code Status, Allergies Code Status Info: full code ?Allergies Info: NKDA ?  ?  ?  ?   ? ?Current Medications (03/29/2021):  This is the current hospital active medication list ?Current Facility-Administered Medications  ?Medication Dose Route Frequency Provider Last Rate Last Admin  ? 0.9 %  sodium chloride infusion   Intravenous PRN 03/31/2021 A, DO 10 mL/hr at 03/29/21 03/31/21 Restarted at 03/29/21 03/31/21  ? acetaminophen (TYLENOL) tablet 650 mg  650 mg Oral Q6H PRN 6283, MD      ? Or  ? acetaminophen (TYLENOL) suppository 650 mg  650 mg Rectal Q6H PRN Sondra Come, MD      ? albuterol (PROVENTIL) (2.5 MG/3ML) 0.083% nebulizer solution 2.5 mg  2.5 mg Nebulization Q4H PRN Sondra Come, MD      ?  aspirin chewable tablet 81 mg  81 mg Oral Daily Esaw Grandchild A, DO   81 mg at 03/29/21 1000  ? ceFAZolin (ANCEF) IVPB 2g/100 mL premix  2 g Intravenous Q8H Esaw Grandchild A, DO   Stopped at 03/29/21 1610  ? Chlorhexidine Gluconate Cloth 2 % PADS 6 each  6 each Topical Daily Esaw Grandchild A, DO   6 each at 03/27/21 1010  ? enoxaparin (LOVENOX) injection 50 mg  0.5 mg/kg Subcutaneous Q24H Sondra Come, MD   50 mg at 03/28/21 2201  ? furosemide (LASIX) tablet 20 mg  20 mg Oral Daily Esaw Grandchild A, DO   20 mg at 03/29/21 1000  ? guaiFENesin (ROBITUSSIN) 100 MG/5ML liquid 5 mL  5 mL Oral Q4H PRN Esaw Grandchild A, DO      ? latanoprost (XALATAN) 0.005 % ophthalmic  solution 1 drop  1 drop Left Eye QHS Sondra Come, MD   1 drop at 03/28/21 2231  ? levETIRAcetam (KEPPRA) 750 mg in sodium chloride 0.9 % 100 mL IVPB  750 mg Intravenous Q12H Esaw Grandchild A, DO   Stopped at 03/29/21 1052  ? levothyroxine (SYNTHROID) tablet 100 mcg  100 mcg Oral QAC breakfast Sondra Come, MD   100 mcg at 03/29/21 9604  ? ondansetron (ZOFRAN) tablet 4 mg  4 mg Oral Q6H PRN Sondra Come, MD      ? Or  ? ondansetron (ZOFRAN) injection 4 mg  4 mg Intravenous Q6H PRN Sondra Come, MD   4 mg at 03/24/21 1830  ? pantoprazole (PROTONIX) injection 40 mg  40 mg Intravenous Daily Esaw Grandchild A, DO   40 mg at 03/29/21 1000  ? pravastatin (PRAVACHOL) tablet 40 mg  40 mg Oral Daily Sondra Come, MD   40 mg at 03/28/21 2153  ? senna (SENOKOT) tablet 17.2 mg  2 tablet Oral QHS Sondra Come, MD   17.2 mg at 03/28/21 2153  ? ? ? ?Discharge Medications: ?Please see discharge summary for a list of discharge medications. ? ?Relevant Imaging Results: ? ?Relevant Lab Results: ? ? ?Additional Information ?SSN [id number] ? ?Marlowe Sax, RN ? ? ? ? ?

## 2021-03-29 NOTE — Care Management Important Message (Signed)
Important Message ? ?Patient Details  ?Name: Lori Petersen ?MRN: [id number] ?Date of Birth: 1956-01-02 ? ? ?Medicare Important Message Given:  Other (see comment) ? ?Left message for legal guardian, Jilda Roche, [phone] to call me back at her convenience to review the Important Message from [facility].  Will await a return call.  ? ? ?[name] ?03/29/2021, 11:12 AM ?

## 2021-03-29 NOTE — Discharge Summary (Addendum)
?Physician Discharge Summary ?  ?Patient: [place] MRN: [id number] DOB: 03/24/1955  ?Admit date:     03/24/2021  ?Discharge date: 03/29/21  ?Discharge Physician: Ezekiel Slocumb  ? ?PCP: [facility] [place]  ? ?Recommendations at discharge:  ? ?Follow-up with primary care within 1 week ?Repeat BMP/Mg/CBC within 1 week ?Follow-up on blood pressure.  Lasix dose reduced to 20 mg due to soft BPs.  Consider Lasix use as needed. ?Follow-up with urology in 3 to 4 weeks ?Recommend nocturnal oxygen and consider outpatient sleep study if patient will tolerate CPAP.  Likely has underlying sleep apnea. ? ?Discharge Diagnoses: ?Principal Problem: ?  Complicated UTI (urinary tract infection) ?Active Problems: ?  Septic shock ([place]) ?  Left ureteral stone ?  Bacteremia due to Escherichia coli ?  Acute respiratory failure with hypoxia ([place]) ?  Chronic diastolic CHF (congestive heart failure) ([place]) ?  AKI (acute kidney injury) ([place]) ?  Nausea & vomiting ?  Asthma, mild intermittent ?  Hypothyroid ?  Seizures ([place]) ?  Mixed Alzheimer's and vascular dementia ([place]) ?  Down syndrome ?  Hypernatremia ?  Thrombocytopenia ([place]) ? ? ?Hospital Course: ?HPI on admission:  ?"Riya Huxford [facility] is a 66 y.o. female with medical history significant of Down syndrome, advanced dementia, chronic HFpEF, asthma, seizure disorder, HLD, hypothyroidism, was sent from nursing home for evaluation of fever and new onset of hypoxia. ?  ?Patient has baseline dementia unable to provide any history, most history provided by ED staff and nursing home report.  Patient was found to have episode of vomiting yesterday, and this morning patient started to have a fever of 102 at nursing home as well as a new onset of hypoxia. ?  ?ED Course: Patient was found to be hypoxic 82% on room air, blood pressure on borderline low, was given IV bolus and blood pressure stabilized.  O2 saturations stabilized on 4 L 97%.  X-ray showed bilateral lower field  infiltrates suspicious for pneumonia.  Blood work WBC 31, hemoglobin 12.4, creatinine 1.7 compared to baseline 1.1, K4.8 sodium 136.  Received vancomycin and cefepime." ? ?CT abdomen/pelvis later revealed left ureter obstructing stone and hydroureter hydronephrosis.  UA consistent with probable infection. ? ?Urology was consulted and took patient for left ureteral stent placement on evening of 3/9. ? ?Blood cultures now growing E. Coli.   ?Started on 2 g IV [place]. ?Transitioned to Ancef once susceptibilities returned pansensitive E. coli. ?We will discharge on Cipro to complete 10-day course. ? ?Assessment and Plan: ?* Complicated UTI (urinary tract infection) ?With obstructing left ureteral stone.  Urology consulted and placed a stent on the evening of 3/9.  Urine culture returned no growth.  Initial blood culture grew pansensitive E. coli and Enterobacter species. ?-- Treated with 2 g Rocephin>> Ancef ?--4 additional days Cipro at discharge to complete course ?-- Monitor urine output ?-- Follow-up urology in 3 to 4 weeks ? ?Bacteremia due to Escherichia coli ?Likely secondary to GU source with complicated UTI secondary to obstructing left ureteral stone. ?-- Treated with 2 g Rocephin daily ?--Transitioned to Ancef ?--Complete course with 4 more days CIPRO at discharge ? ?Left ureteral stone ?Present on admission.  Urology consulted and placed left ureteral stent on 3/9. ?--Follow-up urology's recommendations ?-- Foley catheter removed on 3/13 and appears patient is voiding well ?-- Monitor for urinary retention ?-- Follow-up with urology in 3 to 4 weeks ? ?Septic shock ([place]) ?Patient met criteria for septic shock on admission with  hypotension, leukocytosis, lactic acidosis above 4 in the setting of complicated UTI. ?Sepsis physiology improved. ?Lactic acidosis resolved. ?3/14: BPs overall stable but remain soft.  Lasix dose reduced. ?Monitor blood pressures at SNF.   ?Close PCP follow-up ? ?Chronic diastolic CHF  (congestive heart failure) ([place]) ?Echo this admission shows normal LVEF 55 to 40%, diastolic function could not be evaluated.   ?-- Given IV fluids per sepsis protocol ?-- Appeared mildly volume overloaded @adm  ?-- Now off IV fluids  ?-- Lasix was held due to AKI and hypotension, resume resumed but dose reduced to 20 mg due to hypotension ?--Close PCP follow-up and consider Lasix use only as needed ? ?Acute respiratory failure with hypoxia ([place]) ?Present on admission with O2 sat of 82% on room air with associated shortness of breath.  Required 4 L/min supplemental oxygen in remission.  Initially suspected pneumonia but appears more likely due to pulmonary vascular congestion. ?3/14: Intermittently requires 2 L/min O2 ?-- Supplemental O2 PRN to maintain sats 88 to 94% ?-- Likely has underlying sleep apnea, would continue on nocturnal oxygen until sleep study can be performed if patient would tolerate CPAP.  Discussed with [facility] PCP. ? ?Nausea & vomiting ?Present on admission, appears resolved. ?Due to complicated UTI and bacteremia.  Antiemetics as needed.   ? ? ?AKI (acute kidney injury) ([place]) ?Present on admission with creatinine 1.7.  Baseline creatinine around 1.1.  Likely due to obstructive uropathy. ?Started on IV fluids on admission. ?Creatinine normalized and stable off IV fluids. ? ?-- Resume on home Lasix, reduced dose to 20 mg due to hypotension ?-- Monitor BMP ?-- Avoid nephrotoxins and hypotension, renally dose meds where indicated ? ?Asthma, mild intermittent ?Chronic and stable, no expiratory wheezes on exam.  Albuterol nebs as needed ? ?Hypothyroid ?Continue Synthroid ? ?Seizures ([place]) ?Continue Keppra ? ?Down syndrome ?Patient resides at [facility]. ?No apparent acute issues. ? ?Mixed Alzheimer's and vascular dementia ([place]) ?Mental status at baseline. ?Delirium precautions. ? ?Thrombocytopenia ([place]) ?Platelet count on admission 160k, down trended to nadir 125k. ?Platelets improved to 145k. ?Suspect due to  sepsis. ?-- Monitor CBC at follow-up ? ?Hypernatremia ?Sodium elevated 148 on 3/11, started on D5w infusion.  Likely poor p.o. intake, not drinking enough water.   ?Sodium level normalized with D5w and now stable off of fluids. ?-- Repeat BMP in about 1 week ?-- Encourage patient to drink water ? ? ? ? ?  ? ? ?Consultants: Urology ?Procedures performed: Ureteral stent placement ?Disposition: Skilled nursing facility ? ?Diet recommendation:  ?Dysphagia type 1 Thin Liquid ? ? ?DISCHARGE MEDICATION: ?Allergies as of 03/29/2021   ?No Known Allergies ?  ? ?  ?Medication List  ?  ? ?TAKE these medications   ? ?acetaminophen 325 MG tablet ?Commonly known as: TYLENOL ?Take 650 mg by mouth every 6 (six) hours as needed. ?  ?albuterol 108 (90 Base) MCG/ACT inhaler ?Commonly known as: VENTOLIN HFA ?Inhale 1 puff into the lungs every 4 (four) hours as needed for wheezing or shortness of breath. ?  ?aspirin EC 81 MG tablet ?Take 81 mg by mouth daily. ?  ?bisacodyl 5 MG EC tablet ?Commonly known as: DULCOLAX ?Take 5 mg by mouth daily. ?  ?budesonide-formoterol 80-4.5 MCG/ACT inhaler ?Commonly known as: SYMBICORT ?Inhale 2 puffs into the lungs 2 (two) times daily. ?  ?cholecalciferol 25 MCG (1000 UNIT) tablet ?Commonly known as: VITAMIN D3 ?Take 1,000 Units by mouth daily. ?  ?ciprofloxacin 500 MG tablet ?Commonly known as: Cipro ?Take 1 tablet (500  mg total) by mouth 2 (two) times daily for 4 days. ?  ?empagliflozin 10 MG Tabs tablet ?Commonly known as: JARDIANCE ?Take 10 mg by mouth daily. ?  ?furosemide 40 MG tablet ?Commonly known as: LASIX ?Take 40 mg by mouth daily. ?  ?Inulin 2 g Chew ?Chew 2 g by mouth in the morning. ?  ?latanoprost 0.005 % ophthalmic solution ?Commonly known as: XALATAN ?Place 1 drop into the left eye at bedtime. ?  ?levETIRAcetam 250 MG tablet ?Commonly known as: KEPPRA ?Take 250 mg by mouth 2 (two) times daily. (Take with 590m tablet to equal 7528mtotal) ?  ?levETIRAcetam 500 MG tablet ?Commonly known  as: KEPPRA ?Take 500 mg by mouth 2 (two) times daily. (Take with 25050mablet to equal 750m42mtal) ?  ?levothyroxine 100 MCG tablet ?Commonly known as: SYNTHROID ?Take 100 mcg by mouth daily before breakfast

## 2021-03-30 ENCOUNTER — Other Ambulatory Visit: Payer: Self-pay | Admitting: Urology

## 2021-03-30 DIAGNOSIS — N201 Calculus of ureter: Secondary | ICD-10-CM

## 2021-03-30 NOTE — Progress Notes (Signed)
Surgical Physician Order Form [facility] [place] ? ?* Scheduling expectation : 2-4 weeks ? ?*Length of Case: 45 minutes ? ?*Clearance needed: no ? ?*Anticoagulation Instructions: May continue all anticoagulants ? ?*Aspirin Instructions: Ok to continue all ? ?*Post-op visit Date/Instructions: TBD ? ?*Diagnosis: Left Ureteral Stone ? ?*Procedure: left  Ureteroscopy w/laser lithotripsy & stent exchange (32202) ? ? ?Additional orders: N/A ? ?-Admit type: OUTpatient ? ?-Anesthesia: General ? ?-VTE Prophylaxis Standing Order SCD?s    ?   ?Other:  ? ?-Standing Lab Orders Per Anesthesia   ? ?Lab other: UA&Urine Culture ? ?-Standing Test orders EKG/Chest x-ray per Anesthesia      ? ?Test other:  ? ?- Medications:  Ancef 2gm IV ? ?-Other orders:  N/A ? ?FYI patient with severe dementia, lives in facility at baseline, legal guardian is her sister Nicole Cella Cousin(# listed in chart) ? ?  ? ?

## 2021-03-31 LAB — CULTURE, BLOOD (ROUTINE X 2)
Culture: NO GROWTH
Special Requests: ADEQUATE

## 2021-04-01 LAB — CULTURE, BLOOD (SINGLE)
Culture: NO GROWTH
Special Requests: ADEQUATE

## 2021-04-12 ENCOUNTER — Telehealth: Payer: Self-pay

## 2021-04-12 NOTE — Telephone Encounter (Signed)
I spoke with Karie Mainland ([facility] Child psychotherapist) on behalf of [name]. We have discussed possible surgery dates and Friday April 21st, 2023 was agreed upon by all parties. Patient given information about surgery date, what to expect pre-operatively and post operatively.  ?We discussed that a Pre-Admission Testing office will be calling to set up the pre-op visit that will take place prior to surgery, and that these appointments are typically done over the phone with a Pre-Admissions RN. ? Informed patient that our office will communicate any additional care to be provided after surgery. Patients questions or concerns were discussed during our call. Advised to call our office should there be any additional information, questions or concerns that arise. Patient verbalized understanding.  ? ?

## 2021-04-12 NOTE — Progress Notes (Addendum)
[facility] Posting Form  ? ?Surgery Date/Time: 06/24/2021 ? ?Surgeon: Dr. Legrand Rams, MD ? ?Surgery Location: Day Surgery ? ?Inpt ( No  )   Outpt (Yes)   Obs ( No  )  ? ?Diagnosis: N20.1 Left Ureteral Stone ? ?-CPT: 52356 ? ?Surgery: Left Ureteroscopy with laser lithotripsy and stent exchange ? ?Stop Anticoagulations: No, may continue ASA ? ?Cardiac/Medical/Pulmonary Clearance needed: no ? ?*Orders entered into EPIC  Date: 04/12/21  ? ?*Case booked in [place]  Date: 04/06/2021 ? ?*Notified pt of Surgery: Date: 04/06/2021 ? ?PRE-OP UA & CX: Will obtain at patients facility and fax [place] results ? ?*Placed into Prior Authorization Work Que Date: 04/12/21 ? ? ?Assistant/laser/rep:No ? ?Rescheduled from 04/28- due to facility not making patient NPO prior to surgery.  ? ? ? ? ? ? ? ? ? ? ? ? ? ? ?

## 2021-04-26 ENCOUNTER — Encounter
Admission: RE | Admit: 2021-04-26 | Discharge: 2021-04-26 | Disposition: A | Discharge status: Home / Self Care | Payer: Medicare (Managed Care) | Source: Ambulatory Visit | Attending: Urology | Admitting: Urology

## 2021-04-26 HISTORY — DX: Pneumonia, unspecified organism: J18.9

## 2021-04-26 HISTORY — DX: Obstructive sleep apnea (adult) (pediatric): G47.33

## 2021-04-26 HISTORY — DX: Unspecified glaucoma: H40.9

## 2021-04-26 HISTORY — DX: Hyperlipidemia, unspecified: E78.5

## 2021-04-26 HISTORY — DX: Calculus of kidney: N20.0

## 2021-04-26 HISTORY — DX: Prediabetes: R73.03

## 2021-04-26 NOTE — Patient Instructions (Signed)
Your procedure is scheduled on: 08/05/21 Report to DAY SURGERY DEPARTMENT LOCATED ON 2ND FLOOR MEDICAL MALL ENTRANCE. To find out your arrival time please call [phone] between 1PM - 3PM on 08/04/21.  Remember: Instructions that are not followed completely may result in serious medical risk, up to and including death, or upon the discretion of your surgeon and anesthesiologist your surgery may need to be rescheduled.     _X__ 1. Do not eat food of drink any liquids after midnight the night before your procedure.                 No gum chewing or hard candies.   __X__2.  On the morning of surgery brush your teeth with toothpaste and water, you                 may rinse your mouth with mouthwash if you wish.  Do not swallow any              toothpaste of mouthwash.     _X__ 3.  No Alcohol for 24 hours before or after surgery.   _X__ 4.  Do Not Smoke or use e-cigarettes For 24 Hours Prior to Your Surgery.                 Do not use any chewable tobacco products for at least 6 hours prior to                 surgery.  ____  5.  Bring all medications with you on the day of surgery if instructed.   __X__  6.  Notify your doctor if there is any change in your medical condition      (cold, fever, infections).     Do not wear jewelry, make-up, hairpins, clips or nail polish. Do not wear lotions, powders, or perfumes.  Do not shave body hair 48 hours prior to surgery. Men may shave face and neck. Do not bring valuables to the hospital.    [facility] is not responsible for any belongings or valuables.  Contacts, dentures/partials or body piercings may not be worn into surgery. Bring a case for your contacts, glasses or hearing aids, a denture cup will be supplied. Leave your suitcase in the car. After surgery it may be brought to your room. For patients admitted to the hospital, discharge time is determined by your treatment team.   Patients discharged the day of surgery will not be  allowed to drive home.   Please read over the following fact sheets that you were given: NA    __X__ Take these medicines the morning of surgery with A SIP OF WATER:    1. levETIRAcetam (KEPPRA) 750 MG tablet  2. levothyroxine (SYNTHROID) 100 MCG tablet  3. omeprazole (PRILOSEC) 20 MG capsule  4.  5.  6.  ____ Fleet Enema (as directed)   ____ Use CHG Soap/SAGE wipes as directed  ____ Use inhalers on the day of surgery  __X__ Stop JARDIANCE 2 days prior to surgery LAST DOSE Saturday 05/04/21   ____ Take 1/2 of usual insulin dose the night before surgery. No insulin the morning          of surgery.   ____ Stop Blood Thinners Coumadin/Plavix/Xarelto/Pleta/Pradaxa/Eliquis/Effient/Aspirin  on   Or contact your Surgeon, Cardiologist or Medical Doctor regarding  ability to stop your blood thinners  __X__ Stop Anti-inflammatories 7 days before surgery such as Advil, Ibuprofen, Motrin,  BC or Goodies Powder, Naprosyn,  Naproxen, Aleve   __X__ Stop all herbals and supplements, fish oil or vitamins for 7 days until after surgery.    ____ Bring C-Pap to the hospital.

## 2021-04-26 NOTE — Pre-Procedure Instructions (Signed)
Verbal consent for left ureteroscopy with laser lithotripsy and stent exchange given by patients legal guardian Jilda Roche. Greggory Stallion RN as second witness.

## 2021-05-09 ENCOUNTER — Telehealth: Payer: Self-pay

## 2021-05-09 MED ORDER — AMOXICILLIN 500 MG PO CAPS: 500.0000 mg | ORAL_CAPSULE | Freq: Two times a day (BID) | ORAL | 0 refills | Status: AC

## 2021-05-09 MED ORDER — SODIUM CHLORIDE 0.9 % IV SOLN
INTRAVENOUS | Status: DC
Start: 2021-05-09 — End: 2021-05-13

## 2021-05-09 MED ORDER — CEFAZOLIN SODIUM-DEXTROSE 2-4 GM/100ML-% IV SOLN: 2.0000 g | INTRAVENOUS | Status: DC

## 2021-05-09 NOTE — Telephone Encounter (Signed)
Spoke with patient's medical team through [facility] and [facility]. Advised that patient needed to start medication and surgery will be rescheduled to Friday. Team verbalized understanding. Medication sent to [facility] pharmacy per request.  ?

## 2021-05-12 ENCOUNTER — Inpatient Hospital Stay: Admission: RE | Admit: 2021-05-12 | Payer: Medicare (Managed Care) | Source: Ambulatory Visit

## 2021-05-12 MED ORDER — CHLORHEXIDINE GLUCONATE 0.12 % MT SOLN: 15.0000 mL | Freq: Once | OROMUCOSAL | Status: DC

## 2021-05-12 MED ORDER — ORAL CARE MOUTH RINSE: 15.0000 mL | Freq: Once | OROMUCOSAL | Status: DC

## 2021-05-13 ENCOUNTER — Encounter: Payer: Self-pay | Admitting: Registered Nurse

## 2021-05-13 ENCOUNTER — Ambulatory Visit: Admission: RE | Admit: 2021-05-13 | Payer: Medicare (Managed Care) | Source: Home / Self Care | Admitting: Urology

## 2021-05-13 ENCOUNTER — Encounter: Admission: RE | Payer: Self-pay | Source: Home / Self Care

## 2021-05-13 ENCOUNTER — Ambulatory Visit: Admit: 2021-05-13 | Payer: Medicare (Managed Care) | Admitting: Urology

## 2021-05-13 DIAGNOSIS — N201 Calculus of ureter: Secondary | ICD-10-CM

## 2021-05-13 SURGERY — CYSTOSCOPY/URETEROSCOPY/HOLMIUM LASER/STENT PLACEMENT
Anesthesia: General | Laterality: Left

## 2021-05-13 NOTE — Progress Notes (Signed)
This patient will need to be rescheduled due to no transportation scheduled for today. ?

## 2021-05-17 ENCOUNTER — Emergency Department: Payer: Medicare (Managed Care)

## 2021-05-17 ENCOUNTER — Other Ambulatory Visit: Payer: Self-pay

## 2021-05-17 ENCOUNTER — Inpatient Hospital Stay
Admission: EM | Admit: 2021-05-17 | Discharge: 2021-05-30 | DRG: 871 | Disposition: A | Discharge status: Skilled Nursing Facility | Payer: Medicare (Managed Care) | Source: Skilled Nursing Facility | Attending: Family Medicine | Admitting: Family Medicine

## 2021-05-17 DIAGNOSIS — T380X5A Adverse effect of glucocorticoids and synthetic analogues, initial encounter: Secondary | ICD-10-CM | POA: Diagnosis not present

## 2021-05-17 DIAGNOSIS — Q909 Down syndrome, unspecified: Secondary | ICD-10-CM | POA: Diagnosis not present

## 2021-05-17 DIAGNOSIS — E039 Hypothyroidism, unspecified: Secondary | ICD-10-CM | POA: Diagnosis present

## 2021-05-17 DIAGNOSIS — J189 Pneumonia, unspecified organism: Secondary | ICD-10-CM | POA: Diagnosis present

## 2021-05-17 DIAGNOSIS — R6520 Severe sepsis without septic shock: Secondary | ICD-10-CM

## 2021-05-17 DIAGNOSIS — Y92239 Unspecified place in hospital as the place of occurrence of the external cause: Secondary | ICD-10-CM | POA: Diagnosis not present

## 2021-05-17 DIAGNOSIS — D696 Thrombocytopenia, unspecified: Secondary | ICD-10-CM | POA: Diagnosis present

## 2021-05-17 DIAGNOSIS — F0280 Dementia in other diseases classified elsewhere without behavioral disturbance: Secondary | ICD-10-CM | POA: Diagnosis present

## 2021-05-17 DIAGNOSIS — E870 Hyperosmolality and hypernatremia: Secondary | ICD-10-CM | POA: Diagnosis not present

## 2021-05-17 DIAGNOSIS — Z20822 Contact with and (suspected) exposure to covid-19: Secondary | ICD-10-CM | POA: Diagnosis present

## 2021-05-17 DIAGNOSIS — N390 Urinary tract infection, site not specified: Secondary | ICD-10-CM

## 2021-05-17 DIAGNOSIS — I959 Hypotension, unspecified: Secondary | ICD-10-CM | POA: Diagnosis not present

## 2021-05-17 DIAGNOSIS — Z79890 Hormone replacement therapy (postmenopausal): Secondary | ICD-10-CM | POA: Diagnosis not present

## 2021-05-17 DIAGNOSIS — N179 Acute kidney failure, unspecified: Secondary | ICD-10-CM | POA: Diagnosis present

## 2021-05-17 DIAGNOSIS — J9690 Respiratory failure, unspecified, unspecified whether with hypoxia or hypercapnia: Secondary | ICD-10-CM | POA: Diagnosis present

## 2021-05-17 DIAGNOSIS — G9341 Metabolic encephalopathy: Secondary | ICD-10-CM | POA: Diagnosis present

## 2021-05-17 DIAGNOSIS — A419 Sepsis, unspecified organism: Principal | ICD-10-CM

## 2021-05-17 DIAGNOSIS — Z6832 Body mass index (BMI) 32.0-32.9, adult: Secondary | ICD-10-CM

## 2021-05-17 DIAGNOSIS — F0150 Vascular dementia without behavioral disturbance: Secondary | ICD-10-CM | POA: Diagnosis present

## 2021-05-17 DIAGNOSIS — E785 Hyperlipidemia, unspecified: Secondary | ICD-10-CM | POA: Diagnosis present

## 2021-05-17 DIAGNOSIS — E66813 Obesity, class 3: Secondary | ICD-10-CM | POA: Diagnosis present

## 2021-05-17 DIAGNOSIS — R739 Hyperglycemia, unspecified: Secondary | ICD-10-CM | POA: Diagnosis not present

## 2021-05-17 DIAGNOSIS — G4733 Obstructive sleep apnea (adult) (pediatric): Secondary | ICD-10-CM | POA: Diagnosis present

## 2021-05-17 DIAGNOSIS — N136 Pyonephrosis: Secondary | ICD-10-CM | POA: Diagnosis present

## 2021-05-17 DIAGNOSIS — I110 Hypertensive heart disease with heart failure: Secondary | ICD-10-CM | POA: Diagnosis present

## 2021-05-17 DIAGNOSIS — G309 Alzheimer's disease, unspecified: Secondary | ICD-10-CM | POA: Diagnosis present

## 2021-05-17 DIAGNOSIS — I5032 Chronic diastolic (congestive) heart failure: Secondary | ICD-10-CM | POA: Diagnosis present

## 2021-05-17 DIAGNOSIS — J9601 Acute respiratory failure with hypoxia: Secondary | ICD-10-CM | POA: Diagnosis present

## 2021-05-17 DIAGNOSIS — Z87442 Personal history of urinary calculi: Secondary | ICD-10-CM

## 2021-05-17 DIAGNOSIS — F0390 Unspecified dementia without behavioral disturbance: Secondary | ICD-10-CM | POA: Diagnosis not present

## 2021-05-17 DIAGNOSIS — K219 Gastro-esophageal reflux disease without esophagitis: Secondary | ICD-10-CM | POA: Diagnosis present

## 2021-05-17 DIAGNOSIS — R7303 Prediabetes: Secondary | ICD-10-CM | POA: Diagnosis present

## 2021-05-17 HISTORY — DX: Pneumonia, unspecified organism: J18.9

## 2021-05-17 HISTORY — DX: Urinary tract infection, site not specified: N39.0

## 2021-05-17 HISTORY — DX: Sepsis, unspecified organism: A41.9

## 2021-05-17 LAB — URINALYSIS, COMPLETE (UACMP) WITH MICROSCOPIC
Bilirubin Urine: NEGATIVE
Glucose, UA: >=500 mg/dL — AB
Ketones, ur: NEGATIVE mg/dL
Nitrite: NEGATIVE
Protein, ur: 100 mg/dL — AB
RBC / HPF: >50 RBC/hpf — ABNORMAL HIGH (ref 0–5)
Specific Gravity, Urine: 1.021 (ref 1.005–1.030)
WBC, UA: >50 WBC/hpf — ABNORMAL HIGH (ref 0–5)
pH: 5 (ref 5.0–8.0)

## 2021-05-17 LAB — TROPONIN I (HIGH SENSITIVITY)
Troponin I (High Sensitivity): 164 ng/L (ref <18)
Troponin I (High Sensitivity): 155 ng/L (ref <18)

## 2021-05-17 LAB — LACTIC ACID, PLASMA
Lactic Acid, Venous: 2.4 mmol/L (ref 0.5–1.9)
Lactic Acid, Venous: 1.6 mmol/L (ref 0.5–1.9)

## 2021-05-17 LAB — CBC WITH DIFFERENTIAL/PLATELET
Abs Immature Granulocytes: 0.17 10*3/uL — ABNORMAL HIGH (ref 0.00–0.07)
Basophils Absolute: 0.1 10*3/uL (ref 0.0–0.1)
Basophils Relative: 1 %
Eosinophils Absolute: 0 10*3/uL (ref 0.0–0.5)
Eosinophils Relative: 0 %
HCT: 42.5 % (ref 36.0–46.0)
Hemoglobin: 12.5 g/dL (ref 12.0–15.0)
Immature Granulocytes: 2 %
Lymphocytes Relative: 12 %
Lymphs Abs: 1.3 10*3/uL (ref 0.7–4.0)
MCH: 29.5 pg (ref 26.0–34.0)
MCHC: 29.4 g/dL — ABNORMAL LOW (ref 30.0–36.0)
MCV: 100.2 fL — ABNORMAL HIGH (ref 80.0–100.0)
Monocytes Absolute: 0.5 10*3/uL (ref 0.1–1.0)
Monocytes Relative: 5 %
Neutro Abs: 8.5 10*3/uL — ABNORMAL HIGH (ref 1.7–7.7)
Neutrophils Relative %: 80 %
Platelets: 221 10*3/uL (ref 150–400)
RBC: 4.24 MIL/uL (ref 3.87–5.11)
RDW: 16.7 % — ABNORMAL HIGH (ref 11.5–15.5)
WBC: 10.6 10*3/uL — ABNORMAL HIGH (ref 4.0–10.5)
nRBC: 0 % (ref 0.0–0.2)

## 2021-05-17 LAB — COMPREHENSIVE METABOLIC PANEL
ALT: 9 U/L (ref 0–44)
AST: 18 U/L (ref 15–41)
Albumin: 3.1 g/dL — ABNORMAL LOW (ref 3.5–5.0)
Alkaline Phosphatase: 46 U/L (ref 38–126)
Anion gap: 12 (ref 5–15)
BUN: 14 mg/dL (ref 8–23)
CO2: 27 mmol/L (ref 22–32)
Calcium: 8.6 mg/dL — ABNORMAL LOW (ref 8.9–10.3)
Chloride: 102 mmol/L (ref 98–111)
Creatinine, Ser: 1.45 mg/dL — ABNORMAL HIGH (ref 0.44–1.00)
GFR, Estimated: 40 mL/min — ABNORMAL LOW (ref >60)
Glucose, Bld: 163 mg/dL — ABNORMAL HIGH (ref 70–99)
Potassium: 3.8 mmol/L (ref 3.5–5.1)
Sodium: 141 mmol/L (ref 135–145)
Total Bilirubin: 0.6 mg/dL (ref 0.3–1.2)
Total Protein: 7.7 g/dL (ref 6.5–8.1)

## 2021-05-17 LAB — HEMOGLOBIN A1C
Hgb A1c MFr Bld: 5.9 % — ABNORMAL HIGH (ref 4.8–5.6)
Mean Plasma Glucose: 122.63 mg/dL

## 2021-05-17 LAB — RESP PANEL BY RT-PCR (FLU A&B, COVID) ARPGX2
Influenza A by PCR: NEGATIVE
Influenza B by PCR: NEGATIVE
SARS Coronavirus 2 by RT PCR: NEGATIVE

## 2021-05-17 LAB — STREP PNEUMONIAE URINARY ANTIGEN: Strep Pneumo Urinary Antigen: NEGATIVE

## 2021-05-17 LAB — BLOOD GAS, VENOUS
Patient temperature: 37
pCO2, Ven: 61 mmHg — ABNORMAL HIGH (ref 44–60)
pH, Ven: 7.32 (ref 7.25–7.43)

## 2021-05-17 LAB — PROTIME-INR
INR: 1.1 (ref 0.8–1.2)
Prothrombin Time: 14 seconds (ref 11.4–15.2)

## 2021-05-17 LAB — APTT: aPTT: 30 seconds (ref 24–36)

## 2021-05-17 LAB — TSH: TSH: 1.069 u[IU]/mL (ref 0.350–4.500)

## 2021-05-17 MED ORDER — LEVOTHYROXINE SODIUM 100 MCG PO TABS
100.0000 ug | ORAL_TABLET | Freq: Every day | ORAL | Status: DC
  Administered 2021-05-19 – 2021-05-30 (×10): 100 ug via ORAL
  Filled 2021-05-17 (×2): qty 1
  Filled 2021-05-17: qty 2
  Filled 2021-05-17 (×9): qty 1

## 2021-05-17 MED ORDER — ACETAMINOPHEN 325 MG PO TABS: 650.0000 mg | ORAL_TABLET | Freq: Once | ORAL | Status: DC

## 2021-05-17 MED ORDER — ONDANSETRON HCL 4 MG/2ML IJ SOLN
4.0000 mg | Freq: Four times a day (QID) | INTRAMUSCULAR | Status: DC | PRN
Start: 2021-05-17 — End: 2021-05-30

## 2021-05-17 MED ORDER — ACETAMINOPHEN 325 MG PO TABS
650.0000 mg | ORAL_TABLET | Freq: Four times a day (QID) | ORAL | Status: DC | PRN
  Administered 2021-05-22: 650 mg via ORAL
  Filled 2021-05-17: qty 2

## 2021-05-17 MED ORDER — IPRATROPIUM-ALBUTEROL 0.5-2.5 (3) MG/3ML IN SOLN
3.0000 mL | Freq: Once | RESPIRATORY_TRACT | Status: AC
Start: 2021-05-17 — End: 2021-05-17
  Administered 2021-05-17: 3 mL via RESPIRATORY_TRACT
  Filled 2021-05-17: qty 3

## 2021-05-17 MED ORDER — LACTATED RINGERS IV SOLN: INTRAVENOUS | Status: DC

## 2021-05-17 MED ORDER — ASPIRIN EC 81 MG PO TBEC
81.0000 mg | DELAYED_RELEASE_TABLET | Freq: Every day | ORAL | Status: DC
  Administered 2021-05-18 – 2021-05-30 (×12): 81 mg via ORAL
  Filled 2021-05-17 (×12): qty 1

## 2021-05-17 MED ORDER — LEVETIRACETAM 250 MG PO TABS
250.0000 mg | ORAL_TABLET | Freq: Two times a day (BID) | ORAL | Status: DC
Start: 2021-05-17 — End: 2021-05-20
  Administered 2021-05-18 – 2021-05-19 (×3): 250 mg via ORAL
  Filled 2021-05-17 (×6): qty 1

## 2021-05-17 MED ORDER — METHYLPREDNISOLONE SODIUM SUCC 125 MG IJ SOLR
125.0000 mg | Freq: Once | INTRAMUSCULAR | Status: AC
  Administered 2021-05-17: 125 mg via INTRAVENOUS
  Filled 2021-05-17: qty 2

## 2021-05-17 MED ORDER — SODIUM CHLORIDE 0.9 % IV SOLN: INTRAVENOUS | Status: AC

## 2021-05-17 MED ORDER — VANCOMYCIN HCL IN DEXTROSE 1-5 GM/200ML-% IV SOLN
1000.0000 mg | Freq: Once | INTRAVENOUS | Status: AC
  Administered 2021-05-17: 1000 mg via INTRAVENOUS
  Filled 2021-05-17: qty 200

## 2021-05-17 MED ORDER — ACETAMINOPHEN 650 MG RE SUPP
650.0000 mg | Freq: Four times a day (QID) | RECTAL | Status: DC | PRN
  Administered 2021-05-20: 650 mg via RECTAL
  Filled 2021-05-17: qty 1

## 2021-05-17 MED ORDER — HEPARIN SODIUM (PORCINE) 5000 UNIT/ML IJ SOLN
5000.0000 [IU] | Freq: Three times a day (TID) | INTRAMUSCULAR | Status: DC
  Administered 2021-05-17 – 2021-05-30 (×39): 5000 [IU] via SUBCUTANEOUS
  Filled 2021-05-17 (×38): qty 1

## 2021-05-17 MED ORDER — PRAVASTATIN SODIUM 40 MG PO TABS
40.0000 mg | ORAL_TABLET | Freq: Every day | ORAL | Status: DC
  Administered 2021-05-18 – 2021-05-29 (×11): 40 mg via ORAL
  Filled 2021-05-17 (×11): qty 1

## 2021-05-17 MED ORDER — SENNA 8.6 MG PO TABS
2.0000 | ORAL_TABLET | Freq: Every day | ORAL | Status: DC
  Administered 2021-05-18 – 2021-05-27 (×9): 17.2 mg via ORAL
  Filled 2021-05-17 (×10): qty 2

## 2021-05-17 MED ORDER — SODIUM CHLORIDE 0.9 % IV BOLUS (SEPSIS)
1000.0000 mL | Freq: Once | INTRAVENOUS | Status: AC
  Administered 2021-05-17: 1000 mL via INTRAVENOUS

## 2021-05-17 MED ORDER — MOMETASONE FURO-FORMOTEROL FUM 100-5 MCG/ACT IN AERO
2.0000 | INHALATION_SPRAY | Freq: Two times a day (BID) | RESPIRATORY_TRACT | Status: DC
  Administered 2021-05-18 – 2021-05-30 (×23): 2 via RESPIRATORY_TRACT
  Filled 2021-05-17: qty 8.8

## 2021-05-17 MED ORDER — SODIUM CHLORIDE 0.9 % IV SOLN: 2.0000 g | INTRAVENOUS | Status: DC

## 2021-05-17 MED ORDER — ONDANSETRON HCL 4 MG PO TABS: 4.0000 mg | ORAL_TABLET | Freq: Four times a day (QID) | ORAL | Status: DC | PRN

## 2021-05-17 MED ORDER — SODIUM CHLORIDE 0.9 % IV SOLN: 500.0000 mg | INTRAVENOUS | Status: DC

## 2021-05-17 MED ORDER — SODIUM CHLORIDE 0.9 % IV SOLN
2.0000 g | Freq: Once | INTRAVENOUS | Status: AC
  Administered 2021-05-17: 2 g via INTRAVENOUS
  Filled 2021-05-17: qty 12.5

## 2021-05-17 MED ORDER — VITAMIN D 25 MCG (1000 UNIT) PO TABS
2000.0000 [IU] | ORAL_TABLET | Freq: Every day | ORAL | Status: DC
  Administered 2021-05-18 – 2021-05-30 (×12): 2000 [IU] via ORAL
  Filled 2021-05-17 (×12): qty 2

## 2021-05-17 MED ORDER — VANCOMYCIN HCL 1250 MG/250ML IV SOLN: 1250.0000 mg | INTRAVENOUS | Status: DC

## 2021-05-17 MED ORDER — SODIUM CHLORIDE 0.9% FLUSH
3.0000 mL | Freq: Two times a day (BID) | INTRAVENOUS | Status: DC
  Administered 2021-05-17 – 2021-05-30 (×24): 3 mL via INTRAVENOUS

## 2021-05-17 MED ORDER — SODIUM CHLORIDE 0.9 % IV SOLN
2.0000 g | Freq: Two times a day (BID) | INTRAVENOUS | Status: DC
  Administered 2021-05-17: 2 g via INTRAVENOUS

## 2021-05-17 MED ORDER — LATANOPROST 0.005 % OP SOLN
1.0000 [drp] | Freq: Every day | OPHTHALMIC | Status: DC
  Administered 2021-05-18 – 2021-05-29 (×12): 1 [drp] via OPHTHALMIC
  Filled 2021-05-17: qty 2.5

## 2021-05-17 MED ORDER — LEVETIRACETAM 500 MG PO TABS
500.0000 mg | ORAL_TABLET | Freq: Two times a day (BID) | ORAL | Status: DC
  Administered 2021-05-18 – 2021-05-19 (×3): 500 mg via ORAL
  Filled 2021-05-17 (×3): qty 1

## 2021-05-17 MED ORDER — SODIUM CHLORIDE 0.9 % IV BOLUS
1000.0000 mL | Freq: Once | INTRAVENOUS | Status: AC
  Administered 2021-05-17: 1000 mL via INTRAVENOUS

## 2021-05-17 MED ORDER — IOHEXOL 350 MG/ML SOLN
60.0000 mL | Freq: Once | INTRAVENOUS | Status: AC | PRN
  Administered 2021-05-17: 60 mL via INTRAVENOUS

## 2021-05-17 MED ORDER — ACETAMINOPHEN 325 MG PO TABS: 650.0000 mg | ORAL_TABLET | Freq: Four times a day (QID) | ORAL | Status: DC | PRN

## 2021-05-17 MED ORDER — ACETAMINOPHEN 325 MG RE SUPP
650.0000 mg | Freq: Once | RECTAL | Status: AC
  Administered 2021-05-17: 650 mg via RECTAL
  Filled 2021-05-17: qty 2

## 2021-05-17 MED ORDER — IPRATROPIUM-ALBUTEROL 0.5-2.5 (3) MG/3ML IN SOLN
3.0000 mL | RESPIRATORY_TRACT | Status: DC | PRN
  Administered 2021-05-17 – 2021-05-19 (×3): 3 mL via RESPIRATORY_TRACT
  Filled 2021-05-17 (×2): qty 3

## 2021-05-17 MED ORDER — IPRATROPIUM-ALBUTEROL 0.5-2.5 (3) MG/3ML IN SOLN
3.0000 mL | Freq: Once | RESPIRATORY_TRACT | Status: AC
  Administered 2021-05-17: 3 mL via RESPIRATORY_TRACT
  Filled 2021-05-17: qty 3

## 2021-05-17 MED ORDER — PANTOPRAZOLE SODIUM 40 MG PO TBEC
40.0000 mg | DELAYED_RELEASE_TABLET | Freq: Every day | ORAL | Status: DC
Start: 2021-05-18 — End: 2021-05-30
  Administered 2021-05-18 – 2021-05-30 (×12): 40 mg via ORAL
  Filled 2021-05-17 (×13): qty 1

## 2021-05-17 MED ORDER — VITAMIN B-12 1000 MCG PO TABS
1000.0000 ug | ORAL_TABLET | Freq: Every day | ORAL | Status: DC
  Administered 2021-05-18 – 2021-05-30 (×12): 1000 ug via ORAL
  Filled 2021-05-17 (×13): qty 1

## 2021-05-17 MED ORDER — VANCOMYCIN HCL 1250 MG/250ML IV SOLN
1250.0000 mg | Freq: Once | INTRAVENOUS | Status: AC
  Administered 2021-05-17: 1250 mg via INTRAVENOUS
  Filled 2021-05-17: qty 250

## 2021-05-17 NOTE — ED Provider Notes (Signed)
? ?[facility] ?Provider Note ? ? ? Event Date/Time  ? First MD Initiated Contact with Patient 05/17/21 1100   ?  (approximate) ? ? ?History  ? ?Shortness of Breath ? ? ?HPI ? ?Lori Petersen is a 66 y.o. female   with past medical history of Down syndrome, heart failure, hypertension, hyperlipidemia, here with cough and reported shortness of breath.  History limited due to history of Down syndrome and dementia.  Per report, the patient has had a cough for the last 1 to 2 days per the facility.  She is a more short of breath today and was hypoxic.  EMS arrived and found her with obvious respiratory distress and hypoxia.  She arrives on nonrebreather.  She was given a DuoNeb in route.  No known history of COPD.  On my assessment, patient largely unable to provide much history.  She shakes her head no to any kind of chest pain.  No other complaints.  Remainder of history limited due to mental status. ? ?  ? ? ?Physical Exam  ? ?Triage Vital Signs: ?ED Triage Vitals  ?Enc Vitals Group  ?   BP 05/17/21 1102 108/89  ?   Pulse Rate 05/17/21 1102 (!) 103  ?   Resp 05/17/21 1105 (!) 28  ?   Temp 05/17/21 1107 100.2 ?F (37.9 ?C)  ?   Temp Source 05/17/21 1107 Oral  ?   SpO2 05/17/21 1102 100 %  ?   Weight 05/17/21 1102 218 lb 12.8 oz (99.2 kg)  ?   Height 05/17/21 1102 5\' 8"  (1.727 m)  ?   Head Circumference --   ?   Peak Flow --   ?   Pain Score 05/17/21 1100 0  ?   Pain Loc --   ?   Pain Edu? --   ?   Excl. in GC? --   ? ? ?Most recent vital signs: ?Vitals:  ? 05/17/21 1830 05/17/21 1925  ?BP: 91/76 105/74  ?Pulse: 72 74  ?Resp:  (!) 26  ?Temp:  99.3 ?F (37.4 ?C)  ?SpO2: 99% 97%  ? ? ? ?General: Awake, mild respiratory distress with tachypnea.  Sitting upright.  Nonrebreather in place. ?CV:  Good peripheral perfusion.  Tachycardic, no murmurs. ?Resp:  Increased work of breathing with tachypnea and retractions.  Diffuse wheezing.  Diminished aeration. ?Abd:  No distention.  No overt  tenderness. ?Other:  No significant lower extremity edema. ? ? ?ED Results / Procedures / Treatments  ? ?Labs ?(all labs ordered are listed, but only abnormal results are displayed) ?Labs Reviewed  ?LACTIC ACID, PLASMA - Abnormal; Notable for the following components:  ?    Result Value  ? Lactic Acid, Venous 2.4 (*)   ? All other components within normal limits  ?COMPREHENSIVE METABOLIC PANEL - Abnormal; Notable for the following components:  ? Glucose, Bld 163 (*)   ? Creatinine, Ser 1.45 (*)   ? Calcium 8.6 (*)   ? Albumin 3.1 (*)   ? GFR, Estimated 40 (*)   ? All other components within normal limits  ?CBC WITH DIFFERENTIAL/PLATELET - Abnormal; Notable for the following components:  ? WBC 10.6 (*)   ? MCV 100.2 (*)   ? MCHC 29.4 (*)   ? RDW 16.7 (*)   ? Neutro Abs 8.5 (*)   ? Abs Immature Granulocytes 0.17 (*)   ? All other components within normal limits  ?URINALYSIS, COMPLETE (UACMP) WITH MICROSCOPIC - Abnormal; Notable  for the following components:  ? Color, Urine YELLOW (*)   ? APPearance TURBID (*)   ? Glucose, UA >=500 (*)   ? Hgb urine dipstick LARGE (*)   ? Protein, ur 100 (*)   ? Leukocytes,Ua LARGE (*)   ? RBC / HPF >50 (*)   ? WBC, UA >50 (*)   ? Bacteria, UA FEW (*)   ? All other components within normal limits  ?BLOOD GAS, VENOUS - Abnormal; Notable for the following components:  ? pCO2, Ven 61 (*)   ? All other components within normal limits  ?BLOOD GAS, ARTERIAL - Abnormal; Notable for the following components:  ? pO2, Arterial 122 (*)   ? All other components within normal limits  ?TROPONIN I (HIGH SENSITIVITY) - Abnormal; Notable for the following components:  ? Troponin I (High Sensitivity) 164 (*)   ? All other components within normal limits  ?TROPONIN I (HIGH SENSITIVITY) - Abnormal; Notable for the following components:  ? Troponin I (High Sensitivity) 155 (*)   ? All other components within normal limits  ?RESP PANEL BY RT-PCR (FLU A&B, COVID) [id number]  ?CULTURE, BLOOD (ROUTINE X 2)   ?CULTURE, BLOOD (ROUTINE X 2)  ?URINE CULTURE  ?SURGICAL PCR SCREEN  ?LACTIC ACID, PLASMA  ?PROTIME-INR  ?APTT  ?TSH  ?LEGIONELLA PNEUMOPHILA SEROGP 1 UR AG  ?STREP PNEUMONIAE URINARY ANTIGEN  ?HEMOGLOBIN A1C  ?COMPREHENSIVE METABOLIC PANEL  ?CBC  ?MAGNESIUM  ? ? ? ?EKG ?Sinus tachycardia, ventricular rate 125.  PR 149, QRS 126, QTc 427.  No acute ST elevations or depressions.  Nonspecific ST changes. ? ? ?RADIOLOGY ?Chest x-ray: No focal abnormality ? ? ?I also independently reviewed and agree with radiologist interpretations. ? ? ?PROCEDURES: ? ?Critical Care performed: Yes, see critical care procedure note(s) ? ?.Critical Care ?Performed by: Shaune PollackIsaacs, Anchor Dwan, MD ?Authorized by: Shaune PollackIsaacs, Amato Sevillano, MD  ? ?Critical care provider statement:  ?  Critical care time (minutes):  30 ?  Critical care time was exclusive of:  Separately billable procedures and treating other patients ?  Critical care was necessary to treat or prevent imminent or life-threatening deterioration of the following conditions:  Cardiac failure, circulatory failure and respiratory failure ?  Critical care was time spent personally by me on the following activities:  Development of treatment plan with patient or surrogate, discussions with consultants, evaluation of patient's response to treatment, examination of patient, ordering and review of laboratory studies, ordering and review of radiographic studies, ordering and performing treatments and interventions, pulse oximetry, re-evaluation of patient's condition and review of old charts ?  I assumed direction of critical care for this patient from another provider in my specialty: no   ?  Care discussed with: admitting provider   ? ? ? ?MEDICATIONS ORDERED IN ED: ?Medications  ?aspirin EC tablet 81 mg (has no administration in time range)  ?pravastatin (PRAVACHOL) tablet 40 mg (has no administration in time range)  ?levothyroxine (SYNTHROID) tablet 100 mcg (has no administration in time range)   ?pantoprazole (PROTONIX) EC tablet 40 mg (has no administration in time range)  ?senna (SENOKOT) tablet 17.2 mg (has no administration in time range)  ?vitamin B-12 (CYANOCOBALAMIN) tablet 1,000 mcg (has no administration in time range)  ?levETIRAcetam (KEPPRA) tablet 250 mg (has no administration in time range)  ?levETIRAcetam (KEPPRA) tablet 500 mg (has no administration in time range)  ?cholecalciferol (VITAMIN D3) tablet 2,000 Units (has no administration in time range)  ?mometasone-formoterol (DULERA) 100-5 MCG/ACT inhaler 2 puff (2 puffs  Inhalation Not Given 05/17/21 1900)  ?ipratropium-albuterol (DUONEB) 0.5-2.5 (3) MG/3ML nebulizer solution 3 mL (has no administration in time range)  ?latanoprost (XALATAN) 0.005 % ophthalmic solution 1 drop (has no administration in time range)  ?heparin injection 5,000 Units (5,000 Units Subcutaneous Given 05/17/21 1724)  ?sodium chloride flush (NS) 0.9 % injection 3 mL (has no administration in time range)  ?acetaminophen (TYLENOL) tablet 650 mg (has no administration in time range)  ?  Or  ?acetaminophen (TYLENOL) suppository 650 mg (has no administration in time range)  ?ondansetron (ZOFRAN) tablet 4 mg (has no administration in time range)  ?  Or  ?ondansetron (ZOFRAN) injection 4 mg (has no administration in time range)  ?ceFEPIme (MAXIPIME) 2 g in sodium chloride 0.9 % 100 mL IVPB (has no administration in time range)  ?vancomycin (VANCOREADY) IVPB 1250 mg/250 mL (has no administration in time range)  ?0.9 %  sodium chloride infusion ( Intravenous New Bag/Given 05/17/21 1856)  ?sodium chloride 0.9 % bolus 1,000 mL (0 mLs Intravenous Stopped 05/17/21 1417)  ?ceFEPIme (MAXIPIME) 2 g in sodium chloride 0.9 % 100 mL IVPB (0 g Intravenous Stopped 05/17/21 1226)  ?vancomycin (VANCOCIN) IVPB 1000 mg/200 mL premix (0 mg Intravenous Stopped 05/17/21 1300)  ?sodium chloride 0.9 % bolus 1,000 mL (0 mLs Intravenous Stopped 05/17/21 1606)  ?acetaminophen (TYLENOL) suppository 650 mg (650 mg  Rectal Given 05/17/21 1202)  ?ipratropium-albuterol (DUONEB) 0.5-2.5 (3) MG/3ML nebulizer solution 3 mL (3 mLs Nebulization Given 05/17/21 1310)  ?methylPREDNISolone sodium succinate (SOLU-MEDROL) 125 mg/2 mL injection 125 mg (125 mg Intra

## 2021-05-17 NOTE — Hospital Course (Addendum)
Patient is a 66 years old female with past medical history of Down syndrome, heart failure, hypertension, dementia, hyperlipidemia presented to hospital with complaints of cough and shortness of breath.  Of note, patient was recently seen by her primary care physician as outpatient.  Initially patient had presented with some stuffy runny nose subsequently she had increasing shortness of breath.  She was noted to be hypoxic and EMS was called in.  Patient required nonrebreather mask in route to the hospital.  History is extremely limited due to patient's underlying dementia and Down syndrome and family not available.  ED provider had spoken with the PCP .  [facility]   No prior history of COPD. ? ?In the ED, patient was tachycardic, tachypneic with a temperature of 100.2 ?F, lactate was elevated at 2.4 and had leukocytosis at 13.6.  Creatinine mildly elevated at 1.4.  She was in respiratory distress and was on nonrebreather mask initially but due to  had increased work of breathing with diffuse wheezing, she was subsequently placed on BiPAP.  EKG showed sinus tachycardia.  Chest-ray however did not show any focal abnormality.  Urinalysis showed turbid urine with significant glucose and protein and large leukocytes and WBC more than 50. Blood cultures were sent from the ED including urinalysis.  Code sepsis was activated in the ED.  COVID and influenza was negative.  Patient received IV fluid bolus followed by vancomycin and cefepime, IV Solu-Medrol 125 mg x 1. Patient was then considered for admission to the hospital for possible sepsis secondary to pneumonia with hypoxic respiratory failure. ? ?Assessment and plan. ? ?Principal Problem: ?  Acute respiratory failure with hypoxia (HCC) ?Active Problems: ?  Chronic diastolic CHF (congestive heart failure) (HCC) ?  Hypothyroid ?  Mixed Alzheimer's and vascular dementia (HCC) ?  Obesity, Class III, BMI 40-49.9 (morbid obesity) (HCC) ?   Pneumonia ?  Dementia (HCC) ?  HLD (hyperlipidemia) ?  Sepsis (HCC) ?  ?Acute hypoxic respiratory failure on presentation.   ?Patient presented with significant hypoxia on nonrebreather mask followed by BiPAP for increased work of breathing hypoxia and respiratory distress with multifocal pneumonia.  Continue with broad-spectrum antibiotic for typical pneumonia.  Will obtain speech and swallow evaluation.  Follow blood cultures.  Continue DuoNebs, inhalers.  Chest x-ray was negative for infiltrates.  CT scan of the chest with multifocal pneumonia in the right lung.  Currently on BiPAP.  Will obtain ABG for follow-up.  Wean BiPAP if possible. ? ?Possible mild metabolic encephalopathy likely secondary to pneumonia.  Has underlying dementia and Down syndrome.  We will continue to monitor. ? ?Sepsis  secondary to multifocal pneumonia/UTI. ?Patient presented with features of sepsis including tachycardia and tachypnea fever leukocytosis with elevated lactate with features suggestive of pneumonia with respiratory distress and failure.  Initial x-ray of the chest was negative.  CT scan of the chest showed patchy pulmonary opacities in the right upper right lower and middle lobes compatible with pneumonia.  Continue bronchodilators.  Consult RT for further management.  Follow urine cultures, blood cultures.  We will follow Legionella and strep urinary antigen. ? ?UTI.  Grossly abnormal urinalysis.  History of left ureteral stent in the past with distal left ureteral stone with persistent mild left hydroureteronephrosis.  We will follow urine cultures.  Continue with broad-spectrum antibiotic.  Follow urine cultures. ? ?Down syndrome/dementia.  Poor historian.  Possibility of aspiration.  We will get a speech and swallow evaluation.  Patient  is on Keppra as outpatient will resume. ? ?History of congestive heart failure.  On Jardiance.  Will hold for now.  Reviewed 2D echocardiogram from 03/25/2021 showed LV ejection fraction  of 55 to 60% with no regional wall motion abnormality. ? ?History of hypertension.  Not on antihypertensives at home.  We will continue to monitor while in the hospital. ? ?Hyperlipidemia.  On pravastatin.  Will resume when oral able. ? ?Hypothyroidism.  Patient is on Synthroid at home, will resume as able. ? ?

## 2021-05-17 NOTE — Consult Note (Signed)
PHARMACY -  BRIEF ANTIBIOTIC NOTE  ? ?Pharmacy has received consult(s) for vancomycin & cefepime from an ED provider.  The patient's profile has been reviewed for ht/wt/allergies/indication/available labs.   ? ?One time order(s) placed for:  ?Vancomycin 1g IV x1 ?Cefepime 2g IV x1 ? ?Further antibiotics/pharmacy consults should be ordered by admitting physician if indicated.       ?                ?Thank you, ?Shanon Brow Makina Skow ?05/17/2021  11:54 AM ? ?

## 2021-05-17 NOTE — Consult Note (Signed)
Pharmacy Antibiotic Note ? ?Lori Petersen [facility] is a 66 y.o. female w/ h/o down syndrome, heart failure, HTN, dementia, HLD presented to hospital with complaints of cough and SOB admitted on 05/17/2021 with sepsis (c/f urinary or respiratory source.  Pharmacy has been consulted for Vancomycin & Cefepime dosing. ? ?5/02 - CT scan positive for multifocal pneumonia in the rt lung. ?5/02 - UA abnormal (turbid, WBC >50, few bacteria)  ISO left ureteral stent and persistent mild left hydronephrosis. ? ?Plan: ? Pt Scr 1.45 currently (BL closer to 0.9-1), F/u MRSA PCR ordered & cultures. ? ?Cefepime 2g IV x1 in ED (5/02 1200); then cefepime 2g IV q12h ? ?Vancomycin 1g IV x1 in ED (5/02 1200) will complete loading dose with 1250mg  x1 (total load of ~22.7mg /kg); followed by vancomycin 1.25g q24h ?Goal AUC 400-550  ?Est AUC: 475; Cmax: 29.8; Cmin: 13 ?SCr 1.45; IBW; Vd 0.72 ? ? ?Height: 5\' 8"  (172.7 cm) ?Weight: 99.2 kg (218 lb 12.8 oz) ?IBW/kg (Calculated) : 63.9 ? ?Temp (24hrs), Avg:100.2 ?F (37.9 ?C), Min:100.2 ?F (37.9 ?C), Max:100.2 ?F (37.9 ?C) ? ?Recent Labs  ?Lab 05/17/21 ?1110 05/17/21 ?1308  ?WBC 10.6*  --   ?CREATININE 1.45*  --   ?LATICACIDVEN 2.4* 1.6  ?  ?Estimated Creatinine Clearance: 47.6 mL/min (A) (by C-G formula based on SCr of 1.45 mg/dL (H)).   ? ?No Known Allergies ? ?Antimicrobials this admission: ?Vanc/CFP ( 5/02 >>  ? ? ?Dose adjustments this admission: ?CTM and adjust as needed. Pt Scr 1.45 currently (BL closer to 0.9-1) ? ?Microbiology results: ?5/02 BCx: pending ?5/02 UCx: pending  ?5/02 Flu/Cov & Strep Ur Ag & Legionella Ur Ag: negative  ?5/02 MRSA PCR: ordered ? ?Thank you for allowing pharmacy to be a part of this patient?s care. ? ?Shanon Brow Brolin Dambrosia ?05/17/2021 4:50 PM ? ?

## 2021-05-17 NOTE — Progress Notes (Signed)
SLP Cancellation Note ? ?Patient Details ?Name: Lori Petersen [facility] ?MRN: [id number] ?DOB: [birth year] ? ? ?Cancelled treatment:       Reason Eval/Treat Not Completed:  (chart reviewed) ?Per chart review, pt has been admitted for possible sepsis secondary to pneumonia with hypoxic respiratory failure. Noted Chest CT results. Pt was recently seen by this service in 03/2021 w/ oral phase dysphagia noted in setting of Cognitive decline/Dementia. Pt required a modified diet then w/ Supervision.  ?Recommend f/u w/ MBSS tomorrow for formal assessment of swallowing safety and initiation of least restrictive oral diet consistency. ?Recommend continue NPO status w/ frequent oral care. ? ? ? ? ?Jerilynn Som, MS, CCC-SLP ?Speech Language Pathologist ?Rehab Services; [facility] -  ?[phone] (ascom) ?Lori Petersen ?05/17/2021, 4:56 PM ?

## 2021-05-17 NOTE — Consult Note (Signed)
CODE SEPSIS - PHARMACY COMMUNICATION ? ?**Broad Spectrum Antibiotics should be administered within 1 hour of Sepsis diagnosis** ? ?Time Code Sepsis Called/Page Received: 1117 ? ?Antibiotics Ordered: 1130 ? ?Time of 1st antibiotic administration: 1156 ? ?Additional action taken by pharmacy: contacted RN  ? ?If necessary, Name of Provider/Nurse Contacted: spoke w/ RN Earl Many ? ?Martyn Malay, PharmD ?Clinical Pharmacist  ?05/17/2021  11:18 AM ? ?

## 2021-05-17 NOTE — H&P (Signed)
?[facility] ?History and Physical ? ?Lori Petersen [facility] ZES:[id number] DOB: 11/10/1955 DOA: 05/17/2021 ? ?Referring physician: ED ? ?[facility]P: [facility]  ? ?Patient is coming from: Skilled nursing facility ? ?Chief Complaint: respiratory distress ? ?HPI: Lori Petersen is a 66 y.o. female Patient is a 66 years old female with past medical history of Down syndrome, heart failure, hypertension, dementia, hyperlipidemia presented to hospital with complaints of cough and shortness of breath.  Of note, patient was recently seen by her primary care physician as outpatient.  Initially patient had presented with some stuffy runny nose subsequently she had increasing shortness of breath.  She was noted to be hypoxic and EMS was called in.  Patient required nonrebreather mask in route to the hospital.  History is extremely limited due to patient's underlying dementia and Down syndrome and family not available.  ED provider had spoken with the PCP .  [facility]   No prior history of COPD. ? ?In the ED, patient was tachycardic, tachypneic with a temperature of 100.2 ?F, lactate was elevated at 2.4 and had leukocytosis at 13.6.  Creatinine mildly elevated at 1.4.  She was in respiratory distress and was on nonrebreather mask initially but due to  had increased work of breathing with diffuse wheezing, she was subsequently placed on BiPAP.  EKG showed sinus tachycardia.  Chest-ray however did not show any focal abnormality.  Urinalysis showed turbid urine with significant glucose and protein and large leukocytes and WBC more than 50. Blood cultures were sent from the ED including urinalysis.  Code sepsis was activated in the ED.  COVID and influenza was negative.  Patient received IV fluid bolus followed by vancomycin and cefepime, IV Solu-Medrol 125 mg x 1. Patient was then considered for admission to the hospital for possible sepsis secondary to pneumonia with hypoxic  respiratory failure. ? ?Assessment and plan. ? ?Principal Problem: ?  Acute respiratory failure with hypoxia (HCC) ?Active Problems: ?  Chronic diastolic CHF (congestive heart failure) (HCC) ?  Hypothyroid ?  Mixed Alzheimer's and vascular dementia (HCC) ?  Obesity, Class III, BMI 40-49.9 (morbid obesity) (HCC) ?  Pneumonia ?  Dementia (HCC) ?  HLD (hyperlipidemia) ?  Sepsis (HCC) ?  ?Acute hypoxic respiratory failure on presentation.   ?Patient presented with significant hypoxia on nonrebreather mask followed by BiPAP for increased work of breathing hypoxia and respiratory distress with multifocal pneumonia.  Continue with broad-spectrum antibiotic for typical pneumonia.  Will obtain speech and swallow evaluation.  Follow blood cultures.  Continue DuoNebs, inhalers.  Chest x-ray was negative for infiltrates.  CT scan of the chest with multifocal pneumonia in the right lung.  Currently on BiPAP.  Will obtain ABG for follow-up.  Wean BiPAP if possible. ? ?Possible mild metabolic encephalopathy likely secondary to pneumonia.  Has underlying dementia and Down syndrome.  We will continue to monitor. ? ?Sepsis  secondary to multifocal pneumonia/UTI. ?Patient presented with features of sepsis including tachycardia and tachypnea fever leukocytosis with elevated lactate with features suggestive of pneumonia with respiratory distress and failure.  Initial x-ray of the chest was negative.  CT scan of the chest showed patchy pulmonary opacities in the right upper right lower and middle lobes compatible with pneumonia.  Continue bronchodilators.  Consult RT for further management.  Follow urine cultures, blood cultures.  We will follow Legionella and strep urinary antigen. ? ?UTI.  Grossly abnormal urinalysis.  History of left ureteral stent  in the past with distal left ureteral stone with persistent mild left hydroureteronephrosis.  We will follow urine cultures.  Continue with broad-spectrum antibiotic.  Follow urine  cultures. ? ?Down syndrome/dementia.  Poor historian.  Possibility of aspiration.  We will get a speech and swallow evaluation.  Patient is on Keppra as outpatient will resume. ? ?History of congestive heart failure.  On Jardiance.  Will hold for now.  Reviewed 2D echocardiogram from 03/25/2021 showed LV ejection fraction of 55 to 60% with no regional wall motion abnormality. ? ?History of hypertension.  Not on antihypertensives at home.  We will continue to monitor while in the hospital. ? ?Hyperlipidemia.  On pravastatin.  Will resume when oral able. ? ?Hypothyroidism.  Patient is on Synthroid at home, will resume as able. ? ?Review of Systems:  ?Limited due to patient's condition including Down syndrome and dementia with BiPAP in place. ? ?Past Medical History:  ?Diagnosis Date  ? Arthritis   ? lower spine  ? Asthma   ? Dementia (HCC)   ? Diastolic congestive heart failure (HCC)   ? "only has problems with bad colds"  ? Down's syndrome   ? Full dentures   ? does not wear  ? GERD (gastroesophageal reflux disease)   ? barrett's  ? Glaucoma   ? HLD (hyperlipidemia)   ? Hypothyroidism   ? Nephrolithiasis   ? Onychomycosis   ? OSA (obstructive sleep apnea)   ? Pneumonia   ? Pre-diabetes   ? Seizures (HCC)   ? ?Past Surgical History:  ?Procedure Laterality Date  ? BREAST BIOPSY Left 04/05/2016  ? neg. cylinder clip  ? BREAST BIOPSY Left 12/06/2016  ? ribbon clip. path pending  ? COLONOSCOPY WITH PROPOFOL N/A 04/24/2016  ? Procedure: COLONOSCOPY WITH PROPOFOL;  Surgeon: Midge Minium, MD;  Location: [facility];  Service: Endoscopy;  Laterality: N/A;  Leave patient at 10:25 due to transportation  ? CYSTOSCOPY WITH STENT PLACEMENT Left 03/24/2021  ? Procedure: CYSTOSCOPY WITH STENT PLACEMENT;  Surgeon: Sondra Come, MD;  Location: [facility];  Service: Urology;  Laterality: Left;  ? ESOPHAGOGASTRODUODENOSCOPY (EGD) WITH PROPOFOL N/A 04/24/2016  ? Procedure: ESOPHAGOGASTRODUODENOSCOPY (EGD) WITH PROPOFOL;  Surgeon:  Midge Minium, MD;  Location: [facility];  Service: Endoscopy;  Laterality: N/A;  ? HERNIA REPAIR    ? ? ?Social History:  reports that she has never smoked. She has never used smokeless tobacco. She reports that she does not currently use drugs. She reports that she does not drink alcohol. ? ?No Known Allergies ? ?History reviewed. No pertinent family history.  ? ?Prior to Admission medications   ?Medication Sig Start Date End Date Taking? Authorizing Provider  ?aspirin EC 81 MG tablet Take 81 mg by mouth daily.   Yes [provider]  ?budesonide-formoterol (SYMBICORT) 80-4.5 MCG/ACT inhaler Inhale 2 puffs into the lungs 2 (two) times daily. With spacer device   Yes [provider]  ?cholecalciferol (VITAMIN D3) 25 MCG (1000 UT) tablet Take 2,000 Units by mouth daily.   Yes [provider]  ?DESITIN 13 % CREA Apply 1 application. topically in the morning and at bedtime. 03/29/21  Yes [provider]  ?empagliflozin (JARDIANCE) 10 MG TABS tablet Take 10 mg by mouth daily.   Yes [provider]  ?latanoprost (XALATAN) 0.005 % ophthalmic solution Place 1 drop into the left eye at bedtime.   Yes [provider]  ?levETIRAcetam (KEPPRA) 250 MG tablet Take 250 mg by mouth 2 (two)  times daily. (Take with  tablet to equal  total)   Yes [provider]  ?levETIRAcetam (KEPPRA) 500 MG tablet Take 500 mg by mouth 2 (two) times daily. (Take with  tablet to equal  total)   Yes [provider]  ?levothyroxine (SYNTHROID) 100 MCG tablet Take 100 mcg by mouth daily before breakfast.   Yes [provider]  ?omeprazole (PRILOSEC) 20 MG capsule Take 20 mg by mouth daily.   Yes [provider]  ?pravastatin (PRAVACHOL) 40 MG tablet Take 40 mg by mouth at bedtime.   Yes [provider]  ?senna (SENOKOT) 8.6 MG TABS tablet Take 2 tablets by mouth in the morning and at bedtime.   Yes [provider]   ?vitamin B-12 (CYANOCOBALAMIN) 1000 MCG tablet Take 1,000 mcg by mouth daily.   Yes [provider]  ?Zinc Oxide 13 % CREA Apply 1 application. topically 3 (three) times daily as needed (skin protection). Apply

## 2021-05-17 NOTE — Sepsis Progress Note (Signed)
eLink is following this Code Sepsis. °

## 2021-05-18 ENCOUNTER — Inpatient Hospital Stay: Payer: Medicare (Managed Care)

## 2021-05-18 DIAGNOSIS — N390 Urinary tract infection, site not specified: Secondary | ICD-10-CM

## 2021-05-18 DIAGNOSIS — A419 Sepsis, unspecified organism: Secondary | ICD-10-CM | POA: Diagnosis not present

## 2021-05-18 DIAGNOSIS — J9601 Acute respiratory failure with hypoxia: Secondary | ICD-10-CM | POA: Diagnosis not present

## 2021-05-18 DIAGNOSIS — J9690 Respiratory failure, unspecified, unspecified whether with hypoxia or hypercapnia: Secondary | ICD-10-CM | POA: Diagnosis present

## 2021-05-18 DIAGNOSIS — F0390 Unspecified dementia without behavioral disturbance: Secondary | ICD-10-CM | POA: Diagnosis not present

## 2021-05-18 LAB — BLOOD CULTURE ID PANEL (REFLEXED) - BCID2

## 2021-05-18 LAB — COMPREHENSIVE METABOLIC PANEL
ALT: 6 U/L (ref 0–44)
AST: 17 U/L (ref 15–41)
Albumin: 2.6 g/dL — ABNORMAL LOW (ref 3.5–5.0)
Alkaline Phosphatase: 33 U/L — ABNORMAL LOW (ref 38–126)
Anion gap: 10 (ref 5–15)
BUN: 15 mg/dL (ref 8–23)
CO2: 18 mmol/L — ABNORMAL LOW (ref 22–32)
Calcium: 6.9 mg/dL — ABNORMAL LOW (ref 8.9–10.3)
Chloride: 116 mmol/L — ABNORMAL HIGH (ref 98–111)
Creatinine, Ser: 0.87 mg/dL (ref 0.44–1.00)
GFR, Estimated: >60 mL/min (ref >60)
Glucose, Bld: 122 mg/dL — ABNORMAL HIGH (ref 70–99)
Potassium: 3.5 mmol/L (ref 3.5–5.1)
Sodium: 144 mmol/L (ref 135–145)
Total Bilirubin: 0.4 mg/dL (ref 0.3–1.2)
Total Protein: 6.6 g/dL (ref 6.5–8.1)

## 2021-05-18 LAB — CBC
HCT: 38.5 % (ref 36.0–46.0)
Hemoglobin: 10.9 g/dL — ABNORMAL LOW (ref 12.0–15.0)
MCH: 30.1 pg (ref 26.0–34.0)
MCHC: 28.3 g/dL — ABNORMAL LOW (ref 30.0–36.0)
MCV: 106.4 fL — ABNORMAL HIGH (ref 80.0–100.0)
Platelets: 138 10*3/uL — ABNORMAL LOW (ref 150–400)
RBC: 3.62 MIL/uL — ABNORMAL LOW (ref 3.87–5.11)
RDW: 16.4 % — ABNORMAL HIGH (ref 11.5–15.5)
WBC: 8.3 10*3/uL (ref 4.0–10.5)
nRBC: 0 % (ref 0.0–0.2)

## 2021-05-18 LAB — URINE CULTURE: Culture: 20000 — AB

## 2021-05-18 LAB — MAGNESIUM: Magnesium: 1.9 mg/dL (ref 1.7–2.4)

## 2021-05-18 MED ORDER — SODIUM CHLORIDE 0.9 % IV SOLN
2.0000 g | Freq: Three times a day (TID) | INTRAVENOUS | Status: DC
  Administered 2021-05-18 – 2021-05-20 (×6): 2 g via INTRAVENOUS
  Filled 2021-05-18 (×8): qty 12.5
  Filled 2021-05-18: qty 2

## 2021-05-18 MED ORDER — LORAZEPAM 2 MG/ML IJ SOLN: 1.0000 mg | INTRAMUSCULAR | Status: DC | PRN

## 2021-05-18 MED ORDER — VANCOMYCIN HCL 1500 MG/300ML IV SOLN
1500.0000 mg | INTRAVENOUS | Status: DC
  Administered 2021-05-18: 1500 mg via INTRAVENOUS
  Filled 2021-05-18: qty 300

## 2021-05-18 MED ORDER — GUAIFENESIN-DM 100-10 MG/5ML PO SYRP
5.0000 mL | ORAL_SOLUTION | ORAL | Status: DC | PRN
  Administered 2021-05-18: 5 mL via ORAL
  Filled 2021-05-18: qty 10

## 2021-05-18 NOTE — TOC Initial Note (Addendum)
Transition of Care (TOC) - Initial/Assessment Note  ? ? ?Patient Details  ?Name: Lori Petersen [facility] ?MRN: [id number] ?Date of Birth: [birth year] ? ?Transition of Care (TOC) CM/SW Contact:    ?Gildardo Griffes, LCSW ?Phone Number: ?05/18/2021, 8:52 AM ? ?Clinical Narrative:                 ? ? ?Update : Sherri with [facility] confirms plan to return to [facility] long [facility] at time of discharge. Per Sherri she reports patient had a planned surgery for Friday at [facility] to remove a urethral stone. She wishes this to be relayed to MD to see if it will need to be rescheduled, CSW has updated treatment team.  ? ? ?Patient resides at [facility] for long-term care.  ? ?Patient's main contact and legal guardian Cousin,[name]  [phone]. Patient active with [facility], Sheri contact information is cell-[phone] or call [phone]. Sherri requested d/c update for [facility] to arrange transportation.   ? ?At dc [facility] will provide transport back to [facility] via wheel chair Zenaida Niece (call [phone]) when ready.  ? ?CSW has LVM with guardian Nicole Cella, provided contact info. LVM with Sheri with [facility].  ? ?Expected Discharge Plan: Skilled Nursing Facility ?Barriers to Discharge: Continued Medical Work up ? ? ?Patient Goals and CMS Choice ?Patient states their goals for this hospitalization and ongoing recovery are:: to go home ?CMS Medicare.gov Compare Post Acute Care list provided to:: Legal Guardian (has legal guardain dorothy) ?  ? ?Expected Discharge Plan and Services ?Expected Discharge Plan: Skilled Nursing Facility ?  ?  ?  ?Living arrangements for the past 2 months: Skilled Nursing Facility ?                ?  ?  ?  ?  ?  ?  ?  ?  ?  ?  ? ?Prior Living Arrangements/Services ?Living arrangements for the past 2 months: Skilled Nursing Facility ?Lives with:: Facility Resident ?  ?       ?  ?  ?  ?  ? ?Activities of Daily Living ?  ?  ? ?Permission Sought/Granted ?  ?  ?   ?   ?   ?   ? ?Emotional Assessment ?  ?  ?  ?  ?  ?  ? ?Admission diagnosis:  Pneumonia  [J18.9] ?Patient Active Problem List  ? Diagnosis Date Noted  ? Pneumonia 05/17/2021  ? Dementia (HCC)   ? HLD (hyperlipidemia)   ? Sepsis (HCC)   ? Hypernatremia 03/26/2021  ? Thrombocytopenia (HCC) 03/26/2021  ? Asthma, mild intermittent 03/25/2021  ? Nausea & vomiting 03/25/2021  ? Bacteremia due to Escherichia coli 03/25/2021  ? Complicated UTI (urinary tract infection) 03/25/2021  ? Down syndrome 03/25/2021  ? AKI (acute kidney injury) (HCC) 03/24/2021  ? Septic shock (HCC)   ? Left ureteral stone   ? Seizures (HCC) 12/03/2018  ? Obesity, Class III, 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  ? Chronic diastolic CHF (congestive heart failure) (HCC) 11/06/2018  ? Acute metabolic encephalopathy 11/06/2018  ? Hypothyroid 11/06/2018  ? COVID-19 virus infection 11/03/2018  ? COVID-19   ? Acute respiratory failure with hypoxia (HCC) 11/02/2018  ? Left tibial fracture 04/27/2017  ? Right tibial fracture 04/27/2017  ? Problems with swallowing and mastication   ? Blood in stool   ? Bradycardia 05/14/2015  ? 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 Onychomycosis   ? ?PCP:  [facility] ?Pharmacy:   ?  [facility] [phone] [place], [place] - [address] ?[address] ?[place] [address] ?Phone: [phone] Fax: [phone] ? ?[facility] [phone] [place], [place] - [address] ?[address] ?[place] [address] ?Phone: [phone] Fax: [phone] ? ?[facility] - [place], [place] - [address] ?[address] ? [place] [address] ?Phone: [phone] Fax: [phone] ? ? ? ? ?Social Determinants of Health (SDOH) Interventions ?  ? ?Readmission Risk Interventions ?   ? View : No data to display.  ?  ?  ?  ? ? ? ?

## 2021-05-18 NOTE — Progress Notes (Signed)
?PROGRESS NOTE ? ? ? ?Lori Petersen [facility]  UDJ:[id number] DOB: 02-02-1955 DOA: 05/17/2021 ?PCP: [facility] [place]  ? ? ?Assessment & Plan: ?  ?Principal Problem: ?  Acute respiratory failure with hypoxia ([place]) ?Active Problems: ?  Chronic diastolic CHF (congestive heart failure) ([place]) ?  Hypothyroid ?  Mixed Alzheimer's and vascular dementia ([place]) ?  Obesity, Class III, BMI 40-49.9 (morbid obesity) ([place]) ?  Pneumonia ?  Dementia ([place]) ?  HLD (hyperlipidemia) ?  Sepsis ([place]) ? ?Assessment and Plan: ? ?  ?Acute hypoxic respiratory failure: initially required BIPAP but has since been weaned off. Continue on supplemental oxygen and wean as tolerated. Continue on IV abxs, bronchodilators & encourage incentive spirometry  ?  ?Mild metabolic encephalopathy:  likely secondary to pneumonia.  Has underlying dementia and Down syndrome.  Continue w/ supportive care ?  ?Sepsis: met criteria w/ tachycardia, tachypnea, fever, leukocytosis & secondary to multifocal pneumonia & UTI. Continue on IV cefepime, vanco  ?  ?UTI: w/ hx left ureteral stent in the past with distal left ureteral stone with persistent mild left hydroureteronephrosis. Urine cx is pending. Continue on IV cefepime. Scheduled on 05/20/21 for urethral stone removal outpatient  ? ?Unlikely bacteremia: blood cxs growing staph species, likely containment. Repeat blood cxs ordered ?  ?Dementia: w/ hx of down's syndrome.  Poor historian. Continue on home dose of keppra  ?  ?Likely chronic CHF: echo from 03/25/2021 showed EF 55 to 60% with no regional wall motion abnormality. ?  ?HLD: continue on statin  ?  ?Hypothyroidism: continue on home dose of synthroid  ? ?Thrombocytopenia: etiology unclear. Will continue to monitor  ? ? ? ? ?DVT prophylaxis: heparin  ?Code Status: full  ?Family Communication: ?Disposition Plan: likely d/c back to [facility]  ? ?Level of care: Stepdown ? ?Status is: Inpatient ?Remains inpatient appropriate because: severity of  illness ? ? ?Consultants:  ? ? ?Procedures: ? ?Antimicrobials: cefepime, vanco  ? ? ?Subjective: ?Pt denies any pain.  ? ?Objective: ?Vitals:  ? 05/18/21 0300 05/18/21 0330 05/18/21 0430 05/18/21 0530  ?BP: 112/63 (!) 114/55 115/67 125/71  ?Pulse: 71 72 90 80  ?Resp: (!) 21 (!) 28 (!) 27 (!) 26  ?Temp:      ?TempSrc:      ?SpO2: 100% 100% 91% 100%  ?Weight:      ?Height:      ? ? ?Intake/Output Summary (Last 24 hours) at 05/18/2021 0811 ?Last data filed at 05/17/2021 5885 ?Gross per 24 hour  ?Intake 2528.27 ml  ?Output --  ?Net 2528.27 ml  ? ?Filed Weights  ? 05/17/21 1102  ?Weight: 99.2 kg  ? ? ?Examination: ? ?General exam: Appears calm and comfortable  ?Respiratory system: decreased breath sounds b/l. ?Cardiovascular system: S1 & S2 +. No rubs, gallops or clicks.  ?Gastrointestinal system: Abdomen is nondistended, soft and nontender. Normal bowel sounds heard. ?Central nervous system: Alert and awake. Moves all extremities  ?Psychiatry: Judgement and insight appears at baseline. Flat mood and affect ? ? ? ?Data Reviewed: I have personally reviewed following labs and imaging studies ? ?CBC: ?Recent Labs  ?Lab 05/17/21 ?1110 05/18/21 ?0540  ?WBC 10.6* 8.3  ?NEUTROABS 8.5*  --   ?HGB 12.5 10.9*  ?HCT 42.5 38.5  ?MCV 100.2* 106.4*  ?PLT 221 138*  ? ?Basic Metabolic Panel: ?Recent Labs  ?Lab 05/17/21 ?1110 05/18/21 ?0540  ?NA 141 144  ?K 3.8 3.5  ?CL 102 116*  ?CO2 27 18*  ?GLUCOSE 163* 122*  ?BUN  14 15  ?CREATININE 1.45* 0.87  ?CALCIUM 8.6* 6.9*  ?MG  --  1.9  ? ?GFR: ?Estimated Creatinine Clearance: 79.4 mL/min (by C-G formula based on SCr of 0.87 mg/dL). ?Liver Function Tests: ?Recent Labs  ?Lab 05/17/21 ?1110 05/18/21 ?0540  ?AST 18 17  ?ALT 9 6  ?ALKPHOS 46 33*  ?BILITOT 0.6 0.4  ?PROT 7.7 6.6  ?ALBUMIN 3.1* 2.6*  ? ?No results for input(s): LIPASE, AMYLASE in the last 168 hours. ?No results for input(s): AMMONIA in the last 168 hours. ?Coagulation Profile: ?Recent Labs  ?Lab 05/17/21 ?1110  ?INR 1.1  ? ?Cardiac  Enzymes: ?No results for input(s): CKTOTAL, CKMB, CKMBINDEX, TROPONINI in the last 168 hours. ?BNP (last 3 results) ?No results for input(s): PROBNP in the last 8760 hours. ?HbA1C: ?Recent Labs  ?  05/17/21 ?1308  ?HGBA1C 5.9*  ? ?CBG: ?No results for input(s): GLUCAP in the last 168 hours. ?Lipid Profile: ?No results for input(s): CHOL, HDL, LDLCALC, TRIG, CHOLHDL, LDLDIRECT in the last 72 hours. ?Thyroid Function Tests: ?Recent Labs  ?  05/17/21 ?1308  ?TSH 1.069  ? ?Anemia Panel: ?No results for input(s): VITAMINB12, FOLATE, FERRITIN, TIBC, IRON, RETICCTPCT in the last 72 hours. ?Sepsis Labs: ?Recent Labs  ?Lab 05/17/21 ?1110 05/17/21 ?1308  ?LATICACIDVEN 2.4* 1.6  ? ? ?Recent Results (from the past 240 hour(s))  ?Blood Culture (routine x 2)     Status: None (Preliminary result)  ? Collection Time: 05/17/21 11:10 AM  ? Specimen: BLOOD  ?Result Value Ref Range Status  ? Specimen Description BLOOD RIGHT ANTECUBITAL  Final  ? Special Requests   Final  ?  BOTTLES DRAWN AEROBIC AND ANAEROBIC Blood Culture adequate volume  ? Culture   Final  ?  NO GROWTH < 24 HOURS ?Performed at [facility], [address], [place], [place] [address] ?  ? Report Status PENDING  Incomplete  ?Blood Culture (routine x 2)     Status: None (Preliminary result)  ? Collection Time: 05/17/21 11:10 AM  ? Specimen: BLOOD  ?Result Value Ref Range Status  ? Specimen Description BLOOD RIGHT FA  Final  ? Special Requests   Final  ?  BOTTLES DRAWN AEROBIC AND ANAEROBIC Blood Culture results may not be optimal due to an inadequate volume of blood received in culture bottles  ? Culture   Final  ?  NO GROWTH < 24 HOURS ?Performed at [facility], [address] [place], [place] [address] ?  ? Report Status PENDING  Incomplete  ?Resp Panel by RT-PCR (Flu A&B, Covid) Nasopharyngeal Swab     Status: None  ? Collection Time: 05/17/21 11:10 AM  ? Specimen: Nasopharyngeal Swab; Nasopharyngeal(NP) swabs in vial transport medium  ?Result  Value Ref Range Status  ? SARS Coronavirus 2 by RT PCR NEGATIVE NEGATIVE Final  ?  Comment: (NOTE) ?SARS-CoV-2 target nucleic acids are NOT DETECTED. ? ?The SARS-CoV-2 RNA is generally detectable in upper respiratory ?specimens during the acute phase of infection. The lowest ?concentration of SARS-CoV-2 viral copies this assay can detect is ?138 copies/mL. A negative result does not preclude SARS-Cov-2 ?infection and should not be used as the sole basis for treatment or ?other patient management decisions. A negative result may occur with  ?improper specimen collection/handling, submission of specimen other ?than nasopharyngeal swab, presence of viral mutation(s) within the ?areas targeted by this assay, and inadequate number of viral ?copies(<138 copies/mL). A negative result must be combined with ?clinical observations, patient history, and epidemiological ?information. The  expected result is Negative. ? ?Fact Sheet for Patients:  ?EntrepreneurPulse.com.au ? ?Fact Sheet for Healthcare Providers:  ?IncredibleEmployment.be ? ?This test is no t yet approved or cleared by the [place] FDA and  ?has been authorized for detection and/or diagnosis of SARS-CoV-2 by ?FDA under an Emergency Use Authorization (EUA). This EUA will remain  ?in effect (meaning this test can be used) for the duration of the ?COVID-19 declaration under Section 564(b)(1) of the Act, 21 ?U.S.C.section 360bbb-3(b)(1), unless the authorization is terminated  ?or revoked sooner.  ? ? ?  ? Influenza A by PCR NEGATIVE NEGATIVE Final  ? Influenza B by PCR NEGATIVE NEGATIVE Final  ?  Comment: (NOTE) ?The Xpert Xpress SARS-CoV-2/FLU/RSV plus assay is intended as an aid ?in the diagnosis of influenza from Nasopharyngeal swab specimens and ?should not be used as a sole basis for treatment. Nasal washings and ?aspirates are unacceptable for Xpert Xpress SARS-CoV-2/FLU/RSV ?testing. ? ?Fact Sheet for  Patients: ?EntrepreneurPulse.com.au ? ?Fact Sheet for Healthcare Providers: ?IncredibleEmployment.be ? ?This test is not yet approved or cleared by the [place] and ?has been authorized for detection

## 2021-05-18 NOTE — ED Notes (Signed)
Pt removed from bipap placed on 4lnc, pt maintaining airway and oxygen sats remain >92% , RRT made aware  ?

## 2021-05-18 NOTE — Evaluation (Signed)
Objective Swallowing Evaluation: Type of Study: MBS-Modified Barium Swallow Study ?  ?Patient Details  ?Name: Lori Petersen [facility] ?MRN: [id number] ?Date of Birth: 30-Jul-1955 ? ?Today's Date: 05/18/2021 ?Time: SLP Start Time (ACUTE ONLY): 0845 ?-SLP Stop Time (ACUTE ONLY): 0945 ? ?SLP Time Calculation (min) (ACUTE ONLY): 60 min ? ? ?Past Medical History:  ?Past Medical History:  ?Diagnosis Date  ? Arthritis   ? lower spine  ? Asthma   ? Dementia (HCC)   ? Diastolic congestive heart failure (HCC)   ? "only has problems with bad colds"  ? Down's syndrome   ? Full dentures   ? does not wear  ? GERD (gastroesophageal reflux disease)   ? barrett's  ? Glaucoma   ? HLD (hyperlipidemia)   ? Hypothyroidism   ? Nephrolithiasis   ? Onychomycosis   ? OSA (obstructive sleep apnea)   ? Pneumonia   ? Pre-diabetes   ? Seizures (HCC)   ? ?Past Surgical History:  ?Past Surgical History:  ?Procedure Laterality Date  ? BREAST BIOPSY Left 04/05/2016  ? neg. cylinder clip  ? BREAST BIOPSY Left 12/06/2016  ? ribbon clip. path pending  ? COLONOSCOPY WITH PROPOFOL N/A 04/24/2016  ? Procedure: COLONOSCOPY WITH PROPOFOL;  Surgeon: Midge Minium, MD;  Location[facility];  Service: Endoscopy;  Laterality: N/A;  Leave patient at 10:25 due to transportation  ? CYSTOSCOPY WITH STENT PLACEMENT Left 03/24/2021  ? Procedure: CYSTOSCOPY WITH STENT PLACEMENT;  Surgeon: Sondra Come, MD;  Location: [facility];  Service: Urology;  Laterality: Left;  ? ESOPHAGOGASTRODUODENOSCOPY (EGD) WITH PROPOFOL N/A 04/24/2016  ? Procedure: ESOPHAGOGASTRODUODENOSCOPY (EGD) WITH PROPOFOL;  Surgeon: Midge Minium, MD;  Location: [facility];  Service: Endoscopy;  Laterality: N/A;  ? HERNIA REPAIR    ? ?HPI: Pt  is a 66 y.o. female Patient is a 66 years old female who resides at SNF with past medical history of Down syndrome, Dementia, GERD, hiatal hernia, heart failure, hypertension, dementia, hyperlipidemia presented to hospital with complaints of cough and shortness of  breath.  Of note, patient was recently seen by her primary care physician as outpatient.  Initially patient had presented with some stuffy runny nose subsequently she had increasing shortness of breath.  She was noted to be hypoxic and EMS was called in.  Patient required nonrebreather mask in route to the hospital.  History is extremely limited due to patient's underlying dementia and Down syndrome and family not available.  ED provider had spoken with the PCP .  Patient has been followed by [facility].   No prior history of COPD.     In the ED, patient was tachycardic, tachypneic with a temperature of 100.2 ?F, lactate was elevated at 2.4 and had leukocytosis at 13.6.  Creatinine mildly elevated at 1.4.  She was in respiratory distress and was on nonrebreather mask initially but due to  had increased work of breathing with diffuse wheezing, she was subsequently placed on BiPAP.  EKG showed sinus tachycardia.  Patient was then considered for admission to the hospital for possible sepsis secondary to pneumonia with hypoxic respiratory failure.  Chest CT: "Lungs/Pleura: No pleural effusion or pneumothorax. Limited  assessment of the lung parenchyma due to respiratory motion  artifact. Small areas of consolidation and clustered nodular  opacities in the posterior right upper lobe, right lower lobe, and  right middle lobe. Mild dependent opacity in the left lower lobe, likely atelectasis.". ?  ?Subjective: pt awake, responded to basic direct  questions re: self. Needed full cues for follow through w/ tasks. Baseline deficits. ? ? ? Recommendations for follow up therapy are one component of a multi-disciplinary discharge planning process, led by the attending physician.  Recommendations may be updated based on patient status, additional functional criteria and insurance authorization. ? ?Assessment / Plan / Recommendation ? ? ?  05/18/2021  ?  4:00 PM  ?Clinical Impressions  ?Clinical Impression Pt seen for MBSS  today in setting of admit w/ chronic issues including Down Syndrome and Dementia; GERD. Also noted, pt has a Hiatal Hernia per chart notes. She resides at a SNF.  ? ?Pt appears to present w/ no pharyngeal phase dysphagia; Mild-Moderate oral phase dysphagia c/b more of a munching pattern w/ increased textured foods w/ increased oral phase time for bolus management and clearing. Pt is also Edentulous which impacts effective mastication of increased textured foods. Given Time and moistening solids well, pt appeared able to adequately manage the soft-solid food trials during this study. No overt sensorimotor deficits noted. No aspiration nor penetration of po trials was noted to occur during this study.  ?W/ the baseline of Down Syndrome and Dementia w/ Impulsive drinking and eating behaviors, this could increase risk for oropharyngeal phase dysphagia. ANY Cognitive decline can impact overall awareness/timing of swallow and safety during po tasks especially during impulsive oral intake which increases risk for aspiration, choking. Pt's risk for aspiration can be reduced when following general aspiration precautions, having Supervision during ALL meals to monitor for Impulsive eating/drinking behaviors(ie: Pinch straw to limit volume, Small single bites), and using a modified diet consistency w/ gravies/condiments to moisten foods for ease of mashing/gumming.    ? ?During the oral phase, adequate bolus management was noted w/ puree and thin liquids, though Impulsive w/ consecutive sips requiring Pinched straw. No anterior leakage. Increased oral phase time for mashing/gumming foods noted w/ increased textured trials. W/ Time, she was able to transfer, swallow, and clear mouth appropriately -- Small bites given.  ?During the pharyngeal phase, fairly timely pharyngeal swallow initiation noted w/ all trial consistencies. No aspiration nor gross penetration occurred; possible slight amount of penetration occurred w/ thin  liquids x1 w/ premature spillage w/ consecutive sips. Overall, airway closure appeared timely, tight. No pharyngeal residue remained post swallow indicating adequate laryngeal excursion and pharyngeal pressure during the swallow.    ? ?Discussed results of MBSS w/ pt's [facility] MD via TC and Hospitalist. Recommended further management of GERD w/ a PPI and REFLUX precautions at the SNF; especially NOT to lie down post meals and to eat frequent, small meals. F/u w/ GI for further assessment, management of Reflux can be sought. Pt does have a dx of Hiatal Hernia per chart notes also.  ?SLP Visit Diagnosis Dysphagia, oral phase (R13.11)  ?Impact on safety and function Mild aspiration risk;Risk for inadequate nutrition/hydration  ? ?   ? ?  05/18/2021  ?  4:00 PM  ?Treatment Recommendations  ?Treatment Recommendations Therapy as outlined in treatment plan below  ?   ? ?  05/18/2021  ?  4:00 PM  ?Prognosis  ?Prognosis for Safe Diet Advancement Fair  ?Barriers to Reach Goals Cognitive deficits;Language deficits;Time post onset;Severity of deficits;Behavior  ?Barriers/Prognosis Comment baseline Down Syndrome and Dementia; GERD  ? ? ? ?  05/18/2021  ?  4:00 PM  ?Diet Recommendations  ?SLP Diet Recommendations Dysphagia 3 (Mech soft) solids;Thin liquid  ?Liquid Administration via Cup;Straw  ?Medication Administration Crushed with puree  ?Compensations Minimize environmental distractions;Slow  rate;Small sips/bites;Lingual sweep for clearance of pocketing;Multiple dry swallows after each bite/sip;Follow solids with liquid  ?Postural Changes Remain semi-upright after after feeds/meals (Comment);Seated upright at 90 degrees  ?   ? ? ?  05/18/2021  ?  4:00 PM  ?Other Recommendations  ?Recommended Consults Consider GI evaluation  ?Oral Care Recommendations Oral care BID;Oral care before and after PO;Staff/trained caregiver to provide oral care  ?Other Recommendations --  ?Follow Up Recommendations Follow physician's recommendations for  discharge plan and follow up therapies  ?Assistance recommended at discharge Frequent or constant Supervision/Assistance  ?Functional Status Assessment Patient has had a recent decline in their functional status

## 2021-05-18 NOTE — Progress Notes (Signed)
Admission profile updated. ?

## 2021-05-18 NOTE — Progress Notes (Signed)
PHARMACY - PHYSICIAN COMMUNICATION ?CRITICAL VALUE ALERT - BLOOD CULTURE IDENTIFICATION (BCID) ? ?Anyra Kaufman [facility] is an 66 y.o. female who presented to [facility] on 05/17/2021 with a chief complaint of respiratory distress ? ?Assessment:  on vancomycin and cefepime currently for sepsis with pneumonia as source.  Blood cultures with GPC in 1/4 bottles showing staphylococcus species (NOT S aureus, epidermidis) ? ?Name of physician (or Provider) Contacted: Wilfred Lacy ? ?Current antibiotics: vancomycin and cefepme ? ?Changes to prescribed antibiotics recommended:  ?Patient is on recommended antibiotics - No changes needed ?Consider repeat blood cx ? ?Results for orders placed or performed during the hospital encounter of 05/17/21  ?Blood Culture ID Panel (Reflexed) (Collected: 05/17/2021 11:10 AM)  ?Result Value Ref Range  ? Enterococcus faecalis NOT DETECTED NOT DETECTED  ? Enterococcus Faecium NOT DETECTED NOT DETECTED  ? Listeria monocytogenes NOT DETECTED NOT DETECTED  ? Staphylococcus species DETECTED (A) NOT DETECTED  ? Staphylococcus aureus (BCID) NOT DETECTED NOT DETECTED  ? Staphylococcus epidermidis NOT DETECTED NOT DETECTED  ? Staphylococcus lugdunensis NOT DETECTED NOT DETECTED  ? Streptococcus species NOT DETECTED NOT DETECTED  ? Streptococcus agalactiae NOT DETECTED NOT DETECTED  ? Streptococcus pneumoniae NOT DETECTED NOT DETECTED  ? Streptococcus pyogenes NOT DETECTED NOT DETECTED  ? A.calcoaceticus-baumannii NOT DETECTED NOT DETECTED  ? Bacteroides fragilis NOT DETECTED NOT DETECTED  ? Enterobacterales NOT DETECTED NOT DETECTED  ? Enterobacter cloacae complex NOT DETECTED NOT DETECTED  ? Escherichia coli NOT DETECTED NOT DETECTED  ? Klebsiella aerogenes NOT DETECTED NOT DETECTED  ? Klebsiella oxytoca NOT DETECTED NOT DETECTED  ? Klebsiella pneumoniae NOT DETECTED NOT DETECTED  ? Proteus species NOT DETECTED NOT DETECTED  ? Salmonella species NOT DETECTED NOT DETECTED  ? Serratia marcescens NOT DETECTED NOT  DETECTED  ? Haemophilus influenzae NOT DETECTED NOT DETECTED  ? Neisseria meningitidis NOT DETECTED NOT DETECTED  ? Pseudomonas aeruginosa NOT DETECTED NOT DETECTED  ? Stenotrophomonas maltophilia NOT DETECTED NOT DETECTED  ? Candida albicans NOT DETECTED NOT DETECTED  ? Candida auris NOT DETECTED NOT DETECTED  ? Candida glabrata NOT DETECTED NOT DETECTED  ? Candida krusei NOT DETECTED NOT DETECTED  ? Candida parapsilosis NOT DETECTED NOT DETECTED  ? Candida tropicalis NOT DETECTED NOT DETECTED  ? Cryptococcus neoformans/gattii NOT DETECTED NOT DETECTED  ? ? ?Juliette Alcide, PharmD, BCPS, BCIDP ?Work Cell: [phone] ?05/18/2021 10:48 AM ? ? ? ?

## 2021-05-18 NOTE — Progress Notes (Signed)
Pharmacy Antibiotic Note ? ?Lori Petersen [facility] is a 66 y.o. female w/ h/o down syndrome, heart failure, HTN, dementia, HLD presented to hospital with complaints of cough and SOB admitted on 05/17/2021 with sepsis (c/f urinary or respiratory source.  Pharmacy has been consulted for Vancomycin & Cefepime dosing. ? ?5/02 - CT scan positive for multifocal pneumonia in the rt lung. ?5/02 - UA abnormal (turbid, WBC >50, few bacteria)  ISO left ureteral stent and persistent mild left hydronephrosis. ? ?Plan: ?*Dose recalculated after significant improvement in Scr (drop from 1.45 to 0.87) (BL 0.9-1), F/u MRSA PCR ordered & cultures. ? ?Cefepime 2g IV q12h , dose increased to Cefepime 2gm q8hrs ? ?Vancomycin 1g IV x1 in ED (5/02 1200) will complete loading dose with 1250mg  x1 ED @ 1725 (total load of ~22.7mg /kg); followed by vancomycin 1500mg  q24h ?Goal AUC 400-550  ?Est AUC: 518; Cmax: 40.1 Cmin: 10.8 ?SCr 0.87; IBW; Vd 0.5 ? ?Height: 5\' 8"  (172.7 cm) ?Weight: 99.2 kg (218 lb 12.8 oz) ?IBW/kg (Calculated) : 63.9 ? ?Temp (24hrs), Avg:99.8 ?F (37.7 ?C), Min:99.3 ?F (37.4 ?C), Max:100.2 ?F (37.9 ?C) ? ?Recent Labs  ?Lab 05/17/21 ?1110 05/17/21 ?1308 05/18/21 ?0540  ?WBC 10.6*  --  8.3  ?CREATININE 1.45*  --  0.87  ?LATICACIDVEN 2.4* 1.6  --   ? ?  ?Estimated Creatinine Clearance: 79.4 mL/min (by C-G formula based on SCr of 0.87 mg/dL).   ? ?No Known Allergies ? ?Antimicrobials this admission: ?Vanc/CFP ( 5/02 >>  ? ? ?Dose adjustments this admission: ?CTM and adjust as needed. Pt Scr 1.45 currently (BL closer to 0.9-1) ?5/3 Scr 0.87 -vancomycin and cefepime dose changed per protocol ? ?Microbiology results: ?5/02 BCx: pending ?5/02 UCx: pending  ?5/02 Flu/Cov & Strep Ur Ag & Legionella Ur Ag: negative  ?5/02 MRSA PCR: ordered ? ?Thank you for allowing pharmacy to be a part of this patient?s care. ? ?Delila Kuklinski Rodriguez-Guzman PharmD, BCPS ?05/18/2021 8:21 AM ? ? ?

## 2021-05-19 ENCOUNTER — Inpatient Hospital Stay: Payer: Medicare (Managed Care)

## 2021-05-19 DIAGNOSIS — N179 Acute kidney failure, unspecified: Secondary | ICD-10-CM

## 2021-05-19 DIAGNOSIS — F0390 Unspecified dementia without behavioral disturbance: Secondary | ICD-10-CM | POA: Diagnosis not present

## 2021-05-19 DIAGNOSIS — J9601 Acute respiratory failure with hypoxia: Secondary | ICD-10-CM | POA: Diagnosis not present

## 2021-05-19 LAB — BASIC METABOLIC PANEL
Anion gap: 10 (ref 5–15)
BUN: 21 mg/dL (ref 8–23)
CO2: 23 mmol/L (ref 22–32)
Calcium: 8 mg/dL — ABNORMAL LOW (ref 8.9–10.3)
Chloride: 109 mmol/L (ref 98–111)
Creatinine, Ser: 1.33 mg/dL — ABNORMAL HIGH (ref 0.44–1.00)
GFR, Estimated: 44 mL/min — ABNORMAL LOW (ref >60)
Glucose, Bld: 131 mg/dL — ABNORMAL HIGH (ref 70–99)
Potassium: 4.3 mmol/L (ref 3.5–5.1)
Sodium: 142 mmol/L (ref 135–145)

## 2021-05-19 LAB — BLOOD GAS, ARTERIAL
Acid-base deficit: 0.7 mmol/L (ref 0.0–2.0)
Bicarbonate: 24.9 mmol/L (ref 20.0–28.0)
Delivery systems: POSITIVE
Expiratory PAP: 6 cmH2O
FIO2: 40 %
Inspiratory PAP: 14 cmH2O
O2 Saturation: 99.7 %
Patient temperature: 37
RATE: 10 resp/min
pCO2 arterial: 44 mmHg (ref 32–48)
pH, Arterial: 7.36 (ref 7.35–7.45)
pO2, Arterial: 122 mmHg — ABNORMAL HIGH (ref 83–108)

## 2021-05-19 LAB — CBC
HCT: 38 % (ref 36.0–46.0)
Hemoglobin: 11.5 g/dL — ABNORMAL LOW (ref 12.0–15.0)
MCH: 30 pg (ref 26.0–34.0)
MCHC: 30.3 g/dL (ref 30.0–36.0)
MCV: 99.2 fL (ref 80.0–100.0)
Platelets: 168 10*3/uL (ref 150–400)
RBC: 3.83 MIL/uL — ABNORMAL LOW (ref 3.87–5.11)
RDW: 16.3 % — ABNORMAL HIGH (ref 11.5–15.5)
WBC: 15.5 10*3/uL — ABNORMAL HIGH (ref 4.0–10.5)
nRBC: 0 % (ref 0.0–0.2)

## 2021-05-19 LAB — LEGIONELLA PNEUMOPHILA SEROGP 1 UR AG: L. pneumophila Serogp 1 Ur Ag: NEGATIVE

## 2021-05-19 LAB — GLUCOSE, CAPILLARY: Glucose-Capillary: 142 mg/dL — ABNORMAL HIGH (ref 70–99)

## 2021-05-19 LAB — MRSA NEXT GEN BY PCR, NASAL: MRSA by PCR Next Gen: NOT DETECTED

## 2021-05-19 MED ORDER — VANCOMYCIN HCL 1250 MG/250ML IV SOLN: 1250.0000 mg | INTRAVENOUS | Status: DC

## 2021-05-19 MED ORDER — FUROSEMIDE 10 MG/ML IJ SOLN
40.0000 mg | Freq: Once | INTRAMUSCULAR | Status: AC
  Administered 2021-05-19: 40 mg via INTRAVENOUS
  Filled 2021-05-19: qty 4

## 2021-05-19 MED ORDER — IPRATROPIUM-ALBUTEROL 0.5-2.5 (3) MG/3ML IN SOLN
3.0000 mL | RESPIRATORY_TRACT | Status: DC
  Administered 2021-05-19 – 2021-05-22 (×17): 3 mL via RESPIRATORY_TRACT
  Filled 2021-05-19 (×18): qty 3

## 2021-05-19 MED ORDER — HYDRALAZINE HCL 50 MG PO TABS: 50.0000 mg | ORAL_TABLET | Freq: Four times a day (QID) | ORAL | Status: DC | PRN

## 2021-05-19 MED ORDER — ALBUMIN HUMAN 25 % IV SOLN
25.0000 g | Freq: Once | INTRAVENOUS | Status: AC
  Administered 2021-05-20: 25 g via INTRAVENOUS
  Filled 2021-05-19: qty 100

## 2021-05-19 MED ORDER — LACTATED RINGERS IV BOLUS
250.0000 mL | Freq: Once | INTRAVENOUS | Status: AC
  Administered 2021-05-20: 250 mL via INTRAVENOUS

## 2021-05-19 MED ORDER — FLUCONAZOLE 100 MG PO TABS
200.0000 mg | ORAL_TABLET | Freq: Every day | ORAL | Status: AC
  Administered 2021-05-19 – 2021-05-21 (×2): 200 mg via ORAL
  Filled 2021-05-19 (×3): qty 2

## 2021-05-19 NOTE — Progress Notes (Addendum)
? ?  [facility]quel [facility]  RCV:[id number] DOB: July 27, 1955 ? ?Subjective: Nurse reports patient with respiratory rate in the 30s with use of accessory muscles.  Instucted to reinitiate BIPAP ? ?Objective: ?Vitals:  ? 05/19/21 2005 05/19/21 2034  ?BP: 105/67   ?Pulse: (!) 114   ?Resp: 17   ?Temp: 98.4 ?F (36.9 ?C)   ?SpO2: 99% 99%  ? ? ? ?Assessment: Use of accessory muscle relieved.  Tidal volumes with BIPAP 50% 14/8 with low tidal volumes ? ? ?Plan: IPAP increase to 16, tidal volumes improved 400-430 ? ?2355 pm -Patient now hypotensive and tachycardic, afebrile - significant elevation of creatinine on this am labs. Albumin and fluids ordered ? Patient was transferred to stepdown for nursing confort/acuity needs.  ?Lasix was discontinued. Pressures stable with continuous iv fluids now at 100 ?Lactic acid was checked and remained unchanged at 2.4. White count improving ?

## 2021-05-19 NOTE — TOC Progression Note (Signed)
Transition of Care (TOC) - Progression Note  ? ? ?Patient Details  ?Name: Lori Petersen [facility] ?MRN: [id number] ?Date of Birth: 08-16-1955 ? ?Transition of Care (TOC) CM/SW Contact  ?Gildardo Griffes, LCSW ?Phone Number: ?05/19/2021, 12:40 PM ? ?Clinical Narrative:    ? ?CSW relayed to Sherri with [facility] that patient's procedure will need to be postponed and that patient will potentially dc tomorrow back to [facility]. CSW has also updated Tammy with [facility].  ? ?Sherri reports she is off today and tomorrow, however to call [facility] main office at [phone] and lvm for Robin Pritts the nurse to discuss transport if dc tomorrow, CSW has done so.  ? ?Murlean Caller called back and reports to call her cell at [phone] when patient is ready to be picked up by [facility] and they will transport back to [facility].  ? ? ? ?Expected Discharge Plan: Skilled Nursing Facility ?Barriers to Discharge: Continued Medical Work up ? ?Expected Discharge Plan and Services ?Expected Discharge Plan: Skilled Nursing Facility ?  ?  ?  ?Living arrangements for the past 2 months: Skilled Nursing Facility ?                ?  ?  ?  ?  ?  ?  ?  ?  ?  ?  ? ? ?Social Determinants of Health (SDOH) Interventions ?  ? ?Readmission Risk Interventions ?   ? View : No data to display.  ?  ?  ?  ? ? ?

## 2021-05-19 NOTE — Progress Notes (Addendum)
Speech Language Pathology Treatment: Dysphagia  ?Patient Details ?Name: [facility] ?MRN: [id number] ?DOB: May 16, 1955 ?Today's Date: 05/19/2021 ?Time: 1517-6160 ?SLP Time Calculation (min) (ACUTE ONLY): 40 min ? ?Assessment / Plan / Recommendation ?Clinical Impression ? Pt seen today for f/u post MBSS yesterday. Pt exhibited Oral phase dysphagia during the MBSS w/ Impulsive drinking behaviors. Suspect the Oral phase dysphagia and need for Supervision during oral intake is in the setting of declined Cognitive status; Baseline Down Syndrome and Dementia. This can impact her overall awareness/timing of swallow and safety during po tasks which increases risk for aspiration, choking.  Pt's risk for aspiration can be reduced when following general aspiration precautions, having Supervision during ALL meals to monitor for Impulsive eating/drinking behaviors(ie: Pinch straw to limit volume, Small single bites), and using a modified diet consistency w/ gravies/condiments to moisten foods for ease of mashing/gumming. ?  ?Presented to pt's room to position her Upright and midline in bed but was called away (~12:45pm). Noted at that time the pt's breathing was tight and min congested in appearance. RT was near and stated she had recently given a breathing tx. NSG aware.  ?  ?Upon returning to pt's room later, she appeared to have fed self 2 food items (magic cup, pureed fruit) upon entering room. This Clinician then assisted pt in consuming trials of purees, MINCED solids and thin liquids w/ PINCHED straw to limit volume at one time. NO overt, clinical s/s of aspiration noted w/ consistencies. Noted no significant change in her respiratory presentation during/post trials -- Rest Breaks given b/t trials to support WOB w/ the energy of po tasks required. No cough noted. Oral phase was adequate for bolus management and oral clearing of the puree boluses; min+ increased time for management and mashing of Minced food texture -- this  increases the work effort. PINCHED straw was used to limit impulsive drinking and reduce risk for aspiration. Mod+ support, cues, and guidance d/t Cognitive status -- she helped to hold own Cup during drinking which improves safety of swallowing.   ?  ?D/t pt's Baseline, declined Cognitive status, Edentulous status, declined Pulmonary status currently, and her risk for aspiration, recommend downgrade to a dysphagia level 1(PUREE - for conservation of energy) w/ thin liquids diet; aspiration precautions; reduce Distractions during meals and engage pt during po's at meal for self-feeding. Monitor drinking behavior and PINCH straw to limit volume as needed during drinking. Pills Crushed in Puree for safer swallowing. Support w/ feeding at meals as needed. Give REST BREAKS during meals monitoring respiratory status. MD/NSG updated. ST services recommends follow w/ Palliative Care for GOC and education re: impact of Cognitive decline/Dementia and other chronic issues on swallowing. ST services can follow pt at discharge at her SNF for further education w/ Staff as needed -- pt appears at/close to her Baseline from a dysphagia standpoint but benefits now from a Puree diet consistency for conservation of energy. Largely suspect that pt's Baseline issues could impact upgrade of diet back to thin liquids. Precautions posted in room ?  ?Recommended further management of GERD w/ a PPI and REFLUX precautions at the SNF; especially NOT to lie down post meals and to eat frequent, small meals. F/u w/ GI for further assessment, management of Reflux can be sought. Pt does have a dx of Hiatal Hernia per chart notes also. ANY Dysmotility or Regurgitation of Reflux material can increase risk for aspiration of the Reflux material during Retrograde flow thus impact Pulmonary status. ? ? ? ?  ?  HPI HPI: Pt  is a 66 y.o. female Patient is a 66 years old female who resides at SNF with past medical history of Down syndrome, Dementia, GERD,  hiatal hernia, heart failure, hypertension, dementia, hyperlipidemia presented to hospital with complaints of cough and shortness of breath.  Of note, patient was recently seen by her primary care physician as outpatient.  Initially patient had presented with some stuffy runny nose subsequently she had increasing shortness of breath.  She was noted to be hypoxic and EMS was called in.  Patient required nonrebreather mask in route to the hospital.  History is extremely limited due to patient's underlying dementia and Down syndrome and family not available.  ED provider had spoken with the PCP .  Patient has been followed by [facility].   No prior history of COPD.     In the ED, patient was tachycardic, tachypneic with a temperature of 100.2 ?F, lactate was elevated at 2.4 and had leukocytosis at 13.6.  Creatinine mildly elevated at 1.4.  She was in respiratory distress and was on nonrebreather mask initially but due to  had increased work of breathing with diffuse wheezing, she was subsequently placed on BiPAP.  EKG showed sinus tachycardia.  Patient was then considered for admission to the hospital for possible sepsis secondary to pneumonia with hypoxic respiratory failure.  Chest CT: "Lungs/Pleura: No pleural effusion or pneumothorax. Limited  assessment of the lung parenchyma due to respiratory motion  artifact. Small areas of consolidation and clustered nodular  opacities in the posterior right upper lobe, right lower lobe, and  right middle lobe. Mild dependent opacity in the left lower lobe, likely atelectasis.". ?  ?   ?SLP Plan ? Continue with current plan of care ? ?  ?  ?Recommendations for follow up therapy are one component of a multi-disciplinary discharge planning process, led by the attending physician.  Recommendations may be updated based on patient status, additional functional criteria and insurance authorization. ?  ? ?Recommendations  ?Diet recommendations: Dysphagia 1 (puree);Thin  liquid ?Liquids provided via: Cup;Straw (pinch straw to limit bolus volume/impulsive drinking) ?Medication Administration: Crushed with puree ?Supervision: Patient able to self feed;Staff to assist with self feeding;Full supervision/cueing for compensatory strategies ?Compensations: Minimize environmental distractions;Slow rate;Small sips/bites;Lingual sweep for clearance of pocketing;Multiple dry swallows after each bite/sip;Follow solids with liquid (Pinch Straw as needed during drinking) ?Postural Changes and/or Swallow Maneuvers: Out of bed for meals;Seated upright 90 degrees;Upright 30-60 min after meal  ?   ?    ?   ? ? ? ? General recommendations:  (Dietician f/u; Palliative Care f/u for GOC) ?Oral Care Recommendations: Oral care BID;Oral care before and after PO;Staff/trained caregiver to provide oral care ?Follow Up Recommendations: Follow physician's recommendations for discharge plan and follow up therapies (at SNF) ?Assistance recommended at discharge: Frequent or constant Supervision/Assistance ?SLP Visit Diagnosis: Dysphagia, oral phase (R13.11) (baseline Down Syndrome and Dementia; GERD) ?Plan: Continue with current plan of care ? ? ? ? ?  ?  ? ? ? ?Jerilynn Som, MS, CCC-SLP ?Speech Language Pathologist ?Rehab Services; [facility] ?[phone] (ascom) ? ?Patton Rabinovich ? ?05/19/2021, 4:02 PM ?

## 2021-05-19 NOTE — Progress Notes (Incomplete Revision)
? ?  [id number] DOB: 10/02/1955 ? ?Subjective: Nurse reports patient with respiratory rate in the 30s with use of accessory muscles.  Instucted to reinitiate BIPAP ? ?Objective: ?Vitals:  ? 05/19/21 2005 05/19/21 2034  ?BP: 105/67   ?Pulse: (!) 114   ?Resp: 17   ?Temp: 98.4 ?F (36.9 ?C)   ?SpO2: 99% 99%  ? ? ? ?Assessment: Use of accessory muscle relieved.  Tidal volumes with BIPAP 50% 14/8 with low tidal volumes ? ? ?Plan: IPAP increase to 16, tidal volumes improved 400-430 ? ?2355 pm -Patient now hypotensive and tachycardic, afebrile - significant elevation of creatinine on this am labs. Albumin and fluids ordered ?  ?

## 2021-05-19 NOTE — NC FL2 (Signed)
?[place] MEDICAID FL2 LEVEL OF CARE SCREENING TOOL  ?  ? ?IDENTIFICATION  ?Patient Name: ?Lori Petersen Birthdate: 10-19-1955 Sex: female Admission Date (Current Location): ?05/17/2021  ?[place] Number: ? [place] ?  Facility and Address:  ?[facility], [address], [place], [place] [address] ?     Provider Number: ?[id number]  ?Attending Physician Name and Address:  ?Charise Killian, MD ? Relative Name and Phone Number:  ?Nicole Cella (guardian) [phone] ?   ?Current Level of Care: ?Hospital Recommended Level of Care: ?Skilled Nursing Facility Prior Approval Number: ?  ? ?Date Approved/Denied: ?  PASRR Number: ?[id number] B ? ?Discharge Plan: ?SNF ?  ? ?Current Diagnoses: ?Patient Active Problem List  ? Diagnosis Date Noted  ? Respiratory failure (HCC) 05/18/2021  ? Pneumonia 05/17/2021  ? Dementia (HCC)   ? HLD (hyperlipidemia)   ? Sepsis (HCC)   ? Hypernatremia 03/26/2021  ? Thrombocytopenia (HCC) 03/26/2021  ? Asthma, mild intermittent 03/25/2021  ? Nausea & vomiting 03/25/2021  ? Bacteremia due to Escherichia coli 03/25/2021  ? Complicated UTI (urinary tract infection) 03/25/2021  ? Down syndrome 03/25/2021  ? AKI (acute kidney injury) (HCC) 03/24/2021  ? Septic shock (HCC)   ? Left ureteral stone   ? Seizures (HCC) 12/03/2018  ? Obesity, Class III, 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  ? Chronic diastolic CHF (congestive heart failure) (HCC) 11/06/2018  ? Acute metabolic encephalopathy 11/06/2018  ? Hypothyroid 11/06/2018  ? COVID-19 virus infection 11/03/2018  ? COVID-19   ? Acute respiratory failure with hypoxia (HCC) 11/02/2018  ? Left tibial fracture 04/27/2017  ? Right tibial fracture 04/27/2017  ? Problems with swallowing and mastication   ? Blood in stool   ? Bradycardia 05/14/2015  ? 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 Onychomycosis   ? ? ?Orientation RESPIRATION BLADDER Height & Weight   ?  ?Self ? O2 (4L nasal cannula)  Incontinent, External catheter Weight: 215 lb 2.7 oz (97.6 kg) ?Height:  5\' 8"  (172.7 cm)  ?BEHAVIORAL SYMPTOMS/MOOD NEUROLOGICAL BOWEL NUTRITION STATUS  ?    Incontinent Diet (see discharge summary)  ?AMBULATORY STATUS COMMUNICATION OF NEEDS Skin   ?Extensive Assist Verbally Normal ?  ?  ?  ?    ?     ?     ? ? ?Personal Care Assistance Level of Assistance  ?Bathing, Dressing, Total care, Feeding Bathing Assistance: Maximum assistance ?Feeding assistance: Maximum assistance ?Dressing Assistance: Maximum assistance ?Total Care Assistance: Maximum assistance  ? ?Functional Limitations Info  ?Sight, Hearing, Speech Sight Info: Adequate ?Hearing Info: Adequate ?Speech Info: Adequate  ? ? ?SPECIAL CARE FACTORS FREQUENCY  ?PT (By licensed PT), OT (By licensed OT)   ?  ?  ?  ?  ?  ?  ?   ? ? ?Contractures Contractures Info: Not present  ? ? ?Additional Factors Info  ?Code Status, Allergies Code Status Info: full ?Allergies Info: no known allergies ?  ?  ?  ?   ? ?Current Medications (05/19/2021):  This is the current hospital active medication list ?Current Facility-Administered Medications  ?Medication Dose Route Frequency Provider Last Rate Last Admin  ? acetaminophen (TYLENOL) tablet 650 mg  650 mg Oral Q6H PRN Pokhrel, Laxman, MD      ? Or  ? acetaminophen (TYLENOL) suppository 650 mg  650 mg Rectal Q6H PRN Pokhrel, Laxman, MD      ? aspirin EC tablet 81 mg  81 mg Oral Daily Pokhrel, Laxman, MD  81 mg at 05/19/21 1029  ? ceFEPIme (MAXIPIME) 2 g in sodium chloride 0.9 % 100 mL IVPB  2 g Intravenous Q8H Charise Killian, MD 200 mL/hr at 05/19/21 0617 2 g at 05/19/21 0617  ? cholecalciferol (VITAMIN D3) tablet 2,000 Units  2,000 Units Oral Daily Pokhrel, Laxman, MD   2,000 Units at 05/19/21 1029  ? guaiFENesin-dextromethorphan (ROBITUSSIN DM) 100-10 MG/5ML syrup 5 mL  5 mL Oral Q4H PRN Charise Killian, MD   5 mL at 05/18/21 1839  ? heparin injection 5,000 Units  5,000 Units Subcutaneous Q8H Pokhrel, Laxman, MD    5,000 Units at 05/19/21 0516  ? hydrALAZINE (APRESOLINE) tablet 50 mg  50 mg Oral Q6H PRN Charise Killian, MD      ? ipratropium-albuterol (DUONEB) 0.5-2.5 (3) MG/3ML nebulizer solution 3 mL  3 mL Nebulization Q4H Charise Killian, MD   3 mL at 05/19/21 1143  ? latanoprost (XALATAN) 0.005 % ophthalmic solution 1 drop  1 drop Left Eye QHS Pokhrel, Laxman, MD   1 drop at 05/18/21 2223  ? levETIRAcetam (KEPPRA) tablet 250 mg  250 mg Oral BID Pokhrel, Laxman, MD   250 mg at 05/19/21 1039  ? levETIRAcetam (KEPPRA) tablet 500 mg  500 mg Oral BID Pokhrel, Laxman, MD   500 mg at 05/19/21 1029  ? levothyroxine (SYNTHROID) tablet 100 mcg  100 mcg Oral QAC breakfast Pokhrel, Laxman, MD   100 mcg at 05/19/21 0516  ? LORazepam (ATIVAN) injection 1 mg  1 mg Intravenous Q1H PRN Charise Killian, MD      ? mometasone-formoterol [facility]) 100-5 MCG/ACT inhaler 2 puff  2 puff Inhalation BID Joycelyn Das, MD   2 puff at 05/19/21 1037  ? ondansetron (ZOFRAN) tablet 4 mg  4 mg Oral Q6H PRN Pokhrel, Laxman, MD      ? Or  ? ondansetron (ZOFRAN) injection 4 mg  4 mg Intravenous Q6H PRN Pokhrel, Laxman, MD      ? pantoprazole (PROTONIX) EC tablet 40 mg  40 mg Oral Daily Pokhrel, Laxman, MD   40 mg at 05/19/21 1029  ? pravastatin (PRAVACHOL) tablet 40 mg  40 mg Oral QHS Pokhrel, Laxman, MD   40 mg at 05/18/21 2223  ? senna (SENOKOT) tablet 17.2 mg  2 tablet Oral Daily Pokhrel, Laxman, MD   17.2 mg at 05/19/21 1029  ? sodium chloride flush (NS) 0.9 % injection 3 mL  3 mL Intravenous Q12H Pokhrel, Laxman, MD   3 mL at 05/19/21 1046  ? [START ON 05/20/2021] vancomycin (VANCOREADY) IVPB 1250 mg/250 mL  1,250 mg Intravenous Q48H Charise Killian, MD      ? vitamin B-12 (CYANOCOBALAMIN) tablet 1,000 mcg  1,000 mcg Oral Daily Pokhrel, Laxman, MD   1,000 mcg at 05/19/21 1029  ? ? ? ?Discharge Medications: ?Please see discharge summary for a list of discharge medications. ? ?Relevant Imaging Results: ? ?Relevant Lab  Results: ? ? ?Additional Information ?SSN:[id number] ? ?Gildardo Griffes, LCSW ? ? ? ? ?

## 2021-05-19 NOTE — Progress Notes (Signed)
Respiratory at bedside with Pt post breathing treatment. PT is experiencing shortness of breath with accessory muscle use. NP Manuela Schwartz notified of pt current status and recommending continuous bipap at this time. ?

## 2021-05-19 NOTE — Progress Notes (Signed)
?PROGRESS NOTE ? ? ? ?[facility]  ZOX:[id number] DOB: 04/15/1955 DOA: 05/17/2021 ?PCP: [facility] [place]  ? ? ?Assessment & Plan: ?  ?Principal Problem: ?  Acute respiratory failure with hypoxia ([place]) ?Active Problems: ?  Chronic diastolic CHF (congestive heart failure) ([place]) ?  Hypothyroid ?  Mixed Alzheimer's and vascular dementia ([place]) ?  Obesity, Class III, BMI 40-49.9 (morbid obesity) ([place]) ?  Pneumonia ?  Dementia ([place]) ?  HLD (hyperlipidemia) ?  Sepsis ([place]) ?  Respiratory failure ([place] [facility]) ? ?Assessment and Plan: ? ?  ?Acute hypoxic respiratory failure: initially required BIPAP but has since been weaned off. Continue on IV cefepime,vanco and encourage incentive spirometry. Continue on supplemental oxygen and wean as tolerated  ?  ?Mild metabolic encephalopathy: likely secondary to pneumonia. Has underlying dementia & Down syndrome. Continue w/ supportive care ?  ?Sepsis: met criteria w/ tachycardia, tachypnea, fever, leukocytosis & secondary to multifocal pneumonia & UTI. Continue on IV cefepime, vanco. Sepsis resolved  ? ?AKI: baseline Cr/GFR is unknown. Cr is trending up from day prior. ? ?UTI: w/ hx left ureteral stent in the past with distal left ureteral stone with persistent mild left hydroureteronephrosis. Urine cx is growing yeast. Will start fluconazole. Scheduled on 05/20/21 for urethral stone removal outpatient but this will need to be rescheduled  ? ?Unlikely bacteremia: blood cxs growing staph species, likely containment. Repeat blood cxs NGTD  ?  ?Dementia: w/ hx of down's syndrome. Poor historian. Continue on home dose of keppra ?  ?Likely chronic CHF: echo from 03/25/2021 showed EF 55 to 60% with no regional wall motion abnormality. ?  ?HLD: continue on statin  ?  ?Hypothyroidism: continue on home dose of synthroid  ? ?Thrombocytopenia: resolved  ? ? ? ? ?DVT prophylaxis: heparin  ?Code Status: full  ?Family Communication: ?Disposition Plan: likely d/c back to [facility]  ? ?Level of  care: Progressive ? ?Status is: Inpatient ?Remains inpatient appropriate because: severity of illness ? ? ?Consultants:  ? ? ?Procedures: ? ?Antimicrobials: cefepime, vanco  ? ? ?Subjective: ?Pt is lethargic.  ? ?Objective: ?Vitals:  ? 05/19/21 0535 05/19/21 0745 05/19/21 1143 05/19/21 1202  ?BP: (!) 117/100 (!) 119/104  (!) 111/57  ?Pulse: 97 99  84  ?Resp: 20 18  19   ?Temp: 98.6 ?F (37 ?C) 97.8 ?F (36.6 ?C)  98.1 ?F (36.7 ?C)  ?TempSrc: Oral     ?SpO2: 100% 99% 93%   ?Weight: 97.6 kg     ?Height:      ? ? ?Intake/Output Summary (Last 24 hours) at 05/19/2021 1242 ?Last data filed at 05/19/2021 1100 ?Gross per 24 hour  ?Intake 538.98 ml  ?Output --  ?Net 538.98 ml  ? ?Filed Weights  ? 05/17/21 1102 05/18/21 1802 05/19/21 0535  ?Weight: 99.2 kg 97.1 kg 97.6 kg  ? ? ?Examination: ? ?General exam: Appears lethargic   ?Respiratory system: diminished breath sounds b/l  ?Cardiovascular system: S1/S2+. No rubs or clicks  ?Gastrointestinal system: Abd is soft, NT, obese & hypoactive bowel sounds ?Central nervous system: lethargic. Moves all extremities  ?Psychiatry: Judgement and insight appears poor. Flat mood and affect  ? ? ? ?Data Reviewed: I have personally reviewed following labs and imaging studies ? ?CBC: ?Recent Labs  ?Lab 05/17/21 ?1110 05/18/21 ?8119 05/19/21 ?1011  ?WBC 10.6* 8.3 15.5*  ?NEUTROABS 8.5*  --   --   ?HGB 12.5 10.9* 11.5*  ?HCT 42.5 38.5 38.0  ?MCV 100.2* 106.4* 99.2  ?PLT 221 138* 168  ? ?  Basic Metabolic Panel: ?Recent Labs  ?Lab 05/17/21 ?1110 05/18/21 ?3845 05/19/21 ?1011  ?NA 141 144 142  ?K 3.8 3.5 4.3  ?CL 102 116* 109  ?CO2 27 18* 23  ?GLUCOSE 163* 122* 131*  ?BUN 14 15 21   ?CREATININE 1.45* 0.87 1.33*  ?CALCIUM 8.6* 6.9* 8.0*  ?MG  --  1.9  --   ? ?GFR: ?Estimated Creatinine Clearance: 51.5 mL/min (A) (by C-G formula based on SCr of 1.33 mg/dL (H)). ?Liver Function Tests: ?Recent Labs  ?Lab 05/17/21 ?1110 05/18/21 ?0540  ?AST 18 17  ?ALT 9 6  ?ALKPHOS 46 33*  ?BILITOT 0.6 0.4  ?PROT 7.7 6.6   ?ALBUMIN 3.1* 2.6*  ? ?No results for input(s): LIPASE, AMYLASE in the last 168 hours. ?No results for input(s): AMMONIA in the last 168 hours. ?Coagulation Profile: ?Recent Labs  ?Lab 05/17/21 ?1110  ?INR 1.1  ? ?Cardiac Enzymes: ?No results for input(s): CKTOTAL, CKMB, CKMBINDEX, TROPONINI in the last 168 hours. ?BNP (last 3 results) ?No results for input(s): PROBNP in the last 8760 hours. ?HbA1C: ?Recent Labs  ?  05/17/21 ?1308  ?HGBA1C 5.9*  ? ?CBG: ?Recent Labs  ?Lab 05/19/21 ?3646  ?GLUCAP 142*  ? ?Lipid Profile: ?No results for input(s): CHOL, HDL, LDLCALC, TRIG, CHOLHDL, LDLDIRECT in the last 72 hours. ?Thyroid Function Tests: ?Recent Labs  ?  05/17/21 ?1308  ?TSH 1.069  ? ?Anemia Panel: ?No results for input(s): VITAMINB12, FOLATE, FERRITIN, TIBC, IRON, RETICCTPCT in the last 72 hours. ?Sepsis Labs: ?Recent Labs  ?Lab 05/17/21 ?1110 05/17/21 ?1308  ?LATICACIDVEN 2.4* 1.6  ? ? ?Recent Results (from the past 240 hour(s))  ?Blood Culture (routine x 2)     Status: Abnormal (Preliminary result)  ? Collection Time: 05/17/21 11:10 AM  ? Specimen: BLOOD  ?Result Value Ref Range Status  ? Specimen Description   Final  ?  BLOOD RIGHT ANTECUBITAL ?Performed at [facility], [address], [place], [place] [address] ?  ? Special Requests   Final  ?  BOTTLES DRAWN AEROBIC AND ANAEROBIC Blood Culture adequate volume ?Performed at [facility], [address] [place], [place] [address] ?  ? Culture  Setup Time   Final  ?  GRAM POSITIVE COCCI ?AEROBIC BOTTLE ONLY ?Organism ID to follow ?CRITICAL RESULT CALLED TO, READ BACK BY AND VERIFIED WITH: MORGAN HICKS 05/18/21 1036 MW ?  ? Culture (A)  Final  ?  STAPHYLOCOCCUS CAPITIS ?THE SIGNIFICANCE OF ISOLATING THIS ORGANISM FROM A SINGLE SET OF BLOOD CULTURES WHEN MULTIPLE SETS ARE DRAWN IS UNCERTAIN. PLEASE NOTIFY THE MICROBIOLOGY DEPARTMENT WITHIN ONE WEEK IF SPECIATION AND SENSITIVITIES ARE REQUIRED. ?Performed at [facility], [place] [address]  [place], [place] [address] ?  ? Report Status PENDING  Incomplete  ?Blood Culture (routine x 2)     Status: None (Preliminary result)  ? Collection Time: 05/17/21 11:10 AM  ? Specimen: BLOOD  ?Result Value Ref Range Status  ? Specimen Description BLOOD RIGHT FA  Final  ? Special Requests   Final  ?  BOTTLES DRAWN AEROBIC AND ANAEROBIC Blood Culture results may not be optimal due to an inadequate volume of blood received in culture bottles  ? Culture   Final  ?  NO GROWTH 2 DAYS ?Performed at [facility], [address], [place],  [address] ?  ? Report Status PENDING  Incomplete  ?Resp Panel by RT-PCR (Flu A&B, Covid) Nasopharyngeal Swab     Status: None  ? Collection Time:  05/17/21 11:10 AM  ? Specimen: Nasopharyngeal Swab; Nasopharyngeal(NP) swabs in vial transport medium  ?Result Value Ref Range Status  ? SARS Coronavirus 2 by RT PCR NEGATIVE NEGATIVE Final  ?  Comment: (NOTE) ?SARS-CoV-2 target nucleic acids are NOT DETECTED. ? ?The SARS-CoV-2 RNA is generally detectable in upper respiratory ?specimens during the acute phase of infection. The lowest ?concentration of SARS-CoV-2 viral copies this assay can detect is ?138 copies/mL. A negative result does not preclude SARS-Cov-2 ?infection and should not be used as the sole basis for treatment or ?other patient management decisions. A negative result may occur with  ?improper specimen collection/handling, submission of specimen other ?than nasopharyngeal swab, presence of viral mutation(s) within the ?areas targeted by this assay, and inadequate number of viral ?copies(<138 copies/mL). A negative result must be combined with ?clinical observations, patient history, and epidemiological ?information. The expected result is Negative. ? ?Fact Sheet for Patients:  ?EntrepreneurPulse.com.au ? ?Fact Sheet for Healthcare Providers:  ?IncredibleEmployment.be ? ?This test is no t yet approved or cleared by the [place] FDA  and  ?has been authorized for detection and/or diagnosis of SARS-CoV-2 by ?FDA under an Emergency Use Authorization (EUA). This EUA will remain  ?in effect (meaning this test can be used) for the duration

## 2021-05-19 NOTE — Plan of Care (Signed)
?  Problem: Respiratory: ?Goal: Ability to maintain adequate ventilation will improve ?Outcome: Progressing ?Goal: Ability to maintain a clear airway will improve ?Outcome: Progressing ?  ?

## 2021-05-20 ENCOUNTER — Inpatient Hospital Stay: Payer: Medicare (Managed Care)

## 2021-05-20 DIAGNOSIS — N179 Acute kidney failure, unspecified: Secondary | ICD-10-CM | POA: Diagnosis not present

## 2021-05-20 DIAGNOSIS — F0390 Unspecified dementia without behavioral disturbance: Secondary | ICD-10-CM | POA: Diagnosis not present

## 2021-05-20 DIAGNOSIS — J9601 Acute respiratory failure with hypoxia: Secondary | ICD-10-CM | POA: Diagnosis not present

## 2021-05-20 LAB — LACTIC ACID, PLASMA
Lactic Acid, Venous: 2.4 mmol/L (ref 0.5–1.9)
Lactic Acid, Venous: 1.7 mmol/L (ref 0.5–1.9)

## 2021-05-20 LAB — BASIC METABOLIC PANEL
Anion gap: 11 (ref 5–15)
BUN: 27 mg/dL — ABNORMAL HIGH (ref 8–23)
CO2: 21 mmol/L — ABNORMAL LOW (ref 22–32)
Calcium: 7.9 mg/dL — ABNORMAL LOW (ref 8.9–10.3)
Chloride: 110 mmol/L (ref 98–111)
Creatinine, Ser: 1.64 mg/dL — ABNORMAL HIGH (ref 0.44–1.00)
GFR, Estimated: 35 mL/min — ABNORMAL LOW (ref >60)
Glucose, Bld: 183 mg/dL — ABNORMAL HIGH (ref 70–99)
Potassium: 3.8 mmol/L (ref 3.5–5.1)
Sodium: 142 mmol/L (ref 135–145)

## 2021-05-20 LAB — CBC
HCT: 36.6 % (ref 36.0–46.0)
Hemoglobin: 10.7 g/dL — ABNORMAL LOW (ref 12.0–15.0)
MCH: 29.2 pg (ref 26.0–34.0)
MCHC: 29.2 g/dL — ABNORMAL LOW (ref 30.0–36.0)
MCV: 99.7 fL (ref 80.0–100.0)
Platelets: 100 10*3/uL — ABNORMAL LOW (ref 150–400)
RBC: 3.67 MIL/uL — ABNORMAL LOW (ref 3.87–5.11)
RDW: 16.8 % — ABNORMAL HIGH (ref 11.5–15.5)
WBC: 14 10*3/uL — ABNORMAL HIGH (ref 4.0–10.5)
nRBC: 0 % (ref 0.0–0.2)

## 2021-05-20 LAB — CULTURE, BLOOD (ROUTINE X 2): Special Requests: ADEQUATE

## 2021-05-20 LAB — GLUCOSE, CAPILLARY
Glucose-Capillary: 163 mg/dL — ABNORMAL HIGH (ref 70–99)
Glucose-Capillary: 107 mg/dL — ABNORMAL HIGH (ref 70–99)

## 2021-05-20 MED ORDER — SODIUM CHLORIDE 0.9 % IV SOLN
500.0000 mg | INTRAVENOUS | Status: DC
  Administered 2021-05-20 – 2021-05-24 (×5): 500 mg via INTRAVENOUS
  Filled 2021-05-20 (×5): qty 5
  Filled 2021-05-20: qty 500

## 2021-05-20 MED ORDER — SODIUM CHLORIDE 0.9 % IV SOLN
750.0000 mg | Freq: Two times a day (BID) | INTRAVENOUS | Status: DC
  Administered 2021-05-20 – 2021-05-23 (×3): 750 mg via INTRAVENOUS
  Filled 2021-05-20 (×12): qty 7.5

## 2021-05-20 MED ORDER — LACTATED RINGERS IV BOLUS
500.0000 mL | Freq: Once | INTRAVENOUS | Status: AC
  Administered 2021-05-20: 500 mL via INTRAVENOUS

## 2021-05-20 MED ORDER — LACTATED RINGERS IV SOLN: INTRAVENOUS | Status: DC

## 2021-05-20 MED ORDER — SODIUM CHLORIDE 0.9 % IV SOLN
2.0000 g | INTRAVENOUS | Status: DC
  Administered 2021-05-20 – 2021-05-21 (×2): 2 g via INTRAVENOUS
  Filled 2021-05-20: qty 2
  Filled 2021-05-20: qty 20

## 2021-05-20 MED ORDER — LEVETIRACETAM 750 MG PO TABS
750.0000 mg | ORAL_TABLET | Freq: Two times a day (BID) | ORAL | Status: DC
  Administered 2021-05-21 – 2021-05-30 (×18): 750 mg via ORAL
  Filled 2021-05-20 (×21): qty 1

## 2021-05-20 MED ORDER — SODIUM CHLORIDE 0.9 % IV SOLN: 2.0000 g | Freq: Two times a day (BID) | INTRAVENOUS | Status: DC

## 2021-05-20 MED ORDER — VANCOMYCIN VARIABLE DOSE PER UNSTABLE RENAL FUNCTION (PHARMACIST DOSING): Status: DC

## 2021-05-20 MED ORDER — SODIUM CHLORIDE 0.9 % IV SOLN: INTRAVENOUS | Status: DC | PRN

## 2021-05-20 NOTE — Progress Notes (Signed)
Transported pt to ICU on bipap. No issues ?

## 2021-05-20 NOTE — Plan of Care (Signed)
Pt transferred to ICU from 2A ~0300 for hypotension and AMS. Albumin and 558ml LR bolus completed from previous unit. LR @100ml /hr started per NP Randol Kern. BP unreliable on BUE; able to get consistent readings on BLE. Upon arrival to ICU SBP 70s-80s and imrpoved to 90s after bolus/initiating maintenance fluids. MAP remains >65. Pt tolerating 40% BiPAP well. Pt unable to follow commands/respond to questions. Mental status labile. Pt not arousable to voice but arousable to touch/pain yet occasionally spontaneously alert. Afebrile.  ?Problem: Education: ?Goal: Knowledge of disease or condition will improve ?Outcome: Progressing ?Goal: Knowledge of the prescribed therapeutic regimen will improve ?Outcome: Progressing ?Goal: Individualized Educational Video(s) ?Outcome: Progressing ?  ?Problem: Activity: ?Goal: Ability to tolerate increased activity will improve ?Outcome: Progressing ?Goal: Will verbalize the importance of balancing activity with adequate rest periods ?Outcome: Progressing ?  ?Problem: Respiratory: ?Goal: Ability to maintain a clear airway will improve ?Outcome: Progressing ?Goal: Levels of oxygenation will improve ?Outcome: Progressing ?Goal: Ability to maintain adequate ventilation will improve ?Outcome: Progressing ?  ?Problem: Activity: ?Goal: Ability to tolerate increased activity will improve ?Outcome: Progressing ?  ?Problem: Clinical Measurements: ?Goal: Ability to maintain a body temperature in the normal range will improve ?Outcome: Progressing ?  ?Problem: Respiratory: ?Goal: Ability to maintain adequate ventilation will improve ?Outcome: Progressing ?Goal: Ability to maintain a clear airway will improve ?Outcome: Progressing ?  ?

## 2021-05-20 NOTE — Progress Notes (Deleted)
Speech Language Pathology Treatment: Dysphagia  ?Patient Details ?Name: Torry Adamczak [facility] ?MRN: [id number] ?DOB: May 01, 1955 ?Today's Date: 05/20/2021 ?Time: 1310-1350 ?SLP Time Calculation (min) (ACUTE ONLY): 40 min ? ?Assessment / Plan / Recommendation ?Clinical Impression ? Pt seen today for f/u post MBSS yesterday. Pt exhibited Oral phase dysphagia during the MBSS w/ Impulsive drinking behaviors. Suspect the Oral phase dysphagia and need for Supervision during oral intake is in the setting of declined Cognitive status; Baseline Down Syndrome and Dementia. This can impact her overall awareness/timing of swallow and safety during po tasks which increases risk for aspiration, choking.  Pt's risk for aspiration can be reduced when following general aspiration precautions, having Supervision during ALL meals to monitor for Impulsive eating/drinking behaviors(ie: Pinch straw to limit volume, Small single bites), and using a modified diet consistency w/ gravies/condiments to moisten foods for ease of mashing/gumming. ?  ?Presented to pt's room to position her Upright and midline in bed but was called away (~12:45pm). Noted at that time the pt's breathing was tight and min congested in appearance. RT was near and stated she had recently given a breathing tx. NSG aware.  ?  ?Upon returning to pt's room later, she appeared to have fed self 2 food items (magic cup, pureed fruit) upon entering room. This Clinician then assisted pt in consuming trials of purees, MINCED solids and thin liquids w/ PINCHED straw to limit volume at one time. NO overt, clinical s/s of aspiration noted w/ consistencies. Noted no significant change in her respiratory presentation during/post trials -- Rest Breaks given b/t trials to support WOB w/ the energy of po tasks required. No cough noted. Oral phase was adequate for bolus management and oral clearing of the puree boluses; min+ increased time for management and mashing of Minced food texture -- this  increases the work effort. PINCHED straw was used to limit impulsive drinking and reduce risk for aspiration. Mod+ support, cues, and guidance d/t Cognitive status -- she helped to hold own Cup during drinking which improves safety of swallowing.   ?  ?D/t pt's Baseline, declined Cognitive status, Edentulous status, declined Pulmonary status currently, and her risk for aspiration, recommend downgrade to a dysphagia level 1(PUREE - for conservation of energy) w/ thin liquids diet; aspiration precautions; reduce Distractions during meals and engage pt during po's at meal for self-feeding. Monitor drinking behavior and PINCH straw to limit volume as needed during drinking. Pills Crushed in Puree for safer swallowing. Support w/ feeding at meals as needed. Give REST BREAKS during meals monitoring respiratory status. MD/NSG updated. ST services recommends follow w/ Palliative Care for GOC and education re: impact of Cognitive decline/Dementia and other chronic issues on swallowing. ST services can follow pt at discharge at her SNF for further education w/ Staff as needed -- pt appears at/close to her Baseline from a dysphagia standpoint but benefits now from a Puree diet consistency for conservation of energy. Largely suspect that pt's Baseline issues could impact upgrade of diet back to thin liquids. Precautions posted in room ?  ?Recommended further management of GERD w/ a PPI and REFLUX precautions at the SNF; especially NOT to lie down post meals and to eat frequent, small meals. F/u w/ GI for further assessment, management of Reflux can be sought. Pt does have a dx of Hiatal Hernia per chart notes also. ANY Dysmotility or Regurgitation of Reflux material can increase risk for aspiration of the Reflux material during Retrograde flow thus impact Pulmonary status. ? ?  ?HPI  HPI: Pt  is a 66 y.o. female Patient is a 66 years old female who resides at SNF with past medical history of Down syndrome, Dementia, GERD, hiatal  hernia, heart failure, hypertension, dementia, hyperlipidemia presented to hospital with complaints of cough and shortness of breath.  Of note, patient was recently seen by her primary care physician as outpatient.  Initially patient had presented with some stuffy runny nose subsequently she had increasing shortness of breath.  She was noted to be hypoxic and EMS was called in.  Patient required nonrebreather mask in route to the hospital.  History is extremely limited due to patient's underlying dementia and Down syndrome and family not available.  ED provider had spoken with the PCP .  [facility].   No prior history of COPD.     In the ED, patient was tachycardic, tachypneic with a temperature of 100.2 ?F, lactate was elevated at 2.4 and had leukocytosis at 13.6.  Creatinine mildly elevated at 1.4.  She was in respiratory distress and was on nonrebreather mask initially but due to  had increased work of breathing with diffuse wheezing, she was subsequently placed on BiPAP.  EKG showed sinus tachycardia.  Patient was then considered for admission to the hospital for possible sepsis secondary to pneumonia with hypoxic respiratory failure.  Chest CT: "Lungs/Pleura: No pleural effusion or pneumothorax. Limited  assessment of the lung parenchyma due to respiratory motion  artifact. Small areas of consolidation and clustered nodular  opacities in the posterior right upper lobe, right lower lobe, and  right middle lobe. Mild dependent opacity in the left lower lobe, likely atelectasis.". ?  ?   ?SLP Plan ? Continue with current plan of care ? ?  ?  ?Recommendations for follow up therapy are one component of a multi-disciplinary discharge planning process, led by the attending physician.  Recommendations may be updated based on patient status, additional functional criteria and insurance authorization. ?  ? ?Recommendations  ?Diet recommendations: Dysphagia 1 (puree);Thin  liquid ?Liquids provided via: Cup;Straw (Pinched straw during drinking d/t impulsive drinking) ?Medication Administration: Crushed with puree ?Supervision: Staff to assist with self feeding;Full supervision/cueing for compensatory strategies ?Compensations: Minimize environmental distractions;Slow rate;Small sips/bites;Lingual sweep for clearance of pocketing;Multiple dry swallows after each bite/sip;Follow solids with liquid (Pinch Straw as needed during drinking) ?Postural Changes and/or Swallow Maneuvers: Out of bed for meals;Seated upright 90 degrees;Upright 30-60 min after meal  ?   ?    ?   ? ? ? ? General recommendations:  (Dietician f/u; Palliative Care f/u) ?Oral Care Recommendations: Oral care BID;Oral care before and after PO;Staff/trained caregiver to provide oral care ?Follow Up Recommendations: Follow physician's recommendations for discharge plan and follow up therapies (at SNF) ?Assistance recommended at discharge: Frequent or constant Supervision/Assistance ?SLP Visit Diagnosis: Dysphagia, oral phase (R13.11) (baseline Down Syndrome and Dementia; GERD) ?Plan: Continue with current plan of care ? ? ? ? ?  ?  ? ? ?Kaileah Shevchenko ? ?05/20/2021, 5:32 PM ?

## 2021-05-20 NOTE — Progress Notes (Addendum)
Speech Language Pathology Treatment: Dysphagia  ?Patient Details ?Name: Lori Petersen ?MRN: [id number] ?DOB: 06/28/1955 ?Today's Date: 05/20/2021 ?Time: 1310-1350 ?SLP Time Calculation (min) (ACUTE ONLY): 40 min ? ?Assessment / Plan / Recommendation ?Clinical Impression ? Pt seen today for f/u post transfer to CCU yesterday for Acute hypoxic respiratory failure and need for BiPAP. Pt has weaned from BiPAP now on [place] O2 support. Less WOB/SOB noted at rest.  ?Pt exhibited Oral phase dysphagia during the recent MBSS this admit w/ Impulsive drinking behaviors. NO aspiration was noted during the study. Suspect the Oral phase dysphagia and need for Supervision during oral intake is in the setting of declined Cognitive status; Baseline Down Syndrome and Dementia. This can impact her overall awareness/timing of swallow and safety during po tasks which increases risk for aspiration, choking.  Pt's risk for aspiration can be reduced when following general aspiration precautions, having Supervision during ALL meals to monitor for Impulsive eating/drinking behaviors(ie: Pinch straw to limit volume, Small single bites), and using a modified diet consistency w/ gravies/condiments to moisten foods for ease of mashing/gumming. ?  ?This Clinician assisted pt w/ feeding of Puree and thin liquids via PINCHED straw to limit volume at one time. Diet was modified to a Puree consistency d/t overall declined medical and pulmonary status' yesterday; and for ease of oral phase management. NO overt, clinical s/s of aspiration noted w/ consistencies. Noted no significant change in her respiratory presentation during/post trials -- Rest Breaks given b/t trials to support any WOB w/ the energy of po tasks required. No cough noted. O2 sats remained ~98%. Oral phase was c/b increased oral holding and oral phase time for bolus management, A-P transfer, and oral clearing of the puree boluses. Visual and verbal cues given to encourage oral clearing.  PINCHED straw was used to limit impulsive drinking and reduce risk for aspiration. Mod+ support, cues, and guidance d/t Cognitive status.  ? ?D/t pt's Baseline, declined Cognitive status, Edentulous status, declined Pulmonary status currently, and risk for aspiration, recommend continue a dysphagia level 1(PUREE - for conservation of energy) w/ thin liquids diet; aspiration precautions; reduce Distractions during meals and engage pt during po's at meal for self-feeding. Monitor drinking behavior and PINCH straw to limit volume as needed during drinking. Pills Crushed in Puree for safer swallowing. Support w/ feeding at meals as needed. Give REST BREAKS during meals monitoring respiratory status. MD/NSG updated.  ? ?ST services recommends follow w/ Palliative Care for GOC and education re: impact of Cognitive decline/Dementia and other chronic issues on swallowing.  ?ST services can follow pt at discharge at her SNF for further education w/ Staff as needed -- pt appears at/close to her Baseline from a dysphagia standpoint but benefits now from a Puree diet consistency for conservation of energy. Largely suspect that pt's Baseline issues could impact upgrade of diet back to solid foods. Precautions posted in room ? ?Recommended further management of GERD w/ a PPI and REFLUX precautions at the SNF; especially NOT to lie down post meals and to eat frequent, small meals. F/u w/ GI for further assessment, management of Reflux can be sought. Pt does have a dx of Hiatal Hernia per chart notes also. ANY Dysmotility or Regurgitation of Reflux material can increase risk for aspiration of the Reflux material during Retrograde flow thus impact Pulmonary status. ? ?  ?HPI HPI: Pt  is a 66 y.o. female Patient is a 66 years old female who resides at SNF with past medical history of Down  syndrome, Dementia, GERD, hiatal hernia, heart failure, hypertension, dementia, hyperlipidemia presented to hospital with complaints of cough and  shortness of breath.  Of note, patient was recently seen by her primary care physician as outpatient.  Initially patient had presented with some stuffy runny nose subsequently she had increasing shortness of breath.  She was noted to be hypoxic and EMS was called in.  Patient required nonrebreather mask in route to the hospital.  History is extremely limited due to patient's underlying dementia and Down syndrome and family not available.  ED provider had spoken with the PCP .  [facility].   No prior history of COPD.     In the ED, patient was tachycardic, tachypneic with a temperature of 100.2 ?F, lactate was elevated at 2.4 and had leukocytosis at 13.6.  Creatinine mildly elevated at 1.4.  She was in respiratory distress and was on nonrebreather mask initially but due to  had increased work of breathing with diffuse wheezing, she was subsequently placed on BiPAP.  EKG showed sinus tachycardia.  Patient was then considered for admission to the hospital for possible sepsis secondary to pneumonia with hypoxic respiratory failure.  Chest CT: "Lungs/Pleura: No pleural effusion or pneumothorax. Limited  assessment of the lung parenchyma due to respiratory motion  artifact. Small areas of consolidation and clustered nodular  opacities in the posterior right upper lobe, right lower lobe, and  right middle lobe. Mild dependent opacity in the left lower lobe, likely atelectasis.". ?  ?   ?SLP Plan ? Continue with current plan of care ? ?  ?  ?Recommendations for follow up therapy are one component of a multi-disciplinary discharge planning process, led by the attending physician.  Recommendations may be updated based on patient status, additional functional criteria and insurance authorization. ?  ? ?Recommendations  ?Diet recommendations: Dysphagia 1 (puree);Thin liquid ?Liquids provided via: Cup;Straw (Pinched straw during drinking d/t impulsive drinking) ?Medication Administration:  Crushed with puree ?Supervision: Staff to assist with self feeding;Full supervision/cueing for compensatory strategies ?Compensations: Minimize environmental distractions;Slow rate;Small sips/bites;Lingual sweep for clearance of pocketing;Multiple dry swallows after each bite/sip;Follow solids with liquid (Pinch Straw as needed during drinking) ?Postural Changes and/or Swallow Maneuvers: Out of bed for meals;Seated upright 90 degrees;Upright 30-60 min after meal  ?   ?    ?   ? ? ? ? General recommendations:  (Dietician f/u; Palliative Care f/u) ?Oral Care Recommendations: Oral care BID;Oral care before and after PO;Staff/trained caregiver to provide oral care ?Follow Up Recommendations: Follow physician's recommendations for discharge plan and follow up therapies (at SNF) ?Assistance recommended at discharge: Frequent or constant Supervision/Assistance ?SLP Visit Diagnosis: Dysphagia, oral phase (R13.11) (baseline Down Syndrome and Dementia; GERD) ?Plan: Continue with current plan of care ? ? ? ? ?  ?  ? ? ? ? ? ?Jerilynn Som, MS, CCC-SLP ?Speech Language Pathologist ?Rehab Services; [facility] [place] ?[phone] (ascom) ?Lori Petersen ? ?05/20/2021, 5:58 PM ?

## 2021-05-20 NOTE — Progress Notes (Signed)
pt blood pressure 75/52 now 69/52 HR101. NP Sharion Settler notified. Bolus and albumin running per orders. ?

## 2021-05-20 NOTE — Progress Notes (Signed)
?PROGRESS NOTE ? ? ? ?Maryann Mccall [facility]  IPJ:[id number] DOB: 11-Jan-1956 DOA: 05/17/2021 ?PCP: [facility] [place]  ? ? ?Assessment & Plan: ?  ?Principal Problem: ?  Acute respiratory failure with hypoxia ([place]) ?Active Problems: ?  Chronic diastolic CHF (congestive heart failure) ([place]) ?  Hypothyroid ?  Mixed Alzheimer's and vascular dementia ([place]) ?  Obesity, Class III, BMI 40-49.9 (morbid obesity) ([place]) ?  Pneumonia ?  Dementia ([place]) ?  HLD (hyperlipidemia) ?  Sepsis ([place]) ?  Respiratory failure ([place]) ? ?Assessment and Plan: ? ?  ?Acute hypoxic respiratory failure: initially required BIPAP but has since been weaned off. Placed back on BiPAP overnight but has since been weaned off, currently 3L . Continue on IV ceftriaxone, azithromycin.  ?  ?Mild metabolic encephalopathy: likely secondary to pneumonia. Has underlying dementia & Down syndrome. CT head ordered. Continue w/ supportive care ?  ?Sepsis: met criteria w/ tachycardia, tachypnea, fever, leukocytosis & secondary to multifocal pneumonia & UTI. Abxs changed to IV azithromycin, ceftriaxone. Sepsis resolved  ? ?Multifocal pneumonia: abxs changed to IV azithromycin, ceftriaxone. Continue on bronchodilators & encourage incentive spirometry. CT chest ordered ? ?AKI: baseline Cr/GFR is unknown. Cr continues to trend up daily. Avoid nephrotoxic meds  ? ?UTI: w/ hx left ureteral stent in the past with distal left ureteral stone with persistent mild left hydroureteronephrosis. Urine cx is growing yeast. Will start fluconazole. Scheduled on 05/20/21 for urethral stone removal outpatient but this will need to be rescheduled  ? ?Unlikely bacteremia: blood cxs growing staph species, likely containment. Repeat blood cxs NGTD ?  ?Dementia: w/ hx of down's syndrome. Poor historian. Continue on home dose of keppra  ?  ?Likely chronic CHF: echo from 03/25/2021 showed EF 55 to 60% with no regional wall motion abnormality. ?  ?HLD: continue on statin  ?   ?Hypothyroidism: continue on home dose of synthroid  ? ?Thrombocytopenia: resolved  ? ? ? ? ?DVT prophylaxis: heparin  ?Code Status: full  ?Family Communication: ?Disposition Plan: likely d/c back to [facility]  ? ?Level of care: Stepdown ? ?Status is: Inpatient ?Remains inpatient appropriate because: severity of illness ? ? ?Consultants:  ? ? ?Procedures: ? ?Antimicrobials: ceftriaxone, azithromycin  ? ? ?Subjective: ?Pt is still lethargic but easier to wake up  ? ?Objective: ?Vitals:  ? 05/20/21 0400 05/20/21 0430 05/20/21 0500 05/20/21 0600  ?BP: (!) 91/56 (!) 83/52 (!) 97/57 (!) 97/59  ?Pulse: 79 76 73 68  ?Resp: _0 ?Temp: 98.1 ?F (36.7 ?C)     ?TempSrc: Axillary     ?SpO2: 100% 98% 100% 100%  ?Weight: 97 kg     ?Height: _1  (1.727 m)     ? ? ?Intake/Output Summary (Last 24 hours) at 05/20/2021 0644 ?Last data filed at 05/20/2021 0600 ?Gross per 24 hour  ?Intake 2062.11 ml  ?Output 1026 ml  ?Net 1036.11 ml  ? ?Filed Weights  ? 05/18/21 1802 05/19/21 0535 05/20/21 0400  ?Weight: 97.1 kg 97.6 kg 97 kg  ? ? ?Examination: ? ?General exam: Appears comfortable but lethargic   ?Respiratory system: course breath sounds b/l  ?Cardiovascular system: S1&S2+. No rubs or clicks  ?Gastrointestinal system: Abd is soft, NT, obese & hypoactive bowel sounds  ?Central nervous system: lethargic. Moves all extremities ?Psychiatry: Judgement and insight appears poor. Flat mood and affect  ? ? ? ?Data Reviewed: I have personally reviewed following labs and imaging studies ? ?CBC: ?Recent Labs  ?Lab 05/17/21 ?1110 05/18/21 ?7341 05/19/21 ?  1011 05/20/21 ?0138  ?WBC 10.6* 8.3 15.5* 14.0*  ?NEUTROABS 8.5*  --   --   --   ?HGB 12.5 10.9* 11.5* 10.7*  ?HCT 42.5 38.5 38.0 36.6  ?MCV 100.2* 106.4* 99.2 99.7  ?PLT 221 138* 168 100*  ? ?Basic Metabolic Panel: ?Recent Labs  ?Lab 05/17/21 ?1110 05/18/21 ?1779 05/19/21 ?1011 05/20/21 ?0138  ?NA 141 144 142 142  ?K 3.8 3.5 4.3 3.8  ?CL 102 116* 109 110  ?CO2 27 18* 23 21*  ?GLUCOSE 163* 122*  131* 183*  ?BUN _0 27*  ?CREATININE 1.45* 0.87 1.33* 1.64*  ?CALCIUM 8.6* 6.9* 8.0* 7.9*  ?MG  --  1.9  --   --   ? ?GFR: ?Estimated Creatinine Clearance: 41.6 mL/min (A) (by C-G formula based on SCr of 1.64 mg/dL (H)). ?Liver Function Tests: ?Recent Labs  ?Lab 05/17/21 ?1110 05/18/21 ?0540  ?AST 18 17  ?ALT 9 6  ?ALKPHOS 46 33*  ?BILITOT 0.6 0.4  ?PROT 7.7 6.6  ?ALBUMIN 3.1* 2.6*  ? ?No results for input(s): LIPASE, AMYLASE in the last 168 hours. ?No results for input(s): AMMONIA in the last 168 hours. ?Coagulation Profile: ?Recent Labs  ?Lab 05/17/21 ?1110  ?INR 1.1  ? ?Cardiac Enzymes: ?No results for input(s): CKTOTAL, CKMB, CKMBINDEX, TROPONINI in the last 168 hours. ?BNP (last 3 results) ?No results for input(s): PROBNP in the last 8760 hours. ?HbA1C: ?Recent Labs  ?  05/17/21 ?1308  ?HGBA1C 5.9*  ? ?CBG: ?Recent Labs  ?Lab 05/19/21 ?3903 05/20/21 ?0305  ?GLUCAP 142* 163*  ? ?Lipid Profile: ?No results for input(s): CHOL, HDL, LDLCALC, TRIG, CHOLHDL, LDLDIRECT in the last 72 hours. ?Thyroid Function Tests: ?Recent Labs  ?  05/17/21 ?1308  ?TSH 1.069  ? ?Anemia Panel: ?No results for input(s): VITAMINB12, FOLATE, FERRITIN, TIBC, IRON, RETICCTPCT in the last 72 hours. ?Sepsis Labs: ?Recent Labs  ?Lab 05/17/21 ?1110 05/17/21 ?1308 05/20/21 ?0138  ?LATICACIDVEN 2.4* 1.6 2.4*  ? ? ?Recent Results (from the past 240 hour(s))  ?Blood Culture (routine x 2)     Status: Abnormal  ? Collection Time: 05/17/21 11:10 AM  ? Specimen: BLOOD  ?Result Value Ref Range Status  ? Specimen Description   Final  ?  BLOOD RIGHT ANTECUBITAL ?Performed at [facility], [address] [place], [place] [address] ?  ? Special Requests   Final  ?  BOTTLES DRAWN AEROBIC AND ANAEROBIC Blood Culture adequate volume ?Performed at [facility], [address], [place], [place] [address] ?  ? Culture  Setup Time   Final  ?  GRAM POSITIVE COCCI ?AEROBIC BOTTLE ONLY ?Organism ID to follow ?CRITICAL RESULT CALLED TO, READ  BACK BY AND VERIFIED WITH: MORGAN HICKS 05/18/21 1036 MW ?  ? Culture (A)  Final  ?  STAPHYLOCOCCUS CAPITIS ?THE SIGNIFICANCE OF ISOLATING THIS ORGANISM FROM A SINGLE SET OF BLOOD CULTURES WHEN MULTIPLE SETS ARE DRAWN IS UNCERTAIN. PLEASE NOTIFY THE MICROBIOLOGY DEPARTMENT WITHIN ONE WEEK IF SPECIATION AND SENSITIVITIES ARE REQUIRED. ?Performed at [facility], [place] [address], [place], [place] [address] ?  ? Report Status 05/20/2021 FINAL  Final  ?Blood Culture (routine x 2)     Status: None (Preliminary result)  ? Collection Time: 05/17/21 11:10 AM  ? Specimen: BLOOD  ?Result Value Ref Range Status  ? Specimen Description BLOOD RIGHT FA  Final  ? Special Requests   Final  ?  BOTTLES DRAWN AEROBIC AND ANAEROBIC Blood Culture results may not be  optimal due to an inadequate volume of blood received in culture bottles  ? Culture   Final  ?  NO GROWTH 3 DAYS ?Performed at [facility], [address], [place], [place] [address] ?  ? Report Status PENDING  Incomplete  ?Resp Panel by RT-PCR (Flu A&B, Covid) Nasopharyngeal Swab     Status: None  ? Collection Time: 05/17/21 11:10 AM  ? Specimen: Nasopharyngeal Swab; Nasopharyngeal(NP) swabs in vial transport medium  ?Result Value Ref Range Status  ? SARS Coronavirus 2 by RT PCR NEGATIVE NEGATIVE Final  ?  Comment: (NOTE) ?SARS-CoV-2 target nucleic acids are NOT DETECTED. ? ?The SARS-CoV-2 RNA is generally detectable in upper respiratory ?specimens during the acute phase of infection. The lowest ?concentration of SARS-CoV-2 viral copies this assay can detect is ?138 copies/mL. A negative result does not preclude SARS-Cov-2 ?infection and should not be used as the sole basis for treatment or ?other patient management decisions. A negative result may occur with  ?improper specimen collection/handling, submission of specimen other ?than nasopharyngeal swab, presence of viral mutation(s) within the ?areas targeted by this assay, and inadequate number of  viral ?copies(<138 copies/mL). A negative result must be combined with ?clinical observations, patient history, and epidemiological ?information. The expected result is Negative. ? ?Fact Sheet for Patients:  ?FedLocator.es

## 2021-05-21 DIAGNOSIS — F0390 Unspecified dementia without behavioral disturbance: Secondary | ICD-10-CM | POA: Diagnosis not present

## 2021-05-21 DIAGNOSIS — N179 Acute kidney failure, unspecified: Secondary | ICD-10-CM | POA: Diagnosis not present

## 2021-05-21 DIAGNOSIS — J9601 Acute respiratory failure with hypoxia: Secondary | ICD-10-CM | POA: Diagnosis not present

## 2021-05-21 LAB — BASIC METABOLIC PANEL
Anion gap: 8 (ref 5–15)
BUN: 24 mg/dL — ABNORMAL HIGH (ref 8–23)
CO2: 26 mmol/L (ref 22–32)
Calcium: 8 mg/dL — ABNORMAL LOW (ref 8.9–10.3)
Chloride: 109 mmol/L (ref 98–111)
Creatinine, Ser: 1.56 mg/dL — ABNORMAL HIGH (ref 0.44–1.00)
GFR, Estimated: 37 mL/min — ABNORMAL LOW (ref >60)
Glucose, Bld: 104 mg/dL — ABNORMAL HIGH (ref 70–99)
Potassium: 3.4 mmol/L — ABNORMAL LOW (ref 3.5–5.1)
Sodium: 143 mmol/L (ref 135–145)

## 2021-05-21 LAB — CBC
HCT: 31.9 % — ABNORMAL LOW (ref 36.0–46.0)
Hemoglobin: 9.4 g/dL — ABNORMAL LOW (ref 12.0–15.0)
MCH: 29 pg (ref 26.0–34.0)
MCHC: 29.5 g/dL — ABNORMAL LOW (ref 30.0–36.0)
MCV: 98.5 fL (ref 80.0–100.0)
Platelets: 147 10*3/uL — ABNORMAL LOW (ref 150–400)
RBC: 3.24 MIL/uL — ABNORMAL LOW (ref 3.87–5.11)
RDW: 17 % — ABNORMAL HIGH (ref 11.5–15.5)
WBC: 10.8 10*3/uL — ABNORMAL HIGH (ref 4.0–10.5)
nRBC: 0 % (ref 0.0–0.2)

## 2021-05-21 LAB — GLUCOSE, CAPILLARY: Glucose-Capillary: 111 mg/dL — ABNORMAL HIGH (ref 70–99)

## 2021-05-21 MED ORDER — MIDODRINE HCL 5 MG PO TABS
5.0000 mg | ORAL_TABLET | Freq: Three times a day (TID) | ORAL | Status: DC
  Administered 2021-05-21 – 2021-05-27 (×18): 5 mg via ORAL
  Filled 2021-05-21 (×17): qty 1

## 2021-05-21 MED ORDER — FUROSEMIDE 10 MG/ML IJ SOLN
40.0000 mg | Freq: Once | INTRAMUSCULAR | Status: AC
  Administered 2021-05-21: 40 mg via INTRAVENOUS
  Filled 2021-05-21: qty 4

## 2021-05-21 NOTE — Progress Notes (Signed)
?   05/21/21 2040  ?Assess: MEWS Score  ?Temp 98.6 ?F (37 ?C)  ?BP 105/62  ?Pulse Rate (!) 103  ?Resp (!) 35  ?SpO2 98 %  ?O2 Device Nasal Cannula  ?O2 Flow Rate (L/min) 4 L/min  ?Assess: MEWS Score  ?MEWS Temp 0  ?MEWS Systolic 0  ?MEWS Pulse 1  ?MEWS RR 2  ?MEWS LOC 0  ?MEWS Score 3  ?MEWS Score Color Yellow  ?Assess: if the MEWS score is Yellow or Red  ?Were vital signs taken at a resting state? Yes  ?Focused Assessment No change from prior assessment  ?Does the patient meet 2 or more of the SIRS criteria? Yes  ?Does the patient have a confirmed or suspected source of infection? No  ?MEWS guidelines implemented *See Row Information* Yes  ?Treat  ?Pain Scale 0-10  ?Breathing 2  ?Negative Vocalization 0  ?Facial Expression 0  ?Body Language 0  ?Consolability 0  ?PAINAD Score 2  ?Take Vital Signs  ?Increase Vital Sign Frequency  Yellow: Q 2hr X 2 then Q 4hr X 2, if remains yellow, continue Q 4hrs  ?Escalate  ?MEWS: Escalate Yellow: discuss with charge nurse/RN and consider discussing with provider and RRT  ?Notify: Charge Nurse/RN  ?Name of Charge Nurse/RN Notified Ria Comment  ?Date Charge Nurse/RN Notified 05/21/21  ?Time Charge Nurse/RN Notified 2055  ?Document  ?Patient Outcome Not stable and remains on department  ?Progress note created (see row info) Yes  ?Assess: SIRS CRITERIA  ?SIRS Temperature  0  ?SIRS Pulse 1  ?SIRS Respirations  1  ?SIRS WBC 0  ?SIRS Score Sum  2  ? ? ?

## 2021-05-21 NOTE — Evaluation (Signed)
Occupational Therapy Evaluation ?Patient Details ?Name: Lori Petersen [facility] ?MRN: [id number] ?DOB: September 22, 1955 ?Today's Date: 05/21/2021 ? ? ?History of Present Illness 66 y.o. female Patient is a 66 years old female with past medical history of Down syndrome, heart failure, hypertension, dementia, hyperlipidemia presented to hospital with complaints of cough and shortness of breath.  Of note, patient was recently seen by her primary care physician as outpatient.  Initially patient had presented with some stuffy runny nose subsequently she had increasing shortness of breath.  She was noted to be hypoxic and EMS was called in.  Patient required nonrebreather mask in route to the hospital.  History is extremely limited due to patient's underlying dementia and Down syndrome and family not available.  ? ?Clinical Impression ?  ?Upon entering the room, pt supine in bed with no signs of symptoms of pain. Pt with expressive difficulties and unable to answer questions in regards to PLOF. She only says, "What" or "Huh". Pt does not follow and commands during session and is resistive with UE AROM testing. OT is unable to bend B knees into flexion which leads me to believe pt is bed bound at baseline. Pt is from a facility although unsure which one. OT attempting to call legal guardian to confirm but no answer. Pt requiring total A for all tasks this session and bed mobility with no effort with multimodal cuing. Pt does not appear to be a good rehab candidate. OT to SIGN off at this time secondary to pt likely being at baseline level of function. ?   ? ?Recommendations for follow up therapy are one component of a multi-disciplinary discharge planning process, led by the attending physician.  Recommendations may be updated based on patient status, additional functional criteria and insurance authorization.  ? ?Follow Up Recommendations ? Long-term institutional care without follow-up therapy  ?  ?Assistance Recommended at Discharge  Frequent or constant Supervision/Assistance  ?   ?Functional Status Assessment ? Patient has not had a recent decline in their functional status  ?Equipment Recommendations ? None recommended by OT  ?  ?   ?Precautions / Restrictions Precautions ?Precautions: Fall ?Restrictions ?Weight Bearing Restrictions: No  ? ?  ? ?Mobility Bed Mobility ?  ?  ?  ?  ?  ?  ?  ?General bed mobility comments: total A ?  ? ?Transfers ?  ?  ?  ?  ?  ?  ?  ?  ?  ?  ?  ? ?  ?   ? ?ADL either performed or assessed with clinical judgement  ? ?ADL   ?  ?  ?  ?  ?  ?  ?  ?  ?  ?  ?  ?  ?  ?  ?  ?  ?  ?  ?  ?General ADL Comments: total A for all self care tasks  ? ? ? ?Vision Patient Visual Report: No change from baseline ?   ?   ?   ?   ? ?Pertinent Vitals/Pain Pain Assessment ?Pain Assessment: Faces ?Faces Pain Scale: No hurt  ? ? ? ?Hand Dominance   ?  ?Extremity/Trunk Assessment Upper Extremity Assessment ?Upper Extremity Assessment: Difficult to assess due to impaired cognition (Pt does not follow commands for testing and is resistive to ROM) ?  ?Lower Extremity Assessment ?Lower Extremity Assessment:  (B knees appear contracted and unable to flex) ?  ?  ?  ?Communication Communication ?Communication: Expressive difficulties ?  ?Cognition Arousal/Alertness: Awake/alert ?  Behavior During Therapy: Flat affect ?Overall Cognitive Status: History of cognitive impairments - at baseline ?  ?  ?  ?  ?  ?  ?  ?  ?  ?  ?  ?  ?  ?  ?  ?  ?General Comments: hx of down syndrome and dementia ?  ?  ?   ?   ?   ? ? ?Home Living Family/patient expects to be discharged to:: Skilled nursing facility ?  ?  ?  ?  ?  ?  ?  ?  ?  ?  ?  ?  ?  ?  ?  ?  ?Additional Comments: per chart review, pt is from facility but no further details ?  ? ?  ?Prior Functioning/Environment   ?  ?  ?  ?  ?  ?  ?  ?ADLs Comments: OT attempting to call legal guardian listed in chart but no answer. Pt appears to be bed bound. She is resistive to movement and B knees appear contracted  and unable to bend. ?  ? ?   ?   ?   ?   ?OT Goals(Current goals can be found in the care plan section) Acute Rehab OT Goals ?OT Goal Formulation: Patient unable to participate in goal setting ?Time For Goal Achievement: 05/21/21  ?OT Frequency:   ?  ? ?   ?AM-PAC OT "6 Clicks" Daily Activity     ?Outcome Measure Help from another person eating meals?: Total ?Help from another person taking care of personal grooming?: Total ?Help from another person toileting, which includes using toliet, bedpan, or urinal?: Total ?Help from another person bathing (including washing, rinsing, drying)?: Total ?Help from another person to put on and taking off regular upper body clothing?: Total ?Help from another person to put on and taking off regular lower body clothing?: Total ?6 Click Score: 6 ?  ?End of Session Nurse Communication: Mobility status ? ?Activity Tolerance: Patient tolerated treatment well ?Patient left: in bed;with call bell/phone within reach;with bed alarm set ? ?   ?              ?Time: 8144-8185 ?OT Time Calculation (min): 18 min ?Charges:  OT General Charges ?$OT Visit: 1 Visit ?OT Evaluation ?$OT Eval Moderate Complexity: 1 Mod ?OT Treatments ?$Therapeutic Activity: 8-22 mins ? ?Jackquline Denmark, MS, OTR/L , CBIS ?ascom [phone]  ?05/21/21, 9:43 AM  ?

## 2021-05-21 NOTE — Evaluation (Signed)
Physical Therapy Evaluation ?Patient Details ?Name: Lori Petersen [facility] ?MRN: [id number] ?DOB: 06-Jul-1955 ?Today's Date: 05/21/2021 ? ?History of Present Illness ? Pt is a 66 y/o F admitted on 05/17/21 after presenting to the ED with c/o cough & SOB. Pt was initially on nonrebreather mask but later placed on bipap. Pt is being treated for acute hypoxic respiratory failure & mild metabolic encephalopathy. PMH: down syndrome, heart failure, HTN, dementia, HLD  ?Clinical Impression ? Pt seen for PT evaluation with pt asleep but opening eyes to PT's voice & when PT educates pt she's in the hospital pt repeats "the hospital?" But otherwise no further verbal communication during session. PT attempts to assist pt to sitting EOB but pt does not/unable to participate & unsafe to continue without +2 assist. Pt appears to have limited BLE ROM (LLE worse than RLE) but WNL PROM to BUE elbows & wrist. Pt left upright in bed with all needs in reach. Will continue to follow pt acutely to progress mobility as able to decrease caregiver burden (currently unsure of baseline level of function as pt unable to provide & OT unable to reach caregiver despite multiple attempts at calling them on phone).   ?   ? ?Recommendations for follow up therapy are one component of a multi-disciplinary discharge planning process, led by the attending physician.  Recommendations may be updated based on patient status, additional functional criteria and insurance authorization. ? ?Follow Up Recommendations Skilled nursing-short term rehab (<3 hours/day) ? ?  ?Assistance Recommended at Discharge Frequent or constant Supervision/Assistance  ?Patient can return home with the following ? Two people to help with walking and/or transfers;Two people to help with bathing/dressing/bathroom;Direct supervision/assist for medications management;Help with stairs or ramp for entrance;Assistance with feeding;Assist for transportation;Direct supervision/assist for financial  management;Assistance with cooking/housework ? ?  ?Equipment Recommendations None recommended by PT  ?Recommendations for Other Services ?    ?  ?Functional Status Assessment  (unsure)  ? ?  ?Precautions / Restrictions Precautions ?Precautions: Fall ?Restrictions ?Weight Bearing Restrictions: No  ? ?  ? ?Mobility ? Bed Mobility ?Overal bed mobility: Needs Assistance ?  ?  ?  ?  ?  ?  ?General bed mobility comments: PT provides total assist to initiate supine>sit & is unsafe to assist to sitting EOB without +2 assist. Pt requires dependent +2 assist to scoot to [facility]. ?  ? ?Transfers ?  ?  ?  ?  ?  ?  ?  ?  ?  ?  ?  ? ?Ambulation/Gait ?  ?  ?  ?  ?  ?  ?  ?  ? ?Stairs ?  ?  ?  ?  ?  ? ?Wheelchair Mobility ?  ? ?Modified Rankin (Stroke Patients Only) ?  ? ?  ? ?Balance Overall balance assessment: Needs assistance ?  ?Sitting balance-Leahy Scale: Zero ?Sitting balance - Comments: During attempts to come to sitting EOB ?  ?  ?  ?  ?  ?  ?  ?  ?  ?  ?  ?  ?  ?  ?  ?   ? ? ? ?Pertinent Vitals/Pain Pain Assessment ?Pain Assessment: Faces ?Faces Pain Scale: No hurt  ? ? ?Home Living Family/patient expects to be discharged to:: Unsure ?  ?  ?  ?  ?  ?  ?  ?  ?  ?Additional Comments: per chart review, pt is from facility but no further details  ?  ?Prior Function   ?  ?  ?  ?  ?  ?  ?  ?  ADLs Comments: OT attempted to call legal guardian listed in chart but no answer ?  ? ? ?Hand Dominance  ?   ? ?  ?Extremity/Trunk Assessment  ? Upper Extremity Assessment ?Upper Extremity Assessment:  (Pt unable to follow commands to elicit active movement but PT able to perform PROM to BUE elbows & wrists) ?  ? ?Lower Extremity Assessment ?Lower Extremity Assessment:  (Pt moves RLE against gravity in bed of her own volition but is unable to follow commands to do so. PT is able to perform minimal ROM to R knee but BLE ankles appear inverted & plantarflexed) ?  ? ?   ?Communication  ? Communication: Expressive difficulties  ?Cognition  Arousal/Alertness: Awake/alert ?Behavior During Therapy: Flat affect ?Overall Cognitive Status: History of cognitive impairments - at baseline ?  ?  ?  ?  ?  ?  ?  ?  ?  ?  ?  ?  ?  ?  ?  ?  ?General Comments: hx of down syndrome and dementia, no caregiver/family present to determine baseline level of function ?  ?  ? ?  ?General Comments   ? ?  ?Exercises    ? ?Assessment/Plan  ?  ?PT Assessment Patient needs continued PT services  ?PT Problem List Decreased strength;Decreased mobility;Decreased safety awareness;Decreased range of motion;Decreased activity tolerance;Cardiopulmonary status limiting activity;Decreased balance;Decreased knowledge of use of DME ? ?   ?  ?PT Treatment Interventions DME instruction;Therapeutic activities;Modalities;Gait training;Therapeutic exercise;Patient/family education;Functional mobility training;Neuromuscular re-education;Manual techniques;Balance training   ? ?PT Goals (Current goals can be found in the Care Plan section)  ?Acute Rehab PT Goals ?PT Goal Formulation: Patient unable to participate in goal setting ?Time For Goal Achievement: 06/04/21 ? ?  ?Frequency Min 2X/week ?  ? ? ?Co-evaluation   ?  ?  ?  ?  ? ? ?  ?AM-PAC PT "6 Clicks" Mobility  ?Outcome Measure Help needed turning from your back to your side while in a flat bed without using bedrails?: Total ?Help needed moving from lying on your back to sitting on the side of a flat bed without using bedrails?: Total ?Help needed moving to and from a bed to a chair (including a wheelchair)?: Total ?Help needed standing up from a chair using your arms (e.g., wheelchair or bedside chair)?: Total ?Help needed to walk in hospital room?: Total ?Help needed climbing 3-5 steps with a railing? : Total ?6 Click Score: 6 ? ?  ?End of Session Equipment Utilized During Treatment: Oxygen ?Activity Tolerance: Patient tolerated treatment well ?Patient left: in bed;with call bell/phone within reach;with bed alarm set ?Nurse Communication:  Mobility status ?PT Visit Diagnosis: Muscle weakness (generalized) (M62.81);Difficulty in walking, not elsewhere classified (R26.2);Other abnormalities of gait and mobility (R26.89) ?  ? ?Time: 1102-1110 ?PT Time Calculation (min) (ACUTE ONLY): 8 min ? ? ?Charges:   PT Evaluation ?$PT Eval High Complexity: 1 High ?  ?  ?   ? ? ?Lavone Nian, PT, DPT ?05/21/21, 11:26 AM ? ? ?Waunita Schooner ?05/21/2021, 11:24 AM ? ?

## 2021-05-21 NOTE — Progress Notes (Signed)
?PROGRESS NOTE ? ? ? ?Lori Petersen [facility]  GQB:[id number] DOB: 08-16-1955 DOA: 05/17/2021 ?PCP: [facility]  ? ? ?Assessment & Plan: ?  ?Principal Problem: ?  Acute respiratory failure with hypoxia ([place]) ?Active Problems: ?  Chronic diastolic CHF (congestive heart failure) ([place]) ?  Hypothyroid ?  Mixed Alzheimer's and vascular dementia ([place]) ?  Obesity, Class III, BMI 40-49.9 (morbid obesity) ([place]) ?  Pneumonia ?  Dementia ([place]) ?  HLD (hyperlipidemia) ?  Sepsis ([place]) ?  Respiratory failure ([place]) ? ?Assessment and Plan: ? ?  ?Acute hypoxic respiratory failure: intermittently requiring BiPAP. Continue on supplemental oxygen and wean as tolerated ?  ?Mild metabolic encephalopathy: likely secondary to pneumonia. Has underlying dementia & Down syndrome. CT head shows no acute intracranial abnormalities  ?  ?Sepsis: met criteria w/ tachycardia, tachypnea, fever, leukocytosis & secondary to multifocal pneumonia & UTI. Abxs changed to IV azithromycin, ceftriaxone. Sepsis resolved  ? ?Multifocal pneumonia: continue on IV rocephin, azithromycin, bronchodilators & encourage incentive spirometry. CT chest shows b/l opacities concerning for pneumonia vs possible edema.  ? ?AKI: baseline Cr/GFR is unknown. Cr is labile. Avoid nephrotoxic meds  ? ?UTI: w/ hx left ureteral stent in the past with distal left ureteral stone with persistent mild left hydroureteronephrosis. Urine cx growing yeast, completed fluconazole course. Scheduled on 05/20/21 for urethral stone removal outpatient but this will need to be rescheduled  ? ?Unlikely bacteremia: blood cxs growing staph species, likely containment. Repeat blood cxs NGTD ?  ?Dementia: w/ hx of down's syndrome. Poor historian. Continue on home dose of keppra  ?  ?Likely chronic CHF: echo from 03/25/2021 showed EF 55 to 60% with no regional wall motion abnormality. ?  ?HLD: continue on statin  ?  ?Hypothyroidism: continue on home dose of levothyroxine  ? ?Thrombocytopenia:  etiology unclear, labile  ? ? ? ? ?DVT prophylaxis: heparin  ?Code Status: full  ?Family Communication: ?Disposition Plan: likely d/c back to [facility]  ? ?Level of care: Progressive ? ?Status is: Inpatient ?Remains inpatient appropriate because: severity of illness ? ? ?Consultants:  ? ? ?Procedures: ? ?Antimicrobials: ceftriaxone, azithromycin  ? ? ?Subjective: ?Pt appears more awake and alert today  ? ?Objective: ?Vitals:  ? 05/20/21 2327 05/21/21 0313 05/21/21 0518 05/21/21 0530  ?BP: 95/73     ?Pulse: 96 92    ?Resp: 19     ?Temp: 98.6 ?F (37 ?C)  98.5 ?F (36.9 ?C)   ?TempSrc:   Oral   ?SpO2: 100% 99% 100%   ?Weight:    97.7 kg  ?Height:      ? ? ?Intake/Output Summary (Last 24 hours) at 05/21/2021 0745 ?Last data filed at 05/21/2021 0100 ?Gross per 24 hour  ?Intake 1957.76 ml  ?Output 500 ml  ?Net 1457.76 ml  ? ?Filed Weights  ? 05/20/21 0400 05/20/21 2022 05/21/21 0530  ?Weight: 97 kg 98.2 kg 97.7 kg  ? ? ?Examination: ? ?General exam: Appears alert & awake. Obese  ?Respiratory system: course breath sounds b/l. Rales b/l  ?Cardiovascular system: S1/S2+. No rubs or clicks  ?Gastrointestinal system: Abd is soft, NT, obese & hypoactive bowel sounds  ?Central nervous system: Awake & alert  ?Psychiatry: Judgement and insight appears poor. Flat mood and affect ? ? ? ?Data Reviewed: I have personally reviewed following labs and imaging studies ? ?CBC: ?Recent Labs  ?Lab 05/17/21 ?1110 05/18/21 ?8280 05/19/21 ?1011 05/20/21 ?0138 05/21/21 ?0349  ?WBC 10.6* 8.3 15.5* 14.0* 10.8*  ?NEUTROABS 8.5*  --   --   --   --   ?  HGB 12.5 10.9* 11.5* 10.7* 9.4*  ?HCT 42.5 38.5 38.0 36.6 31.9*  ?MCV 100.2* 106.4* 99.2 99.7 98.5  ?PLT 221 138* 168 100* 147*  ? ?Basic Metabolic Panel: ?Recent Labs  ?Lab 05/17/21 ?1110 05/18/21 ?2500 05/19/21 ?1011 05/20/21 ?0138 05/21/21 ?3704  ?NA 141 144 142 142 143  ?K 3.8 3.5 4.3 3.8 3.4*  ?CL 102 116* 109 110 109  ?CO2 27 18* 23 21* 26  ?GLUCOSE 163* 122* 131* 183* 104*  ?BUN _0 27* 24*   ?CREATININE 1.45* 0.87 1.33* 1.64* 1.56*  ?CALCIUM 8.6* 6.9* 8.0* 7.9* 8.0*  ?MG  --  1.9  --   --   --   ? ?GFR: ?Estimated Creatinine Clearance: 43.9 mL/min (A) (by C-G formula based on SCr of 1.56 mg/dL (H)). ?Liver Function Tests: ?Recent Labs  ?Lab 05/17/21 ?1110 05/18/21 ?0540  ?AST 18 17  ?ALT 9 6  ?ALKPHOS 46 33*  ?BILITOT 0.6 0.4  ?PROT 7.7 6.6  ?ALBUMIN 3.1* 2.6*  ? ?No results for input(s): LIPASE, AMYLASE in the last 168 hours. ?No results for input(s): AMMONIA in the last 168 hours. ?Coagulation Profile: ?Recent Labs  ?Lab 05/17/21 ?1110  ?INR 1.1  ? ?Cardiac Enzymes: ?No results for input(s): CKTOTAL, CKMB, CKMBINDEX, TROPONINI in the last 168 hours. ?BNP (last 3 results) ?No results for input(s): PROBNP in the last 8760 hours. ?HbA1C: ?No results for input(s): HGBA1C in the last 72 hours. ? ?CBG: ?Recent Labs  ?Lab 05/19/21 ?8889 05/20/21 ?0305 05/20/21 ?1694  ?GLUCAP 142* 163* 107*  ? ?Lipid Profile: ?No results for input(s): CHOL, HDL, LDLCALC, TRIG, CHOLHDL, LDLDIRECT in the last 72 hours. ?Thyroid Function Tests: ?No results for input(s): TSH, T4TOTAL, FREET4, T3FREE, THYROIDAB in the last 72 hours. ? ?Anemia Panel: ?No results for input(s): VITAMINB12, FOLATE, FERRITIN, TIBC, IRON, RETICCTPCT in the last 72 hours. ?Sepsis Labs: ?Recent Labs  ?Lab 05/17/21 ?1110 05/17/21 ?1308 05/20/21 ?0138 05/20/21 ?1905  ?LATICACIDVEN 2.4* 1.6 2.4* 1.7  ? ? ?Recent Results (from the past 240 hour(s))  ?Blood Culture (routine x 2)     Status: Abnormal  ? Collection Time: 05/17/21 11:10 AM  ? Specimen: BLOOD  ?Result Value Ref Range Status  ? Specimen Description   Final  ?  BLOOD RIGHT ANTECUBITAL ?Performed at [facility], [address] [place], [place] [address] ?  ? Special Requests   Final  ?  BOTTLES DRAWN AEROBIC AND ANAEROBIC Blood Culture adequate volume ?Performed at [facility], [address] [place], [place] [address] ?  ? Culture  Setup Time   Final  ?  GRAM POSITIVE  COCCI ?AEROBIC BOTTLE ONLY ?Organism ID to follow ?CRITICAL RESULT CALLED TO, READ BACK BY AND VERIFIED WITH: MORGAN HICKS 05/18/21 1036 MW ?  ? Culture (A)  Final  ?  STAPHYLOCOCCUS CAPITIS ?THE SIGNIFICANCE OF ISOLATING THIS ORGANISM FROM A SINGLE SET OF BLOOD CULTURES WHEN MULTIPLE SETS ARE DRAWN IS UNCERTAIN. PLEASE NOTIFY THE MICROBIOLOGY DEPARTMENT WITHIN ONE WEEK IF SPECIATION AND SENSITIVITIES ARE REQUIRED. ?Performed at [facility], [place] [address] [place], [place] [address] ?  ? Report Status 05/20/2021 FINAL  Final  ?Blood Culture (routine x 2)     Status: None (Preliminary result)  ? Collection Time: 05/17/21 11:10 AM  ? Specimen: BLOOD  ?Result Value Ref Range Status  ? Specimen Description BLOOD RIGHT FA  Final  ? Special Requests   Final  ?  BOTTLES DRAWN AEROBIC AND ANAEROBIC Blood Culture results  may not be optimal due to an inadequate volume of blood received in culture bottles  ? Culture   Final  ?  NO GROWTH 4 DAYS ?Performed at [facility], [address] [place], [place] [address] ?  ? Report Status PENDING  Incomplete  ?Resp Panel by RT-PCR (Flu A&B, Covid) Nasopharyngeal Swab     Status: None  ? Collection Time: 05/17/21 11:10 AM  ? Specimen: Nasopharyngeal Swab; Nasopharyngeal(NP) swabs in vial transport medium  ?Result Value Ref Range Status  ? SARS Coronavirus 2 by RT PCR NEGATIVE NEGATIVE Final  ?  Comment: (NOTE) ?SARS-CoV-2 target nucleic acids are NOT DETECTED. ? ?The SARS-CoV-2 RNA is generally detectable in upper respiratory ?specimens during the acute phase of infection. The lowest ?concentration of SARS-CoV-2 viral copies this assay can detect is ?138 copies/mL. A negative result does not preclude SARS-Cov-2 ?infection and should not be used as the sole basis for treatment or ?other patient management decisions. A negative result may occur with  ?improper specimen collection/handling, submission of specimen other ?than nasopharyngeal swab, presence of viral  mutation(s) within the ?areas targeted by this assay, and inadequate number of viral ?copies(<138 copies/mL). A negative result must be combined with ?clinical observations, patient history, and epidemiological ?information.

## 2021-05-22 DIAGNOSIS — J9601 Acute respiratory failure with hypoxia: Secondary | ICD-10-CM | POA: Diagnosis not present

## 2021-05-22 DIAGNOSIS — F0390 Unspecified dementia without behavioral disturbance: Secondary | ICD-10-CM | POA: Diagnosis not present

## 2021-05-22 DIAGNOSIS — J189 Pneumonia, unspecified organism: Secondary | ICD-10-CM | POA: Diagnosis not present

## 2021-05-22 LAB — BASIC METABOLIC PANEL
Anion gap: 6 (ref 5–15)
BUN: 26 mg/dL — ABNORMAL HIGH (ref 8–23)
CO2: 30 mmol/L (ref 22–32)
Calcium: 8.1 mg/dL — ABNORMAL LOW (ref 8.9–10.3)
Chloride: 113 mmol/L — ABNORMAL HIGH (ref 98–111)
Creatinine, Ser: 1.63 mg/dL — ABNORMAL HIGH (ref 0.44–1.00)
GFR, Estimated: 35 mL/min — ABNORMAL LOW (ref >60)
Glucose, Bld: 137 mg/dL — ABNORMAL HIGH (ref 70–99)
Potassium: 3.5 mmol/L (ref 3.5–5.1)
Sodium: 149 mmol/L — ABNORMAL HIGH (ref 135–145)

## 2021-05-22 LAB — CBC
HCT: 29.7 % — ABNORMAL LOW (ref 36.0–46.0)
Hemoglobin: 8.7 g/dL — ABNORMAL LOW (ref 12.0–15.0)
MCH: 29 pg (ref 26.0–34.0)
MCHC: 29.3 g/dL — ABNORMAL LOW (ref 30.0–36.0)
MCV: 99 fL (ref 80.0–100.0)
Platelets: 175 10*3/uL (ref 150–400)
RBC: 3 MIL/uL — ABNORMAL LOW (ref 3.87–5.11)
RDW: 17.2 % — ABNORMAL HIGH (ref 11.5–15.5)
WBC: 10.2 10*3/uL (ref 4.0–10.5)
nRBC: 0 % (ref 0.0–0.2)

## 2021-05-22 LAB — GLUCOSE, CAPILLARY: Glucose-Capillary: 112 mg/dL — ABNORMAL HIGH (ref 70–99)

## 2021-05-22 MED ORDER — IPRATROPIUM-ALBUTEROL 0.5-2.5 (3) MG/3ML IN SOLN
3.0000 mL | Freq: Three times a day (TID) | RESPIRATORY_TRACT | Status: DC
  Administered 2021-05-22 – 2021-05-27 (×15): 3 mL via RESPIRATORY_TRACT
  Filled 2021-05-22 (×15): qty 3

## 2021-05-22 MED ORDER — FUROSEMIDE 10 MG/ML IJ SOLN
20.0000 mg | Freq: Once | INTRAMUSCULAR | Status: AC
Start: 2021-05-22 — End: 2021-05-22
  Administered 2021-05-22: 20 mg via INTRAVENOUS
  Filled 2021-05-22: qty 2

## 2021-05-22 MED ORDER — METHYLPREDNISOLONE SODIUM SUCC 40 MG IJ SOLR
40.0000 mg | INTRAMUSCULAR | Status: DC
  Administered 2021-05-22: 40 mg via INTRAVENOUS
  Filled 2021-05-22: qty 1

## 2021-05-22 MED ORDER — IPRATROPIUM-ALBUTEROL 0.5-2.5 (3) MG/3ML IN SOLN: 3.0000 mL | RESPIRATORY_TRACT | Status: DC | PRN

## 2021-05-22 MED ORDER — PIPERACILLIN-TAZOBACTAM 3.375 G IVPB
3.3750 g | Freq: Three times a day (TID) | INTRAVENOUS | Status: AC
  Administered 2021-05-22 – 2021-05-28 (×20): 3.375 g via INTRAVENOUS
  Filled 2021-05-22 (×21): qty 50

## 2021-05-22 NOTE — Progress Notes (Signed)
?PROGRESS NOTE ? ? ? ?Lori Petersen [name]  QIH:[id number] DOB: [birth year] DOA: 05/17/2021 ?PCP: [facility]  ? ? ?Assessment & Plan: ?  ?Principal Problem: ?  Acute respiratory failure with hypoxia ([place]) ?Active Problems: ?  Chronic diastolic CHF (congestive heart failure) ([place]) ?  Hypothyroid ?  Mixed Alzheimer's and vascular dementia ([place]) ?  Obesity, Class III, BMI 40-49.9 (morbid obesity) ([place]) ?  Pneumonia ?  Dementia ([place]) ?  HLD (hyperlipidemia) ?  Sepsis ([place]) ?  Respiratory failure ([place]) ? ?Assessment and Plan: ? ?  ?Acute hypoxic respiratory failure: intermittently requiring BiPAP. Continue on supplemental oxygen and wean as tolerated  ?  ?Mild metabolic encephalopathy: likely secondary to pneumonia. Has underlying dementia & Down syndrome. CT head shows no acute intracranial abnormalities  ?  ?Sepsis: met criteria w/ tachycardia, tachypnea, fever, leukocytosis & secondary to multifocal pneumonia & UTI. Abxs changed to IV azithromycin, ceftriaxone. Sepsis resolved  ? ?Multifocal pneumonia: has not improved. D/c IV rocephin, started zosyn, continued azithromycin & started IV steroids. Continue on bronchodilators. CT chest shows b/l opacities concerning for pneumonia vs possible edema  ? ?AKI: baseline Cr/GFR is unknown. Cr is trending up from day prior   ? ?Hypernatremia: free water deficit 2.8L. Encourage free water intake  ? ?UTI: w/ hx left ureteral stent in the past with distal left ureteral stone with persistent mild left hydroureteronephrosis. Urine cx growing yeast, completed fluconazole course. Scheduled on 05/20/21 for urethral stone removal outpatient but this will need to be rescheduled  ? ?Unlikely bacteremia: blood cxs growing staph species, likely containment. Repeat blood cxs NGTD ?  ?Dementia: w/ hx of down's syndrome. Poor historian. Continue on home dose of keppra ?  ?Likely chronic diastolic CHF: echo from 8/75/6433 showed EF 55 to 60% with no regional wall motion  abnormality. ?  ?HLD: continue on statin   ?  ?Hypothyroidism: continue on home dose of levothyroxine  ? ?Thrombocytopenia: WNL today   ? ? ? ? ?DVT prophylaxis: heparin  ?Code Status: full  ?Family Communication: ?Disposition Plan: likely d/c back to [facility]  ? ?Level of care: Progressive ? ?Status is: Inpatient ?Remains inpatient appropriate because: severity of illness ? ? ?Consultants:  ? ? ?Procedures: ? ?Antimicrobials: zosyn, azithromycin  ? ? ?Subjective: ?Pt appears short of breath  ? ?Objective: ?Vitals:  ? 05/21/21 2322 05/22/21 0321 05/22/21 0354 05/22/21 0500  ?BP:  91/68    ?Pulse:  92 83   ?Resp:  (!) 21 20   ?Temp:  100 ?F (37.8 ?C)    ?TempSrc:  Axillary    ?SpO2: 98% 100% 100%   ?Weight:    96 kg  ?Height:      ? ? ?Intake/Output Summary (Last 24 hours) at 05/22/2021 0736 ?Last data filed at 05/21/2021 1845 ?Gross per 24 hour  ?Intake 1149.87 ml  ?Output 1800 ml  ?Net -650.13 ml  ? ?Filed Weights  ? 05/20/21 2022 05/21/21 0530 05/22/21 0500  ?Weight: 98.2 kg 97.7 kg 96 kg  ? ? ?Examination: ? ?General exam: Appears uncomfortable. Obses ?Respiratory system: course breath sounds b/l. Rales b/l  ?Cardiovascular system: S1 & S2+. No rubs or clicks  ?Gastrointestinal system: Abd is soft, NT,obese & hypoactive bowel sounds   ?Central nervous system: Awake and alert ?Psychiatry: Judgement and insight appears poor. Flat mood and affect ? ? ? ?Data Reviewed: I have personally reviewed following labs and imaging studies ? ?CBC: ?Recent Labs  ?Lab 05/17/21 ?1110 05/18/21 ?2951 05/19/21 ?1011 05/20/21 ?  5427 05/21/21 ?0623 05/22/21 ?0449  ?WBC 10.6* 8.3 15.5* 14.0* 10.8* 10.2  ?NEUTROABS 8.5*  --   --   --   --   --   ?HGB 12.5 10.9* 11.5* 10.7* 9.4* 8.7*  ?HCT 42.5 38.5 38.0 36.6 31.9* 29.7*  ?MCV 100.2* 106.4* 99.2 99.7 98.5 99.0  ?PLT 221 138* 168 100* 147* 175  ? ?Basic Metabolic Panel: ?Recent Labs  ?Lab 05/18/21 ?7628 05/19/21 ?1011 05/20/21 ?0138 05/21/21 ?3151 05/22/21 ?0449  ?NA 144 142 142 143 149*  ?K 3.5  4.3 3.8 3.4* 3.5  ?CL 116* 109 110 109 113*  ?CO2 18* 23 21* 26 30  ?GLUCOSE 122* 131* 183* 104* 137*  ?BUN 15 21 27* 24* 26*  ?CREATININE 0.87 1.33* 1.64* 1.56* 1.63*  ?CALCIUM 6.9* 8.0* 7.9* 8.0* 8.1*  ?MG 1.9  --   --   --   --   ? ?GFR: ?Estimated Creatinine Clearance: 41.7 mL/min (A) (by C-G formula based on SCr of 1.63 mg/dL (H)). ?Liver Function Tests: ?Recent Labs  ?Lab 05/17/21 ?1110 05/18/21 ?0540  ?AST 18 17  ?ALT 9 6  ?ALKPHOS 46 33*  ?BILITOT 0.6 0.4  ?PROT 7.7 6.6  ?ALBUMIN 3.1* 2.6*  ? ?No results for input(s): LIPASE, AMYLASE in the last 168 hours. ?No results for input(s): AMMONIA in the last 168 hours. ?Coagulation Profile: ?Recent Labs  ?Lab 05/17/21 ?1110  ?INR 1.1  ? ?Cardiac Enzymes: ?No results for input(s): CKTOTAL, CKMB, CKMBINDEX, TROPONINI in the last 168 hours. ?BNP (last 3 results) ?No results for input(s): PROBNP in the last 8760 hours. ?HbA1C: ?No results for input(s): HGBA1C in the last 72 hours. ? ?CBG: ?Recent Labs  ?Lab 05/19/21 ?7616 05/20/21 ?0305 05/20/21 ?0737 05/21/21 ?0756  ?GLUCAP 142* 163* 107* 111*  ? ?Lipid Profile: ?No results for input(s): CHOL, HDL, LDLCALC, TRIG, CHOLHDL, LDLDIRECT in the last 72 hours. ?Thyroid Function Tests: ?No results for input(s): TSH, T4TOTAL, FREET4, T3FREE, THYROIDAB in the last 72 hours. ? ?Anemia Panel: ?No results for input(s): VITAMINB12, FOLATE, FERRITIN, TIBC, IRON, RETICCTPCT in the last 72 hours. ?Sepsis Labs: ?Recent Labs  ?Lab 05/17/21 ?1110 05/17/21 ?1308 05/20/21 ?0138 05/20/21 ?1905  ?LATICACIDVEN 2.4* 1.6 2.4* 1.7  ? ? ?Recent Results (from the past 240 hour(s))  ?Blood Culture (routine x 2)     Status: Abnormal  ? Collection Time: 05/17/21 11:10 AM  ? Specimen: BLOOD  ?Result Value Ref Range Status  ? Specimen Description   Final  ?  BLOOD RIGHT ANTECUBITAL ?Performed at [facility], [address] [place], [place] [address] ?  ? Special Requests   Final  ?  BOTTLES DRAWN AEROBIC AND ANAEROBIC Blood Culture adequate  volume ?Performed at [facility], [address] [place], [place] [address] ?  ? Culture  Setup Time   Final  ?  GRAM POSITIVE COCCI ?AEROBIC BOTTLE ONLY ?Organism ID to follow ?CRITICAL RESULT CALLED TO, READ BACK BY AND VERIFIED WITH: MORGAN HICKS 05/18/21 1036 MW ?  ? Culture (A)  Final  ?  STAPHYLOCOCCUS CAPITIS ?THE SIGNIFICANCE OF ISOLATING THIS ORGANISM FROM A SINGLE SET OF BLOOD CULTURES WHEN MULTIPLE SETS ARE DRAWN IS UNCERTAIN. PLEASE NOTIFY THE MICROBIOLOGY DEPARTMENT WITHIN ONE WEEK IF SPECIATION AND SENSITIVITIES ARE REQUIRED. ?Performed at [facility], [place] [address], [place], [place] [address] ?  ? Report Status 05/20/2021 FINAL  Final  ?Blood Culture (routine x 2)     Status: None (Preliminary result)  ? Collection Time: 05/17/21  11:10 AM  ? Specimen: BLOOD  ?Result Value Ref Range Status  ? Specimen Description BLOOD RIGHT FA  Final  ? Special Requests   Final  ?  BOTTLES DRAWN AEROBIC AND ANAEROBIC Blood Culture results may not be optimal due to an inadequate volume of blood received in culture bottles  ? Culture   Final  ?  NO GROWTH 4 DAYS ?Performed at [facility], [address], [place], [place] [address] ?  ? Report Status PENDING  Incomplete  ?Resp Panel by RT-PCR (Flu A&B, Covid) Nasopharyngeal Swab     Status: None  ? Collection Time: 05/17/21 11:10 AM  ? Specimen: Nasopharyngeal Swab; Nasopharyngeal(NP) swabs in vial transport medium  ?Result Value Ref Range Status  ? SARS Coronavirus 2 by RT PCR NEGATIVE NEGATIVE Final  ?  Comment: (NOTE) ?SARS-CoV-2 target nucleic acids are NOT DETECTED. ? ?The SARS-CoV-2 RNA is generally detectable in upper respiratory ?specimens during the acute phase of infection. The lowest ?concentration of SARS-CoV-2 viral copies this assay can detect is ?138 copies/mL. A negative result does not preclude SARS-Cov-2 ?infection and should not be used as the sole basis for treatment or ?other patient management decisions. A negative  result may occur with  ?improper specimen collection/handling, submission of specimen other ?than nasopharyngeal swab, presence of viral mutation(s) within the ?areas targeted by this assay, and inadequate number o

## 2021-05-23 DIAGNOSIS — F0390 Unspecified dementia without behavioral disturbance: Secondary | ICD-10-CM | POA: Diagnosis not present

## 2021-05-23 DIAGNOSIS — J189 Pneumonia, unspecified organism: Secondary | ICD-10-CM | POA: Diagnosis not present

## 2021-05-23 DIAGNOSIS — J9601 Acute respiratory failure with hypoxia: Secondary | ICD-10-CM | POA: Diagnosis not present

## 2021-05-23 LAB — BASIC METABOLIC PANEL
Anion gap: 8 (ref 5–15)
BUN: 28 mg/dL — ABNORMAL HIGH (ref 8–23)
CO2: 31 mmol/L (ref 22–32)
Calcium: 8 mg/dL — ABNORMAL LOW (ref 8.9–10.3)
Chloride: 106 mmol/L (ref 98–111)
Creatinine, Ser: 1.72 mg/dL — ABNORMAL HIGH (ref 0.44–1.00)
GFR, Estimated: 33 mL/min — ABNORMAL LOW (ref >60)
Glucose, Bld: 191 mg/dL — ABNORMAL HIGH (ref 70–99)
Potassium: 4 mmol/L (ref 3.5–5.1)
Sodium: 145 mmol/L (ref 135–145)

## 2021-05-23 LAB — CBC
HCT: 33.9 % — ABNORMAL LOW (ref 36.0–46.0)
Hemoglobin: 9.9 g/dL — ABNORMAL LOW (ref 12.0–15.0)
MCH: 29.5 pg (ref 26.0–34.0)
MCHC: 29.2 g/dL — ABNORMAL LOW (ref 30.0–36.0)
MCV: 100.9 fL — ABNORMAL HIGH (ref 80.0–100.0)
Platelets: 171 10*3/uL (ref 150–400)
RBC: 3.36 MIL/uL — ABNORMAL LOW (ref 3.87–5.11)
RDW: 16.8 % — ABNORMAL HIGH (ref 11.5–15.5)
WBC: 9.6 10*3/uL (ref 4.0–10.5)
nRBC: 0.2 % (ref 0.0–0.2)

## 2021-05-23 LAB — GLUCOSE, CAPILLARY: Glucose-Capillary: 129 mg/dL — ABNORMAL HIGH (ref 70–99)

## 2021-05-23 MED ORDER — METHYLPREDNISOLONE SODIUM SUCC 125 MG IJ SOLR
60.0000 mg | INTRAMUSCULAR | Status: DC
  Administered 2021-05-23 – 2021-05-25 (×3): 60 mg via INTRAVENOUS
  Filled 2021-05-23 (×3): qty 2

## 2021-05-23 NOTE — Progress Notes (Addendum)
?PROGRESS NOTE ? ? ? ?Lori [facility]  JOA:[id number] DOB: [birth year] DOA: 05/17/2021 ?PCP: [facility]  ? ? ?Assessment & Plan: ?  ?Principal Problem: ?  Acute respiratory failure with hypoxia ([place]) ?Active Problems: ?  Chronic diastolic CHF (congestive heart failure) ([place]) ?  Hypothyroid ?  Mixed Alzheimer's and vascular dementia ([place]) ?  Obesity, Class III, BMI 40-49.9 (morbid obesity) ([place]) ?  Pneumonia ?  Dementia ([place]) ?  HLD (hyperlipidemia) ?  Sepsis ([place]) ?  Respiratory failure ([place]) ? ?Assessment and Plan: ? ?  ?Acute hypoxic respiratory failure: intermittently on BiPAP. Continue on supplemental oxygen and wean as tolerated.  ?  ?Mild metabolic encephalopathy: likely secondary to pneumonia. Has underlying dementia & Down's syndrome. T head shows no acute intracranial abnormalities  ?  ?Sepsis: met criteria w/ tachycardia, tachypnea, fever, leukocytosis & secondary to multifocal pneumonia & UTI. Abxs changed to IV azithromycin, ceftriaxone. Sepsis resolved  ? ?Multifocal pneumonia: slight improvement from day prior. Continue on on IV zosyn (started 5/7) and azithromycin. D/c IV rocephin. Increased IV steroids and continue on bronchodilators. CT chest shows b/l opacities concerning for pneumonia vs possible edema ? ?AKI: baseline Cr/GFR is unknown. Cr is trending up again today. Hold lasix  ? ?Hypernatremia: WNL today  ? ?UTI: w/ hx left ureteral stent in the past with distal left ureteral stone with persistent mild left hydroureteronephrosis. Urine cx growing yeast, completed fluconazole course. Scheduled on 05/20/21 for urethral stone removal outpatient but this will need to be rescheduled  ? ?Unlikely bacteremia: blood cxs growing stap species, likely containment. Repeat blood cxs NGTD  ?  ?Dementia: w/ hx of down's syndrome. Poor historian. Continue on home dose of keppra  ? ?Likely chronic diastolic CHF: echo from 06/17/930 showed EF 55 to 60% with no regional wall motion  abnormality. ?  ?HLD: continue on statin    ?  ?Hypothyroidism: continue on home dose of synthroid  ? ?Thrombocytopenia: WNL again today  ? ? ? ? ?DVT prophylaxis: heparin  ?Code Status: full  ?Family Communication: ?Disposition Plan: likely d/c back to [facility]  ? ?Level of care: Progressive ? ?Status is: Inpatient ?Remains inpatient appropriate because: severity of illness ? ? ?Consultants:  ? ? ?Procedures: ? ?Antimicrobials: zosyn, azithromycin  ? ? ?Subjective: ?Pt appears slightly more comfortable today  ? ?Objective: ?Vitals:  ? 05/22/21 2337 05/23/21 0421 05/23/21 0535 05/23/21 0740  ?BP: 116/70 125/74  102/70  ?Pulse: (!) 55 63  82  ?Resp: (!) 23 (!) 22    ?Temp: 98.4 ?F (36.9 ?C) 98.8 ?F (37.1 ?C)    ?TempSrc: Oral Oral    ?SpO2: 100% 100%  96%  ?Weight:   95 kg   ?Height:      ? ? ?Intake/Output Summary (Last 24 hours) at 05/23/2021 0744 ?Last data filed at 05/23/2021 0600 ?Gross per 24 hour  ?Intake 640.04 ml  ?Output 200 ml  ?Net 440.04 ml  ? ?Filed Weights  ? 05/21/21 0530 05/22/21 0500 05/23/21 0535  ?Weight: 97.7 kg 96 kg 95 kg  ? ? ?Examination: ? ?General exam: Appears calm. Obese  ?Respiratory system: diminished breath sounds b/l. Wheezes b/l  ?Cardiovascular system: S1/S2+. No rubs or gallops  ?Gastrointestinal system: Abd is soft, NT, obese & hypoactive bowel sounds   ?Central nervous system: Alert and awake.  ?Psychiatry: judgement and insight appears poor. Flat mood and affect  ? ? ? ?Data Reviewed: I have personally reviewed following labs and imaging studies ? ?CBC: ?Recent  Labs  ?Lab 05/17/21 ?1110 05/18/21 ?4431 05/19/21 ?1011 05/20/21 ?0138 05/21/21 ?5400 05/22/21 ?8676 05/23/21 ?0357  ?WBC 10.6*   < > 15.5* 14.0* 10.8* 10.2 9.6  ?NEUTROABS 8.5*  --   --   --   --   --   --   ?HGB 12.5   < > 11.5* 10.7* 9.4* 8.7* 9.9*  ?HCT 42.5   < > 38.0 36.6 31.9* 29.7* 33.9*  ?MCV 100.2*   < > 99.2 99.7 98.5 99.0 100.9*  ?PLT 221   < > 168 100* 147* 175 171  ? < > = values in this interval not displayed.   ? ?Basic Metabolic Panel: ?Recent Labs  ?Lab 05/18/21 ?1950 05/19/21 ?1011 05/20/21 ?0138 05/21/21 ?9326 05/22/21 ?7124 05/23/21 ?0357  ?NA 144 142 142 143 149* 145  ?K 3.5 4.3 3.8 3.4* 3.5 4.0  ?CL 116* 109 110 109 113* 106  ?CO2 18* 23 21* 26 30 31   ?GLUCOSE 122* 131* 183* 104* 137* 191*  ?BUN 15 21 27* 24* 26* 28*  ?CREATININE 0.87 1.33* 1.64* 1.56* 1.63* 1.72*  ?CALCIUM 6.9* 8.0* 7.9* 8.0* 8.1* 8.0*  ?MG 1.9  --   --   --   --   --   ? ?GFR: ?Estimated Creatinine Clearance: 39.3 mL/min (A) (by C-G formula based on SCr of 1.72 mg/dL (H)). ?Liver Function Tests: ?Recent Labs  ?Lab 05/17/21 ?1110 05/18/21 ?0540  ?AST 18 17  ?ALT 9 6  ?ALKPHOS 46 33*  ?BILITOT 0.6 0.4  ?PROT 7.7 6.6  ?ALBUMIN 3.1* 2.6*  ? ?No results for input(s): LIPASE, AMYLASE in the last 168 hours. ?No results for input(s): AMMONIA in the last 168 hours. ?Coagulation Profile: ?Recent Labs  ?Lab 05/17/21 ?1110  ?INR 1.1  ? ?Cardiac Enzymes: ?No results for input(s): CKTOTAL, CKMB, CKMBINDEX, TROPONINI in the last 168 hours. ?BNP (last 3 results) ?No results for input(s): PROBNP in the last 8760 hours. ?HbA1C: ?No results for input(s): HGBA1C in the last 72 hours. ? ?CBG: ?Recent Labs  ?Lab 05/19/21 ?5809 05/20/21 ?0305 05/20/21 ?9833 05/21/21 ?8250 05/22/21 ?5397  ?GLUCAP 142* 163* 107* 111* 112*  ? ?Lipid Profile: ?No results for input(s): CHOL, HDL, LDLCALC, TRIG, CHOLHDL, LDLDIRECT in the last 72 hours. ?Thyroid Function Tests: ?No results for input(s): TSH, T4TOTAL, FREET4, T3FREE, THYROIDAB in the last 72 hours. ? ?Anemia Panel: ?No results for input(s): VITAMINB12, FOLATE, FERRITIN, TIBC, IRON, RETICCTPCT in the last 72 hours. ?Sepsis Labs: ?Recent Labs  ?Lab 05/17/21 ?1110 05/17/21 ?1308 05/20/21 ?0138 05/20/21 ?1905  ?LATICACIDVEN 2.4* 1.6 2.4* 1.7  ? ? ?Recent Results (from the past 240 hour(s))  ?Blood Culture (routine x 2)     Status: Abnormal  ? Collection Time: 05/17/21 11:10 AM  ? Specimen: BLOOD  ?Result Value Ref Range Status  ?  Specimen Description   Final  ?  BLOOD RIGHT ANTECUBITAL ?Performed at [facility], [address], [place], [place] [address] ?  ? Special Requests   Final  ?  BOTTLES DRAWN AEROBIC AND ANAEROBIC Blood Culture adequate volume ?Performed at [facility], [address], [place], [place] [address] ?  ? Culture  Setup Time   Final  ?  GRAM POSITIVE COCCI ?AEROBIC BOTTLE ONLY ?Organism ID to follow ?CRITICAL RESULT CALLED TO, READ BACK BY AND VERIFIED WITH: MORGAN HICKS 05/18/21 1036 MW ?  ? Culture (A)  Final  ?  STAPHYLOCOCCUS CAPITIS ?THE SIGNIFICANCE OF ISOLATING THIS ORGANISM FROM A SINGLE SET OF BLOOD CULTURES  WHEN MULTIPLE SETS ARE DRAWN IS UNCERTAIN. PLEASE NOTIFY THE MICROBIOLOGY DEPARTMENT WITHIN ONE WEEK IF SPECIATION AND SENSITIVITIES ARE REQUIRED. ?Performed at [facility], [place] [address], [place], [place] [address] ?  ? Report Status 05/20/2021 FINAL  Final  ?Blood Culture (routine x 2)     Status: None (Preliminary result)  ? Collection Time: 05/17/21 11:10 AM  ? Specimen: BLOOD  ?Result Value Ref Range Status  ? Specimen Description BLOOD RIGHT FA  Final  ? Special Requests   Final  ?  BOTTLES DRAWN AEROBIC AND ANAEROBIC Blood Culture results may not be optimal due to an inadequate volume of blood received in culture bottles  ? Culture   Final  ?  NO GROWTH 4 DAYS ?Performed at [facility], [address] [place],  [address] ?  ? Report Status PENDING  Incomplete  ?Resp Panel by RT-PCR (Flu A&B, Covid) Nasopharyngeal Swab     Status: None  ? Collection Time: 05/17/21 11:10 AM  ? Specimen: Nasopharyngeal Swab; Nasopharyngeal(NP) swabs in vial transport medium  ?Result Value Ref Range Status  ? SARS Coronavirus 2 by RT PCR NEGATIVE NEGATIVE Final  ?  Comment: (NOTE) ?SARS-CoV-2 target nucleic acids are NOT DETECTED. ? ?The SARS-CoV-2 RNA is generally detectable in upper respiratory ?specimens during the acute phase of infection. The lowest ?concentration of  SARS-CoV-2 viral copies this assay can detect is ?138 copies/mL. A negative result does not preclude SARS-Cov-2 ?infection and should not be used as the sole basis for treatment or ?other patient management decisions. A

## 2021-05-23 NOTE — Progress Notes (Signed)
Nutrition Brief Note ? ?RD seen per request of SLP.  ? ?Wt Readings from Last 15 Encounters:  ?05/23/21 95 kg  ?03/24/21 99 kg  ?12/05/18 99.2 kg  ?11/04/18 99.8 kg  ?11/02/18 99.8 kg  ?01/04/18 87.5 kg  ?04/27/17 98.9 kg  ?04/24/16 92.5 kg  ?03/29/16 92.5 kg  ?12/09/12 95.7 kg  ? ?Patient is a 66 years old female with past medical history of Down syndrome, heart failure, hypertension, dementia, hyperlipidemia presented to hospital with complaints of cough and shortness of breath.   ? ?Pt admitted with acute respiratory failure and mild metabolic encephalopathy.  ? ?5/1- s/p BSE- advanced to dysphagia 1 diet with thin liquids ? ?Case discussed with SLP. Pt can be impulsive with eating. She requires supervision with feeding.  ? ?Case discussed with RN. Pt eating well and taking medications without difficulty.  ? ?Pt lying in bed at time of visit. Answered questions with "ok" and smiled and giggled at this RD when greeted.  ? ?Nutrition-Focused physical exam completed. Findings are no fat depletion, no muscle depletion, and mild edema.   ? ?Medications reviewed and include vitamin D3, keppra, and solu-medrol.  ? ?Body mass index is 31.84 kg/m?Marland Kitchen Patient meets criteria for obesity, class I based on current BMI.  ? ?Current diet order is dysphagia 1 with thin liquids, patient is consuming approximately 50-100% of meals at this time. Labs and medications reviewed.  ? ?No nutrition interventions warranted at this time. If nutrition issues arise, please consult RD.  ? ?Levada Schilling, RD, LDN, CDCES ?Registered Dietitian II ?Certified Diabetes Care and Education Specialist ?Please refer to [facility] for RD and/or RD on-call/weekend/after hours pager   ?

## 2021-05-23 NOTE — Care Management Important Message (Signed)
Important Message ? ?Patient Details  ?Name: Tammara Massing [facility] ?MRN: [id number] ?Date of Birth: 1955-05-18 ? ? ?Medicare Important Message Given:  Yes ? ?Attempted to reach legal guardian, [name], though mailbox is full so unable to leave message.  Copy of Medicare IM left in patient's room for reference.   ? ? ?[name] ?05/23/2021, 12:28 PM ?

## 2021-05-24 DIAGNOSIS — J189 Pneumonia, unspecified organism: Secondary | ICD-10-CM | POA: Diagnosis not present

## 2021-05-24 DIAGNOSIS — J9601 Acute respiratory failure with hypoxia: Secondary | ICD-10-CM | POA: Diagnosis not present

## 2021-05-24 DIAGNOSIS — F0390 Unspecified dementia without behavioral disturbance: Secondary | ICD-10-CM | POA: Diagnosis not present

## 2021-05-24 LAB — CBC
HCT: 34 % — ABNORMAL LOW (ref 36.0–46.0)
Hemoglobin: 10.1 g/dL — ABNORMAL LOW (ref 12.0–15.0)
MCH: 29.5 pg (ref 26.0–34.0)
MCHC: 29.7 g/dL — ABNORMAL LOW (ref 30.0–36.0)
MCV: 99.4 fL (ref 80.0–100.0)
Platelets: 194 10*3/uL (ref 150–400)
RBC: 3.42 MIL/uL — ABNORMAL LOW (ref 3.87–5.11)
RDW: 16.9 % — ABNORMAL HIGH (ref 11.5–15.5)
WBC: 8.4 10*3/uL (ref 4.0–10.5)
nRBC: 0 % (ref 0.0–0.2)

## 2021-05-24 LAB — BASIC METABOLIC PANEL
Anion gap: 6 (ref 5–15)
BUN: 34 mg/dL — ABNORMAL HIGH (ref 8–23)
CO2: 31 mmol/L (ref 22–32)
Calcium: 8.3 mg/dL — ABNORMAL LOW (ref 8.9–10.3)
Chloride: 107 mmol/L (ref 98–111)
Creatinine, Ser: 1.59 mg/dL — ABNORMAL HIGH (ref 0.44–1.00)
GFR, Estimated: 36 mL/min — ABNORMAL LOW (ref >60)
Glucose, Bld: 171 mg/dL — ABNORMAL HIGH (ref 70–99)
Potassium: 3.8 mmol/L (ref 3.5–5.1)
Sodium: 144 mmol/L (ref 135–145)

## 2021-05-24 LAB — CULTURE, BLOOD (ROUTINE X 2)
Culture: NO GROWTH
Culture: NO GROWTH
Culture: NO GROWTH

## 2021-05-24 LAB — VITAMIN B12: Vitamin B-12: 734 pg/mL (ref 180–914)

## 2021-05-24 LAB — GLUCOSE, CAPILLARY: Glucose-Capillary: 146 mg/dL — ABNORMAL HIGH (ref 70–99)

## 2021-05-24 LAB — FOLATE: Folate: 9.8 ng/mL (ref >5.9)

## 2021-05-24 MED ORDER — ORAL CARE MOUTH RINSE
15.0000 mL | Freq: Two times a day (BID) | OROMUCOSAL | Status: DC
  Administered 2021-05-24 – 2021-05-30 (×13): 15 mL via OROMUCOSAL

## 2021-05-24 MED ORDER — CHLORHEXIDINE GLUCONATE 0.12 % MT SOLN
15.0000 mL | Freq: Two times a day (BID) | OROMUCOSAL | Status: DC
  Administered 2021-05-24 – 2021-05-30 (×13): 15 mL via OROMUCOSAL
  Filled 2021-05-24 (×12): qty 15

## 2021-05-24 NOTE — Progress Notes (Signed)
Physical Therapy Treatment ?Patient Details ?Name: Lori Petersen ?MRN: [id number] ?DOB: 03-24-1955 ?Today's Date: 05/24/2021 ? ? ?History of Present Illness Pt is a 66 y/o F admitted on 05/17/21 after presenting to the ED with c/o cough & SOB. Pt was initially on nonrebreather mask but later placed on bipap. Pt is being treated for acute hypoxic respiratory failure & mild metabolic encephalopathy. PMH: down syndrome, heart failure, HTN, dementia, HLD ? ?  ?PT Comments  ? ? Patient seen for PT treatment. Total assistance required to attempt sitting up on edge of bed. Patient achieved partial sitting position but further mobility deferred due to poor effort. Rolling performed in bed with maximal assistance and maximal cues for technique and task initiation. She is minimally participatory in LE exercises in bed as well for strengthening. Slow progress overall. Recommend to continue PT as patient is able to participate. SNF recommended at discharge.  ?  ?Recommendations for follow up therapy are one component of a multi-disciplinary discharge planning process, led by the attending physician.  Recommendations may be updated based on patient status, additional functional criteria and insurance authorization. ? ?Follow Up Recommendations ? Skilled nursing-short term rehab (<3 hours/day) ?  ?  ?Assistance Recommended at Discharge Frequent or constant Supervision/Assistance  ?Patient can return home with the following Two people to help with walking and/or transfers;Two people to help with bathing/dressing/bathroom;Direct supervision/assist for medications management;Help with stairs or ramp for entrance;Assistance with feeding;Assist for transportation;Direct supervision/assist for financial management;Assistance with cooking/housework ?  ?Equipment Recommendations ? None recommended by PT  ?  ?Recommendations for Other Services   ? ? ?  ?Precautions / Restrictions Precautions ?Precautions: Fall ?Restrictions ?Weight Bearing  Restrictions: No  ?  ? ?Mobility ? Bed Mobility ?Overal bed mobility: Needs Assistance ?Bed Mobility: Supine to Sit, Rolling ?Rolling: Max assist ?  ?Supine to sit: Total assist (for partial sitting) ?  ?  ?General bed mobility comments: assisted patient to partial sitting position with maximal cues for task initiaion and participation. minimally participatory with bed mobility efforts. rolling performed with maximal assistance and maximal cues for technique. further mobility deferred due to patient participation ?  ? ?Transfers ?  ?  ?  ?  ?  ?  ?  ?  ?  ?  ?  ? ?Ambulation/Gait ?  ?  ?  ?  ?  ?  ?  ?  ? ? ?Stairs ?  ?  ?  ?  ?  ? ? ?Wheelchair Mobility ?  ? ?Modified Rankin (Stroke Patients Only) ?  ? ? ?  ?Balance   ?  ?  ?  ?  ?  ?  ?  ?  ?  ?  ?  ?  ?  ?  ?  ?  ?  ?  ?  ? ?  ?Cognition Arousal/Alertness: Awake/alert ?Behavior During Therapy: Flat affect ?Overall Cognitive Status: History of cognitive impairments - at baseline ?  ?  ?  ?  ?  ?  ?  ?  ?  ?  ?  ?  ?  ?  ?  ?  ?General Comments: patient has difficulty following single step commands ?  ?  ? ?  ?Exercises General Exercises - Lower Extremity ?Ankle Circles/Pumps: AAROM, Strengthening, Both, 10 reps, PROM, Supine ?Heel Slides: AAROM, PROM, Strengthening, Both, 10 reps, Supine ?Hip ABduction/ADduction: PROM, AAROM, Strengthening, Both, 10 reps, Supine ?Other Exercises ?Other Exercises: verbal and tactile cues for participation ? ?  ?General Comments  General comments (skin integrity, edema, etc.): no significant change in vitals noted with activity ?  ?  ? ?Pertinent Vitals/Pain Pain Assessment ?Pain Assessment: No/denies pain  ? ? ?Home Living   ?  ?  ?  ?  ?  ?  ?  ?  ?  ?   ?  ?Prior Function    ?  ?  ?   ? ?PT Goals (current goals can now be found in the care plan section) Acute Rehab PT Goals ?Patient Stated Goal: none stated ?PT Goal Formulation: Patient unable to participate in goal setting ?Time For Goal Achievement: 06/04/21 ?Progress towards PT  goals: Progressing toward goals ? ?  ?Frequency ? ? ? Min 2X/week ? ? ? ?  ?PT Plan Current plan remains appropriate  ? ? ?Co-evaluation   ?  ?  ?  ?  ? ?  ?AM-PAC PT "6 Clicks" Mobility   ?Outcome Measure ? Help needed turning from your back to your side while in a flat bed without using bedrails?: Total ?Help needed moving from lying on your back to sitting on the side of a flat bed without using bedrails?: Total ?Help needed moving to and from a bed to a chair (including a wheelchair)?: Total ?Help needed standing up from a chair using your arms (e.g., wheelchair or bedside chair)?: Total ?Help needed to walk in hospital room?: Total ?Help needed climbing 3-5 steps with a railing? : Total ?6 Click Score: 6 ? ?  ?End of Session Equipment Utilized During Treatment: Oxygen ?Activity Tolerance: Patient tolerated treatment well ?Patient left: in bed;with call bell/phone within reach;with bed alarm set ?  ?PT Visit Diagnosis: Muscle weakness (generalized) (M62.81);Difficulty in walking, not elsewhere classified (R26.2);Other abnormalities of gait and mobility (R26.89) ?  ? ? ?Time: 7026-3785 ?PT Time Calculation (min) (ACUTE ONLY): 13 min ? ?Charges:  $Therapeutic Exercise: 8-22 mins          ?          ? ?Donna Bernard, PT, MPT ? ? ? ?Lori Petersen ?05/24/2021, 12:56 PM ? ?

## 2021-05-24 NOTE — Progress Notes (Signed)
?PROGRESS NOTE ? ? ?HPI was taken from Dr. Louanne Petersen: ?Lori Petersen is a 66 y.o. female Patient is a 66 years old female with past medical history of Down syndrome, heart failure, hypertension, dementia, hyperlipidemia presented to [facility] with complaints of cough and shortness of breath.  Of note, patient was recently seen by her primary care physician as outpatient.  Initially patient had presented with some stuffy runny nose subsequently she had increasing shortness of breath.  She was noted to be hypoxic and EMS was called in.  Patient required nonrebreather mask in route to the [facility].  History is extremely limited due to patient's underlying dementia and Down syndrome and family not available.  ED provider had spoken with the PCP .  Patient has been followed by [facility].   No prior history of COPD. ?  ?In the ED, patient was tachycardic, tachypneic with a temperature of 100.2 ?F, lactate was elevated at 2.4 and had leukocytosis at 13.6.  Creatinine mildly elevated at 1.4.  She was in respiratory distress and was on nonrebreather mask initially but due to  had increased work of breathing with diffuse wheezing, she was subsequently placed on BiPAP.  EKG showed sinus tachycardia.  Chest-ray however did not show any focal abnormality.  Urinalysis showed turbid urine with significant glucose and protein and large leukocytes and WBC more than 50. Blood cultures were sent from the ED including urinalysis.  Code sepsis was activated in the ED.  COVID and influenza was negative.  Patient received IV fluid bolus followed by vancomycin and cefepime, IV Solu-Medrol 125 mg x 1. Patient was then considered for admission to the [facility] for possible sepsis secondary to pneumonia with hypoxic respiratory failure. ? ? ?As per Dr. Jimmye Petersen 5/3-05/24/21: Pt presented w/ shortness of breath found to be likely secondary to multifocal pneumonia. Pt was initially put on IV vanco, cefepime, bronchodilators,incentive  spirometry & supplemental oxygen. Pt started to make improvements so abxs were changed to IV rocephin, azithromycin. Pt's respiratory status then took a turn for the worse and pt was transferred to stepdown for increased work of breathing and was placed on BiPAP. Pt's respiratory status did not improve much with BiPAP, so lasix was given intermittently and then abxs were changed to IV zosyn & azithromycin was continue. IV steroids were also added. Pt's respiratory and mental status have improved over the last 24 hrs.  ? ? ?Lori Petersen [facility]  [name] DOB: 1955/02/13 DOA: 05/17/2021 ?PCP: [facility] [place]  ? ? ?Assessment & Plan: ?  ?Principal Problem: ?  Acute respiratory failure with hypoxia ([place]) ?Active Problems: ?  Chronic diastolic CHF (congestive heart failure) ([place]) ?  Hypothyroid ?  Mixed Alzheimer's and vascular dementia ([place]) ?  Obesity, Class III, BMI 40-49.9 (morbid obesity) ([place]) ?  Pneumonia ?  Dementia ([place]) ?  HLD (hyperlipidemia) ?  Sepsis ([place]) ?  Respiratory failure ([place]) ? ?Assessment and Plan: ? ?  ?Acute hypoxic respiratory failure: intermittently on BiPAP. Continue on supplemental oxygen and wean as tolerated.  ? ?  ?Mild metabolic encephalopathy: likely secondary to pneumonia. Has underlying dementia & Down's syndrome. CT head shows no acute intracranial abnormalities. Improving today  ?  ?Sepsis: met criteria w/ tachycardia, tachypnea, fever, leukocytosis & secondary to multifocal pneumonia & UTI. Abxs changed to IV azithromycin, ceftriaxone. Sepsis resolved  ? ?Multifocal pneumonia: improving today. Continue on IV zosyn (started 5/7 & azithromycin, IV steroids & bronchodilators. D/c IV rocephin.  ? ?AKI: baseline  Cr/GFR is unknown Cr is trending down from day prior  ? ?Hypernatremia: resolved  ? ?UTI: w/ hx left ureteral stent in the past with distal left ureteral stone with persistent mild left hydroureteronephrosis. Urine cx growing yeast, completed fluconazole course.  Scheduled on 05/20/21 for urethral stone removal outpatient but this will need to be rescheduled  ? ?Unlikely bacteremia: blood cxs growing stap species, likely containment. Repeat blood cxs NGTD  ?  ?Dementia: w/ hx of down's syndrome. Poor historian. Continue on home dose of keppra  ? ?Likely chronic diastolic CHF: echo from 2/77/8242 showed EF 55 to 60% with no regional wall motion abnormality. ?  ?HLD: continue on statin   ?  ?Hypothyroidism: continue on home dose of levothyroxine  ? ?Thrombocytopenia: resolved  ? ? ? ? ?DVT prophylaxis: heparin  ?Code Status: full  ?Family Communication: ?Disposition Plan: likely d/c back to [facility]  ? ?Level of care: Progressive ? ?Status is: Inpatient ?Remains inpatient appropriate because: severity of illness, improving today  ? ? ?Consultants:  ? ? ?Procedures: ? ?Antimicrobials: zosyn, azithromycin  ? ? ?Subjective: ?Pt appears alert & awake.  ? ?Objective: ?Vitals:  ? 05/24/21 0335 05/24/21 0500 05/24/21 3536 05/24/21 0757  ?BP: (!) 128/52   (!) 145/80  ?Pulse: (!) 42     ?Resp: 20   18  ?Temp: 97.6 ?F (36.4 ?C)   97.7 ?F (36.5 ?C)  ?TempSrc: Axillary   Axillary  ?SpO2: 100%   97%  ?Weight:  98.4 kg 97.9 kg   ?Height:      ? ? ?Intake/Output Summary (Last 24 hours) at 05/24/2021 0804 ?Last data filed at 05/24/2021 0759 ?Gross per 24 hour  ?Intake 852.97 ml  ?Output 500 ml  ?Net 352.97 ml  ? ?Filed Weights  ? 05/23/21 0535 05/24/21 0500 05/24/21 0639  ?Weight: 95 kg 98.4 kg 97.9 kg  ? ? ?Examination: ? ?General exam: Appears calm & comfortable  ?Respiratory system: decreased breath sounds b/l  ?Cardiovascular system: S1 & S2+. No rubs or clicks  ?Gastrointestinal system: Abd is soft, NT, obese & hypoactive bowel sounds    ?Central nervous system: Alert and awake.  ?Psychiatry: judgement and insight appears at baseline. Flat mood and affect  ? ? ? ?Data Reviewed: I have personally reviewed following labs and imaging studies ? ?CBC: ?Recent Labs  ?Lab 05/17/21 ?1110 05/18/21 ?1443  05/19/21 ?1011 05/20/21 ?0138 05/21/21 ?1540 05/22/21 ?0867 05/23/21 ?0357  ?WBC 10.6*   < > 15.5* 14.0* 10.8* 10.2 9.6  ?NEUTROABS 8.5*  --   --   --   --   --   --   ?HGB 12.5   < > 11.5* 10.7* 9.4* 8.7* 9.9*  ?HCT 42.5   < > 38.0 36.6 31.9* 29.7* 33.9*  ?MCV 100.2*   < > 99.2 99.7 98.5 99.0 100.9*  ?PLT 221   < > 168 100* 147* 175 171  ? < > = values in this interval not displayed.  ? ?Basic Metabolic Panel: ?Recent Labs  ?Lab 05/18/21 ?6195 05/19/21 ?1011 05/20/21 ?0138 05/21/21 ?0932 05/22/21 ?6712 05/23/21 ?0357  ?NA 144 142 142 143 149* 145  ?K 3.5 4.3 3.8 3.4* 3.5 4.0  ?CL 116* 109 110 109 113* 106  ?CO2 18* 23 21* 26 30 31   ?GLUCOSE 122* 131* 183* 104* 137* 191*  ?BUN 15 21 27* 24* 26* 28*  ?CREATININE 0.87 1.33* 1.64* 1.56* 1.63* 1.72*  ?CALCIUM 6.9* 8.0* 7.9* 8.0* 8.1* 8.0*  ?MG 1.9  --   --   --   --   --   ? ?  GFR: ?Estimated Creatinine Clearance: 39.9 mL/min (A) (by C-G formula based on SCr of 1.72 mg/dL (H)). ?Liver Function Tests: ?Recent Labs  ?Lab 05/17/21 ?1110 05/18/21 ?0540  ?AST 18 17  ?ALT 9 6  ?ALKPHOS 46 33*  ?BILITOT 0.6 0.4  ?PROT 7.7 6.6  ?ALBUMIN 3.1* 2.6*  ? ?No results for input(s): LIPASE, AMYLASE in the last 168 hours. ?No results for input(s): AMMONIA in the last 168 hours. ?Coagulation Profile: ?Recent Labs  ?Lab 05/17/21 ?1110  ?INR 1.1  ? ?Cardiac Enzymes: ?No results for input(s): CKTOTAL, CKMB, CKMBINDEX, TROPONINI in the last 168 hours. ?BNP (last 3 results) ?No results for input(s): PROBNP in the last 8760 hours. ?HbA1C: ?No results for input(s): HGBA1C in the last 72 hours. ? ?CBG: ?Recent Labs  ?Lab 05/20/21 ?0569 05/21/21 ?0756 05/22/21 ?7948 05/23/21 ?0165 05/24/21 ?5374  ?GLUCAP 107* 111* 112* 129* 146*  ? ?Lipid Profile: ?No results for input(s): CHOL, HDL, LDLCALC, TRIG, CHOLHDL, LDLDIRECT in the last 72 hours. ?Thyroid Function Tests: ?No results for input(s): TSH, T4TOTAL, FREET4, T3FREE, THYROIDAB in the last 72 hours. ? ?Anemia Panel: ?Recent Labs  ?  05/24/21 ?0631   ?FOLATE 9.8  ? ?Sepsis Labs: ?Recent Labs  ?Lab 05/17/21 ?1110 05/17/21 ?1308 05/20/21 ?0138 05/20/21 ?1905  ?LATICACIDVEN 2.4* 1.6 2.4* 1.7  ? ? ?Recent Results (from the past 240 hour(s))  ?Blood Culture (

## 2021-05-25 ENCOUNTER — Inpatient Hospital Stay: Payer: Medicare (Managed Care)

## 2021-05-25 DIAGNOSIS — J9601 Acute respiratory failure with hypoxia: Secondary | ICD-10-CM | POA: Diagnosis not present

## 2021-05-25 LAB — COMPREHENSIVE METABOLIC PANEL
ALT: 30 U/L (ref 0–44)
AST: 24 U/L (ref 15–41)
Albumin: 2.6 g/dL — ABNORMAL LOW (ref 3.5–5.0)
Alkaline Phosphatase: 50 U/L (ref 38–126)
Anion gap: 8 (ref 5–15)
BUN: 36 mg/dL — ABNORMAL HIGH (ref 8–23)
CO2: 26 mmol/L (ref 22–32)
Calcium: 8.4 mg/dL — ABNORMAL LOW (ref 8.9–10.3)
Chloride: 111 mmol/L (ref 98–111)
Creatinine, Ser: 1.49 mg/dL — ABNORMAL HIGH (ref 0.44–1.00)
GFR, Estimated: 39 mL/min — ABNORMAL LOW (ref >60)
Glucose, Bld: 145 mg/dL — ABNORMAL HIGH (ref 70–99)
Potassium: 3.9 mmol/L (ref 3.5–5.1)
Sodium: 145 mmol/L (ref 135–145)
Total Bilirubin: 0.2 mg/dL — ABNORMAL LOW (ref 0.3–1.2)
Total Protein: 7 g/dL (ref 6.5–8.1)

## 2021-05-25 LAB — CBC
HCT: 38.1 % (ref 36.0–46.0)
Hemoglobin: 11.1 g/dL — ABNORMAL LOW (ref 12.0–15.0)
MCH: 29.1 pg (ref 26.0–34.0)
MCHC: 29.1 g/dL — ABNORMAL LOW (ref 30.0–36.0)
MCV: 100 fL (ref 80.0–100.0)
Platelets: 174 10*3/uL (ref 150–400)
RBC: 3.81 MIL/uL — ABNORMAL LOW (ref 3.87–5.11)
RDW: 17.2 % — ABNORMAL HIGH (ref 11.5–15.5)
WBC: 9 10*3/uL (ref 4.0–10.5)
nRBC: 0.6 % — ABNORMAL HIGH (ref 0.0–0.2)

## 2021-05-25 LAB — GLUCOSE, CAPILLARY: Glucose-Capillary: 131 mg/dL — ABNORMAL HIGH (ref 70–99)

## 2021-05-25 MED ORDER — METHYLPREDNISOLONE SODIUM SUCC 40 MG IJ SOLR
40.0000 mg | Freq: Two times a day (BID) | INTRAMUSCULAR | Status: DC
Start: 2021-05-25 — End: 2021-05-27
  Administered 2021-05-25 – 2021-05-27 (×4): 40 mg via INTRAVENOUS
  Filled 2021-05-25 (×4): qty 1

## 2021-05-25 MED ORDER — GUAIFENESIN ER 600 MG PO TB12
600.0000 mg | ORAL_TABLET | Freq: Two times a day (BID) | ORAL | Status: DC
  Administered 2021-05-25 – 2021-05-27 (×4): 600 mg via ORAL
  Filled 2021-05-25 (×4): qty 1

## 2021-05-25 MED ORDER — HYDROCODONE BIT-HOMATROP MBR 5-1.5 MG/5ML PO SOLN: 5.0000 mL | Freq: Four times a day (QID) | ORAL | Status: DC | PRN

## 2021-05-25 NOTE — TOC Progression Note (Signed)
Transition of Care (TOC) - Progression Note  ? ? ?Patient Details  ?Name: Lori Petersen [facility] ?MRN: [id number] ?Date of Birth: [birth year] ? ?Transition of Care (TOC) CM/SW Contact  ?Gildardo Griffes, LCSW ?Phone Number: ?05/25/2021, 9:36 AM ? ?Clinical Narrative:    ? ?TOC continues to follow for patient's medical readiness to discharge back to [facility] where patient has been staying long term.  ? ?Patient's main contact and legal guardian Cousin,[name]  [phone]. [facility] active with [facility], Sheri contact information is [phone] or call [phone]. Sherri requested d/c update for [facility] to arrange transportation.   ?  ?At discharge [facility] will provide transport back to [facility] via wheel chair Zenaida Niece (call [phone]) when ready.  ?  ? ? ?Expected Discharge Plan: Skilled Nursing Facility ?Barriers to Discharge: Continued Medical Work up ? ?Expected Discharge Plan and Services ?Expected Discharge Plan: Skilled Nursing Facility ?  ?  ?  ?Living arrangements for the past 2 months: Skilled Nursing Facility ?                ?  ?  ?  ?  ?  ?  ?  ?  ?  ?  ? ? ?Social Determinants of Health (SDOH) Interventions ?  ? ?Readmission Risk Interventions ?   ? View : No data to display.  ?  ?  ?  ? ? ?

## 2021-05-25 NOTE — Progress Notes (Signed)
?PROGRESS NOTE ? ? ?HPI was taken from Dr. Louanne Belton: ?Lori Petersen [facility] is a 66 y.o. female Patient is a 66 years old female with past medical history of Down syndrome, heart failure, hypertension, dementia, hyperlipidemia presented to hospital with complaints of cough and shortness of breath.  Of note, patient was recently seen by her primary care physician as outpatient.  Initially patient had presented with some stuffy runny nose subsequently she had increasing shortness of breath.  She was noted to be hypoxic and EMS was called in.  Patient required nonrebreather mask in route to the hospital.  History is extremely limited due to patient's underlying dementia and Down syndrome and family not available.  ED provider had spoken with the PCP .  [facility].   No prior history of COPD. ?  ?In the ED, patient was tachycardic, tachypneic with a temperature of 100.2 ?F, lactate was elevated at 2.4 and had leukocytosis at 13.6.  Creatinine mildly elevated at 1.4.  She was in respiratory distress and was on nonrebreather mask initially but due to  had increased work of breathing with diffuse wheezing, she was subsequently placed on BiPAP.  EKG showed sinus tachycardia.  Chest-ray however did not show any focal abnormality.  Urinalysis showed turbid urine with significant glucose and protein and large leukocytes and WBC more than 50. Blood cultures were sent from the ED including urinalysis.  Code sepsis was activated in the ED.  COVID and influenza was negative.  Patient received IV fluid bolus followed by vancomycin and cefepime, IV Solu-Medrol 125 mg x 1. Patient was then considered for admission to the hospital for possible sepsis secondary to pneumonia with hypoxic respiratory failure. ? ? ?As per Dr. Jimmye Norman 5/3-05/24/21: Pt presented w/ shortness of breath found to be likely secondary to multifocal pneumonia. Pt was initially put on IV vanco, cefepime, bronchodilators,incentive  spirometry & supplemental oxygen. Pt started to make improvements so abxs were changed to IV rocephin, azithromycin. Pt's respiratory status then took a turn for the worse and pt was transferred to stepdown for increased work of breathing and was placed on BiPAP. Pt's respiratory status did not improve much with BiPAP, so lasix was given intermittently and then abxs were changed to IV zosyn & azithromycin was continue. IV steroids were also added. Pt's respiratory and mental status have improved over the last 24 hrs.  ? ? ?Lori Petersen [facility]  WVP:[id number] DOB: [birth year] DOA: 05/17/2021 ?[facility]P: [facility] [place]  ? ? ?Assessment & Plan: ?  ?Principal Problem: ?  Acute respiratory failure with hypoxia ([place]) ?Active Problems: ?  Chronic diastolic CHF (congestive heart failure) ([place]) ?  Hypothyroid ?  Mixed Alzheimer's and vascular dementia ([place]) ?  Obesity, Class III, BMI 40-49.9 (morbid obesity) ([place]) ?  Pneumonia ?  Dementia ([place]) ?  HLD (hyperlipidemia) ?  Sepsis ([place]) ?  Respiratory failure ([place]) ? ?Assessment and Plan: ? ?  ?Acute hypoxic respiratory failure: intermittently on BiPAP. Continue on supplemental oxygen and wean as tolerated.  ? ?  ?Mild metabolic encephalopathy: likely secondary to pneumonia. Has underlying dementia & Down's syndrome. CT head shows no acute intracranial abnormalities. Improving today  ?  ?Sepsis: met criteria w/ tachycardia, tachypnea, fever, leukocytosis & secondary to multifocal pneumonia & UTI. S/p IV azithromycin x 5 days, ceftriaxone x 3days. Sepsis resolved  ? ?Multifocal pneumonia: improving today. Continue on IV zosyn (started 5/7 & azithromycin, IV steroids & bronchodilators. D/c IV rocephin.  ?  Continue Zosyn for total 7 days ?5/10 increased IV Solu-Medrol 40 every 12 hourly due to persistent wheezing and shortness of breath ?Started Mucinex 600 twice daily, Hycodan as needed for cough ?Chest x-ray ? ? ?AKI: baseline Cr/GFR is unknown Cr is trending down from day  prior  ? ?Hypernatremia: resolved  ? ?UTI: w/ hx left ureteral stent in the past with distal left ureteral stone with persistent mild left hydroureteronephrosis. Urine cx growing yeast, completed fluconazole course. Scheduled on 05/20/21 for urethral stone removal outpatient but this will need to be rescheduled  ? ? ?Unlikely bacteremia: blood cxs growing stap species, likely containment. Repeat blood cxs NGTD  ?  ?Dementia: w/ hx of down's syndrome. Poor historian. Continue on home dose of keppra  ? ?Likely chronic diastolic CHF: echo from 2/35/5732 showed EF 55 to 60% with no regional wall motion abnormality. ?  ?HLD: continue on statin   ?  ?Hypothyroidism: continue on home dose of levothyroxine  ? ?Thrombocytopenia: resolved  ? ? ? ? ?DVT prophylaxis: heparin  ?Code Status: full  ?Family Communication: ?Disposition Plan: likely d/c back to [facility]  ? ?Level of care: Progressive ? ?Status is: Inpatient ?Remains inpatient appropriate because: severity of illness, still short of breath and significantly wheezing ? ? ?Consultants:  ? ? ?Procedures: ? ?Antimicrobials: zosyn 5/7--->5/13 ?azithromycin 5/5--5/9  ?Cefepime 5/2--5/5 ?Ceftriaxone 5/5--5/6 ? ? ? ?Subjective: ?No significant overnight events, patient still having shortness of breath and coughing and congested, audible wheezing.  Patient is unable to offer any complaints due to her baseline mental status ? ?Objective: ?Vitals:  ? 05/25/21 0808 05/25/21 0814 05/25/21 1219 05/25/21 1525  ?BP: 136/78  139/70 (!) 141/72  ?Pulse: 76  (!) 58 75  ?Resp: _0 ?Temp: 98.7 ?F (37.1 ?C)  98 ?F (36.7 ?C) 97.9 ?F (36.6 ?C)  ?TempSrc: Axillary  Axillary Axillary  ?SpO2: 99% 98% 96% 95%  ?Weight:      ?Height:      ? ? ?Intake/Output Summary (Last 24 hours) at 05/25/2021 1617 ?Last data filed at 05/25/2021 1514 ?Gross per 24 hour  ?Intake 1237.69 ml  ?Output 202 ml  ?Net 1035.69 ml  ? ?Filed Weights  ? 05/24/21 0500 05/24/21 0639 05/25/21 0414  ?Weight: 98.4 kg 97.9 kg 99.2  kg  ? ? ?Examination: ? ?General exam: mils SOB, Appears calm   ?Respiratory system: Bilateral crackles and wheezing ?Cardiovascular system: S1 & S2+. No rubs or clicks  ?Gastrointestinal system: Abd is soft, NT, obese & hypoactive bowel sounds    ?Central nervous system: Alert and awake.  ?Psychiatry: judgement and insight appears at baseline. Flat mood and affect  ? ? ? ?Data Reviewed: I have personally reviewed following labs and imaging studies ? ?CBC: ?Recent Labs  ?Lab 05/21/21 ?2025 05/22/21 ?0449 05/23/21 ?4270 05/24/21 ?0631 05/25/21 ?0701  ?WBC 10.8* 10.2 9.6 8.4 9.0  ?HGB 9.4* 8.7* 9.9* 10.1* 11.1*  ?HCT 31.9* 29.7* 33.9* 34.0* 38.1  ?MCV 98.5 99.0 100.9* 99.4 100.0  ?PLT 147* 175 171 194 174  ? ?Basic Metabolic Panel: ?Recent Labs  ?Lab 05/21/21 ?6237 05/22/21 ?0449 05/23/21 ?6283 05/24/21 ?0631 05/25/21 ?0701  ?NA 143 149* 145 144 145  ?K 3.4* 3.5 4.0 3.8 3.9  ?CL 109 113* 106 107 111  ?CO2 _1 ?GLUCOSE 104* 137* 191* 171* 145*  ?BUN 24* 26* 28* 34* 36*  ?CREATININE 1.56* 1.63* 1.72* 1.59* 1.49*  ?CALCIUM 8.0* 8.1* 8.0* 8.3* 8.4*  ? ?GFR: ?Estimated  Creatinine Clearance: 46.4 mL/min (A) (by C-G formula based on SCr of 1.49 mg/dL (H)). ?Liver Function Tests: ?Recent Labs  ?Lab 05/25/21 ?0701  ?AST 24  ?ALT 30  ?ALKPHOS 50  ?BILITOT 0.2*  ?PROT 7.0  ?ALBUMIN 2.6*  ? ?No results for input(s): LIPASE, AMYLASE in the last 168 hours. ?No results for input(s): AMMONIA in the last 168 hours. ?Coagulation Profile: ?No results for input(s): INR, PROTIME in the last 168 hours. ? ?Cardiac Enzymes: ?No results for input(s): CKTOTAL, CKMB, CKMBINDEX, TROPONINI in the last 168 hours. ?BNP (last 3 results) ?No results for input(s): PROBNP in the last 8760 hours. ?HbA1C: ?No results for input(s): HGBA1C in the last 72 hours. ? ?CBG: ?Recent Labs  ?Lab 05/21/21 ?0756 05/22/21 ?5248 05/23/21 ?1859 05/24/21 ?0931 05/25/21 ?1216  ?GLUCAP 111* 112* 129* 146* 131*  ? ?Lipid Profile: ?No results for input(s): CHOL,  HDL, LDLCALC, TRIG, CHOLHDL, LDLDIRECT in the last 72 hours. ?Thyroid Function Tests: ?No results for input(s): TSH, T4TOTAL, FREET4, T3FREE, THYROIDAB in the last 72 hours. ? ?Anemia Panel: ?Recent Labs  ?

## 2021-05-26 ENCOUNTER — Other Ambulatory Visit: Payer: Medicare (Managed Care)

## 2021-05-26 DIAGNOSIS — J9601 Acute respiratory failure with hypoxia: Secondary | ICD-10-CM | POA: Diagnosis not present

## 2021-05-26 LAB — MAGNESIUM: Magnesium: 2.4 mg/dL (ref 1.7–2.4)

## 2021-05-26 LAB — CBC
HCT: 34.4 % — ABNORMAL LOW (ref 36.0–46.0)
Hemoglobin: 10.2 g/dL — ABNORMAL LOW (ref 12.0–15.0)
MCH: 29.4 pg (ref 26.0–34.0)
MCHC: 29.7 g/dL — ABNORMAL LOW (ref 30.0–36.0)
MCV: 99.1 fL (ref 80.0–100.0)
Platelets: 188 10*3/uL (ref 150–400)
RBC: 3.47 MIL/uL — ABNORMAL LOW (ref 3.87–5.11)
RDW: 17.2 % — ABNORMAL HIGH (ref 11.5–15.5)
WBC: 11.9 10*3/uL — ABNORMAL HIGH (ref 4.0–10.5)
nRBC: 0.5 % — ABNORMAL HIGH (ref 0.0–0.2)

## 2021-05-26 LAB — VITAMIN D 25 HYDROXY (VIT D DEFICIENCY, FRACTURES)
Vit D, 25-Hydroxy: 48.52 ng/mL (ref 30–100)
Vit D, 25-Hydroxy: 46.81 ng/mL (ref 30–100)

## 2021-05-26 LAB — BASIC METABOLIC PANEL
Anion gap: 5 (ref 5–15)
BUN: 40 mg/dL — ABNORMAL HIGH (ref 8–23)
CO2: 31 mmol/L (ref 22–32)
Calcium: 8.2 mg/dL — ABNORMAL LOW (ref 8.9–10.3)
Chloride: 112 mmol/L — ABNORMAL HIGH (ref 98–111)
Creatinine, Ser: 1.38 mg/dL — ABNORMAL HIGH (ref 0.44–1.00)
GFR, Estimated: 42 mL/min — ABNORMAL LOW (ref >60)
Glucose, Bld: 199 mg/dL — ABNORMAL HIGH (ref 70–99)
Potassium: 4.6 mmol/L (ref 3.5–5.1)
Sodium: 148 mmol/L — ABNORMAL HIGH (ref 135–145)

## 2021-05-26 LAB — GLUCOSE, CAPILLARY: Glucose-Capillary: 193 mg/dL — ABNORMAL HIGH (ref 70–99)

## 2021-05-26 LAB — PHOSPHORUS: Phosphorus: 3.2 mg/dL (ref 2.5–4.6)

## 2021-05-26 NOTE — TOC Progression Note (Signed)
Transition of Care (TOC) - Progression Note  ? ? ?Patient Details  ?Name: Narjis Mira [facility] ?MRN: [id number] ?Date of Birth: 22-Apr-1955 ? ?Transition of Care (TOC) CM/SW Contact  ?Truddie Hidden, RN ?Phone Number: ?05/26/2021, 2:43 PM ? ?Clinical Narrative:    ?Contacted Tammy at [facility]. Patient can be accepted to facility tomorrow. MD notified.  ? ? ?Expected Discharge Plan: Skilled Nursing Facility ?Barriers to Discharge: Continued Medical Work up ? ?Expected Discharge Plan and Services ?Expected Discharge Plan: Skilled Nursing Facility ?  ?  ?  ?Living arrangements for the past 2 months: Skilled Nursing Facility ?                ?  ?  ?  ?  ?  ?  ?  ?  ?  ?  ? ? ?Social Determinants of Health (SDOH) Interventions ?  ? ?Readmission Risk Interventions ?   ? View : No data to display.  ?  ?  ?  ? ? ?

## 2021-05-26 NOTE — Progress Notes (Signed)
Physical Therapy Treatment ?Patient Details ?Name: Lori Petersen [facility] ?MRN: [id number] ?DOB: [birth year] ?Today's Date: 05/26/2021 ? ? ?History of Present Illness Pt is a 66 y/o F admitted on 05/17/21 after presenting to the ED with c/o cough & SOB. Pt was initially on nonrebreather mask but later placed on bipap. Pt is being treated for acute hypoxic respiratory failure & mild metabolic encephalopathy. PMH: down syndrome, heart failure, HTN, dementia, HLD ? ?  ?PT Comments  ? ? Pt received in Semi-Fowler's position and agreeable to therapy.  Pt unable to follow single commands today and unable to assist with most mobility.  Pt underwent PROM of the B LE and B UE in order to prevent muscle contractures from occurring.  Pt has good mobility in all UE mobility and most of LE, noting decreased ROM with knee flexion.  Pt left in room with all needs met and   Current discharge plans to SNF remain appropriate at this time.  Pt will continue to benefit from skilled therapy in order to address deficits listed below. ? ? ?   ?Recommendations for follow up therapy are one component of a multi-disciplinary discharge planning process, led by the attending physician.  Recommendations may be updated based on patient status, additional functional criteria and insurance authorization. ? ?Follow Up Recommendations ? Skilled nursing-short term rehab (<3 hours/day) ?  ?  ?Assistance Recommended at Discharge Frequent or constant Supervision/Assistance  ?Patient can return home with the following Two people to help with walking and/or transfers;Two people to help with bathing/dressing/bathroom;Direct supervision/assist for medications management;Help with stairs or ramp for entrance;Assistance with feeding;Assist for transportation;Direct supervision/assist for financial management;Assistance with cooking/housework ?  ?Equipment Recommendations ? None recommended by PT  ?  ?Recommendations for Other Services   ? ? ?  ?Precautions / Restrictions  Precautions ?Precautions: Fall ?Restrictions ?Weight Bearing Restrictions: No  ?  ? ?Mobility ? Bed Mobility ?  ?  ?  ?  ?  ?  ?  ?General bed mobility comments: deferred as pt was not assisting therapist with mobility efforts today. ?  ? ?Transfers ?  ?  ?  ?  ?  ?  ?  ?  ?  ?  ?  ? ?Ambulation/Gait ?  ?  ?  ?  ?  ?  ?  ?  ? ? ?Stairs ?  ?  ?  ?  ?  ? ? ?Wheelchair Mobility ?  ? ?Modified Rankin (Stroke Patients Only) ?  ? ? ?  ?Balance   ?  ?  ?  ?  ?  ?  ?  ?  ?  ?  ?  ?  ?  ?  ?  ?  ?  ?  ?  ? ?  ?Cognition Arousal/Alertness: Awake/alert ?Behavior During Therapy: Flat affect ?Overall Cognitive Status: History of cognitive impairments - at baseline ?  ?  ?  ?  ?  ?  ?  ?  ?  ?  ?  ?  ?  ?  ?  ?  ?General Comments: patient has difficulty following single step commands ?  ?  ? ?  ?Exercises Total Joint Exercises ?Ankle Circles/Pumps: PROM, Both, 20 reps, Supine ?Hip ABduction/ADduction: PROM, Both, 20 reps, Supine ?Straight Leg Raises: PROM, Both, 20 reps, Supine ?Knee Flexion: PROM, Both, 20 reps, Supine ? ?  ?General Comments   ?  ?  ? ?Pertinent Vitals/Pain Pain Assessment ?Pain Assessment: No/denies pain  ? ? ?Home Living   ?  ?  ?  ?  ?  ?  ?  ?  ?  ?   ?  ?  Prior Function    ?  ?  ?   ? ?PT Goals (current goals can now be found in the care plan section) Acute Rehab PT Goals ?Patient Stated Goal: none stated ?PT Goal Formulation: Patient unable to participate in goal setting ?Time For Goal Achievement: 06/04/21 ?Progress towards PT goals: Progressing toward goals ? ?  ?Frequency ? ? ? Min 2X/week ? ? ? ?  ?PT Plan Current plan remains appropriate  ? ? ?Co-evaluation   ?  ?  ?  ?  ? ?  ?AM-PAC PT "6 Clicks" Mobility   ?Outcome Measure ? Help needed turning from your back to your side while in a flat bed without using bedrails?: Total ?Help needed moving from lying on your back to sitting on the side of a flat bed without using bedrails?: Total ?Help needed moving to and from a bed to a chair (including a  wheelchair)?: Total ?Help needed standing up from a chair using your arms (e.g., wheelchair or bedside chair)?: Total ?Help needed to walk in hospital room?: Total ?Help needed climbing 3-5 steps with a railing? : Total ?6 Click Score: 6 ? ?  ?End of Session Equipment Utilized During Treatment: Oxygen ?Activity Tolerance: Patient tolerated treatment well ?Patient left: in bed;with call bell/phone within reach;with bed alarm set ?Nurse Communication: Mobility status ?PT Visit Diagnosis: Muscle weakness (generalized) (M62.81);Difficulty in walking, not elsewhere classified (R26.2);Other abnormalities of gait and mobility (R26.89) ?  ? ? ?Time: 1550-2714 ?PT Time Calculation (min) (ACUTE ONLY): 12 min ? ?Charges:  $Therapeutic Exercise: 8-22 mins          ?          ? ?Gwenlyn Saran, PT, DPT ?05/26/21, 3:26 PM ? ? ? ?Christie Nottingham ?05/26/2021, 3:17 PM ? ?

## 2021-05-26 NOTE — Care Management Important Message (Signed)
Important Message ? ?Patient Details  ?Name: Lori Petersen [facility] ?MRN: [id number] ?Date of Birth: February 28, 1955 ? ? ?Medicare Important Message Given:  Yes ? ? ? ? ?[name] ?05/26/2021, 3:48 PM ?

## 2021-05-26 NOTE — Progress Notes (Signed)
?PROGRESS NOTE ? ? ?HPI was taken from Dr. Louanne Belton: ?Lori Petersen [facility] is a 66 y.o. female Patient is a 66 years old female with past medical history of Down syndrome, heart failure, hypertension, dementia, hyperlipidemia presented to hospital with complaints of cough and shortness of breath.  Of note, patient was recently seen by her primary care physician as outpatient.  Initially patient had presented with some stuffy runny nose subsequently she had increasing shortness of breath.  She was noted to be hypoxic and EMS was called in.  Patient required nonrebreather mask in route to the hospital.  History is extremely limited due to patient's underlying dementia and Down syndrome and family not available.  ED provider had spoken with the PCP .  [facility]   No prior history of COPD. ?  ?In the ED, patient was tachycardic, tachypneic with a temperature of 100.2 ?F, lactate was elevated at 2.4 and had leukocytosis at 13.6.  Creatinine mildly elevated at 1.4.  She was in respiratory distress and was on nonrebreather mask initially but due to  had increased work of breathing with diffuse wheezing, she was subsequently placed on BiPAP.  EKG showed sinus tachycardia.  Chest-ray however did not show any focal abnormality.  Urinalysis showed turbid urine with significant glucose and protein and large leukocytes and WBC more than 50. Blood cultures were sent from the ED including urinalysis.  Code sepsis was activated in the ED.  COVID and influenza was negative.  Patient received IV fluid bolus followed by vancomycin and cefepime, IV Solu-Medrol 125 mg x 1. Patient was then considered for admission to the hospital for possible sepsis secondary to pneumonia with hypoxic respiratory failure. ? ? ?As per Dr. Jimmye Norman 5/3-05/24/21: Pt presented w/ shortness of breath found to be likely secondary to multifocal pneumonia. Pt was initially put on IV vanco, cefepime, bronchodilators,incentive  spirometry & supplemental oxygen. Pt started to make improvements so abxs were changed to IV rocephin, azithromycin. Pt's respiratory status then took a turn for the worse and pt was transferred to stepdown for increased work of breathing and was placed on BiPAP. Pt's respiratory status did not improve much with BiPAP, so lasix was given intermittently and then abxs were changed to IV zosyn & azithromycin was continue. IV steroids were also added. Pt's respiratory and mental status have improved over the last 24 hrs.  ? ? ?Lori Petersen [facility]  EXH:[id number] DOB: 1955/03/30 DOA: 05/17/2021 ?PCP: [facility] [place]  ? ? ?Assessment & Plan: ?  ?Principal Problem: ?  Acute respiratory failure with hypoxia ([place]) ?Active Problems: ?  Chronic diastolic CHF (congestive heart failure) ([place]) ?  Hypothyroid ?  Mixed Alzheimer's and vascular dementia ([place]) ?  Obesity, Class III, BMI 40-49.9 (morbid obesity) ([place]) ?  Pneumonia ?  Dementia ([place]) ?  HLD (hyperlipidemia) ?  Sepsis ([place]) ?  Respiratory failure ([place]) ? ?Assessment and Plan: ? ?  ?Acute hypoxic respiratory failure: intermittently on BiPAP. Continue on supplemental oxygen and wean as tolerated.  ?At baseline patient uses oxygen at nighttime only as per POA ?Currently patient is on 2 L oxygen via [place], continue to wean down as per improvement ?  ?Mild metabolic encephalopathy: likely secondary to pneumonia. Has underlying dementia & Down's syndrome. CT head shows no acute intracranial abnormalities. Improving today  ?  ?Sepsis: met criteria w/ tachycardia, tachypnea, fever, leukocytosis & secondary to multifocal pneumonia & UTI. S/p IV azithromycin x 5 days, ceftriaxone  x 3days. Sepsis resolved  ? ?Multifocal pneumonia: improving today. Continue on IV zosyn (started 5/7 & azithromycin, IV steroids & bronchodilators. D/c IV rocephin.  ?Continue Zosyn for total 7 days ?5/10 increased IV Solu-Medrol 40 every 12 hourly due to persistent wheezing and shortness of  breath ?Started Mucinex 600 twice daily, Hycodan as needed for cough ?Chest x-ray ? ? ?AKI: baseline Cr/GFR is unknown Cr is trending down from day prior  ? ?Hypernatremia: resolved  ? ?UTI: w/ hx left ureteral stent in the past with distal left ureteral stone with persistent mild left hydroureteronephrosis. Urine cx growing yeast, completed fluconazole course. Scheduled on 05/20/21 for urethral stone removal outpatient but this will need to be rescheduled  ? ? ?Unlikely bacteremia: blood cxs growing stap species, likely containment. Repeat blood cxs NGTD  ?  ?Dementia: w/ hx of down's syndrome. Poor historian. Continue on home dose of keppra  ? ?Likely chronic diastolic CHF: echo from 3/41/9379 showed EF 55 to 60% with no regional wall motion abnormality. ?  ?HLD: continue on statin   ?  ?Hypothyroidism: continue on home dose of levothyroxine  ? ?Thrombocytopenia: resolved  ? ? ? ? ?DVT prophylaxis: heparin  ?Code Status: full  ?Family Communication: Discussed with the POA over the phone on 5/11 ?Disposition Plan: likely d/c back to [facility]  ? ?Level of care: Progressive ? ?Status is: Inpatient ?Remains inpatient appropriate because: severity of illness, still short of breath and significantly wheezing ? ? ?Consultants:  ? ? ?Procedures: ? ?Antimicrobials: zosyn 5/7--->5/13 ?azithromycin 5/5--5/9  ?Cefepime 5/2--5/5 ?Ceftriaxone 5/5--5/6 ? ? ? ?Subjective: ?No significant overnight events, patient has underlying dementia and Down syndrome, unable to offer any complaints.  Patient seems to be improving, little bit less wheezing and less respiratory distress as compared to yesterday. ? ? ? ?Objective: ?Vitals:  ? 05/26/21 1148 05/26/21 1400 05/26/21 1514 05/26/21 1709  ?BP: (!) 137/58  (!) 152/78 (!) 141/69  ?Pulse: 61   63  ?Resp: (!) 23  (!) 24 (!) 23  ?Temp:    97.7 ?F (36.5 ?C)  ?TempSrc:    Oral  ?SpO2:  96% 96% 100%  ?Weight:      ?Height:      ? ? ?Intake/Output Summary (Last 24 hours) at 05/26/2021 1714 ?Last data  filed at 05/26/2021 1406 ?Gross per 24 hour  ?Intake 1191.58 ml  ?Output 600 ml  ?Net 591.58 ml  ? ?Filed Weights  ? 05/24/21 0639 05/25/21 0414 05/26/21 0600  ?Weight: 97.9 kg 99.2 kg 98.7 kg  ? ? ?Examination: ? ?General exam: mils SOB, Appears calm   ?Respiratory system: Bilateral crackles and wheezing, improved as compared to yesterday ?Cardiovascular system: S1 & S2+. No rubs or clicks  ?Gastrointestinal system: Abd is soft, NT, obese & hypoactive bowel sounds    ?Central nervous system: Alert and awake.  ?Psychiatry: judgement and insight appears at baseline. Flat mood and affect  ? ? ? ?Data Reviewed: I have personally reviewed following labs and imaging studies ? ?CBC: ?Recent Labs  ?Lab 05/22/21 ?0240 05/23/21 ?9735 05/24/21 ?0631 05/25/21 ?0701 05/26/21 ?3299  ?WBC 10.2 9.6 8.4 9.0 11.9*  ?HGB 8.7* 9.9* 10.1* 11.1* 10.2*  ?HCT 29.7* 33.9* 34.0* 38.1 34.4*  ?MCV 99.0 100.9* 99.4 100.0 99.1  ?PLT 175 171 194 174 188  ? ?Basic Metabolic Panel: ?Recent Labs  ?Lab 05/22/21 ?2426 05/23/21 ?8341 05/24/21 ?0631 05/25/21 ?0701 05/26/21 ?9622  ?NA 149* 145 144 145 148*  ?K 3.5 4.0 3.8 3.9 4.6  ?CL  113* 106 107 111 112*  ?CO2 30 31 31 26 31   ?GLUCOSE 137* 191* 171* 145* 199*  ?BUN 26* 28* 34* 36* 40*  ?CREATININE 1.63* 1.72* 1.59* 1.49* 1.38*  ?CALCIUM 8.1* 8.0* 8.3* 8.4* 8.2*  ?MG  --   --   --   --  2.4  ?PHOS  --   --   --   --  3.2  ? ?GFR: ?Estimated Creatinine Clearance: 49.9 mL/min (A) (by C-G formula based on SCr of 1.38 mg/dL (H)). ?Liver Function Tests: ?Recent Labs  ?Lab 05/25/21 ?0701  ?AST 24  ?ALT 30  ?ALKPHOS 50  ?BILITOT 0.2*  ?PROT 7.0  ?ALBUMIN 2.6*  ? ?No results for input(s): LIPASE, AMYLASE in the last 168 hours. ?No results for input(s): AMMONIA in the last 168 hours. ?Coagulation Profile: ?No results for input(s): INR, PROTIME in the last 168 hours. ? ?Cardiac Enzymes: ?No results for input(s): CKTOTAL, CKMB, CKMBINDEX, TROPONINI in the last 168 hours. ?BNP (last 3 results) ?No results for  input(s): PROBNP in the last 8760 hours. ?HbA1C: ?No results for input(s): HGBA1C in the last 72 hours. ? ?CBG: ?Recent Labs  ?Lab 05/22/21 ?8102 05/23/21 ?5486 05/24/21 ?2824 05/25/21 ?1753 05/26/21 ?0756  ?GLUCAP

## 2021-05-27 DIAGNOSIS — J9601 Acute respiratory failure with hypoxia: Secondary | ICD-10-CM | POA: Diagnosis not present

## 2021-05-27 LAB — BASIC METABOLIC PANEL
Anion gap: 6 (ref 5–15)
BUN: 42 mg/dL — ABNORMAL HIGH (ref 8–23)
CO2: 33 mmol/L — ABNORMAL HIGH (ref 22–32)
Calcium: 8.6 mg/dL — ABNORMAL LOW (ref 8.9–10.3)
Chloride: 108 mmol/L (ref 98–111)
Creatinine, Ser: 1.41 mg/dL — ABNORMAL HIGH (ref 0.44–1.00)
GFR, Estimated: 41 mL/min — ABNORMAL LOW (ref >60)
Glucose, Bld: 224 mg/dL — ABNORMAL HIGH (ref 70–99)
Potassium: 4.6 mmol/L (ref 3.5–5.1)
Sodium: 147 mmol/L — ABNORMAL HIGH (ref 135–145)

## 2021-05-27 LAB — CBC
HCT: 35.6 % — ABNORMAL LOW (ref 36.0–46.0)
Hemoglobin: 10.5 g/dL — ABNORMAL LOW (ref 12.0–15.0)
MCH: 29.7 pg (ref 26.0–34.0)
MCHC: 29.5 g/dL — ABNORMAL LOW (ref 30.0–36.0)
MCV: 100.6 fL — ABNORMAL HIGH (ref 80.0–100.0)
Platelets: 181 10*3/uL (ref 150–400)
RBC: 3.54 MIL/uL — ABNORMAL LOW (ref 3.87–5.11)
RDW: 17.2 % — ABNORMAL HIGH (ref 11.5–15.5)
WBC: 12.1 10*3/uL — ABNORMAL HIGH (ref 4.0–10.5)
nRBC: 0.5 % — ABNORMAL HIGH (ref 0.0–0.2)

## 2021-05-27 LAB — GLUCOSE, CAPILLARY
Glucose-Capillary: 196 mg/dL — ABNORMAL HIGH (ref 70–99)
Glucose-Capillary: 206 mg/dL — ABNORMAL HIGH (ref 70–99)
Glucose-Capillary: 229 mg/dL — ABNORMAL HIGH (ref 70–99)
Glucose-Capillary: 205 mg/dL — ABNORMAL HIGH (ref 70–99)

## 2021-05-27 LAB — MAGNESIUM: Magnesium: 2.4 mg/dL (ref 1.7–2.4)

## 2021-05-27 LAB — PHOSPHORUS: Phosphorus: 3.7 mg/dL (ref 2.5–4.6)

## 2021-05-27 MED ORDER — GUAIFENESIN 100 MG/5ML PO LIQD
10.0000 mL | Freq: Four times a day (QID) | ORAL | Status: DC
  Administered 2021-05-27 – 2021-05-30 (×11): 10 mL via ORAL
  Filled 2021-05-27 (×11): qty 10

## 2021-05-27 MED ORDER — INSULIN ASPART 100 UNIT/ML IJ SOLN
0.0000 [IU] | Freq: Three times a day (TID) | INTRAMUSCULAR | Status: DC
  Administered 2021-05-27 – 2021-05-28 (×3): 5 [IU] via SUBCUTANEOUS
  Administered 2021-05-28 (×2): 3 [IU] via SUBCUTANEOUS
  Administered 2021-05-29: 5 [IU] via SUBCUTANEOUS
  Administered 2021-05-29 – 2021-05-30 (×3): 3 [IU] via SUBCUTANEOUS
  Administered 2021-05-30: 5 [IU] via SUBCUTANEOUS
  Filled 2021-05-27 (×10): qty 1

## 2021-05-27 MED ORDER — PREDNISONE 20 MG PO TABS
40.0000 mg | ORAL_TABLET | Freq: Every day | ORAL | Status: AC
Start: 2021-05-28 — End: 2021-05-30
  Administered 2021-05-28 – 2021-05-30 (×3): 40 mg via ORAL
  Filled 2021-05-27 (×3): qty 2

## 2021-05-27 MED ORDER — METHYLPREDNISOLONE SODIUM SUCC 40 MG IJ SOLR
40.0000 mg | Freq: Two times a day (BID) | INTRAMUSCULAR | Status: AC
  Administered 2021-05-27: 40 mg via INTRAVENOUS
  Filled 2021-05-27: qty 1

## 2021-05-27 MED ORDER — PREDNISONE 10 MG PO TABS: 10.0000 mg | ORAL_TABLET | Freq: Every day | ORAL | Status: DC

## 2021-05-27 MED ORDER — PREDNISONE 20 MG PO TABS: 20.0000 mg | ORAL_TABLET | Freq: Every day | ORAL | Status: DC

## 2021-05-27 MED ORDER — PREDNISONE 20 MG PO TABS: 30.0000 mg | ORAL_TABLET | Freq: Every day | ORAL | Status: DC

## 2021-05-27 MED ORDER — IPRATROPIUM-ALBUTEROL 0.5-2.5 (3) MG/3ML IN SOLN
3.0000 mL | Freq: Two times a day (BID) | RESPIRATORY_TRACT | Status: DC
  Administered 2021-05-27 – 2021-05-30 (×6): 3 mL via RESPIRATORY_TRACT
  Filled 2021-05-27 (×6): qty 3

## 2021-05-27 NOTE — Progress Notes (Signed)
Physical Therapy Treatment ?Patient Details ?Name: Lori Petersen [facility] ?MRN: [id number] ?DOB: October 17, 1955 ?Today's Date: 05/27/2021 ? ? ?History of Present Illness Pt is a 66 y/o F admitted on 05/17/21 after presenting to the ED with c/o cough & SOB. Pt was initially on nonrebreather mask but later placed on bipap. Pt is being treated for acute hypoxic respiratory failure & mild metabolic encephalopathy. PMH: down syndrome, heart failure, HTN, dementia, HLD ? ?  ?PT Comments  ? ? Pt unable to communicate verbally during the session other than occasional yes/no responses to questions and provided her first name.  Pt unable to participate actively during the session other than providing minimal assist on 1-2 occasions with PROM per below.  Pt will benefit from a trial of PT services in a SNF setting upon discharge to safely address deficits listed in patient problem list for decreased caregiver assistance and eventual return to PLOF but may ultimately be appropriate for LTC. ?  ?Recommendations for follow up therapy are one component of a multi-disciplinary discharge planning process, led by the attending physician.  Recommendations may be updated based on patient status, additional functional criteria and insurance authorization. ? ?Follow Up Recommendations ? Skilled nursing-short term rehab (<3 hours/day) ?  ?  ?Assistance Recommended at Discharge Frequent or constant Supervision/Assistance  ?Patient can return home with the following Two people to help with walking and/or transfers;Two people to help with bathing/dressing/bathroom;Direct supervision/assist for medications management;Help with stairs or ramp for entrance;Assistance with feeding;Assist for transportation;Direct supervision/assist for financial management;Assistance with cooking/housework ?  ?Equipment Recommendations ? None recommended by PT  ?  ?Recommendations for Other Services   ? ? ?  ?Precautions / Restrictions Precautions ?Precautions:  Fall ?Restrictions ?Weight Bearing Restrictions: No  ?  ? ?Mobility ? Bed Mobility ?  ?  ?  ?  ?  ?  ?  ?General bed mobility comments: Pt unable to assist with bed mobility tasks, total assist at this time ?  ? ?Transfers ?  ?  ?  ?  ?  ?  ?  ?  ?  ?  ?  ? ?Ambulation/Gait ?  ?  ?  ?  ?  ?  ?  ?  ? ? ?Stairs ?  ?  ?  ?  ?  ? ? ?Wheelchair Mobility ?  ? ?Modified Rankin (Stroke Patients Only) ?  ? ? ?  ?Balance   ?  ?  ?  ?  ?  ?  ?  ?  ?  ?  ?  ?  ?  ?  ?  ?  ?  ?  ?  ? ?  ?Cognition Arousal/Alertness: Awake/alert ?Behavior During Therapy: Flat affect ?Overall Cognitive Status: History of cognitive impairments - at baseline ?  ?  ?  ?  ?  ?  ?  ?  ?  ?  ?  ?  ?  ?  ?  ?  ?  ?  ?  ? ?  ?Exercises Total Joint Exercises ?Ankle Circles/Pumps: PROM, Both, 20 reps, 15 reps ?Short Arc Quad: PROM, Both, 10 reps ?Heel Slides: PROM, Both, 10 reps ?Hip ABduction/ADduction: PROM, Both, 10 reps, 5 reps ?Straight Leg Raises: PROM, Both, 10 reps, 5 reps ?Other Exercises: BUE shoulder and elbow PROM  ?Other Exercises: Attempts at rolling with max cuing for assist ? ?  ?General Comments   ?  ?  ? ?Pertinent Vitals/Pain Pain Assessment ?Pain Assessment: No/denies pain  ? ? ?Home Living   ?  ?  ?  ?  ?  ?  ?  ?  ?  ?   ?  ?  Prior Function    ?  ?  ?   ? ?PT Goals (current goals can now be found in the care plan section) Progress towards PT goals: Not progressing toward goals - comment (No assist from patient during session, limited by cognition) ? ?  ?Frequency ? ? ? Min 2X/week ? ? ? ?  ?PT Plan Current plan remains appropriate  ? ? ?Co-evaluation   ?  ?  ?  ?  ? ?  ?AM-PAC PT "6 Clicks" Mobility   ?Outcome Measure ? Help needed turning from your back to your side while in a flat bed without using bedrails?: Total ?Help needed moving from lying on your back to sitting on the side of a flat bed without using bedrails?: Total ?Help needed moving to and from a bed to a chair (including a wheelchair)?: Total ?Help needed standing up from  a chair using your arms (e.g., wheelchair or bedside chair)?: Total ?Help needed to walk in hospital room?: Total ?Help needed climbing 3-5 steps with a railing? : Total ?6 Click Score: 6 ? ?  ?End of Session Equipment Utilized During Treatment: Oxygen ?Activity Tolerance: Patient tolerated treatment well ?Patient left: in bed;with call bell/phone within reach;with bed alarm set ?Nurse Communication: Mobility status ?PT Visit Diagnosis: Muscle weakness (generalized) (M62.81);Difficulty in walking, not elsewhere classified (R26.2);Other abnormalities of gait and mobility (R26.89) ?  ? ? ?Time: 5361-4431 ?PT Time Calculation (min) (ACUTE ONLY): 16 min ? ?Charges:  $Therapeutic Exercise: 8-22 mins          ?          ? ?D. Elly Modena PT, DPT ?05/27/21, 5:26 PM ? ? ?

## 2021-05-27 NOTE — Progress Notes (Signed)
?PROGRESS NOTE ? ? ?HPI was taken from Dr. Louanne Petersen: ?Lori Petersen [facility] is a 66 y.o. female Patient is a 66 years old female with past medical history of Down syndrome, heart failure, hypertension, dementia, hyperlipidemia presented to hospital with complaints of cough and shortness of breath.  Of note, patient was recently seen by her primary care physician as outpatient.  Initially patient had presented with some stuffy runny nose subsequently she had increasing shortness of breath.  She was noted to be hypoxic and EMS was called in.  Patient required nonrebreather mask in route to the hospital.  History is extremely limited due to patient's underlying dementia and Down syndrome and family not available.  ED provider had spoken with the PCP .  [facility]   No prior history of COPD. ?  ?In the ED, patient was tachycardic, tachypneic with a temperature of 100.2 ?F, lactate was elevated at 2.4 and had leukocytosis at 13.6.  Creatinine mildly elevated at 1.4.  She was in respiratory distress and was on nonrebreather mask initially but due to  had increased work of breathing with diffuse wheezing, she was subsequently placed on BiPAP.  EKG showed sinus tachycardia.  Chest-ray however did not show any focal abnormality.  Urinalysis showed turbid urine with significant glucose and protein and large leukocytes and WBC more than 50. Blood cultures were sent from the ED including urinalysis.  Code sepsis was activated in the ED.  COVID and influenza was negative.  Patient received IV fluid bolus followed by vancomycin and cefepime, IV Solu-Medrol 125 mg x 1. Patient was then considered for admission to the hospital for possible sepsis secondary to pneumonia with hypoxic respiratory failure. ? ? ?As per Dr. Jimmye Petersen 5/3-05/24/21: Pt presented w/ shortness of breath found to be likely secondary to multifocal pneumonia. Pt was initially put on IV vanco, cefepime, bronchodilators,incentive  spirometry & supplemental oxygen. Pt started to make improvements so abxs were changed to IV rocephin, azithromycin. Pt's respiratory status then took a turn for the worse and pt was transferred to stepdown for increased work of breathing and was placed on BiPAP. Pt's respiratory status did not improve much with BiPAP, so lasix was given intermittently and then abxs were changed to IV zosyn & azithromycin was continue. IV steroids were also added. Pt's respiratory and mental status have improved over the last 24 hrs.  ? ? ?Lori Petersen [facility]  LOV:[id number] DOB: Jan 04, 1956 DOA: 05/17/2021 ?PCP: [facility] [place]  ? ? ?Assessment & Plan: ?  ?Principal Problem: ?  Acute respiratory failure with hypoxia ([place]) ?Active Problems: ?  Chronic diastolic CHF (congestive heart failure) ([place]) ?  Hypothyroid ?  Mixed Alzheimer's and vascular dementia ([place]) ?  Obesity, Class III, BMI 40-49.9 (morbid obesity) ([place]) ?  Pneumonia ?  Dementia ([place]) ?  HLD (hyperlipidemia) ?  Sepsis ([place]) ?  Respiratory failure ([place]) ? ?Assessment and Plan: ? ?  ?Acute hypoxic respiratory failure: intermittently on BiPAP. Continue on supplemental oxygen and wean as tolerated.  ?At baseline patient uses oxygen at nighttime only as per POA ?Currently patient is on 1 L oxygen via [place], continue to wean down as per improvement ?  ?Mild metabolic encephalopathy: likely secondary to pneumonia. Has underlying dementia & Down's syndrome. CT head shows no acute intracranial abnormalities. Improving today  ?  ?Sepsis: met criteria w/ tachycardia, tachypnea, fever, leukocytosis & secondary to multifocal pneumonia & UTI. S/p IV azithromycin x 5 days, ceftriaxone  x 3days. Sepsis resolved  ?Due to hypotension patient was on midodrine, BP stable now, discontinued midodrine ? ?Multifocal pneumonia: improving today. Continue on IV zosyn (started 5/7 & azithromycin, IV steroids & bronchodilators. D/c IV rocephin.  ?Continue Zosyn for total 7 days ?5/10  increased IV Solu-Medrol 40 every 12 hourly due to persistent wheezing and shortness of breath ?Started Mucinex 600 twice daily, Hycodan as needed for cough ?Chest x-ray: Mild vascular congestion without edema Mild bibasilar airspace disease with mild improvement on the right. Small bilateral effusions. Possible pneumonia or mild fluid overload. ?5/13 started prednisone tapering dose ? ?AKI: baseline Cr/GFR is unknown Cr is trending down from day prior  ? ?Hypernatremia: resolved  ? ?UTI: w/ hx left ureteral stent in the past with distal left ureteral stone with persistent mild left hydroureteronephrosis. Urine cx growing yeast, completed fluconazole course. Scheduled on 05/20/21 for urethral stone removal outpatient but this will need to be rescheduled  ? ? ?Unlikely bacteremia: blood cxs growing stap species, likely containment. Repeat blood cxs NGTD  ?  ?Dementia: w/ hx of down's syndrome. Poor historian. Continue on home dose of keppra  ? ?Likely chronic diastolic CHF: echo from 1/61/0960 showed EF 55 to 60% with no regional wall motion abnormality. ?  ?HLD: continue on statin   ?  ?Hypothyroidism: continue on home dose of levothyroxine  ? ?Thrombocytopenia: resolved  ? ?Prediabetic, hemoglobin A1c 5.9, hyperglycemia secondary to steroids, started Humalog sliding scale, monitor FSBG ? ? ?DVT prophylaxis: heparin  ?Code Status: full  ?Family Communication: Discussed with the POA over the phone on 5/11 ?Disposition Plan: likely d/c back to [facility]  ? ?Level of care: Progressive ? ?Status is: Inpatient ?Remains inpatient appropriate because: severity of illness, still short of breath and significantly wheezing.  Started tapering dose prednisone from tomorrow, plan to discharge her on Monday, patient is having some hyperglycemia due to steroids ? ? ?Consultants:  ? ? ?Procedures: ? ?Antimicrobials: zosyn 5/7--->5/13 ?azithromycin 5/5--5/9  ?Cefepime 5/2--5/5 ?Ceftriaxone 5/5--5/6 ? ? ? ?Subjective: ?No significant  overnight events, patient has underlying dementia and Down syndrome, unable to offer any complaints.  Patient seems to be improving, little bit less wheezing and less respiratory distress as compared to yesterday. ? ? ? ?Objective: ?Vitals:  ? 05/26/21 2300 05/27/21 0819 05/27/21 1119 05/27/21 1502  ?BP:  130/65 125/69 (!) 147/74  ?Pulse:  60 72 61  ?Resp:  (!) 22 20 18   ?Temp: 98.2 ?F (36.8 ?C) 97.9 ?F (36.6 ?C) 98.4 ?F (36.9 ?C) 98.7 ?F (37.1 ?C)  ?TempSrc: Axillary Oral Oral   ?SpO2:  100% 96% 96%  ?Weight:      ?Height:      ? ? ?Intake/Output Summary (Last 24 hours) at 05/27/2021 1554 ?Last data filed at 05/27/2021 1419 ?Gross per 24 hour  ?Intake 840 ml  ?Output 750 ml  ?Net 90 ml  ? ?Filed Weights  ? 05/24/21 0639 05/25/21 0414 05/26/21 0600  ?Weight: 97.9 kg 99.2 kg 98.7 kg  ? ? ?Examination: ? ?General exam: mils SOB, Appears calm   ?Respiratory system: Bilateral crackles and wheezing, improved as compared to yesterday ?Cardiovascular system: S1 & S2+. No rubs or clicks  ?Gastrointestinal system: Abd is soft, NT, obese & hypoactive bowel sounds    ?Central nervous system: Alert and awake.  ?Psychiatry: judgement and insight appears at baseline. Flat mood and affect  ? ? ? ?Data Reviewed: I have personally reviewed following labs and imaging studies ? ?CBC: ?Recent Labs  ?Lab 05/23/21 ?  7867 05/24/21 ?5449 05/25/21 ?0701 05/26/21 ?2010 05/27/21 ?0712  ?WBC 9.6 8.4 9.0 11.9* 12.1*  ?HGB 9.9* 10.1* 11.1* 10.2* 10.5*  ?HCT 33.9* 34.0* 38.1 34.4* 35.6*  ?MCV 100.9* 99.4 100.0 99.1 100.6*  ?PLT 171 194 174 188 181  ? ?Basic Metabolic Panel: ?Recent Labs  ?Lab 05/23/21 ?1975 05/24/21 ?0631 05/25/21 ?0701 05/26/21 ?8832 05/27/21 ?5498  ?NA 145 144 145 148* 147*  ?K 4.0 3.8 3.9 4.6 4.6  ?CL 106 107 111 112* 108  ?CO2 31 31 26 31  33*  ?GLUCOSE 191* 171* 145* 199* 224*  ?BUN 28* 34* 36* 40* 42*  ?CREATININE 1.72* 1.59* 1.49* 1.38* 1.41*  ?CALCIUM 8.0* 8.3* 8.4* 8.2* 8.6*  ?MG  --   --   --  2.4 2.4  ?PHOS  --   --   --   3.2 3.7  ? ?GFR: ?Estimated Creatinine Clearance: 48.9 mL/min (A) (by C-G formula based on SCr of 1.41 mg/dL (H)). ?Liver Function Tests: ?Recent Labs  ?Lab 05/25/21 ?0701  ?AST 24  ?ALT 30  ?ALKPHOS 50  ?BILITOT 0.2*  ?

## 2021-05-28 LAB — CBC
HCT: 35.6 % — ABNORMAL LOW (ref 36.0–46.0)
Hemoglobin: 10.6 g/dL — ABNORMAL LOW (ref 12.0–15.0)
MCH: 29.8 pg (ref 26.0–34.0)
MCHC: 29.8 g/dL — ABNORMAL LOW (ref 30.0–36.0)
MCV: 100 fL (ref 80.0–100.0)
Platelets: 195 10*3/uL (ref 150–400)
RBC: 3.56 MIL/uL — ABNORMAL LOW (ref 3.87–5.11)
RDW: 17.7 % — ABNORMAL HIGH (ref 11.5–15.5)
WBC: 10.7 10*3/uL — ABNORMAL HIGH (ref 4.0–10.5)
nRBC: 0.3 % — ABNORMAL HIGH (ref 0.0–0.2)

## 2021-05-28 LAB — BASIC METABOLIC PANEL
Anion gap: 4 — ABNORMAL LOW (ref 5–15)
BUN: 40 mg/dL — ABNORMAL HIGH (ref 8–23)
CO2: 33 mmol/L — ABNORMAL HIGH (ref 22–32)
Calcium: 8.4 mg/dL — ABNORMAL LOW (ref 8.9–10.3)
Chloride: 107 mmol/L (ref 98–111)
Creatinine, Ser: 1.22 mg/dL — ABNORMAL HIGH (ref 0.44–1.00)
GFR, Estimated: 49 mL/min — ABNORMAL LOW (ref >60)
Glucose, Bld: 239 mg/dL — ABNORMAL HIGH (ref 70–99)
Potassium: 4.5 mmol/L (ref 3.5–5.1)
Sodium: 144 mmol/L (ref 135–145)

## 2021-05-28 LAB — GLUCOSE, CAPILLARY
Glucose-Capillary: 194 mg/dL — ABNORMAL HIGH (ref 70–99)
Glucose-Capillary: 171 mg/dL — ABNORMAL HIGH (ref 70–99)
Glucose-Capillary: 235 mg/dL — ABNORMAL HIGH (ref 70–99)
Glucose-Capillary: 185 mg/dL — ABNORMAL HIGH (ref 70–99)

## 2021-05-28 LAB — PHOSPHORUS: Phosphorus: 4 mg/dL (ref 2.5–4.6)

## 2021-05-28 LAB — MAGNESIUM: Magnesium: 2.4 mg/dL (ref 1.7–2.4)

## 2021-05-28 NOTE — Progress Notes (Signed)
?PROGRESS NOTE ? ? ?HPI was taken from Dr. Louanne Belton: ?Lori Petersen [facility] is a 66 y.o. female Patient is a 66 years old female with past medical history of Down syndrome, heart failure, hypertension, dementia, hyperlipidemia presented to hospital with complaints of cough and shortness of breath.  Of note, patient was recently seen by her primary care physician as outpatient.  Initially patient had presented with some stuffy runny nose subsequently she had increasing shortness of breath.  She was noted to be hypoxic and EMS was called in.  Patient required nonrebreather mask in route to the hospital.  History is extremely limited due to patient's underlying dementia and Down syndrome and family not available.  ED provider had spoken with the PCP .  [facility]   No prior history of COPD. ?  ?In the ED, patient was tachycardic, tachypneic with a temperature of 100.2 ?F, lactate was elevated at 2.4 and had leukocytosis at 13.6.  Creatinine mildly elevated at 1.4.  She was in respiratory distress and was on nonrebreather mask initially but due to  had increased work of breathing with diffuse wheezing, she was subsequently placed on BiPAP.  EKG showed sinus tachycardia.  Chest-ray however did not show any focal abnormality.  Urinalysis showed turbid urine with significant glucose and protein and large leukocytes and WBC more than 50. Blood cultures were sent from the ED including urinalysis.  Code sepsis was activated in the ED.  COVID and influenza was negative.  Patient received IV fluid bolus followed by vancomycin and cefepime, IV Solu-Medrol 125 mg x 1. Patient was then considered for admission to the hospital for possible sepsis secondary to pneumonia with hypoxic respiratory failure. ? ? ?As per Dr. Jimmye Norman 5/3-05/24/21: Pt presented w/ shortness of breath found to be likely secondary to multifocal pneumonia. Pt was initially put on IV vanco, cefepime, bronchodilators,incentive  spirometry & supplemental oxygen. Pt started to make improvements so abxs were changed to IV rocephin, azithromycin. Pt's respiratory status then took a turn for the worse and pt was transferred to stepdown for increased work of breathing and was placed on BiPAP. Pt's respiratory status did not improve much with BiPAP, so lasix was given intermittently and then abxs were changed to IV zosyn & azithromycin was continue. IV steroids were also added. Pt's respiratory and mental status have improved over the last 24 hrs.  ? ? ?Lori Petersen [facility]  TIR:[id number] DOB: Apr 21, 1955 DOA: 05/17/2021 ?PCP: [facility] [name]  ? ? ?Assessment & Plan: ?  ?Principal Problem: ?  Acute respiratory failure with hypoxia (Lori Petersen) ?Active Problems: ?  Chronic diastolic CHF (congestive heart failure) ([place]) ?  Hypothyroid ?  Mixed Alzheimer's and vascular dementia (Lori Petersen) ?  Obesity, Class III, BMI 40-49.9 (morbid obesity) ([name]) ?  Pneumonia ?  Dementia (Lori Petersen) ?  HLD (hyperlipidemia) ?  Sepsis ([name]) ?  Respiratory failure ([name]) ? ?Assessment and Plan: ? ?  ?Acute hypoxic respiratory failure: intermittently on BiPAP. Continue on supplemental oxygen and wean as tolerated.  ?At baseline patient uses oxygen at nighttime only as per POA ?Currently patient is on 2L oxygen via , continue to wean down as per improvement ?  ?Mild metabolic encephalopathy: likely secondary to pneumonia. Has underlying dementia & Down's syndrome. CT head shows no acute intracranial abnormalities. Improving today  ?  ?Sepsis: met criteria w/ tachycardia, tachypnea, fever, leukocytosis & secondary to multifocal pneumonia & UTI. S/p IV azithromycin x 5 days, ceftriaxone x  3days. Sepsis resolved  ?Due to hypotension patient was on midodrine, BP stable now, discontinued midodrine ? ?Multifocal pneumonia: improving today. Continue on IV zosyn (started 5/7 & azithromycin, IV steroids & bronchodilators. D/c IV rocephin.  ?Continue Zosyn for total 7 days ?5/10  increased IV Solu-Medrol 40 every 12 hourly due to persistent wheezing and shortness of breath ?Started Mucinex 600 twice daily, Hycodan as needed for cough ?Chest x-ray: Mild vascular congestion without edema Mild bibasilar airspace disease with mild improvement on the right. Small bilateral effusions. Possible pneumonia or mild fluid overload. ?5/13 started prednisone tapering dose ? ?AKI: baseline Cr/GFR is unknown Cr is trending down from day prior  ? ?Hypernatremia: resolved  ? ?UTI: w/ hx left ureteral stent in the past with distal left ureteral stone with persistent mild left hydroureteronephrosis. Urine cx growing yeast, completed fluconazole course. Scheduled on 05/20/21 for urethral stone removal outpatient but this will need to be rescheduled  ? ? ?Unlikely bacteremia: blood cxs growing stap species, likely containment. Repeat blood cxs NGTD  ?  ?Dementia: w/ hx of down's syndrome. Poor historian. Continue on home dose of keppra  ? ?Likely chronic diastolic CHF: echo from 4/48/1856 showed EF 55 to 60% with no regional wall motion abnormality. ?  ?HLD: continue on statin   ?  ?Hypothyroidism: continue on home dose of levothyroxine  ? ?Thrombocytopenia: resolved  ? ?Prediabetic, hemoglobin A1c 5.9, hyperglycemia secondary to steroids, started Humalog sliding scale, monitor FSBG ? ? ?DVT prophylaxis: heparin  ?Code Status: full  ?Family Communication: Discussed with the POA over the phone on 5/11 ?Disposition Plan: likely d/c back to [facility]  ? ?Level of care: Progressive ? ?Status is: Inpatient ?Remains inpatient appropriate because: severity of illness, still short of breath and significantly wheezing.  Started tapering dose prednisone from tomorrow, plan to discharge her on Monday, patient is having some hyperglycemia due to steroids ? ? ?Consultants:  ? ? ?Procedures: ? ?Antimicrobials: zosyn 5/7--->5/13 ?azithromycin 5/5--5/9  ?Cefepime 5/2--5/5 ?Ceftriaxone 5/5--5/6 ? ? ? ?Subjective: ?No significant  overnight events, patient has underlying dementia and Down syndrome, unable to offer any complaints.  Patient seems to be improving, occasional wheezing, significant improvement noticed in the past few days. ? ? ? ? ?Objective: ?Vitals:  ? 05/28/21 0400 05/28/21 0450 05/28/21 0754 05/28/21 1322  ?BP: 132/64  (!) 136/103 123/73  ?Pulse:   (!) 49 66  ?Resp: _0 ?Temp: (!) 97.5 ?F (36.4 ?C)  97.7 ?F (36.5 ?C) 98 ?F (36.7 ?C)  ?TempSrc: Oral  Oral Axillary  ?SpO2: 92%  98% 94%  ?Weight:  99.1 kg    ?Height:      ? ? ?Intake/Output Summary (Last 24 hours) at 05/28/2021 1424 ?Last data filed at 05/28/2021 1036 ?Gross per 24 hour  ?Intake 1500 ml  ?Output 325 ml  ?Net 1175 ml  ? ?Filed Weights  ? 05/25/21 0414 05/26/21 0600 05/28/21 0450  ?Weight: 99.2 kg 98.7 kg 99.1 kg  ? ? ?Examination: ? ?General exam: mils SOB, Appears calm   ?Respiratory system: Bilateral crackles and wheezing, improved as compared to yesterday ?Cardiovascular system: S1 & S2+. No rubs or clicks  ?Gastrointestinal system: Abd is soft, NT, obese & hypoactive bowel sounds    ?Central nervous system: Alert and awake.  ?Psychiatry: judgement and insight appears at baseline. Flat mood and affect  ? ? ? ?Data Reviewed: I have personally reviewed following labs and imaging studies ? ?CBC: ?Recent Labs  ?Lab 05/24/21 ?0631 05/25/21 ?0701  05/26/21 ?0938 05/27/21 ?1829 05/28/21 ?0512  ?WBC 8.4 9.0 11.9* 12.1* 10.7*  ?HGB 10.1* 11.1* 10.2* 10.5* 10.6*  ?HCT 34.0* 38.1 34.4* 35.6* 35.6*  ?MCV 99.4 100.0 99.1 100.6* 100.0  ?PLT 194 174 188 181 195  ? ?Basic Metabolic Panel: ?Recent Labs  ?Lab 05/24/21 ?0631 05/25/21 ?0701 05/26/21 ?9371 05/27/21 ?6967 05/28/21 ?0512  ?NA 144 145 148* 147* 144  ?K 3.8 3.9 4.6 4.6 4.5  ?CL 107 111 112* 108 107  ?CO2 _0 33* 33*  ?GLUCOSE 171* 145* 199* 224* 239*  ?BUN 34* 36* 40* 42* 40*  ?CREATININE 1.59* 1.49* 1.38* 1.41* 1.22*  ?CALCIUM 8.3* 8.4* 8.2* 8.6* 8.4*  ?MG  --   --  2.4 2.4 2.4  ?PHOS  --   --  3.2 3.7 4.0   ? ?GFR: ?Estimated Creatinine Clearance: 56.6 mL/min (A) (by C-G formula based on SCr of 1.22 mg/dL (H)). ?Liver Function Tests: ?Recent Labs  ?Lab 05/25/21 ?0701  ?AST 24  ?ALT 30  ?ALKPHOS 50  ?BILITOT 0.2*  ?PROT 7.0  ?

## 2021-05-29 LAB — GLUCOSE, CAPILLARY
Glucose-Capillary: 185 mg/dL — ABNORMAL HIGH (ref 70–99)
Glucose-Capillary: 162 mg/dL — ABNORMAL HIGH (ref 70–99)
Glucose-Capillary: 228 mg/dL — ABNORMAL HIGH (ref 70–99)
Glucose-Capillary: 233 mg/dL — ABNORMAL HIGH (ref 70–99)

## 2021-05-29 LAB — BASIC METABOLIC PANEL
Anion gap: 10 (ref 5–15)
BUN: 39 mg/dL — ABNORMAL HIGH (ref 8–23)
CO2: 32 mmol/L (ref 22–32)
Calcium: 8.4 mg/dL — ABNORMAL LOW (ref 8.9–10.3)
Chloride: 98 mmol/L (ref 98–111)
Creatinine, Ser: 1.2 mg/dL — ABNORMAL HIGH (ref 0.44–1.00)
GFR, Estimated: 50 mL/min — ABNORMAL LOW (ref >60)
Glucose, Bld: 216 mg/dL — ABNORMAL HIGH (ref 70–99)
Potassium: 4 mmol/L (ref 3.5–5.1)
Sodium: 140 mmol/L (ref 135–145)

## 2021-05-29 LAB — CBC
HCT: 31.8 % — ABNORMAL LOW (ref 36.0–46.0)
Hemoglobin: 9.4 g/dL — ABNORMAL LOW (ref 12.0–15.0)
MCH: 29.7 pg (ref 26.0–34.0)
MCHC: 29.6 g/dL — ABNORMAL LOW (ref 30.0–36.0)
MCV: 100.6 fL — ABNORMAL HIGH (ref 80.0–100.0)
Platelets: 178 10*3/uL (ref 150–400)
RBC: 3.16 MIL/uL — ABNORMAL LOW (ref 3.87–5.11)
RDW: 17.7 % — ABNORMAL HIGH (ref 11.5–15.5)
WBC: 10.3 10*3/uL (ref 4.0–10.5)
nRBC: 0.2 % (ref 0.0–0.2)

## 2021-05-29 MED ORDER — ENSURE ENLIVE PO LIQD
237.0000 mL | Freq: Three times a day (TID) | ORAL | Status: DC
  Administered 2021-05-29 – 2021-05-30 (×5): 237 mL via ORAL

## 2021-05-29 NOTE — Plan of Care (Signed)

## 2021-05-29 NOTE — Progress Notes (Signed)
?PROGRESS NOTE ? ? ?HPI was taken from Dr. Louanne Belton: ?Lori Petersen [facility] is a 66 y.o. female Patient is a 66 years old female with past medical history of Down syndrome, heart failure, hypertension, dementia, hyperlipidemia presented to hospital with complaints of cough and shortness of breath.  Of note, patient was recently seen by her primary care physician as outpatient.  Initially patient had presented with some stuffy runny nose subsequently she had increasing shortness of breath.  She was noted to be hypoxic and EMS was called in.  Patient required nonrebreather mask in route to the hospital.  History is extremely limited due to patient's underlying dementia and Down syndrome and family not available.  ED provider had spoken with the PCP .  [facility].   No prior history of COPD. ?  ?In the ED, patient was tachycardic, tachypneic with a temperature of 100.2 ?F, lactate was elevated at 2.4 and had leukocytosis at 13.6.  Creatinine mildly elevated at 1.4.  She was in respiratory distress and was on nonrebreather mask initially but due to  had increased work of breathing with diffuse wheezing, she was subsequently placed on BiPAP.  EKG showed sinus tachycardia.  Chest-ray however did not show any focal abnormality.  Urinalysis showed turbid urine with significant glucose and protein and large leukocytes and WBC more than 50. Blood cultures were sent from the ED including urinalysis.  Code sepsis was activated in the ED.  COVID and influenza was negative.  Patient received IV fluid bolus followed by vancomycin and cefepime, IV Solu-Medrol 125 mg x 1. Patient was then considered for admission to the hospital for possible sepsis secondary to pneumonia with hypoxic respiratory failure. ? ? ?As per Dr. Jimmye Norman 5/3-05/24/21: Pt presented w/ shortness of breath found to be likely secondary to multifocal pneumonia. Pt was initially put on IV vanco, cefepime, bronchodilators,incentive  spirometry & supplemental oxygen. Pt started to make improvements so abxs were changed to IV rocephin, azithromycin. Pt's respiratory status then took a turn for the worse and pt was transferred to stepdown for increased work of breathing and was placed on BiPAP. Pt's respiratory status did not improve much with BiPAP, so lasix was given intermittently and then abxs were changed to IV zosyn & azithromycin was continue. IV steroids were also added. Pt's respiratory and mental status have improved over the last 24 hrs.  ? ? ?Lori Petersen [facility]  ZTI:[id number] DOB: 10-Sep-1955 DOA: 05/17/2021 ?[facility]P: [facility] [place]  ? ? ?Assessment & Plan: ?  ?Principal Problem: ?  Acute respiratory failure with hypoxia ([place]) ?Active Problems: ?  Chronic diastolic CHF (congestive heart failure) ([place]) ?  Hypothyroid ?  Mixed Alzheimer's and vascular dementia ([place]) ?  Obesity, Class III, BMI 40-49.9 (morbid obesity) ([place]) ?  Pneumonia ?  Dementia ([place]) ?  HLD (hyperlipidemia) ?  Sepsis ([place]) ?  Respiratory failure ([place]) ? ?Assessment and Plan: ? ?  ?Acute hypoxic respiratory failure: intermittently on BiPAP. Continue on supplemental oxygen and wean as tolerated.  ?At baseline patient uses oxygen at nighttime only as per POA ?Currently patient is on 1-2L oxygen via [place], continue to wean down as per improvement ?  ?Mild metabolic encephalopathy: likely secondary to pneumonia. Has underlying dementia & Down's syndrome. CT head shows no acute intracranial abnormalities. Improving today  ?  ?Sepsis: met criteria w/ tachycardia, tachypnea, fever, leukocytosis & secondary to multifocal pneumonia & UTI. S/p IV azithromycin x 5 days, ceftriaxone x  3days. Sepsis resolved  ?Due to hypotension patient was on midodrine, BP stable now, discontinued midodrine ? ?Multifocal pneumonia: improving today. Continue on IV zosyn (started 5/7 & azithromycin, IV steroids & bronchodilators. D/c IV rocephin.  ?Continue Zosyn for total 7 days ?5/10  increased IV Solu-Medrol 40 every 12 hourly due to persistent wheezing and shortness of breath ?Started Mucinex 600 twice daily, Hycodan as needed for cough ?Chest x-ray: Mild vascular congestion without edema Mild bibasilar airspace disease with mild improvement on the right. Small bilateral effusions. Possible pneumonia or mild fluid overload. ?5/13 started prednisone tapering dose ? ?AKI: baseline Cr/GFR is unknown Cr is trending down from day prior  ? ?Hypernatremia: resolved  ? ?UTI: w/ hx left ureteral stent in the past with distal left ureteral stone with persistent mild left hydroureteronephrosis. Urine cx growing yeast, completed fluconazole course. Scheduled on 05/20/21 for urethral stone removal outpatient but this will need to be rescheduled  ? ? ?Unlikely bacteremia: blood cxs growing stap species, likely containment. Repeat blood cxs NGTD  ?  ?Dementia: w/ hx of down's syndrome. Poor historian. Continue on home dose of keppra  ? ?Likely chronic diastolic CHF: echo from 2/94/7654 showed EF 55 to 60% with no regional wall motion abnormality. ?  ?HLD: continue on statin   ?  ?Hypothyroidism: continue on home dose of levothyroxine  ? ?Thrombocytopenia: resolved  ? ?Prediabetic, hemoglobin A1c 5.9, hyperglycemia secondary to steroids, started Humalog sliding scale, monitor FSBG ? ? ?DVT prophylaxis: heparin  ?Code Status: full  ?Family Communication: Discussed with the POA over the phone on 5/11 ?Disposition Plan: likely d/c back to [facility]  ? ?Level of care: Progressive ? ?Status is: Inpatient ?Remains inpatient appropriate because: severity of illness, still short of breath and significantly wheezing.  Started tapering dose prednisone from tomorrow, plan to discharge her on Monday, patient is having some hyperglycemia due to steroids ? ? ?Consultants:  ? ? ?Procedures: ? ?Antimicrobials: zosyn 5/7--->5/13 ?azithromycin 5/5--5/9  ?Cefepime 5/2--5/5 ?Ceftriaxone 5/5--5/6 ? ? ? ?Subjective: ?No significant  overnight events, patient has underlying dementia and Down syndrome, unable to offer any complaints.  Patient seems to be improving, no significant wheezing appreciated. ? ? ?Objective: ?Vitals:  ? 05/29/21 0420 05/29/21 0616 05/29/21 0745 05/29/21 1203  ?BP: 110/67  114/66 118/65  ?Pulse: (!) 49 (!) 57 (!) 53 68  ?Resp: 11 14 18 16   ?Temp: 97.9 ?F (36.6 ?C)  (!) 97.5 ?F (36.4 ?C) 97.6 ?F (36.4 ?C)  ?TempSrc: Axillary     ?SpO2: 99% 97% 100% 94%  ?Weight: 97.6 kg     ?Height:      ? ? ?Intake/Output Summary (Last 24 hours) at 05/29/2021 1406 ?Last data filed at 05/29/2021 1010 ?Gross per 24 hour  ?Intake 1692.7 ml  ?Output --  ?Net 1692.7 ml  ? ?Filed Weights  ? 05/26/21 0600 05/28/21 0450 05/29/21 0420  ?Weight: 98.7 kg 99.1 kg 97.6 kg  ? ? ?Examination: ? ?General exam: mils SOB, Appears calm   ?Respiratory system: Clear to auscultation bilaterally, no significant wheezing appreciated ?Cardiovascular system: S1 & S2+. No rubs or clicks  ?Gastrointestinal system: Abd is soft, NT, obese & hypoactive bowel sounds    ?Central nervous system: Alert and awake.  ?Psychiatry: judgement and insight appears at baseline. Flat mood and affect  ? ? ? ?Data Reviewed: I have personally reviewed following labs and imaging studies ? ?CBC: ?Recent Labs  ?Lab 05/25/21 ?0701 05/26/21 ?6503 05/27/21 ?5465 05/28/21 ?6812 05/29/21 ?0458  ?WBC 9.0  11.9* 12.1* 10.7* 10.3  ?HGB 11.1* 10.2* 10.5* 10.6* 9.4*  ?HCT 38.1 34.4* 35.6* 35.6* 31.8*  ?MCV 100.0 99.1 100.6* 100.0 100.6*  ?PLT 174 188 181 195 178  ? ?Basic Metabolic Panel: ?Recent Labs  ?Lab 05/25/21 ?0701 05/26/21 ?5259 05/27/21 ?1028 05/28/21 ?9022 05/29/21 ?0458  ?NA 145 148* 147* 144 140  ?K 3.9 4.6 4.6 4.5 4.0  ?CL 111 112* 108 107 98  ?CO2 26 31 33* 33* 32  ?GLUCOSE 145* 199* 224* 239* 216*  ?BUN 36* 40* 42* 40* 39*  ?CREATININE 1.49* 1.38* 1.41* 1.22* 1.20*  ?CALCIUM 8.4* 8.2* 8.6* 8.4* 8.4*  ?MG  --  2.4 2.4 2.4  --   ?PHOS  --  3.2 3.7 4.0  --   ? ?GFR: ?Estimated Creatinine  Clearance: 57.1 mL/min (A) (by C-G formula based on SCr of 1.2 mg/dL (H)). ?Liver Function Tests: ?Recent Labs  ?Lab 05/25/21 ?0701  ?AST 24  ?ALT 30  ?ALKPHOS 50  ?BILITOT 0.2*  ?PROT 7.0  ?ALBUMIN 2.6*  ? ?No results f

## 2021-05-30 LAB — CBC
HCT: 32.7 % — ABNORMAL LOW (ref 36.0–46.0)
Hemoglobin: 9.8 g/dL — ABNORMAL LOW (ref 12.0–15.0)
MCH: 29.6 pg (ref 26.0–34.0)
MCHC: 30 g/dL (ref 30.0–36.0)
MCV: 98.8 fL (ref 80.0–100.0)
Platelets: 197 10*3/uL (ref 150–400)
RBC: 3.31 MIL/uL — ABNORMAL LOW (ref 3.87–5.11)
RDW: 18.2 % — ABNORMAL HIGH (ref 11.5–15.5)
WBC: 10.9 10*3/uL — ABNORMAL HIGH (ref 4.0–10.5)
nRBC: 0 % (ref 0.0–0.2)

## 2021-05-30 LAB — BASIC METABOLIC PANEL
Anion gap: 8 (ref 5–15)
BUN: 39 mg/dL — ABNORMAL HIGH (ref 8–23)
CO2: 33 mmol/L — ABNORMAL HIGH (ref 22–32)
Calcium: 8.4 mg/dL — ABNORMAL LOW (ref 8.9–10.3)
Chloride: 102 mmol/L (ref 98–111)
Creatinine, Ser: 1.19 mg/dL — ABNORMAL HIGH (ref 0.44–1.00)
GFR, Estimated: 51 mL/min — ABNORMAL LOW (ref >60)
Glucose, Bld: 253 mg/dL — ABNORMAL HIGH (ref 70–99)
Potassium: 4.3 mmol/L (ref 3.5–5.1)
Sodium: 143 mmol/L (ref 135–145)

## 2021-05-30 LAB — GLUCOSE, CAPILLARY
Glucose-Capillary: 179 mg/dL — ABNORMAL HIGH (ref 70–99)
Glucose-Capillary: 233 mg/dL — ABNORMAL HIGH (ref 70–99)

## 2021-05-30 MED ORDER — PREDNISONE 20 MG PO TABS: ORAL_TABLET | ORAL | 0 refills | Status: DC

## 2021-05-30 NOTE — Progress Notes (Signed)
Report called to RN at [facility]. [facility]. Discharge instructions sent with patient.  ?Sabra Heck, RN  ?

## 2021-05-30 NOTE — Discharge Summary (Signed)
Physician Discharge Summary  ?Lori Petersen [facility] LAG:[id number] DOB: June 25, 1955 DOA: 05/17/2021 ? ?PCP: [facility] [place] ? ?Admit date: 05/17/2021 ? ?Discharge date: 05/30/2021 ? ?Admitted From: SNF ?Disposition: SNF ? ?Recommendations for Outpatient Follow-up:  ?Follow up with PCP in 1-2 weeks ?Please obtain BMP/CBC in one week ?Patient is being discharged to SNF for rehabilitation. ?Advised to take prednisone taper as directed. ?Advised to continue supplemental oxygen and wean as tolerated. ? ?Home Health: None ?Equipment/Devices: Home oxygen  ? ?Discharge Condition: Stable ?CODE STATUS:Full code ?Diet recommendation: Dysphagia 1 diet ? ?Brief Summary/ Hospital Course: ?This 66 y.o. female  with past medical history of Down syndrome, heart failure, hypertension, dementia, hyperlipidemia presented to hospital with complaints of cough and shortness of breath.  She was noted to be hypoxic and EMS was called in.   [facility]   No prior history of COPD.  ?Pt presented w/ shortness of breath found to be likely secondary to multifocal pneumonia. Pt was initially started on IV vanco, cefepime, bronchodilators,incentive spirometry & supplemental oxygen. Pt started to make improvements so abxs were changed to IV rocephin, azithromycin. Pt's respiratory status then took a turn for the worse and pt was transferred to stepdown for increased work of breathing and was placed on BiPAP. Pt's respiratory status did not improve much with BiPAP, so lasix was given intermittently and then abxs were changed to IV zosyn & azithromycin was continue. IV steroids were also added. Pt's respiratory and mental status have improved over the last 24 hrs.  Patient has significantly improved during hospitalization and has completed course of antibiotics.  Patient back to the baseline mental status.  Patient is being discharged to SNF. ? ? ?Discharge Diagnoses:  ?Principal Problem: ?  Acute respiratory  failure with hypoxia ([place]) ?Active Problems: ?  Chronic diastolic CHF (congestive heart failure) ([place]) ?  Hypothyroid ?  Mixed Alzheimer's and vascular dementia ([place]) ?  Obesity, Class III, BMI 40-49.9 (morbid obesity) ([place]) ?  Pneumonia ?  Dementia ([place]) ?  HLD (hyperlipidemia) ?  Sepsis ([place]) ?  Respiratory failure ([place]) ? ?Acute hypoxic respiratory failure: intermittently on BiPAP.  ?Continue on supplemental oxygen and wean as tolerated.  ?At baseline patient uses oxygen at nighttime only as per POA ?Currently patient is on 1-2L oxygen via [place], continue to wean down as per improvement. ?  ?Mild metabolic encephalopathy: > Improved. ?Likely secondary to pneumonia. Has underlying dementia & Down's syndrome.  ?CT head shows no acute intracranial abnormalities. Improving today  ?  ?Sepsis: > Resolved. ?met criteria w/ tachycardia, tachypnea, fever, leukocytosis & secondary to multifocal pneumonia & UTI.  ?S/p IV azithromycin x 5 days, ceftriaxone x 3days. Sepsis resolved.  ?Due to hypotension patient was on midodrine, BP stable now, discontinued midodrine. ?  ?Multifocal pneumonia: ?Continued on IV zosyn (started 5/7 & azithromycin, IV steroids & bronchodilators. D/c IV rocephin.  ?Continue Zosyn for total 7 days ?5/10 increased IV Solu-Medrol 40 every 12 hourly due to persistent wheezing and shortness of breath ?Started Mucinex 600 twice daily, Hycodan as needed for cough ?Chest x-ray: Mild vascular congestion without edema Mild bibasilar airspace disease with mild improvement on the right. Small bilateral effusions. Possible pneumonia or mild fluid overload. ?5/13 started prednisone tapering dose. ?  ?AKI: baseline: Cr/GFR is unknown Cr is trending down from day prior . ?  ?Hypernatremia: resolved. ?  ?UTI: w/ hx left ureteral stent in the past with distal left ureteral stone with  persistent mild left hydroureteronephrosis.  ?Urine cx growing yeast, completed fluconazole course.  ?Scheduled on 05/20/21 for urethral  stone removal outpatient but this will need to be rescheduled. ?  ?  ?Unlikely bacteremia: blood cxs growing stap species, likely containment. Repeat blood cxs NGTD  ?  ?Dementia: w/ hx of down's syndrome. Poor historian. Continue on home dose of keppra. ?  ?Likely chronic diastolic CHF: echo from 07/09/7626 showed EF 55 to 60% with no regional wall motion abnormality. ?  ?HLD: continue on statin   ?  ?Hypothyroidism: continue on home dose of levothyroxine  ?  ?Thrombocytopenia: resolved  ?  ?Prediabetic, hemoglobin A1c 5.9, hyperglycemia secondary to steroids, started Humalog sliding scale, monitor FSBG ? ?Discharge Instructions ? ?Discharge Instructions   ? ? Call MD for:  difficulty breathing, headache or visual disturbances   Complete by: As directed ?  ? Call MD for:  persistant dizziness or light-headedness   Complete by: As directed ?  ? Call MD for:  persistant nausea and vomiting   Complete by: As directed ?  ? Diet - low sodium heart healthy   Complete by: As directed ?  ? Discharge instructions   Complete by: As directed ?  ? Advised to follow-up with primary care physician in 1 week. ?Patient is being discharged to SNF for rehabilitation. ?Advised to take prednisone taper as directed. ?Advised to continue supplemental oxygen and wean as tolerated.  ? Discharge wound care:   Complete by: As directed ?  ? Follow-up wound care at nursing home.  ? Increase activity slowly   Complete by: As directed ?  ? ?  ? ?Allergies as of 05/30/2021   ?No Known Allergies ?  ? ?  ?Medication List  ?  ? ?STOP taking these medications   ? ?tolnaftate 1 % spray ?Commonly known as: TINACTIN ?  ? ?  ? ?TAKE these medications   ? ?albuterol (2.5 MG/3ML) 0.083% nebulizer solution ?Commonly known as: PROVENTIL ?Take 2.5 mg by nebulization every 4 (four) hours as needed for wheezing or shortness of breath. ?  ?aspirin EC 81 MG tablet ?Take 81 mg by mouth daily. ?  ?budesonide-formoterol 80-4.5 MCG/ACT inhaler ?Commonly known as:  SYMBICORT ?Inhale 2 puffs into the lungs 2 (two) times daily. With spacer device ?  ?cholecalciferol 25 MCG (1000 UNIT) tablet ?Commonly known as: VITAMIN D3 ?Take 2,000 Units by mouth daily. ?  ?empagliflozin 10 MG Tabs tablet ?Commonly known as: JARDIANCE ?Take 10 mg by mouth daily. ?  ?latanoprost 0.005 % ophthalmic solution ?Commonly known as: XALATAN ?Place 1 drop into the left eye at bedtime. ?  ?levETIRAcetam 250 MG tablet ?Commonly known as: KEPPRA ?Take 250 mg by mouth 2 (two) times daily. (Take with 511m tablet to equal 7516mtotal) ?  ?levETIRAcetam 500 MG tablet ?Commonly known as: KEPPRA ?Take 500 mg by mouth 2 (two) times daily. (Take with 25065mablet to equal 750m46mtal) ?  ?levothyroxine 100 MCG tablet ?Commonly known as: SYNTHROID ?Take 100 mcg by mouth daily before breakfast. ?  ?midazolam 5 MG/5ML Soln injection ?Commonly known as: VERSED ?Inject 2 mg into the muscle See admin instructions. Per MAR: Inject 2ml 43mramuscularly single dose as needed for seizure lasting longer than 5 minutes. ?  ?omeprazole 20 MG capsule ?Commonly known as: PRILOSEC ?Take 20 mg by mouth daily. ?  ?pravastatin 40 MG tablet ?Commonly known as: PRAVACHOL ?Take 40 mg by mouth at bedtime. ?  ?predniSONE 20 MG tablet ?Commonly known as: DELTASONE ?  Take prednisone 30 mg daily for 3 days then prednisone 20 mg daily for 3 days then prednisone 10 mg daily for 3 days and discontinue. ?  ?senna 8.6 MG Tabs tablet ?Commonly known as: SENOKOT ?Take 2 tablets by mouth in the morning and at bedtime. ?  ?vitamin B-12 1000 MCG tablet ?Commonly known as: CYANOCOBALAMIN ?Take 1,000 mcg by mouth daily. ?  ?Zinc Oxide 13 % Crea ?Apply 1 application. topically 3 (three) times daily as needed (skin protection). Apply to gluteal area 3X daily with toileting - to protect skin ?  ?Desitin 13 % Crea ?Generic drug: Zinc Oxide ?Apply 1 application. topically in the morning and at bedtime. ?  ? ?  ? ?  ?  ? ? ?  ?Discharge Care Instructions   ?(From admission, onward)  ?  ? ? ?  ? ?  Start     Ordered  ? 05/30/21 0000  Discharge wound care:       ?Comments: Follow-up wound care at nursing home.  ? 05/30/21 1032  ? ?  ?  ? ?  ? ? Follow-up [facility]

## 2021-05-30 NOTE — Discharge Instructions (Signed)
Advised to follow-up with primary care physician in 1 week. ?Patient is being discharged to SNF for rehabilitation. ?Advised to take prednisone taper as directed. ?Advised to continue supplemental oxygen and wean as tolerated. ?

## 2021-05-30 NOTE — Care Management Important Message (Signed)
Important Message ? ?Patient Details  ?Name: Lori Petersen [facility] ?MRN: [id number] ?Date of Birth: 1955/02/01 ? ? ?Medicare Important Message Given:  Yes ? ? ? ? ?[name] ?05/30/2021, 10:35 AM ?

## 2021-05-30 NOTE — Progress Notes (Signed)
RT walked into patients room to assess why bipap alarm was going off and found that patient had pulled bipap off and was holding mask and tubing in her hand. Placed patient on her [place] at 1.5L for the day. ?

## 2021-05-30 NOTE — TOC Transition Note (Addendum)
Transition of Care (TOC) - CM/SW Discharge Note ? ? ?Patient Details  ?Name: Lori Petersen [facility] ?MRN: [id number] ?Date of Birth: [birth year] ? ?Transition of Care (TOC) CM/SW Contact:  ?Gildardo Griffes, LCSW ?Phone Number: ?05/30/2021, 1:04 PM ? ? ?Clinical Narrative:    ? ?Patient to discharge to [facility] today, [facility] to provide transportation (robin with [facility] has been notified at [phone]).  ? ?RN to call report to [facility] at [phone] ? ?Attempted to lvm with patient's legal guardian dorothy, no answer and mailbox full. ? ?Final next level of care: Skilled Nursing Facility ?Barriers to Discharge: No Barriers Identified ? ? ?Patient Goals and CMS Choice ?Patient states their goals for this hospitalization and ongoing recovery are:: to go home ?CMS Medicare.gov Compare Post Acute Care list provided to:: Legal Guardian ?  ? ?Discharge Placement ?  ?           ?  ?  ?  ?Patient and family notified of of transfer: 05/30/21 ? ?Discharge Plan and Services ?  ?  ?           ?  ?  ?  ?  ?  ?  ?  ?  ?  ?  ? ?Social Determinants of Health (SDOH) Interventions ?  ? ? ?Readmission Risk Interventions ?   ? View : No data to display.  ?  ?  ?  ? ? ? ? ? ?

## 2021-05-30 NOTE — Plan of Care (Signed)
  Problem: Nutrition: Goal: Adequate nutrition will be maintained Outcome: Progressing   Problem: Elimination: Goal: Will not experience complications related to bowel motility Outcome: Progressing Goal: Will not experience complications related to urinary retention Outcome: Progressing   

## 2021-06-22 ENCOUNTER — Inpatient Hospital Stay: Admit: 2021-06-22 | Payer: Medicare (Managed Care)

## 2021-06-23 ENCOUNTER — Encounter
Admission: RE | Admit: 2021-06-23 | Discharge: 2021-06-23 | Disposition: A | Discharge status: Home / Self Care | Payer: Medicare (Managed Care) | Source: Ambulatory Visit | Attending: Urology | Admitting: Urology

## 2021-06-23 ENCOUNTER — Other Ambulatory Visit: Payer: Self-pay

## 2021-06-23 DIAGNOSIS — N201 Calculus of ureter: Secondary | ICD-10-CM

## 2021-06-23 DIAGNOSIS — Z01818 Encounter for other preprocedural examination: Secondary | ICD-10-CM

## 2021-06-23 HISTORY — DX: Respiratory failure, unspecified, unspecified whether with hypoxia or hypercapnia: J96.90

## 2021-06-23 HISTORY — DX: Thrombocytopenia, unspecified: D69.6

## 2021-06-23 HISTORY — DX: Acute kidney failure, unspecified: N17.9

## 2021-06-23 HISTORY — DX: Personal history of urinary calculi: Z87.442

## 2021-06-23 NOTE — Pre-Procedure Instructions (Signed)
Called Sherri Mouw at [facility] and had to leave her a message. Called to inform her of pts surgery tomorrow (06-24-21) and of pts arrival time 0830 to make sure that [facility] will be transporting pt to the hospital from [facility] and back to [facility] after her surgery. Awaiting return call. Katie in PAT has also been informed of this so that if Sherri calls back and I am unavailable she can relay all this info to [facility]

## 2021-06-23 NOTE — Pre-Procedure Instructions (Addendum)
Called [facility] yesterday for MARs to be faxed over to PAT for pts surgery on 06-24-21. Never received. Called [facility] again today for MARs to be faxed over. Sabrina at [facility] informed me that I have to go thru [facility] to get her med list faxed over. Sabrina at [facility] gave me the number at [facility] for the nurse so I can get the MARs. Misty Stanley RN at [facility] was called [phone]) Misty Stanley to fax over med list to PAT. Misty Stanley also informed of pts arrival time for her surgery tomorrow> Joya Gaskins time of 0830(which was given to me by [facility] in [facility])  to have pt here at the hospital as [facility] will be bringing her tomorrow.

## 2021-06-23 NOTE — Pre-Procedure Instructions (Signed)
Spoke with pts legal gaurdian [name] who said she was not aware pt was having surgery tomorrow and was pretty upset that no one had contacted her to let her know this. I told her that PAT had called her back in April to get verbal surgery consents signed which was done over the phone. I explained to [name] that this was the same surgery that we talked with her about back in April, its just pts surgery got cancelled due to [facility] letting pt eat the morning of surgery and her procedure got cancelled. Ms Wonda Olds was still upset that no one had told her this and I explained that should have been something that her surgeons office should have notified her about. Ms Wonda Olds agreed and is now aware why pts surgery was rescheduled

## 2021-06-23 NOTE — Patient Instructions (Addendum)
Your procedure is scheduled on:06-24-21 Friday Report to the Registration Desk on the 1st floor of the [facility]. Then proceed to the 2nd floor Surgery Desk. Arrive @ 8:30 am  REMEMBER: Instructions that are not followed completely may result in serious medical risk, up to and including death; or upon the discretion of your surgeon and anesthesiologist your surgery may need to be rescheduled.  Do not eat food OR drink any liquids after midnight the night before surgery.  No gum chewing, lozengers or hard candies.  TAKE THESE MEDICATIONS THE MORNING OF SURGERY WITH A SIP OF WATER: -levETIRAcetam (KEPPRA)  -levothyroxine (SYNTHROID)  -omeprazole (PRILOSEC) -budesonide-formoterol (SYMBICORT)  Use albuterol (PROVENTIL) Nebulizer the day of surgery  Continue 81 mg Aspirin-DO NOT TAKE THE DAY OF SURGERY  One week prior to surgery: Stop Anti-inflammatories (NSAIDS) such as Advil, Aleve, Ibuprofen, Motrin, Naproxen, Naprosyn and Aspirin based products such as Excedrin, Goodys Powder, BC Powder. You may however, continue to take Tylenol if needed for pain up until the day of surgery.  No Alcohol for 24 hours before or after surgery.  No Smoking including e-cigarettes for 24 hours prior to surgery.  No chewable tobacco products for at least 6 hours prior to surgery.  No nicotine patches on the day of surgery.  Do not use any "recreational" drugs for at least a week prior to your surgery.  Please be advised that the combination of cocaine and anesthesia may have negative outcomes, up to and including death. If you test positive for cocaine, your surgery will be cancelled.  On the morning of surgery brush your teeth with toothpaste and water, you may rinse your mouth with mouthwash if you wish. Do not swallow any toothpaste or mouthwash.  Do not wear jewelry, make-up, hairpins, clips or nail polish.  Do not wear lotions, powders, or perfumes.   Do not shave body from the neck down 48  hours prior to surgery just in case you cut yourself which could leave a site for infection.  Also, freshly shaved skin may become irritated if using the CHG soap.  Contact lenses, hearing aids and dentures may not be worn into surgery.  Do not bring valuables to the hospital. [facility] is not responsible for any missing/lost belongings or valuables.   Notify your doctor if there is any change in your medical condition (cold, fever, infection).  Wear comfortable clothing (specific to your surgery type) to the hospital.  After surgery, you can help prevent lung complications by doing breathing exercises.  Take deep breaths and cough every 1-2 hours. Your doctor may order a device called an Incentive Spirometer to help you take deep breaths. When coughing or sneezing, hold a pillow firmly against your incision with both hands. This is called "splinting." Doing this helps protect your incision. It also decreases belly discomfort.  If you are being admitted to the hospital overnight, leave your suitcase in the car. After surgery it may be brought to your room.  If you are being discharged the day of surgery, you will not be allowed to drive home. You will need a responsible adult (18 years or older) to drive you home and stay with you that night.   If you are taking public transportation, you will need to have a responsible adult (18 years or older) with you. Please confirm with your physician that it is acceptable to use public transportation.   Please call the Pre-admissions Testing Dept. at [phone] if you have any questions  about these instructions.  Surgery Visitation Policy:  Patients undergoing a surgery or procedure may have two family members or support persons with them as long as the person is not COVID-19 positive or experiencing its symptoms.

## 2021-06-23 NOTE — Pre-Procedure Instructions (Signed)
Never received med list from [facility]. I faxed over surgery instructions to [facility] and spoke with Wynona Canes who said they had not received the surgery instructions. I refaxed it again to [phone] which she said was the correct number. Reiterated to Christine NPO after midnight tonight which includes no food or liquids. Wynona Canes said she will make note on chart for the rest of the staff to see

## 2021-06-24 ENCOUNTER — Ambulatory Visit: Payer: Medicare (Managed Care) | Admitting: Urgent Care

## 2021-06-24 ENCOUNTER — Ambulatory Visit: Payer: Medicare (Managed Care) | Admitting: Anesthesiology

## 2021-06-24 ENCOUNTER — Ambulatory Visit: Payer: Medicare (Managed Care)

## 2021-06-24 ENCOUNTER — Ambulatory Visit
Admission: RE | Admit: 2021-06-24 | Discharge: 2021-06-24 | Disposition: A | Discharge status: Home / Self Care | Payer: Medicare (Managed Care) | Source: Home / Self Care | Attending: Urology | Admitting: Urology

## 2021-06-24 ENCOUNTER — Other Ambulatory Visit: Payer: Self-pay

## 2021-06-24 ENCOUNTER — Encounter
Admission: RE | Disposition: A | Discharge status: Home / Self Care | Payer: Self-pay | Source: Home / Self Care | Attending: Urology

## 2021-06-24 ENCOUNTER — Encounter: Payer: Self-pay | Admitting: Urology

## 2021-06-24 DIAGNOSIS — Z87440 Personal history of urinary (tract) infections: Secondary | ICD-10-CM | POA: Insufficient documentation

## 2021-06-24 DIAGNOSIS — E039 Hypothyroidism, unspecified: Secondary | ICD-10-CM | POA: Insufficient documentation

## 2021-06-24 DIAGNOSIS — K219 Gastro-esophageal reflux disease without esophagitis: Secondary | ICD-10-CM | POA: Insufficient documentation

## 2021-06-24 DIAGNOSIS — Z01818 Encounter for other preprocedural examination: Secondary | ICD-10-CM

## 2021-06-24 DIAGNOSIS — N200 Calculus of kidney: Secondary | ICD-10-CM | POA: Diagnosis not present

## 2021-06-24 DIAGNOSIS — Q909 Down syndrome, unspecified: Secondary | ICD-10-CM | POA: Insufficient documentation

## 2021-06-24 DIAGNOSIS — R6250 Unspecified lack of expected normal physiological development in childhood: Secondary | ICD-10-CM | POA: Insufficient documentation

## 2021-06-24 DIAGNOSIS — I5032 Chronic diastolic (congestive) heart failure: Secondary | ICD-10-CM | POA: Insufficient documentation

## 2021-06-24 DIAGNOSIS — R569 Unspecified convulsions: Secondary | ICD-10-CM | POA: Insufficient documentation

## 2021-06-24 DIAGNOSIS — F0390 Unspecified dementia without behavioral disturbance: Secondary | ICD-10-CM | POA: Insufficient documentation

## 2021-06-24 DIAGNOSIS — N201 Calculus of ureter: Secondary | ICD-10-CM

## 2021-06-24 DIAGNOSIS — N132 Hydronephrosis with renal and ureteral calculous obstruction: Secondary | ICD-10-CM | POA: Insufficient documentation

## 2021-06-24 DIAGNOSIS — Z87442 Personal history of urinary calculi: Secondary | ICD-10-CM | POA: Insufficient documentation

## 2021-06-24 DIAGNOSIS — E785 Hyperlipidemia, unspecified: Secondary | ICD-10-CM | POA: Insufficient documentation

## 2021-06-24 DIAGNOSIS — J45909 Unspecified asthma, uncomplicated: Secondary | ICD-10-CM | POA: Insufficient documentation

## 2021-06-24 DIAGNOSIS — A4151 Sepsis due to Escherichia coli [E. coli]: Secondary | ICD-10-CM | POA: Diagnosis not present

## 2021-06-24 HISTORY — PX: CYSTOSCOPY/URETEROSCOPY/HOLMIUM LASER/STENT PLACEMENT: SHX6546

## 2021-06-24 LAB — GLUCOSE, CAPILLARY: Glucose-Capillary: 98 mg/dL (ref 70–99)

## 2021-06-24 SURGERY — CYSTOSCOPY/URETEROSCOPY/HOLMIUM LASER/STENT PLACEMENT
Anesthesia: General | Site: Ureter | Laterality: Left

## 2021-06-24 MED ORDER — LIDOCAINE HCL (CARDIAC) PF 100 MG/5ML IV SOSY
PREFILLED_SYRINGE | INTRAVENOUS | Status: DC | PRN
  Administered 2021-06-24: 80 mg via INTRAVENOUS

## 2021-06-24 MED ORDER — ROCURONIUM BROMIDE 10 MG/ML (PF) SYRINGE
PREFILLED_SYRINGE | INTRAVENOUS | Status: AC
  Filled 2021-06-24: qty 10

## 2021-06-24 MED ORDER — FENTANYL CITRATE (PF) 100 MCG/2ML IJ SOLN
INTRAMUSCULAR | Status: DC | PRN
  Administered 2021-06-24: 50 ug via INTRAVENOUS

## 2021-06-24 MED ORDER — ONDANSETRON HCL 4 MG/2ML IJ SOLN
INTRAMUSCULAR | Status: DC | PRN
  Administered 2021-06-24: 4 mg via INTRAVENOUS

## 2021-06-24 MED ORDER — SODIUM CHLORIDE 0.9 % IV SOLN: INTRAVENOUS | Status: DC

## 2021-06-24 MED ORDER — FLUCONAZOLE IN SODIUM CHLORIDE 200-0.9 MG/100ML-% IV SOLN
200.0000 mg | Freq: Once | INTRAVENOUS | Status: AC
  Administered 2021-06-24: 200 mg via INTRAVENOUS
  Filled 2021-06-24: qty 100

## 2021-06-24 MED ORDER — ACETAMINOPHEN 10 MG/ML IV SOLN
INTRAVENOUS | Status: AC
  Administered 2021-06-24: 1000 mg via INTRAVENOUS
  Filled 2021-06-24: qty 100

## 2021-06-24 MED ORDER — ACETAMINOPHEN 10 MG/ML IV SOLN: 1000.0000 mg | Freq: Once | INTRAVENOUS | Status: DC | PRN

## 2021-06-24 MED ORDER — SODIUM CHLORIDE 0.9 % IR SOLN
Status: DC | PRN
  Administered 2021-06-24: 1000 mL

## 2021-06-24 MED ORDER — CHLORHEXIDINE GLUCONATE 0.12 % MT SOLN
15.0000 mL | Freq: Once | OROMUCOSAL | Status: AC
Start: 2021-06-24 — End: 2021-06-24

## 2021-06-24 MED ORDER — FENTANYL CITRATE (PF) 100 MCG/2ML IJ SOLN
INTRAMUSCULAR | Status: AC
  Filled 2021-06-24: qty 2

## 2021-06-24 MED ORDER — OXYCODONE HCL 5 MG/5ML PO SOLN: 5.0000 mg | Freq: Once | ORAL | Status: DC | PRN

## 2021-06-24 MED ORDER — PHENYLEPHRINE 80 MCG/ML (10ML) SYRINGE FOR IV PUSH (FOR BLOOD PRESSURE SUPPORT)
PREFILLED_SYRINGE | INTRAVENOUS | Status: DC | PRN
  Administered 2021-06-24: 160 ug via INTRAVENOUS

## 2021-06-24 MED ORDER — OXYCODONE HCL 5 MG PO TABS: 5.0000 mg | ORAL_TABLET | Freq: Once | ORAL | Status: DC | PRN

## 2021-06-24 MED ORDER — FENTANYL CITRATE (PF) 100 MCG/2ML IJ SOLN: 25.0000 ug | INTRAMUSCULAR | Status: DC | PRN

## 2021-06-24 MED ORDER — ONDANSETRON HCL 4 MG/2ML IJ SOLN: 4.0000 mg | Freq: Once | INTRAMUSCULAR | Status: DC | PRN

## 2021-06-24 MED ORDER — DEXAMETHASONE SODIUM PHOSPHATE 10 MG/ML IJ SOLN
INTRAMUSCULAR | Status: AC
  Filled 2021-06-24: qty 1

## 2021-06-24 MED ORDER — PROPOFOL 10 MG/ML IV BOLUS
INTRAVENOUS | Status: AC
  Filled 2021-06-24: qty 20

## 2021-06-24 MED ORDER — CEFAZOLIN SODIUM-DEXTROSE 2-4 GM/100ML-% IV SOLN
INTRAVENOUS | Status: AC
  Filled 2021-06-24: qty 100

## 2021-06-24 MED ORDER — CEFAZOLIN SODIUM-DEXTROSE 2-4 GM/100ML-% IV SOLN
2.0000 g | INTRAVENOUS | Status: AC
  Administered 2021-06-24: 2 g via INTRAVENOUS

## 2021-06-24 MED ORDER — ONDANSETRON HCL 4 MG/2ML IJ SOLN
INTRAMUSCULAR | Status: AC
  Filled 2021-06-24: qty 2

## 2021-06-24 MED ORDER — ORAL CARE MOUTH RINSE: 15.0000 mL | Freq: Once | OROMUCOSAL | Status: AC

## 2021-06-24 MED ORDER — LABETALOL HCL 5 MG/ML IV SOLN
INTRAVENOUS | Status: AC
  Administered 2021-06-24: 5 mg via INTRAVENOUS
  Filled 2021-06-24: qty 4

## 2021-06-24 MED ORDER — DEXAMETHASONE SODIUM PHOSPHATE 10 MG/ML IJ SOLN
INTRAMUSCULAR | Status: DC | PRN
  Administered 2021-06-24: 5 mg via INTRAVENOUS

## 2021-06-24 MED ORDER — CHLORHEXIDINE GLUCONATE 0.12 % MT SOLN
OROMUCOSAL | Status: AC
  Administered 2021-06-24: 15 mL via OROMUCOSAL
  Filled 2021-06-24: qty 15

## 2021-06-24 MED ORDER — PROPOFOL 10 MG/ML IV BOLUS
INTRAVENOUS | Status: DC | PRN
  Administered 2021-06-24: 70 mg via INTRAVENOUS

## 2021-06-24 MED ORDER — ROCURONIUM BROMIDE 100 MG/10ML IV SOLN
INTRAVENOUS | Status: DC | PRN
  Administered 2021-06-24: 50 mg via INTRAVENOUS

## 2021-06-24 MED ORDER — CEPHALEXIN 500 MG PO CAPS: 500.0000 mg | ORAL_CAPSULE | Freq: Every day | ORAL | 0 refills | Status: DC

## 2021-06-24 MED ORDER — IOHEXOL 180 MG/ML  SOLN
INTRAMUSCULAR | Status: DC | PRN
  Administered 2021-06-24: 10 mL

## 2021-06-24 MED ORDER — LABETALOL HCL 5 MG/ML IV SOLN: 5.0000 mg | Freq: Once | INTRAVENOUS | Status: AC

## 2021-06-24 MED ORDER — LIDOCAINE HCL (PF) 2 % IJ SOLN
INTRAMUSCULAR | Status: AC
  Filled 2021-06-24: qty 5

## 2021-06-24 MED ORDER — PHENYLEPHRINE HCL-NACL 20-0.9 MG/250ML-% IV SOLN
INTRAVENOUS | Status: DC | PRN
  Administered 2021-06-24: 35 ug/min via INTRAVENOUS

## 2021-06-24 MED ORDER — SUGAMMADEX SODIUM 200 MG/2ML IV SOLN
INTRAVENOUS | Status: DC | PRN
  Administered 2021-06-24: 200 mg via INTRAVENOUS

## 2021-06-24 SURGICAL SUPPLY — 36 items
ADH LQ OCL WTPRF AMP STRL LF (MISCELLANEOUS) ×1
ADHESIVE MASTISOL STRL (MISCELLANEOUS) ×1 IMPLANT
BAG DRAIN CYSTO-URO LG1000N (MISCELLANEOUS) ×2 IMPLANT
BASKET ZERO TIP 1.9FR (BASKET) ×1 IMPLANT
BRUSH SCRUB EZ 1% IODOPHOR (MISCELLANEOUS) ×2 IMPLANT
BSKT STON RTRVL ZERO TP 1.9FR (BASKET) ×1
CATH URET FLEX-TIP 2 LUMEN 10F (CATHETERS) IMPLANT
CATH URETL OPEN 5X70 (CATHETERS) IMPLANT
CNTNR SPEC 2.5X3XGRAD LEK (MISCELLANEOUS)
CONT SPEC 4OZ STER OR WHT (MISCELLANEOUS)
CONT SPEC 4OZ STRL OR WHT (MISCELLANEOUS)
CONTAINER SPEC 2.5X3XGRAD LEK (MISCELLANEOUS) IMPLANT
DRAPE UTILITY 15X26 TOWEL STRL (DRAPES) ×2 IMPLANT
DRSG TEGADERM 2-3/8X2-3/4 SM (GAUZE/BANDAGES/DRESSINGS) ×1 IMPLANT
GLOVE BIOGEL PI IND STRL 7.5 (GLOVE) IMPLANT
GLOVE BIOGEL PI INDICATOR 7.5 (GLOVE) ×1
GLOVE SURG UNDER POLY LF SZ7.5 (GLOVE) ×2 IMPLANT
GOWN STRL REUS W/ TWL LRG LVL3 (GOWN DISPOSABLE) ×1 IMPLANT
GOWN STRL REUS W/ TWL XL LVL3 (GOWN DISPOSABLE) ×1 IMPLANT
GOWN STRL REUS W/TWL LRG LVL3 (GOWN DISPOSABLE) ×2
GOWN STRL REUS W/TWL XL LVL3 (GOWN DISPOSABLE) ×2
GUIDEWIRE STR DUAL SENSOR (WIRE) ×2 IMPLANT
INFUSOR MANOMETER BAG 3000ML (MISCELLANEOUS) ×1 IMPLANT
IV NS IRRIG 3000ML ARTHROMATIC (IV SOLUTION) ×2 IMPLANT
KIT TURNOVER CYSTO (KITS) ×2 IMPLANT
MAT HALF PREVALON HALF STRYKER (MISCELLANEOUS) ×1 IMPLANT
PACK CYSTO AR (MISCELLANEOUS) ×2 IMPLANT
SET CYSTO W/LG BORE CLAMP LF (SET/KITS/TRAYS/PACK) ×2 IMPLANT
SHEATH NAVIGATOR HD 12/14X36 (SHEATH) IMPLANT
STENT URET 6FRX24 CONTOUR (STENTS) ×1 IMPLANT
STENT URET 6FRX26 CONTOUR (STENTS) IMPLANT
SURGILUBE 2OZ TUBE FLIPTOP (MISCELLANEOUS) ×2 IMPLANT
SYR 10ML LL (SYRINGE) ×2 IMPLANT
TRACTIP FLEXIVA PULSE ID 200 (Laser) ×1 IMPLANT
VALVE UROSEAL ADJ ENDO (VALVE) IMPLANT
WATER STERILE IRR 500ML POUR (IV SOLUTION) ×2 IMPLANT

## 2021-06-24 NOTE — Op Note (Signed)
Date of procedure: 06/24/21  Preoperative diagnosis:  Left ureteral stone  Postoperative diagnosis:  Same  Procedure: Cystoscopy, left ureteroscopy, laser lithotripsy, left retrograde pyelogram with intraoperative interpretation, left ureteral stent placement  Surgeon: Legrand Rams, MD  Anesthesia: General  Complications: None  Intraoperative findings:  Uncomplicated fragmentation and basket extraction of 1 cm left distal ureteral stone, stent replaced with Dangler  EBL: None  Specimens: None  Drains: Left 6 French by 24 cm ureteral stent with Dangler  Indication: Lori Petersen [facility] is a 66 y.o. patient with developmental delay and dementia who previously presented in March 2023 with sepsis from urinary source and a left distal ureteral stone.  Her definitive ureteroscopy has been delayed multiple times secondary to challenges coordinating with her facility as well as pneumonia.  After reviewing the management options for treatment, they elected to proceed with the above surgical procedure(s). We have discussed the potential benefits and risks of the procedure, side effects of the proposed treatment, the likelihood of the patient achieving the goals of the procedure, and any potential problems that might occur during the procedure or recuperation. Informed consent has been obtained.  Description of procedure:  The patient was taken to the operating room and general anesthesia was induced. SCDs were placed for DVT prophylaxis. The patient was placed in the dorsal lithotomy position, prepped and draped in the usual sterile fashion, and preoperative antibiotics(Ancef and fluconazole) were administered. A preoperative time-out was performed.   A 21 French rigid cystoscope was used to intubate the urethra and thorough cystoscopy was performed.  The bladder was grossly normal with no suspicious lesions.  The left ureteral stent was grasped and pulled to the meatus, and a sensor wire advanced  through the stent up to the left kidney under fluoroscopic vision.  A semirigid short ureteroscope was advanced alongside the wire and there was a black 1 cm stone in the distal ureter.  A 200 m laser fiber with settings of 1.0 J and 10 Hz was used to methodically fragment the stone.  A basket was then used to remove all significant fragments.  Thorough ureteroscopy revealed no ureteral injury or residual fragments.    A retrograde pyelogram was performed to the proximal ureter and showed no extravasation or filling defects.  The bladder was drained with the sheath, and a 6 [place] by 24cm stent was placed fluoroscopically with an excellent curl in the kidney as well as in the bladder.  The Dangler was secured to the suprapubic region using Mastisol and Tegaderm.  Disposition: Stable to PACU  Plan: Stent can be removed at her facility on Tuesday 6/13(will call next week to confirm removal) Keflex prophylaxis with stent in place Can follow-up with urology as needed  Legrand Rams, MD

## 2021-06-24 NOTE — Anesthesia Postprocedure Evaluation (Signed)
Anesthesia Post Note  Patient: Lori Petersen  Procedure(s) Performed: CYSTOSCOPY/URETEROSCOPY/HOLMIUM LASER/STENT PLACEMENT (Left: Ureter)  Patient location during evaluation: PACU Anesthesia Type: General Level of consciousness: awake and alert Pain management: pain level controlled Vital Signs Assessment: post-procedure vital signs reviewed and stable Respiratory status: spontaneous breathing, nonlabored ventilation, respiratory function stable and patient connected to nasal cannula oxygen Cardiovascular status: blood pressure returned to baseline and stable Postop Assessment: no apparent nausea or vomiting Anesthetic complications: no   No notable events documented.   Last Vitals:  Vitals:   06/24/21 1230 06/24/21 1245  BP: (!) 134/55 (!) 140/100  Pulse: 84   Resp: 16 18  Temp:  36.8 C  SpO2: (!) 88%     Last Pain:  Vitals:   06/24/21 0905  TempSrc: Temporal                 Corinda Gubler

## 2021-06-24 NOTE — Transfer of Care (Signed)
Immediate Anesthesia Transfer of Care Note  Patient: Lori Petersen [facility]  Procedure(s) Performed: CYSTOSCOPY/URETEROSCOPY/HOLMIUM LASER/STENT PLACEMENT (Left: Ureter)  Patient Location: PACU  Anesthesia Type:General  Level of Consciousness: awake  Airway & Oxygen Therapy: Patient Spontanous Breathing and Patient connected to face mask oxygen  Post-op Assessment: Report given to RN and Post -op Vital signs reviewed and stable  Post vital signs: Reviewed and stable  Last Vitals:  Vitals Value Taken Time  BP 107/58 06/24/21 1156  Temp    Pulse 69 06/24/21 1200  Resp 24 06/24/21 1200  SpO2 100 % 06/24/21 1200  Vitals shown include unvalidated device data.  Last Pain:  Vitals:   06/24/21 0905  TempSrc: Temporal         Complications: No notable events documented.

## 2021-06-24 NOTE — Progress Notes (Signed)
PACU note:O2 sats ranging from 84-95% on O2 @2L  (pt stays on 2L at [facility])  HR 105-108 and been 60's. DBP 100.  Discussed w/Dr. . Orders received for labetolol 5mg .

## 2021-06-24 NOTE — H&P (Signed)
06/24/21 9:25 AM   Lori Petersen [facility] [birth year] [id number]  CC: Left ureteral stone  HPI: 66 year old female with developmental delay and dementia who originally presented in March 2023 with sepsis from urinary source and a 7 mm left distal ureteral stone with hydronephrosis.  She underwent urgent stent placement at that time.  Her follow-up definitive ureteroscopy has been delayed numerous times for challenges coordinating with her facility, eating the morning of surgery, and a hospitalization for pneumonia.   PMH: Past Medical History:  Diagnosis Date   AKI (acute kidney injury) ([place])    Arthritis    lower spine   Asthma    Dementia ([place])    Diastolic congestive heart failure ([place])    "only has problems with bad colds"   Down's syndrome    Full dentures    does not wear   GERD (gastroesophageal reflux disease)    barrett's   Glaucoma    History of kidney stones    HLD (hyperlipidemia)    Hypothyroidism    Onychomycosis    OSA (obstructive sleep apnea)    Pneumonia 05/17/2021   Pre-diabetes    Respiratory failure (HCC)    Seizures ([place])    Sepsis ([place]) 05/17/2021   Thrombocytopenia (H[facility])    UTI (urinary tract infection) 05/17/2021    Surgical History: Past Surgical History:  Procedure Laterality Date   BREAST BIOPSY Left 04/05/2016   neg. cylinder clip   BREAST BIOPSY Left 12/06/2016   ribbon clip. path pending   COLONOSCOPY WITH PROPOFOL N/A 04/24/2016   Procedure: COLONOSCOPY WITH PROPOFOL;  Surgeon: Lucilla Lame, MD;  Location: [place];  Service: Endoscopy;  Laterality: N/A;  Leave patient at 10:25 due to transportation   [place] Left 03/24/2021   Procedure: [place];  Surgeon: Billey Co, MD;  Location: [facility];  Service: Urology;  Laterality: Left;   ESOPHAGOGASTRODUODENOSCOPY (EGD) WITH PROPOFOL N/A 04/24/2016   Procedure: ESOPHAGOGASTRODUODENOSCOPY (EGD) WITH PROPOFOL;  Surgeon: Lucilla Lame, MD;   Location: [place];  Service: Endoscopy;  Laterality: N/A;   HERNIA REPAIR       Family History: History reviewed. No pertinent family history.  Social History:  reports that she has never smoked. She has never used smokeless tobacco. She reports that she does not currently use drugs. She reports that she does not drink alcohol.  Physical Exam: BP 104/79   Pulse 67   Temp (!) 97.3 F (36.3 C) (Temporal)   Resp 20   Ht 5\' 8"  (1.727 m)   Wt 88.9 kg   SpO2 96%   BMI 29.80 kg/m    Constitutional: Pleasantly confused, no acute distress Cardiovascular: Regular rate and rhythm Respiratory: Clear bilaterally GI: Abdomen is soft, nontender, nondistended, no abdominal masses   Laboratory Data: Urine culture 5/2 with 20 K yeast, was treated with fluconazole  Assessment & Plan:   66 year old female with developmental delay and dementia who presented in March 2023 with sepsis from urinary source and a 7 mm left distal ureteral stone and underwent urgent stent placement.  Her follow-up definitive ureteroscopy and laser lithotripsy has been delayed numerous times secondary to challenges communicating with her facility in the setting of her developmental delay/dementia, eating the morning of scheduled surgeries, as well as pneumonia.  We specifically discussed the risks ureteroscopy including bleeding, infection/sepsis, stent related symptoms including flank pain/urgency/frequency/incontinence/dysuria, ureteral injury, inability to access stone, or need for staged or additional procedures.  Cystoscopy, left ureteroscopy, laser  lithotripsy, stent change today  Nickolas Madrid, MD 06/24/2021  [facility] [address] [place], [place] [address] [phone]

## 2021-06-24 NOTE — Anesthesia Preprocedure Evaluation (Signed)
Anesthesia Evaluation  Patient identified by MRN, date of birth, ID band Patient awake    Reviewed: Allergy & Precautions, H&P , NPO status , Patient's Chart, lab work & pertinent test results, reviewed documented beta blocker date and time   History of Anesthesia Complications (+) history of anesthetic complications  Airway Mallampati: IV  TM Distance: >3 FB Neck ROM: full    Dental  (+) Edentulous Upper, Edentulous Lower   Pulmonary asthma , sleep apnea , neg pneumonia ,    Pulmonary exam normal breath sounds clear to auscultation       Cardiovascular Exercise Tolerance: Poor +CHF  Normal cardiovascular exam Rhythm:regular Rate:Normal  TTE 2023: 1. Left ventricular ejection fraction, by estimation, is 55 to 60%. The  left ventricle has normal function. The left ventricle has no regional  wall motion abnormalities. Left ventricular diastolic function could not  be evaluated.  2. Right ventricular systolic function was not well visualized. The right  ventricular size is not well visualized.  3. The mitral valve is grossly normal. No evidence of mitral valve  regurgitation.  4. The aortic valve is grossly normal. Aortic valve regurgitation is not  visualized.    Neuro/Psych Seizures -,  PSYCHIATRIC DISORDERS Dementia On antiepileptics    GI/Hepatic Neg liver ROS, GERD  Medicated,  Endo/Other  Hypothyroidism   Renal/GU Renal disease  negative genitourinary   Musculoskeletal   Abdominal   Peds  Hematology negative hematology ROS (+)   Anesthesia Other Findings Past Medical History: No date: Arthritis     Comment:  lower spine No date: Asthma No date: Dementia ([place]) No date: Diastolic congestive heart failure (HCC)     Comment:  "only has problems with bad colds" No date: Down's syndrome No date: Full dentures     Comment:  does not wear No date: GERD (gastroesophageal reflux disease) No date:  Hypothyroidism No date: Onychomycosis No date: Seizures [facility]) Past Surgical History: 04/05/2016: BREAST BIOPSY; Left     Comment:  neg. cylinder clip 12/06/2016: BREAST BIOPSY; Left     Comment:  ribbon clip. path pending 04/24/2016: COLONOSCOPY WITH PROPOFOL; N/A     Comment:  Procedure: COLONOSCOPY WITH PROPOFOL;  Surgeon: Lucilla Lame, MD;  Location: [place];  Service:               Endoscopy;  Laterality: N/A;  Leave patient at 10:25 due               to transportation 04/24/2016: ESOPHAGOGASTRODUODENOSCOPY (EGD) WITH PROPOFOL; N/A     Comment:  Procedure: ESOPHAGOGASTRODUODENOSCOPY (EGD) WITH               PROPOFOL;  Surgeon: Lucilla Lame, MD;  Location: [place];  Service: Endoscopy;  Laterality: N/A; No date: HERNIA REPAIR BMI    Body Mass Index: 34.18 kg/m     Reproductive/Obstetrics negative OB ROS                             Anesthesia Physical  Anesthesia Plan  ASA: 3  Anesthesia Plan: General   Post-op Pain Management: Ofirmev IV (intra-op)*   Induction: Intravenous  PONV Risk Score and Plan: 4 or greater and Ondansetron, Dexamethasone and Treatment may vary due to age or medical condition  Airway Management  Planned: Oral ETT and Video Laryngoscope Planned  Additional Equipment: None  Intra-op Plan:   Post-operative Plan: Extubation in OR  Informed Consent: I have reviewed the patients History and Physical, chart, labs and discussed the procedure including the risks, benefits and alternatives for the proposed anesthesia with the patient or authorized representative who has indicated his/her understanding and acceptance.     Dental Advisory Given  Plan Discussed with: CRNA  Anesthesia Plan Comments: (Patient has a legal guardian Jilda Roche, who provides consent. I made multiple attempts to reach her at the phone numbers listed in the chart with no success. It went to voicemail,  and the voicemail box was full so I could not leave any call back number or message. Surgical consent had already been obtained prior, and patient has had this procedure before. Will proceed with anesthetic.  Implied risks are PONV, sore throat, lip/dental/eye damage, cardiorespiratory and neurological sequelae, and allergic reactions. )        Anesthesia Quick Evaluation

## 2021-06-24 NOTE — Anesthesia Procedure Notes (Signed)
Procedure Name: Intubation Date/Time: 06/24/2021 10:55 AM  Performed by: Loletha Grayer, CRNAPre-anesthesia Checklist: Patient identified, Patient being monitored, Timeout performed, Emergency Drugs available and Suction available Patient Re-evaluated:Patient Re-evaluated prior to induction Oxygen Delivery Method: Circle system utilized Preoxygenation: Pre-oxygenation with 100% oxygen Induction Type: IV induction Ventilation: Mask ventilation without difficulty Laryngoscope Size: 3 and McGraph Grade View: Grade I Tube type: Oral Tube size: 6.5 mm Number of attempts: 1 Airway Equipment and Method: Stylet Placement Confirmation: ETT inserted through vocal cords under direct vision, positive ETCO2 and breath sounds checked- equal and bilateral Secured at: 20 cm Tube secured with: Tape Dental Injury: Teeth and Oropharynx as per pre-operative assessment

## 2021-06-24 NOTE — Discharge Instructions (Signed)

## 2021-06-25 ENCOUNTER — Inpatient Hospital Stay: Payer: Medicare (Managed Care)

## 2021-06-25 ENCOUNTER — Encounter: Payer: Self-pay | Admitting: Urology

## 2021-06-25 ENCOUNTER — Encounter
Admission: EM | Disposition: A | Discharge status: Skilled Nursing Facility | Payer: Self-pay | Source: Home / Self Care | Attending: Hospitalist

## 2021-06-25 ENCOUNTER — Emergency Department: Payer: Medicare (Managed Care)

## 2021-06-25 ENCOUNTER — Inpatient Hospital Stay: Payer: Medicare (Managed Care) | Admitting: Anesthesiology

## 2021-06-25 ENCOUNTER — Inpatient Hospital Stay
Admission: EM | Admit: 2021-06-25 | Discharge: 2021-06-30 | DRG: 853 | Disposition: A | Discharge status: Skilled Nursing Facility | Payer: Medicare (Managed Care) | Attending: Internal Medicine | Admitting: Internal Medicine

## 2021-06-25 DIAGNOSIS — Z79899 Other long term (current) drug therapy: Secondary | ICD-10-CM

## 2021-06-25 DIAGNOSIS — E870 Hyperosmolality and hypernatremia: Secondary | ICD-10-CM | POA: Diagnosis present

## 2021-06-25 DIAGNOSIS — Z20822 Contact with and (suspected) exposure to covid-19: Secondary | ICD-10-CM | POA: Diagnosis present

## 2021-06-25 DIAGNOSIS — E869 Volume depletion, unspecified: Secondary | ICD-10-CM | POA: Diagnosis not present

## 2021-06-25 DIAGNOSIS — D696 Thrombocytopenia, unspecified: Secondary | ICD-10-CM | POA: Diagnosis present

## 2021-06-25 DIAGNOSIS — F0150 Vascular dementia without behavioral disturbance: Secondary | ICD-10-CM | POA: Diagnosis present

## 2021-06-25 DIAGNOSIS — R6250 Unspecified lack of expected normal physiological development in childhood: Secondary | ICD-10-CM | POA: Diagnosis present

## 2021-06-25 DIAGNOSIS — Z515 Encounter for palliative care: Secondary | ICD-10-CM | POA: Diagnosis not present

## 2021-06-25 DIAGNOSIS — E785 Hyperlipidemia, unspecified: Secondary | ICD-10-CM | POA: Diagnosis present

## 2021-06-25 DIAGNOSIS — R6521 Severe sepsis with septic shock: Secondary | ICD-10-CM | POA: Diagnosis present

## 2021-06-25 DIAGNOSIS — Z6830 Body mass index (BMI) 30.0-30.9, adult: Secondary | ICD-10-CM

## 2021-06-25 DIAGNOSIS — J9601 Acute respiratory failure with hypoxia: Secondary | ICD-10-CM

## 2021-06-25 DIAGNOSIS — Z8616 Personal history of COVID-19: Secondary | ICD-10-CM

## 2021-06-25 DIAGNOSIS — I5032 Chronic diastolic (congestive) heart failure: Secondary | ICD-10-CM | POA: Diagnosis present

## 2021-06-25 DIAGNOSIS — E162 Hypoglycemia, unspecified: Secondary | ICD-10-CM | POA: Diagnosis not present

## 2021-06-25 DIAGNOSIS — F0280 Dementia in other diseases classified elsewhere without behavioral disturbance: Secondary | ICD-10-CM | POA: Diagnosis present

## 2021-06-25 DIAGNOSIS — N201 Calculus of ureter: Secondary | ICD-10-CM | POA: Diagnosis not present

## 2021-06-25 DIAGNOSIS — J9621 Acute and chronic respiratory failure with hypoxia: Secondary | ICD-10-CM | POA: Diagnosis not present

## 2021-06-25 DIAGNOSIS — E878 Other disorders of electrolyte and fluid balance, not elsewhere classified: Secondary | ICD-10-CM | POA: Diagnosis not present

## 2021-06-25 DIAGNOSIS — A4151 Sepsis due to Escherichia coli [E. coli]: Secondary | ICD-10-CM | POA: Diagnosis present

## 2021-06-25 DIAGNOSIS — N200 Calculus of kidney: Secondary | ICD-10-CM

## 2021-06-25 DIAGNOSIS — G4733 Obstructive sleep apnea (adult) (pediatric): Secondary | ICD-10-CM | POA: Diagnosis present

## 2021-06-25 DIAGNOSIS — J4520 Mild intermittent asthma, uncomplicated: Secondary | ICD-10-CM | POA: Diagnosis present

## 2021-06-25 DIAGNOSIS — K219 Gastro-esophageal reflux disease without esophagitis: Secondary | ICD-10-CM | POA: Diagnosis present

## 2021-06-25 DIAGNOSIS — Z66 Do not resuscitate: Secondary | ICD-10-CM | POA: Diagnosis present

## 2021-06-25 DIAGNOSIS — Z79890 Hormone replacement therapy (postmenopausal): Secondary | ICD-10-CM

## 2021-06-25 DIAGNOSIS — J9602 Acute respiratory failure with hypercapnia: Secondary | ICD-10-CM | POA: Diagnosis present

## 2021-06-25 DIAGNOSIS — N132 Hydronephrosis with renal and ureteral calculous obstruction: Secondary | ICD-10-CM

## 2021-06-25 DIAGNOSIS — D72829 Elevated white blood cell count, unspecified: Secondary | ICD-10-CM | POA: Diagnosis not present

## 2021-06-25 DIAGNOSIS — G928 Other toxic encephalopathy: Secondary | ICD-10-CM | POA: Diagnosis present

## 2021-06-25 DIAGNOSIS — Z7982 Long term (current) use of aspirin: Secondary | ICD-10-CM

## 2021-06-25 DIAGNOSIS — A419 Sepsis, unspecified organism: Secondary | ICD-10-CM | POA: Diagnosis not present

## 2021-06-25 DIAGNOSIS — Z7189 Other specified counseling: Secondary | ICD-10-CM | POA: Diagnosis not present

## 2021-06-25 DIAGNOSIS — Q909 Down syndrome, unspecified: Secondary | ICD-10-CM

## 2021-06-25 DIAGNOSIS — N179 Acute kidney failure, unspecified: Secondary | ICD-10-CM | POA: Diagnosis present

## 2021-06-25 DIAGNOSIS — Z7951 Long term (current) use of inhaled steroids: Secondary | ICD-10-CM

## 2021-06-25 DIAGNOSIS — G309 Alzheimer's disease, unspecified: Secondary | ICD-10-CM | POA: Diagnosis present

## 2021-06-25 DIAGNOSIS — G40909 Epilepsy, unspecified, not intractable, without status epilepticus: Secondary | ICD-10-CM | POA: Diagnosis present

## 2021-06-25 HISTORY — PX: CYSTOSCOPY WITH RETROGRADE PYELOGRAM, URETEROSCOPY AND STENT PLACEMENT: SHX5789

## 2021-06-25 LAB — MRSA NEXT GEN BY PCR, NASAL: MRSA by PCR Next Gen: NOT DETECTED

## 2021-06-25 LAB — BLOOD GAS, ARTERIAL
Acid-base deficit: 6.4 mmol/L — ABNORMAL HIGH (ref 0.0–2.0)
Bicarbonate: 19.1 mmol/L — ABNORMAL LOW (ref 20.0–28.0)
FIO2: 28 %
MECHVT: 510 mL
O2 Saturation: 98.8 %
PEEP: 5 cmH2O
Patient temperature: 37
RATE: 16 resp/min
pCO2 arterial: 37 mmHg (ref 32–48)
pH, Arterial: 7.32 — ABNORMAL LOW (ref 7.35–7.45)
pO2, Arterial: 88 mmHg (ref 83–108)

## 2021-06-25 LAB — URINALYSIS, COMPLETE (UACMP) WITH MICROSCOPIC
RBC / HPF: >50 RBC/hpf — ABNORMAL HIGH (ref 0–5)
Specific Gravity, Urine: 1.027 (ref 1.005–1.030)
Squamous Epithelial / HPF: NONE SEEN (ref 0–5)
WBC, UA: >50 WBC/hpf — ABNORMAL HIGH (ref 0–5)

## 2021-06-25 LAB — BASIC METABOLIC PANEL
Anion gap: 10 (ref 5–15)
Anion gap: 11 (ref 5–15)
BUN: 25 mg/dL — ABNORMAL HIGH (ref 8–23)
BUN: 29 mg/dL — ABNORMAL HIGH (ref 8–23)
CO2: 22 mmol/L (ref 22–32)
CO2: 18 mmol/L — ABNORMAL LOW (ref 22–32)
Calcium: 7.5 mg/dL — ABNORMAL LOW (ref 8.9–10.3)
Calcium: 7.3 mg/dL — ABNORMAL LOW (ref 8.9–10.3)
Chloride: 108 mmol/L (ref 98–111)
Chloride: 110 mmol/L (ref 98–111)
Creatinine, Ser: 3.05 mg/dL — ABNORMAL HIGH (ref 0.44–1.00)
Creatinine, Ser: 2.49 mg/dL — ABNORMAL HIGH (ref 0.44–1.00)
GFR, Estimated: 16 mL/min — ABNORMAL LOW (ref >60)
GFR, Estimated: 21 mL/min — ABNORMAL LOW (ref >60)
Glucose, Bld: 125 mg/dL — ABNORMAL HIGH (ref 70–99)
Glucose, Bld: 205 mg/dL — ABNORMAL HIGH (ref 70–99)
Potassium: 5.5 mmol/L — ABNORMAL HIGH (ref 3.5–5.1)
Potassium: 5.3 mmol/L — ABNORMAL HIGH (ref 3.5–5.1)
Sodium: 140 mmol/L (ref 135–145)
Sodium: 139 mmol/L (ref 135–145)

## 2021-06-25 LAB — HEPATIC FUNCTION PANEL
ALT: 32 U/L (ref 0–44)
AST: 84 U/L — ABNORMAL HIGH (ref 15–41)
Albumin: 2.5 g/dL — ABNORMAL LOW (ref 3.5–5.0)
Alkaline Phosphatase: 52 U/L (ref 38–126)
Bilirubin, Direct: 0.1 mg/dL (ref 0.0–0.2)
Indirect Bilirubin: 0.4 mg/dL (ref 0.3–0.9)
Total Bilirubin: 0.5 mg/dL (ref 0.3–1.2)
Total Protein: 5.9 g/dL — ABNORMAL LOW (ref 6.5–8.1)

## 2021-06-25 LAB — PROCALCITONIN: Procalcitonin: >150 ng/mL

## 2021-06-25 LAB — CBC WITH DIFFERENTIAL/PLATELET
Abs Immature Granulocytes: 3.54 10*3/uL — ABNORMAL HIGH (ref 0.00–0.07)
Basophils Absolute: 0 10*3/uL (ref 0.0–0.1)
Basophils Relative: 0 %
Eosinophils Absolute: 0.1 10*3/uL (ref 0.0–0.5)
Eosinophils Relative: 0 %
HCT: 33.5 % — ABNORMAL LOW (ref 36.0–46.0)
Hemoglobin: 9.8 g/dL — ABNORMAL LOW (ref 12.0–15.0)
Immature Granulocytes: 7 %
Lymphocytes Relative: 2 %
Lymphs Abs: 1.1 10*3/uL (ref 0.7–4.0)
MCH: 30.9 pg (ref 26.0–34.0)
MCHC: 29.3 g/dL — ABNORMAL LOW (ref 30.0–36.0)
MCV: 105.7 fL — ABNORMAL HIGH (ref 80.0–100.0)
Monocytes Absolute: 1.1 10*3/uL — ABNORMAL HIGH (ref 0.1–1.0)
Monocytes Relative: 2 %
Neutro Abs: 46.3 10*3/uL — ABNORMAL HIGH (ref 1.7–7.7)
Neutrophils Relative %: 89 %
Platelets: 91 10*3/uL — ABNORMAL LOW (ref 150–400)
RBC: 3.17 MIL/uL — ABNORMAL LOW (ref 3.87–5.11)
RDW: 18.4 % — ABNORMAL HIGH (ref 11.5–15.5)
Smear Review: DECREASED
WBC: 52.2 10*3/uL (ref 4.0–10.5)
nRBC: 0 % (ref 0.0–0.2)

## 2021-06-25 LAB — PROTIME-INR
INR: 1.4 — ABNORMAL HIGH (ref 0.8–1.2)
Prothrombin Time: 16.9 seconds — ABNORMAL HIGH (ref 11.4–15.2)

## 2021-06-25 LAB — APTT: aPTT: 47 seconds — ABNORMAL HIGH (ref 24–36)

## 2021-06-25 LAB — RESP PANEL BY RT-PCR (FLU A&B, COVID) ARPGX2
Influenza A by PCR: NEGATIVE
Influenza B by PCR: NEGATIVE
SARS Coronavirus 2 by RT PCR: NEGATIVE

## 2021-06-25 LAB — TROPONIN I (HIGH SENSITIVITY)
Troponin I (High Sensitivity): 129 ng/L (ref <18)
Troponin I (High Sensitivity): 111 ng/L (ref <18)

## 2021-06-25 LAB — LACTIC ACID, PLASMA
Lactic Acid, Venous: 5.1 mmol/L (ref 0.5–1.9)
Lactic Acid, Venous: 5 mmol/L (ref 0.5–1.9)
Lactic Acid, Venous: 4 mmol/L (ref 0.5–1.9)

## 2021-06-25 LAB — GLUCOSE, CAPILLARY
Glucose-Capillary: 149 mg/dL — ABNORMAL HIGH (ref 70–99)
Glucose-Capillary: 201 mg/dL — ABNORMAL HIGH (ref 70–99)

## 2021-06-25 LAB — MAGNESIUM: Magnesium: 1.8 mg/dL (ref 1.7–2.4)

## 2021-06-25 LAB — PHOSPHORUS: Phosphorus: 4.5 mg/dL (ref 2.5–4.6)

## 2021-06-25 LAB — BRAIN NATRIURETIC PEPTIDE: B Natriuretic Peptide: 348.4 pg/mL — ABNORMAL HIGH (ref 0.0–100.0)

## 2021-06-25 SURGERY — CYSTOURETEROSCOPY, WITH RETROGRADE PYELOGRAM AND STENT INSERTION
Anesthesia: General | Site: Ureter | Laterality: Left

## 2021-06-25 MED ORDER — DOCUSATE SODIUM 100 MG PO CAPS: 100.0000 mg | ORAL_CAPSULE | Freq: Two times a day (BID) | ORAL | Status: DC | PRN

## 2021-06-25 MED ORDER — SODIUM BICARBONATE 8.4 % IV SOLN
50.0000 meq | Freq: Once | INTRAVENOUS | Status: AC
  Administered 2021-06-25: 50 meq via INTRAVENOUS
  Filled 2021-06-25: qty 50

## 2021-06-25 MED ORDER — VANCOMYCIN VARIABLE DOSE PER UNSTABLE RENAL FUNCTION (PHARMACIST DOSING): Status: DC

## 2021-06-25 MED ORDER — IPRATROPIUM-ALBUTEROL 0.5-2.5 (3) MG/3ML IN SOLN: 3.0000 mL | Freq: Four times a day (QID) | RESPIRATORY_TRACT | Status: DC | PRN

## 2021-06-25 MED ORDER — NOREPINEPHRINE 4 MG/250ML-% IV SOLN
2.0000 ug/min | INTRAVENOUS | Status: DC
  Administered 2021-06-25: 2 ug/min via INTRAVENOUS
  Administered 2021-06-25: 7 ug/min via INTRAVENOUS
  Filled 2021-06-25 (×2): qty 250

## 2021-06-25 MED ORDER — DOCUSATE SODIUM 50 MG/5ML PO LIQD
100.0000 mg | Freq: Two times a day (BID) | ORAL | Status: DC
  Administered 2021-06-25 – 2021-06-26 (×2): 100 mg
  Filled 2021-06-25 (×2): qty 10

## 2021-06-25 MED ORDER — CHLORHEXIDINE GLUCONATE CLOTH 2 % EX PADS
6.0000 | MEDICATED_PAD | Freq: Every day | CUTANEOUS | Status: DC
Start: 2021-06-26 — End: 2021-06-28

## 2021-06-25 MED ORDER — SODIUM CHLORIDE 0.9 % IR SOLN
Status: DC | PRN
  Administered 2021-06-25: 500 mL

## 2021-06-25 MED ORDER — ORAL CARE MOUTH RINSE
15.0000 mL | OROMUCOSAL | Status: DC
  Administered 2021-06-26 (×6): 15 mL via OROMUCOSAL

## 2021-06-25 MED ORDER — INSULIN ASPART 100 UNIT/ML IJ SOLN
0.0000 [IU] | INTRAMUSCULAR | Status: DC
  Administered 2021-06-25 – 2021-06-26 (×2): 3 [IU] via SUBCUTANEOUS
  Filled 2021-06-25: qty 1

## 2021-06-25 MED ORDER — IPRATROPIUM-ALBUTEROL 0.5-2.5 (3) MG/3ML IN SOLN
3.0000 mL | Freq: Four times a day (QID) | RESPIRATORY_TRACT | Status: DC
  Administered 2021-06-25 – 2021-06-27 (×10): 3 mL via RESPIRATORY_TRACT
  Filled 2021-06-25 (×10): qty 3

## 2021-06-25 MED ORDER — POLYETHYLENE GLYCOL 3350 17 G PO PACK
17.0000 g | PACK | Freq: Every day | ORAL | Status: DC
  Administered 2021-06-25 – 2021-06-26 (×2): 17 g
  Filled 2021-06-25 (×2): qty 1

## 2021-06-25 MED ORDER — SODIUM CHLORIDE 0.9 % IV SOLN
2.0000 g | INTRAVENOUS | Status: DC
  Filled 2021-06-25: qty 12.5

## 2021-06-25 MED ORDER — PANTOPRAZOLE SODIUM 40 MG IV SOLR: 40.0000 mg | Freq: Every day | INTRAVENOUS | Status: DC

## 2021-06-25 MED ORDER — LACTATED RINGERS IV BOLUS
500.0000 mL | Freq: Once | INTRAVENOUS | Status: AC
  Administered 2021-06-25: 500 mL via INTRAVENOUS

## 2021-06-25 MED ORDER — SODIUM CHLORIDE 0.9 % IV SOLN
250.0000 mL | INTRAVENOUS | Status: DC
  Administered 2021-06-30: 250 mL via INTRAVENOUS

## 2021-06-25 MED ORDER — PANTOPRAZOLE 2 MG/ML SUSPENSION
40.0000 mg | Freq: Every day | ORAL | Status: DC
  Administered 2021-06-25 – 2021-06-26 (×2): 40 mg
  Filled 2021-06-25 (×2): qty 20

## 2021-06-25 MED ORDER — PROPOFOL 500 MG/50ML IV EMUL
INTRAVENOUS | Status: DC | PRN
  Administered 2021-06-25: 50 ug/kg/min via INTRAVENOUS

## 2021-06-25 MED ORDER — PROPOFOL 1000 MG/100ML IV EMUL
INTRAVENOUS | Status: AC
  Filled 2021-06-25: qty 100

## 2021-06-25 MED ORDER — LACTATED RINGERS IV SOLN: INTRAVENOUS | Status: DC

## 2021-06-25 MED ORDER — SODIUM ZIRCONIUM CYCLOSILICATE 5 G PO PACK
10.0000 g | PACK | Freq: Once | ORAL | Status: AC
  Administered 2021-06-25: 10 g
  Filled 2021-06-25: qty 2

## 2021-06-25 MED ORDER — ORAL CARE MOUTH RINSE: 15.0000 mL | Freq: Two times a day (BID) | OROMUCOSAL | Status: DC

## 2021-06-25 MED ORDER — SODIUM CHLORIDE 0.9 % IV BOLUS
1000.0000 mL | Freq: Once | INTRAVENOUS | Status: AC
  Administered 2021-06-25: 1000 mL via INTRAVENOUS

## 2021-06-25 MED ORDER — SODIUM CHLORIDE 0.9 % IV BOLUS: 700.0000 mL | Freq: Once | INTRAVENOUS | Status: DC

## 2021-06-25 MED ORDER — LACTATED RINGERS IV SOLN: INTRAVENOUS | Status: DC | PRN

## 2021-06-25 MED ORDER — VANCOMYCIN HCL 1500 MG/300ML IV SOLN
1500.0000 mg | Freq: Once | INTRAVENOUS | Status: AC
  Administered 2021-06-25: 1500 mg via INTRAVENOUS
  Filled 2021-06-25: qty 300

## 2021-06-25 MED ORDER — VANCOMYCIN HCL IN DEXTROSE 1-5 GM/200ML-% IV SOLN
1000.0000 mg | Freq: Once | INTRAVENOUS | Status: DC
  Filled 2021-06-25: qty 200

## 2021-06-25 MED ORDER — CHLORHEXIDINE GLUCONATE 0.12 % MT SOLN
15.0000 mL | Freq: Two times a day (BID) | OROMUCOSAL | Status: DC
  Administered 2021-06-25 – 2021-06-30 (×9): 15 mL via OROMUCOSAL
  Filled 2021-06-25 (×8): qty 15

## 2021-06-25 MED ORDER — HEPARIN SODIUM (PORCINE) 5000 UNIT/ML IJ SOLN
5000.0000 [IU] | Freq: Three times a day (TID) | INTRAMUSCULAR | Status: DC
  Administered 2021-06-25 – 2021-06-27 (×5): 5000 [IU] via SUBCUTANEOUS
  Filled 2021-06-25 (×5): qty 1

## 2021-06-25 MED ORDER — PROPOFOL 10 MG/ML IV BOLUS
INTRAVENOUS | Status: DC | PRN
  Administered 2021-06-25: 50 mg via INTRAVENOUS

## 2021-06-25 MED ORDER — ONDANSETRON HCL 4 MG/2ML IJ SOLN
INTRAMUSCULAR | Status: DC | PRN
  Administered 2021-06-25: 4 mg via INTRAVENOUS

## 2021-06-25 MED ORDER — PHENYLEPHRINE HCL (PRESSORS) 10 MG/ML IV SOLN
INTRAVENOUS | Status: DC | PRN
  Administered 2021-06-25: 240 ug via INTRAVENOUS
  Administered 2021-06-25: 80 ug via INTRAVENOUS
  Administered 2021-06-25 (×3): 160 ug via INTRAVENOUS

## 2021-06-25 MED ORDER — KETAMINE HCL 50 MG/5ML IJ SOSY
PREFILLED_SYRINGE | INTRAMUSCULAR | Status: AC
  Filled 2021-06-25: qty 5

## 2021-06-25 MED ORDER — KETAMINE HCL 10 MG/ML IJ SOLN
INTRAMUSCULAR | Status: DC | PRN
  Administered 2021-06-25: 50 mg via INTRAVENOUS

## 2021-06-25 MED ORDER — ROCURONIUM BROMIDE 100 MG/10ML IV SOLN
INTRAVENOUS | Status: DC | PRN
  Administered 2021-06-25: 100 mg via INTRAVENOUS

## 2021-06-25 MED ORDER — ONDANSETRON HCL 4 MG/2ML IJ SOLN
INTRAMUSCULAR | Status: AC
  Filled 2021-06-25: qty 2

## 2021-06-25 MED ORDER — METRONIDAZOLE 500 MG/100ML IV SOLN
500.0000 mg | Freq: Once | INTRAVENOUS | Status: AC
Start: 2021-06-25 — End: 2021-06-25
  Administered 2021-06-25: 500 mg via INTRAVENOUS
  Filled 2021-06-25: qty 100

## 2021-06-25 MED ORDER — DEXAMETHASONE SODIUM PHOSPHATE 10 MG/ML IJ SOLN
INTRAMUSCULAR | Status: DC | PRN
  Administered 2021-06-25: 10 mg via INTRAVENOUS

## 2021-06-25 MED ORDER — SODIUM CHLORIDE 0.9 % IV SOLN
2.0000 g | Freq: Once | INTRAVENOUS | Status: AC
  Administered 2021-06-25: 2 g via INTRAVENOUS
  Filled 2021-06-25: qty 12.5

## 2021-06-25 MED ORDER — PROPOFOL 10 MG/ML IV BOLUS
INTRAVENOUS | Status: AC
  Filled 2021-06-25: qty 20

## 2021-06-25 MED ORDER — POLYETHYLENE GLYCOL 3350 17 G PO PACK: 17.0000 g | PACK | Freq: Every day | ORAL | Status: DC | PRN

## 2021-06-25 SURGICAL SUPPLY — 30 items
BAG DRN LRG ANRFLXTWST DRN VL (MISCELLANEOUS) ×1
BAG DRN RND TRDRP ANRFLXCHMBR (UROLOGICAL SUPPLIES) ×1
BAG URINE DRAIN 2000ML AR STRL (UROLOGICAL SUPPLIES) ×1 IMPLANT
BAG URINE LEG 25OZ (MISCELLANEOUS) ×1 IMPLANT
BRUSH SCRUB EZ 1% IODOPHOR (MISCELLANEOUS) ×2 IMPLANT
CATH FOLEY 2WAY  5CC 16FR (CATHETERS) ×1
CATH FOLEY 2WAY 5CC 16FR (CATHETERS) ×1
CATH URETL OPEN 5X70 (CATHETERS) ×1 IMPLANT
CATH URTH 16FR FL 2W BLN LF (CATHETERS) IMPLANT
CNTNR SPEC 2.5X3XGRAD LEK (MISCELLANEOUS) ×1
CONT SPEC 4OZ STER OR WHT (MISCELLANEOUS) ×1
CONT SPEC 4OZ STRL OR WHT (MISCELLANEOUS) ×1
CONTAINER SPEC 2.5X3XGRAD LEK (MISCELLANEOUS) IMPLANT
GLOVE SURG UNDER POLY LF SZ7.5 (GLOVE) ×2 IMPLANT
GOWN STRL REUS W/ TWL LRG LVL3 (GOWN DISPOSABLE) ×1 IMPLANT
GOWN STRL REUS W/ TWL XL LVL3 (GOWN DISPOSABLE) ×1 IMPLANT
GOWN STRL REUS W/TWL LRG LVL3 (GOWN DISPOSABLE) ×2
GOWN STRL REUS W/TWL XL LVL3 (GOWN DISPOSABLE) ×2
GUIDEWIRE STR DUAL SENSOR (WIRE) ×2 IMPLANT
GUIDEWIRE STR ZIPWIRE 035X150 (MISCELLANEOUS) ×1 IMPLANT
IV NS IRRIG 3000ML ARTHROMATIC (IV SOLUTION) ×2 IMPLANT
KIT TURNOVER CYSTO (KITS) ×2 IMPLANT
PACK CYSTO AR (MISCELLANEOUS) ×2 IMPLANT
SET CYSTO W/LG BORE CLAMP LF (SET/KITS/TRAYS/PACK) ×2 IMPLANT
STENT URET 6FRX22 CONTOUR (STENTS) ×1 IMPLANT
STENT URET 6FRX24 CONTOUR (STENTS) IMPLANT
STENT URET 6FRX26 CONTOUR (STENTS) IMPLANT
SURGILUBE 2OZ TUBE FLIPTOP (MISCELLANEOUS) ×2 IMPLANT
SYR 10ML LL (SYRINGE) ×2 IMPLANT
WATER STERILE IRR 500ML POUR (IV SOLUTION) ×2 IMPLANT

## 2021-06-25 NOTE — Anesthesia Procedure Notes (Deleted)
Arterial Line Insertion Start/End6/10/2021 4:27 PM, 06/25/2021 4:30 PM Performed by: Foye Deer, MD, Katherine Basset, CRNA, CRNA  Patient location: Pre-op. Preanesthetic checklist: patient identified, IV checked, site marked, risks and benefits discussed, surgical consent, monitors and equipment checked, pre-op evaluation, timeout performed and anesthesia consent Emergency situation Lidocaine 1% used for infiltration Left, radial was placed Catheter size: 20 G Hand hygiene performed  and maximum sterile barriers used   Attempts: 1 Procedure performed using ultrasound guided technique. Ultrasound Notes:anatomy identified, needle tip was noted to be adjacent to the nerve/plexus identified and no ultrasound evidence of intravascular and/or intraneural injection Following insertion, dressing applied. Post procedure assessment: normal and unchanged

## 2021-06-25 NOTE — H&P (Addendum)
NAME:  Aviance [facility], MRN:  [id number], DOB:  1955-03-19, LOS: 0 ADMISSION DATE:  06/25/2021  History of Present Illness:  66 y.o. female with developmental delay, dementia, Down syndrome who comes in with concerns for hypotension and tachycardia.  being admitted for septic shock  She  was seen by urology Dr. Signa Kell.   Appears that they did a cystoscopy and laser lithotripsy and had a stent change.    On review of records in March 2023 she had sepsis and had stent placement at that time.  There was difficulty following up due to coordination and she was hospitalized for pneumonia after that.    Patient is followed by the [facility] program.  Is unclear how much oxygen patient is typically on at baseline sounds like maybe 2 L as needed.    Patient was on 2 L noted to have some slightly low oxygen levels so patient increased to 4 L with EMS.    There was concern for patient's low blood pressure therefore patient was transferred to the emergency room with sepsis alert.  ER COURSE CODE SEPSIS IV ABX GIVEN STARTED ON PRESSORS   Pertinent  Medical History   Active Ambulatory Problems    Diagnosis Date Noted   Onychomycosis    Bradycardia 05/14/2015   Mixed Alzheimer's and vascular dementia (HCC) 09/14/2014   Problems with swallowing and mastication    Blood in stool    Left tibial fracture 04/27/2017   Right tibial fracture 04/27/2017   Acute respiratory failure with hypoxia (HCC) 11/02/2018   COVID-19    COVID-19 virus infection 11/03/2018   Obesity, Class III, 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   Chronic diastolic CHF (congestive heart failure) (HCC) 11/06/2018   Acute metabolic encephalopathy 11/06/2018   Hypothyroid 11/06/2018   Seizures (HCC) 12/03/2018   AKI (acute kidney injury) (HCC) 03/24/2021   Septic shock (HCC)    Left ureteral stone    Asthma, mild intermittent 03/25/2021   Nausea & vomiting 03/25/2021   Bacteremia due to Escherichia coli 03/25/2021    Complicated UTI (urinary tract infection) 03/25/2021   Down syndrome 03/25/2021   Hypernatremia 03/26/2021   Thrombocytopenia (HCC) 03/26/2021   Pneumonia 05/17/2021   Dementia (HCC)    HLD (hyperlipidemia)    Sepsis (HCC)    Respiratory failure (HCC) 05/18/2021   Resolved Ambulatory Problems    Diagnosis Date Noted   Hypothyroidism    Past Medical History:  Diagnosis Date   Arthritis    Asthma    Diastolic congestive heart failure (HCC)    Down's syndrome    Full dentures    GERD (gastroesophageal reflux disease)    Glaucoma    History of kidney stones    OSA (obstructive sleep apnea)    Pre-diabetes    UTI (urinary tract infection) 05/17/2021     Significant Hospital Events: Including procedures, antibiotic start and stop dates in addition to other pertinent events   6/10 admitted to ICU for severe septic shock      Micro Data:  BLOOD CX pending Ucx Pending INFL/COVID NEG  Antimicrobials:   Antibiotics Given (last 72 hours)     Date/Time Action Medication Dose Rate   06/25/21 1126 New Bag/Given   ceFEPIme (MAXIPIME) 2 g in sodium chloride 0.9 % 100 mL IVPB 2 g 200 mL/hr   06/25/21 1147 New Bag/Given   metroNIDAZOLE (FLAGYL) IVPB 500 mg 500 mg 100 mL/hr   06/25/21 1244 New Bag/Given   vancomycin (VANCOREADY) IVPB 1500  mg/300 mL 1,500 mg 150 mL/hr            Interim History / Subjective:  Patient is lethargic increased WOB Severe LA, severe septic shock Started on pressors     Objective   Blood pressure 101/81, pulse (!) 114, temperature 99.3 F (37.4 C), temperature source Rectal, resp. rate (!) 24, height 5\' 8"  (1.727 m), weight 88.9 kg, SpO2 96 %.        Intake/Output Summary (Last 24 hours) at 06/25/2021 1357 Last data filed at 06/25/2021 1300 Gross per 24 hour  Intake 1130.03 ml  Output --  Net 1130.03 ml   Filed Weights   06/25/21 1110  Weight: 88.9 kg      REVIEW OF SYSTEMS  PATIENT IS UNABLE TO PROVIDE COMPLETE REVIEW  OF SYSTEMS DUE TO SEVERE CRITICAL ILLNESS AND TOXIC METABOLIC ENCEPHALOPATHY   PHYSICAL EXAMINATION:  GENERAL:critically ill appearing, +resp distress EYES: Pupils equal, round, reactive to light.  No scleral icterus.  MOUTH: Moist mucosal membrane. NECK: Supple.  PULMONARY: +rhonchi,  CARDIOVASCULAR: S1 and S2.  No murmurs  GASTROINTESTINAL: Soft, nontender, -distended. Positive bowel sounds.  MUSCULOSKELETAL: No swelling, clubbing, or edema.  NEUROLOGIC: obtunded SKIN:intact,warm,dry     Labs/imaging that I havepersonally reviewed  (right click and "Reselect all SmartList Selections" daily)       ASSESSMENT AND PLAN SYNOPSIS  66 yo morbidly obese AAF with severe septic shock POA due to obstructed kidney stone with UTI with severe acidosis and acute renal failure with toxic metabolic encephalopathy  Severe ACUTE Hypoxic and Hypercapnic Respiratory Failure High risk for intubation and cardiac arrest  CARDIAC ICU monitoring   ACUTE KIDNEY INJURY/Renal Failure -continue Foley Catheter-assess need -Avoid nephrotoxic agents -Follow urine output, BMP -Ensure adequate renal perfusion, optimize oxygenation -Renal dose medications   Intake/Output Summary (Last 24 hours) at 06/25/2021 1357 Last data filed at 06/25/2021 1300 Gross per 24 hour  Intake 1130.03 ml  Output --  Net 1130.03 ml     NEUROLOGY Acute toxic metabolic encephalopathy  SEPTIC SHOCK SOURCE-UTI kidney stone -use vasopressors to keep MAP>65 as needed -follow ABG and LA -follow up cultures -emperic ABX -consider stress dose steroids -aggressive IV fluid resuscitation  INFECTIOUS DISEASE -continue antibiotics as prescribed -follow up cultures  ENDO - ICU hypoglycemic\Hyperglycemia protocol -check FSBS per protocol  OBSTRUCTED KIDNEY STONE FOLLOW UP UROLOGY RECS  GI GI PROPHYLAXIS as indicated  NUTRITIONAL STATUS DIET-->NPO Constipation protocol as  indicated   ELECTROLYTES -follow labs as needed -replace as needed -pharmacy consultation and following   ACUTE ANEMIA- TRANSFUSE AS NEEDED CONSIDER TRANSFUSION  IF HGB<7 DVT PRX with TED/SCD's ONLY     Best practice (right click and "Reselect all SmartList Selections" daily)  Diet: NPO Code Status:  FULL Disposition:ICU  Labs   CBC: Recent Labs  Lab 06/25/21 1119  WBC 52.2*  NEUTROABS 46.3*  HGB 9.8*  HCT 33.5*  MCV 105.7*  PLT 91*    Basic Metabolic Panel: Recent Labs  Lab 06/25/21 1119  NA 140  K 5.5*  CL 108  CO2 22  GLUCOSE 125*  BUN 25*  CREATININE 3.05*  CALCIUM 7.5*   GFR: Estimated Creatinine Clearance: 21.5 mL/min (A) (by C-G formula based on SCr of 3.05 mg/dL (H)). Recent Labs  Lab 06/25/21 1119  WBC 52.2*  LATICACIDVEN 5.1*    Liver Function Tests: Recent Labs  Lab 06/25/21 1119  AST 84*  ALT 32  ALKPHOS 52  BILITOT 0.5  PROT 5.9*  ALBUMIN 2.5*  No results for input(s): "LIPASE", "AMYLASE" in the last 168 hours. No results for input(s): "AMMONIA" in the last 168 hours.  ABG    Component Value Date/Time   PHART 7.36 05/17/2021 1605   PCO2ART 44 05/17/2021 1605   PO2ART 122 (H) 05/17/2021 1605   HCO3 24.9 05/17/2021 1605   ACIDBASEDEF 0.7 05/17/2021 1605   O2SAT 99.7 05/17/2021 1605     Coagulation Profile: Recent Labs  Lab 06/25/21 1119  INR 1.4*    Cardiac Enzymes: No results for input(s): "CKTOTAL", "CKMB", "CKMBINDEX", "TROPONINI" in the last 168 hours.  HbA1C: Hgb A1c MFr Bld  Date/Time Value Ref Range Status  05/17/2021 01:08 PM 5.9 (H) 4.8 - 5.6 % Final    Comment:    (NOTE) Pre diabetes:          5.7%-6.4%  Diabetes:              >6.4%  Glycemic control for   <7.0% adults with diabetes     CBG: Recent Labs  Lab 06/24/21 0919  GLUCAP 98     Past Medical History:  She,  has a past medical history of AKI (acute kidney injury) (HCC), Arthritis, Asthma, Dementia (HCC), Diastolic  congestive heart failure (HCC), Down's syndrome, Full dentures, GERD (gastroesophageal reflux disease), Glaucoma, History of kidney stones, HLD (hyperlipidemia), Hypothyroidism, Onychomycosis, OSA (obstructive sleep apnea), Pneumonia (05/17/2021), Pre-diabetes, Respiratory failure (HCC), Seizures (HCC), Sepsis (HCC) (05/17/2021), Thrombocytopenia (HCC), and UTI (urinary tract infection) (05/17/2021).   Surgical History:   Past Surgical History:  Procedure Laterality Date   BREAST BIOPSY Left 04/05/2016   neg. cylinder clip   BREAST BIOPSY Left 12/06/2016   ribbon clip. path pending   COLONOSCOPY WITH PROPOFOL N/A 04/24/2016   Procedure: COLONOSCOPY WITH PROPOFOL;  Surgeon: Midge Minium, MD;  Location: [facility];  Service: Endoscopy;  Laterality: N/A;  Leave patient at 10:25 due to transportation   CYSTOSCOPY WITH STENT PLACEMENT Left 03/24/2021   Procedure: CYSTOSCOPY WITH STENT PLACEMENT;  Surgeon: Sondra Come, MD;  Location: [facility];  Service: Urology;  Laterality: Left;   CYSTOSCOPY/URETEROSCOPY/HOLMIUM LASER/STENT PLACEMENT Left 06/24/2021   Procedure: CYSTOSCOPY/URETEROSCOPY/HOLMIUM LASER/STENT PLACEMENT;  Surgeon: Sondra Come, MD;  Location: [facility];  Service: Urology;  Laterality: Left;   ESOPHAGOGASTRODUODENOSCOPY (EGD) WITH PROPOFOL N/A 04/24/2016   Procedure: ESOPHAGOGASTRODUODENOSCOPY (EGD) WITH PROPOFOL;  Surgeon: Midge Minium, MD;  Location: [facility];  Service: Endoscopy;  Laterality: N/A;   HERNIA REPAIR       Social History:   reports that she has never smoked. She has never used smokeless tobacco. She reports that she does not currently use drugs. She reports that she does not drink alcohol.   Family History:  Her family history is not on file.   Allergies No Known Allergies   Home Medications  Prior to Admission medications   Medication Sig Start Date End Date Taking? Authorizing Provider  albuterol (PROVENTIL) (2.5 MG/3ML) 0.083% nebulizer  solution Take 2.5 mg by nebulization every 4 (four) hours as needed for wheezing or shortness of breath.    [provider]  aspirin EC 81 MG tablet Take 81 mg by mouth daily.    [provider]  budesonide-formoterol (SYMBICORT) 80-4.5 MCG/ACT inhaler Inhale 2 puffs into the lungs 2 (two) times daily. With spacer device    [provider]  cephALEXin (KEFLEX) 500 MG capsule Take 1 capsule (500 mg total) by mouth daily. 06/24/21   Sondra Come, MD  cholecalciferol (VITAMIN D3) 25  MCG (1000 UT) tablet Take 2,000 Units by mouth daily.    [provider]  DESITIN 13 % CREA Apply 1 application. topically in the morning and at bedtime. 03/29/21   [provider]  empagliflozin (JARDIANCE) 10 MG TABS tablet Take 10 mg by mouth daily.    [provider]  latanoprost (XALATAN) 0.005 % ophthalmic solution Place 1 drop into the left eye at bedtime.    [provider]  levETIRAcetam (KEPPRA) 250 MG tablet Take 250 mg by mouth 2 (two) times daily. (Take with  tablet to equal  total)    [provider]  levETIRAcetam (KEPPRA) 500 MG tablet Take 500 mg by mouth 2 (two) times daily. (Take with  tablet to equal  total)    [provider]  levothyroxine (SYNTHROID) 100 MCG tablet Take 100 mcg by mouth daily before breakfast.    [provider]  midazolam (VERSED) 5 MG/5ML SOLN injection Inject 2 mg into the muscle See admin instructions. Per MAR: Inject 2ml intramuscularly single dose as needed for seizure lasting longer than 5 minutes.    [provider]  omeprazole (PRILOSEC) 20 MG capsule Take 20 mg by mouth daily.    [provider]  pravastatin (PRAVACHOL) 40 MG tablet Take 40 mg by mouth at bedtime.    [provider]  predniSONE (DELTASONE) 20 MG tablet Take prednisone 30 mg daily for 3 days then prednisone 20 mg daily for 3 days then prednisone 10 mg daily for 3 days and  discontinue. 05/30/21   Cipriano Bunker, MD  senna (SENOKOT) 8.6 MG TABS tablet Take 2 tablets by mouth in the morning and at bedtime.    [provider]  vitamin B-12 (CYANOCOBALAMIN) 1000 MCG tablet Take 1,000 mcg by mouth daily.    [provider]  Zinc Oxide 13 % CREA Apply 1 application. topically 3 (three) times daily as needed (skin protection). Apply to gluteal area 3X daily with toileting - to protect skin    [provider]       Critical Care Time devoted to patient care services described in this note is 65 minutes.   Critical care was necessary to treat /prevent imminent and life-threatening deterioration.   PATIENT WITH VERY POOR PROGNOSIS I ANTICIPATE PROLONGED ICU LOS  Patient is critically ill. Patient with Multiorgan failure and at high risk for cardiac arrest and death.    Lucie Leather, M.D.  [facility]  Medical Director [facility] Medical Director [facility] Cardio-P[facility] Department

## 2021-06-25 NOTE — Anesthesia Procedure Notes (Addendum)
Arterial Line Insertion Start/End6/10/2021 4:35 PM, 06/25/2021 4:40 PM Performed by: Iran Ouch, MD, Esaw Grandchild, CRNA, CRNA  Patient location: OR. Preanesthetic checklist: patient identified, IV checked, site marked, risks and benefits discussed, surgical consent, monitors and equipment checked, pre-op evaluation, timeout performed and anesthesia consent Left, radial was placed Catheter size: 20 G Hand hygiene performed  and Seldinger technique used  Attempts: 1 Procedure performed using ultrasound guided technique. Ultrasound Notes:anatomy identified, needle tip was noted to be adjacent to the nerve/plexus identified and no ultrasound evidence of intravascular and/or intraneural injection Following insertion, Biopatch and dressing applied. Post procedure assessment: normal  Patient tolerated the procedure well with no immediate complications.

## 2021-06-25 NOTE — Progress Notes (Signed)
Sacral Pressure Injury - PTA  - Turn patient Q 2 with offloading measures - protect with sacral pad dressing - WOC consult for any additional recommendations   Lori Petersen, AGACNP-BC Acute Care Nurse Practitioner [facility]   [phone] / [phone] Please see Amion for pager details.

## 2021-06-25 NOTE — ED Notes (Signed)
Pt was transported to CT with RN

## 2021-06-25 NOTE — Progress Notes (Signed)
Pharmacy Antibiotic Note  Lori Petersen [facility] is a 66 y.o. female with developmental delay, dementia, Down syndrome, recent  cystoscopy and laser lithotripsy and had a stent change admitted on 06/25/2021 with sepsis.  Pharmacy has been consulted for vancomycin and cefepime dosing. Renal function appears impaired based on previous reading  Plan:  1) start cefepime 2 grams IV every 24 hours  2) vancomycin: she received 1500 mg IV vancomycin in the ED  Vancomycin variable dose marked placed given impaired renal function Vancomycin random level in am Daily renal function assessment: repeat BMP in am   Height: 5\' 8"  (172.7 cm) Weight: 88.9 kg (195 lb 15.8 oz) IBW/kg (Calculated) : 63.9  Temp (24hrs), Avg:98.5 F (36.9 C), Min:97.6 F (36.4 C), Max:99.3 F (37.4 C)  Recent Labs  Lab 06/25/21 1119 06/25/21 1335  WBC 52.2*  --   CREATININE 3.05*  --   LATICACIDVEN 5.1* 5.0*    Estimated Creatinine Clearance: 21.5 mL/min (A) (by C-G formula based on SCr of 3.05 mg/dL (H)).    No Known Allergies  Antimicrobials this admission: 06/10 vancomycin >>  06/10 cefepime >>   Microbiology results: 06/10 BCx: pending 06/10 UCx: pending    Thank you for allowing pharmacy to be a part of this patient's care.  08/10 06/25/2021 2:15 PM

## 2021-06-25 NOTE — Anesthesia Postprocedure Evaluation (Signed)
Anesthesia Post Note  Patient: Lori Petersen  Procedure(s) Performed: CYSTOSCOPY WITH RETROGRADE PYELOGRAM, URETEROSCOPY AND STENT PLACEMENT (Left: Ureter)  Patient location during evaluation: ICU Anesthesia Type: General Level of consciousness: patient remains intubated per anesthesia plan and sedated Pain management: pain level controlled Vital Signs Assessment: post-procedure vital signs reviewed and stable Respiratory status: respiratory function stable, patient remains intubated per anesthesia plan and patient on ventilator - see flowsheet for VS Cardiovascular status: unstable Anesthetic complications: no Comments: Pt remains ventilated due to her poor mental status pre-op with pre-op respiratory distress. The ICU requested patient to remain intubated. Pt remains on a NE gtt due to sepsis.   No notable events documented.   Last Vitals:  Vitals:   06/25/21 1545 06/25/21 1728  BP: 96/64   Pulse: (!) 110 89  Resp: 18 10  Temp:  37 C  SpO2: 97% 100%    Last Pain:  Vitals:   06/25/21 1728  TempSrc: Temporal                 Iran Ouch

## 2021-06-25 NOTE — Addendum Note (Signed)
Addendum  created 06/25/21 1849 by Katherine Basset, CRNA   Child order released for a procedure order, Clinical Note Signed, Delete clinical note, Intraprocedure Blocks edited, Order Canceled from Note, SmartForm saved

## 2021-06-25 NOTE — Consult Note (Signed)
Urology Consult  Requesting physician: Artis Delay, MD  Reason for consultation: Obstructing left ureteral stone fragment with septic shock  Chief Complaint: N/A  History of Present Illness: Lori Petersen is a 66 y.o. with a history of sepsis and an obstructing left distal ureteral calculus who underwent urgent stent placement March 2023.  Definitive stone treatment with ureteroscopy delayed on multiple occasions due to coordination with SNF and pneumonia.  She underwent uncomplicated left ureteroscopy yesterday by Dr. Richardo Hanks with left ureteral stent placement on a dangler with recommendations of stent removal on 06/28/2021.  Inadvertent stent removal early this morning during a bowel movement and several hours later developed hypotension and tachypnea.  Transported to the ED by EMS where her initial BP was 70/46.  Leukocytosis at 52.2 K and lactate 5.1.  Noncontrast CT chest/abdomen/pelvis remarkable for moderate hydronephrosis/hydroureter with a 3 mm fragment in the distal ureter and hazy calcifications in the left renal pelvis. She was started on pressors and transported to the ICU.  Past Medical History:  Diagnosis Date   AKI (acute kidney injury) (HCC)    Arthritis    lower spine   Asthma    Dementia (HCC)    Diastolic congestive heart failure (HCC)    "only has problems with bad colds"   Down's syndrome    Full dentures    does not wear   GERD (gastroesophageal reflux disease)    barrett's   Glaucoma    History of kidney stones    HLD (hyperlipidemia)    Hypothyroidism    Onychomycosis    OSA (obstructive sleep apnea)    Pneumonia 05/17/2021   Pre-diabetes    Respiratory failure (HCC)    Seizures (HCC)    Sepsis (HCC) 05/17/2021   Thrombocytopenia (HCC)    UTI (urinary tract infection) 05/17/2021    Past Surgical History:  Procedure Laterality Date   BREAST BIOPSY Left 04/05/2016   neg. cylinder clip   BREAST BIOPSY Left 12/06/2016   ribbon clip. path pending    COLONOSCOPY WITH PROPOFOL N/A 04/24/2016   Procedure: COLONOSCOPY WITH PROPOFOL;  Surgeon: Midge Minium, MD;  Location: [facility];  Service: Endoscopy;  Laterality: N/A;  Leave patient at 10:25 due to transportation   CYSTOSCOPY WITH STENT PLACEMENT Left 03/24/2021   Procedure: CYSTOSCOPY WITH STENT PLACEMENT;  Surgeon: Sondra Come, MD;  Location: [facility];  Service: Urology;  Laterality: Left;   CYSTOSCOPY/URETEROSCOPY/HOLMIUM LASER/STENT PLACEMENT Left 06/24/2021   Procedure: CYSTOSCOPY/URETEROSCOPY/HOLMIUM LASER/STENT PLACEMENT;  Surgeon: Sondra Come, MD;  Location: [facility];  Service: Urology;  Laterality: Left;   ESOPHAGOGASTRODUODENOSCOPY (EGD) WITH PROPOFOL N/A 04/24/2016   Procedure: ESOPHAGOGASTRODUODENOSCOPY (EGD) WITH PROPOFOL;  Surgeon: Midge Minium, MD;  Location: [facility];  Service: Endoscopy;  Laterality: N/A;   HERNIA REPAIR      Home Medications:  No outpatient medications have been marked as taking for the 06/25/21 encounter [facility] Encounter).    Allergies: No Known Allergies  No family history on file.  Social History: No prior tobacco history on chart review  ROS: Not obtainable due to critical illness  Physical Exam:  Vital signs in last 24 hours: Temp:  [97.6 F (36.4 C)-99.3 F (37.4 C)] 97.6 F (36.4 C) (06/10 1419) Pulse Rate:  [109-123] 123 (06/10 1419) Resp:  [21-37] 22 (06/10 1419) BP: (65-101)/(44-81) 87/56 (06/10 1419) SpO2:  [92 %-99 %] 92 % (06/10 1419) Weight:  [88.9 kg-93.1 kg] 93.1 kg (06/10 1419) Constitutional: Critically ill-appearing HEENT: [place] AT, moist  mucus membranes.  Trachea midline, no masses Cardiovascular: Regular rate and rhythm, no clubbing, cyanosis, or edema. Respiratory: Tachypneic, + rhonchi GI: Abdomen is soft, nontender, nondistended, no abdominal masses GU: No CVA tenderness Skin: No rashes, bruises or suspicious lesions Lymph: No cervical or inguinal adenopathy Neurologic:  Obtunded   Laboratory Data:  Recent Labs    06/25/21 1119  WBC 52.2*  HGB 9.8*  HCT 33.5*   Recent Labs    06/25/21 1119  NA 140  K 5.5*  CL 108  CO2 22  GLUCOSE 125*  BUN 25*  CREATININE 3.05*  CALCIUM 7.5*   Recent Labs    06/25/21 1119  INR 1.4*   No results for input(s): "LABURIN" in the last 72 hours. Results for orders placed or performed during the hospital encounter of 06/25/21  Resp Panel by RT-PCR (Flu A&B, Covid) Anterior Nasal Swab     Status: None   Collection Time: 06/25/21 11:19 AM   Specimen: Anterior Nasal Swab  Result Value Ref Range Status   SARS Coronavirus 2 by RT PCR NEGATIVE NEGATIVE Final    Comment: (NOTE) SARS-CoV-2 target nucleic acids are NOT DETECTED.  The SARS-CoV-2 RNA is generally detectable in upper respiratory specimens during the acute phase of infection. The lowest concentration of SARS-CoV-2 viral copies this assay can detect is 138 copies/mL. A negative result does not preclude SARS-Cov-2 infection and should not be used as the sole basis for treatment or other patient management decisions. A negative result may occur with  improper specimen collection/handling, submission of specimen other than nasopharyngeal swab, presence of viral mutation(s) within the areas targeted by this assay, and inadequate number of viral copies(<138 copies/mL). A negative result must be combined with clinical observations, patient history, and epidemiological information. The expected result is Negative.  Fact Sheet for Patients:  BloggerCourse.comhttps://www.fda.gov/media/152166/download  Fact Sheet for Healthcare Providers:  SeriousBroker.ithttps://www.fda.gov/media/152162/download  This test is no t yet approved or cleared by the [place] FDA and  has been authorized for detection and/or diagnosis of SARS-CoV-2 by FDA under an Emergency Use Authorization (EUA). This EUA will remain  in effect (meaning this test can be used) for the duration of the COVID-19  declaration under Section 564(b)(1) of the Act, 21 U.S.C.section 360bbb-3(b)(1), unless the authorization is terminated  or revoked sooner.       Influenza A by PCR NEGATIVE NEGATIVE Final   Influenza B by PCR NEGATIVE NEGATIVE Final    Comment: (NOTE) The Xpert Xpress SARS-CoV-2/FLU/RSV plus assay is intended as an aid in the diagnosis of influenza from Nasopharyngeal swab specimens and should not be used as a sole basis for treatment. Nasal washings and aspirates are unacceptable for Xpert Xpress SARS-CoV-2/FLU/RSV testing.  Fact Sheet for Patients: BloggerCourse.comhttps://www.fda.gov/media/152166/download  Fact Sheet for Healthcare Providers: SeriousBroker.ithttps://www.fda.gov/media/152162/download  This test is not yet approved or cleared by the [place] FDA and has been authorized for detection and/or diagnosis of SARS-CoV-2 by FDA under an Emergency Use Authorization (EUA). This EUA will remain in effect (meaning this test can be used) for the duration of the COVID-19 declaration under Section 564(b)(1) of the Act, 21 U.S.C. section 360bbb-3(b)(1), unless the authorization is terminated or revoked.  Performed at [facility], [address] [place], [place] [address]      Radiologic Imaging: Images of CT abdomen/pelvis were personally reviewed and interpreted  CT CHEST ABDOMEN PELVIS WO CONTRAST  Result Date: 06/25/2021 CLINICAL DATA:  Sepsis.  History of down syndrome. EXAM: CT CHEST, ABDOMEN AND  PELVIS WITHOUT CONTRAST TECHNIQUE: Multidetector CT imaging of the chest, abdomen and pelvis was performed following the standard protocol without IV contrast. RADIATION DOSE REDUCTION: This exam was performed according to the departmental dose-optimization program which includes automated exposure control, adjustment of the mA and/or kV according to patient size and/or use of iterative reconstruction technique. COMPARISON:  05/17/2021 FINDINGS: CT CHEST FINDINGS Cardiovascular: The heart  size is normal. No substantial pericardial effusion. Enlargement of the pulmonary outflow tract/main pulmonary arteries suggests pulmonary arterial hypertension. Mediastinum/Nodes: SIRT no mediastinal No evidence for gross hilar lymphadenopathy although assessment is limited by the lack of intravenous contrast on the current study. The esophagus has normal imaging features. There is no axillary lymphadenopathy. Lungs/Pleura: Interval improvement in aeration right middle lobe. Persistent atelectasis noted in both lower lobes. No substantial pleural effusion. Musculoskeletal: No worrisome lytic or sclerotic osseous abnormality. Degenerative changes noted in both shoulders CT ABDOMEN PELVIS FINDINGS Hepatobiliary: No suspicious focal abnormality in the liver on this study without intravenous contrast. There is no evidence for gallstones, gallbladder wall thickening, or pericholecystic fluid. No intrahepatic or extrahepatic biliary dilation. Pancreas: No focal mass lesion. No dilatation of the main duct. No intraparenchymal cyst. No peripancreatic edema. Spleen: No splenomegaly. No focal mass lesion. Adrenals/Urinary Tract: No adrenal nodule or mass. Left ureteral stent has been removed in the interval. 4 mm stone identified left renal pelvis. Urine in the left renal pelvis is higher in attenuation than expected and may be related to artifact from arm placement or some blood products in the collecting system. Left periureteric edema evident with mild left hydroureter. 3 mm stone is seen in the distal left ureter (108/2). Gas in the bladder lumen likely related to recent instrumentation. Stomach/Bowel: Stomach is distended with gas and fluid. Duodenum is normally positioned as is the ligament of Treitz. No small bowel wall thickening. No small bowel dilatation. No gross colonic mass. No colonic wall thickening. Vascular/Lymphatic: No abdominal aortic aneurysm. There is no gastrohepatic or hepatoduodenal ligament  lymphadenopathy. No retroperitoneal or mesenteric lymphadenopathy. No pelvic sidewall lymphadenopathy. Reproductive: The uterus is unremarkable.  There is no adnexal mass. Other: No intraperitoneal free fluid. Musculoskeletal: No worrisome lytic or sclerotic osseous abnormality. Bilateral pars interarticularis defects noted at L5. IMPRESSION: 1. 3 mm distal left ureteral stone with mild left hydroureteronephrosis. Urine in the left renal pelvis is higher in attenuation than expected and may be related to artifact from arm placement or some blood products in the collecting system. 2. 4 mm stone identified left renal pelvis. 3. Interval improvement in aeration right middle lobe with persistent atelectasis in both lower lobes. 4. Enlargement of the pulmonary outflow tract/main pulmonary arteries suggests pulmonary arterial hypertension. 5. Gas in the bladder lumen likely related to recent instrumentation. Electronically Signed   By: Kennith Center M.D.   On: 06/25/2021 13:23   CT HEAD WO CONTRAST ( )  Result Date: 06/25/2021 CLINICAL DATA:  Head trauma. Hypotension. Dementia and down syndrome. EXAM: CT HEAD WITHOUT CONTRAST TECHNIQUE: Contiguous axial images were obtained from the base of the skull through the vertex without intravenous contrast. RADIATION DOSE REDUCTION: This exam was performed according to the departmental dose-optimization program which includes automated exposure control, adjustment of the mA and/or kV according to patient size and/or use of iterative reconstruction technique. COMPARISON:  05/20/2021 FINDINGS: Brain: No evidence of acute infarction, hemorrhage, hydrocephalus, extra-axial collection or mass lesion/mass effect. Diffuse prominence of sulci and ventricles compatible with brain atrophy. There is mild diffuse low-attenuation within the subcortical  and periventricular white matter compatible with chronic microvascular disease. Chronic right basal ganglia lacunar infarcts noted.  Vascular: No hyperdense vessel or unexpected calcification. Skull: Normal. Negative for fracture or focal lesion. Sinuses/Orbits: No acute abnormality Other: None IMPRESSION: 1. No acute intracranial abnormalities. 2. Chronic small vessel ischemic disease and brain atrophy. Electronically Signed   By: Signa Kell M.D.   On: 06/25/2021 12:48   DG Chest Port 1 View  Result Date: 06/25/2021 CLINICAL DATA:  Pt arrives via EMS from [facility] with SOB, hypotension and fever. Pt had ureter stent placed yesterday. Pressure in 70s for EMS, given 750 cc NS. Started on levo en route. EXAM: PORTABLE CHEST 1 VIEW COMPARISON:  05/25/2021 and older studies. FINDINGS: Cardiac silhouette is mildly enlarged, stable. Stable prominent bilateral pulmonary arteries. No convincing mediastinal or hilar masses. Central vascular prominence. No evidence of pneumonia and no pulmonary edema. No convincing pleural effusion or pneumothorax. Bilateral glenohumeral joint arthropathic changes. IMPRESSION: 1. No acute cardiopulmonary disease. Electronically Signed   By: Amie Portland M.D.   On: 06/25/2021 11:28   DG OR UROLOGY CYSTO IMAGE ([facility] ONLY)  Result Date: 06/24/2021 There is no interpretation for this exam.  This order is for images obtained during a surgical procedure.  Please See "Surgeries" Tab for more information regarding the procedure.    Impression/Assessment:  66 y.o. female status post ureteroscopy 06/24/2021 with inadvertent stent removal and subsequent hypotension with leukocytosis and elevated lactic acid.  Most likely septic shock from a urinary source  Plan:  Recommend emergent cystoscopy with left ureteral stent placement.  I contacted her sister; Lori Petersen, who is her legal guardian and discussed the above findings and recommendation of stent placement.  The procedure was discussed including potential risks of worsening sepsis and rarely ureteral injury.  Possibility of percutaneous nephrostomy tube  placement was discussed if stent placement not successful.  All questions were answered and she was agreeable with proceeding with stent placement Plan discussed with Dr. Belia Heman    06/25/2021, 3:00 PM  Irineo Axon,  MD

## 2021-06-25 NOTE — Transfer of Care (Signed)
Immediate Anesthesia Transfer of Care Note  Patient: Lori Petersen  Procedure(s) Performed: CYSTOSCOPY WITH RETROGRADE PYELOGRAM, URETEROSCOPY AND STENT PLACEMENT (Left: Ureter)  Patient Location: ICU 16  Anesthesia Type:General  Level of Consciousness: SEDATED  Airway & Oxygen Therapy: Patient remains intubated per anesthesia plan and Patient placed on Ventilator (see vital sign flow sheet for setting)  Post-op Assessment: Report given to RN and Post -op Vital signs reviewed and stable  Post vital signs: Reviewed and stable  Last Vitals:  Vitals Value Taken Time  BP 116/61 06/25/21 1725  Temp 37 C 06/25/21 1728  Pulse 92 06/25/21 1729  Resp 16 06/25/21 1729  SpO2 96 % 06/25/21 1729  Vitals shown include unvalidated device data.  Last Pain:  Vitals:   06/25/21 1728  TempSrc: Temporal         Complications: No notable events documented.

## 2021-06-25 NOTE — Anesthesia Preprocedure Evaluation (Addendum)
Anesthesia Evaluation  Patient identified by MRN, date of birth, ID band Patient confused  General Assessment Comment:Withdrawals all extremities to pain. Opens eye to pain. Moans  Reviewed: Allergy & Precautions, H&P , NPO status , Patient's Chart, lab work & pertinent test results, reviewed documented beta blocker date and time   History of Anesthesia Complications (+) history of anesthetic complications  Airway       Comment: Unable to access due to patient status Dental  (+) Edentulous Upper, Edentulous Lower   Pulmonary asthma , sleep apnea , neg pneumonia ,  2 L noted to have some slightly low oxygen levels so patient increased to 4 L with EMS.   tachypnea   + wheezing      Cardiovascular Exercise Tolerance: Poor +CHF   Rate:Tachycardia  TTE 2023: 1. Left ventricular ejection fraction, by estimation, is 55 to 60%. The  left ventricle has normal function. The left ventricle has no regional  wall motion abnormalities. Left ventricular diastolic function could not  be evaluated.  2. Right ventricular systolic function was not well visualized. The right  ventricular size is not well visualized.  3. The mitral valve is grossly normal. No evidence of mitral valve  regurgitation.  4. The aortic valve is grossly normal. Aortic valve regurgitation is not  visualized.    NE gtt   Neuro/Psych Seizures -,  PSYCHIATRIC DISORDERS Dementia On antiepileptics    GI/Hepatic Neg liver ROS, GERD  Medicated,  Endo/Other  Hypothyroidism   Renal/GU Renal disease  negative genitourinary   Musculoskeletal   Abdominal (+) + obese,   Peds  Hematology negative hematology ROS (+)   Anesthesia Other Findings  66 y.o. female with developmental delay, dementia, Down syndrome, recent  cystoscopy and laser lithotripsy and had a stent change admitted on 06/25/2021 with sepsis  Past Medical History: No date: Arthritis     Comment:   lower spine No date: Asthma No date: Dementia ([place]) No date: Diastolic congestive heart failure (HCC)     Comment:  "only has problems with bad colds" No date: Down's syndrome No date: Full dentures     Comment:  does not wear No date: GERD (gastroesophageal reflux disease) No date: Hypothyroidism No date: Onychomycosis No date: Seizures [facility]) Past Surgical History: 04/05/2016: BREAST BIOPSY; Left     Comment:  neg. cylinder clip 12/06/2016: BREAST BIOPSY; Left     Comment:  ribbon clip. path pending 04/24/2016: COLONOSCOPY WITH PROPOFOL; N/A     Comment:  Procedure: COLONOSCOPY WITH PROPOFOL;  Surgeon: Lucilla Lame, MD;  Location: [place];  Service:               Endoscopy;  Laterality: N/A;  Leave patient at 10:25 due               to transportation 04/24/2016: ESOPHAGOGASTRODUODENOSCOPY (EGD) WITH PROPOFOL; N/A     Comment:  Procedure: ESOPHAGOGASTRODUODENOSCOPY (EGD) WITH               PROPOFOL;  Surgeon: Lucilla Lame, MD;  Location: [place];  Service: Endoscopy;  Laterality: N/A; No date: HERNIA REPAIR BMI    Body Mass Index: 34.18 kg/m     Reproductive/Obstetrics negative OB ROS  Anesthesia Physical  Anesthesia Plan  ASA: 4 and emergent  Anesthesia Plan: General   Post-op Pain Management:    Induction: Intravenous  PONV Risk Score and Plan: 4 or greater and Ondansetron, Dexamethasone and Treatment may vary due to age or medical condition  Airway Management Planned: Video Laryngoscope Planned  Additional Equipment: Arterial line  Intra-op Plan:   Post-operative Plan: Extubation in OR  Informed Consent: I have reviewed the patients History and Physical, chart, labs and discussed the procedure including the risks, benefits and alternatives for the proposed anesthesia with the patient or authorized representative who has indicated his/her understanding and  acceptance.     Dental Advisory Given and Consent reviewed with POA  Plan Discussed with: CRNA  Anesthesia Plan Comments:        Anesthesia Quick Evaluation

## 2021-06-25 NOTE — Sepsis Progress Note (Signed)
Notified provider of need to order additional fluid bolus in order to meet fluid requirements per protocol. Responded and waiting on CT results to make decision.

## 2021-06-25 NOTE — Anesthesia Procedure Notes (Signed)
Procedure Name: Intubation Date/Time: 06/25/2021 4:29 PM  Performed by: Hedda Slade, CRNAPre-anesthesia Checklist: Patient identified, Patient being monitored, Timeout performed, Emergency Drugs available and Suction available Patient Re-evaluated:Patient Re-evaluated prior to induction Oxygen Delivery Method: Circle system utilized Preoxygenation: Pre-oxygenation with 100% oxygen Induction Type: IV induction Ventilation: Mask ventilation without difficulty Laryngoscope Size: McGraph and 3 Grade View: Grade I Tube type: Oral Tube size: 7.5 mm Number of attempts: 1 Airway Equipment and Method: Stylet Placement Confirmation: ETT inserted through vocal cords under direct vision, positive ETCO2 and breath sounds checked- equal and bilateral Secured at: 22 cm Tube secured with: Tape Dental Injury: Teeth and Oropharynx as per pre-operative assessment

## 2021-06-25 NOTE — Op Note (Signed)
Preoperative diagnosis:  Obstructing left distal ureteral stone fragment Septic shock secondary to above  Postoperative diagnosis:  Same  Procedure:  Cystoscopy Left ureteral stent placement (55F/22 cm) Left retrograde pyelography with interpretation  Intraoperative fluoroscopy <30 minutes  Surgeon: Nicki Reaper C. Deontay Ladnier, M.D.  Anesthesia: General  Complications: None  Intraoperative findings:  Left retrograde pyelogram mild hydronephrosis/hydroureter. Urine left renal pelvis brownish, slightly turbid  EBL: Minimal  Specimens: Urine left renal pelvis for culture  Indication: Lori Petersen is a 66 y.o. female status post uncomplicated left ureteroscopy with stone removal 06/24/2021.  Stent left on a dangler with recommended removal 6/13 however inadvertent stent removal early this morning and subsequent development of hypotension and tachypnea.  CT with mild-moderate left hydronephrosis/hydroureter and a 3 mm fragment in the distal ureter.  Emergent stent placement recommended.  Informed consent has been obtained.  Description of procedure:  The patient was taken to the operating room and general anesthesia was induced.  The patient was placed in the dorsal lithotomy position, prepped and draped in the usual sterile fashion, and preoperative antibiotics were administered. A preoperative time-out was performed.   A 21 French cystoscope was lubricated and passed per urethra.  The bladder was emptied with brownish turbid urine was obtained.  A 0.038 Sensor wire was placed through the cystoscope and into the left ureteral orifice however resistance was met approximately 2 cm in and the wire was unable to be advanced.  A 5 French open-ended ureteral catheter was then placed through the cystoscope and a 0.038 Zip-wire was advanced through the catheter and into the left UO and was able to be negotiated passed the calculus.  The catheter was advanced over the guidewire and once in the region of  the renal pelvis the guidewire was removed and approximately 5 cc of urine was aspirated and sent for culture.  Retrograde pyelogram was then performed with findings as described above.  The Sensor wire was then placed through the ureteral catheter followed by catheter removal.  At this point the fluoroscopy stopped working and subsequent stent placement was performed using spot images.  A 55F/22 cm Contour ureteral stent was then placed over the guidewire.  A good curl was noted in the renal pelvis on x-ray and the distal end of the stent was well positioned in the bladder under direct vision.  The cystoscope was removed and a 35 French Foley catheter was placed to gravity drainage.  The patient was then transported back to the ICU.  Recommendation: Continue Foley catheter to maximize urinary drainage   [name], MD

## 2021-06-25 NOTE — ED Notes (Signed)
Informed RN bed assigned 

## 2021-06-25 NOTE — Progress Notes (Signed)
A consult was placed for an ultrasound guided piv for vasopressors;  attempted x 1 in RAFA with ultrasound; unable to thread catheter;  left arm also assessed but no suitable veins noted;  unable to compress;  pt has 2 ivs at present;  RNs aware of access status;  suggest central access for this pt.

## 2021-06-25 NOTE — ED Triage Notes (Signed)
Pt arrives via EMS from [facility] with SOB, hypotension and fever. Pt had ureter stent placed yesterday. Pressure in 70s for EMS, given 750 cc NS. Started on levo en route.

## 2021-06-25 NOTE — Progress Notes (Signed)
PHARMACY -  BRIEF ANTIBIOTIC NOTE   Pharmacy has received consult(s) for cefepime and vancomycin from an ED provider.  The patient's profile has been reviewed for ht/wt/allergies/indication/available labs.    One time order(s) placed for   1) cefepime 2 grams IV x 1  2) vancomycin 1500 mg IV x 1  Further antibiotics/pharmacy consults should be ordered by admitting physician if indicated.                       Thank you, Dallie Piles 06/25/2021  11:22 AM

## 2021-06-25 NOTE — Progress Notes (Addendum)
Received pt. From ED at approx. 1445, she was on vasopressors with a stabilized B/P. Made aware soon after than she was to return to OR for stent placement. Pt.returned from OR at approx. 1810 on ventilator and on vasopressor support with unstable B/P. Pt. Stabilized and comfortable soon after. Of note foley catheter placed during procedure with resulting bloody/clots urine.

## 2021-06-25 NOTE — Sepsis Progress Note (Signed)
Sepsis protocol is being followed by [facility]. 

## 2021-06-25 NOTE — ED Provider Notes (Addendum)
[facility] Provider Note    Event Date/Time   First MD Initiated Contact with Patient 06/25/21 1109     (approximate)   History   Hypotension   HPI  Lori Petersen is a 66 y.o. female with developmental delay, dementia, Down syndrome who comes in with concerns for hypotension and tachycardia.  I reviewed patient's admission summary from yesterday where she was seen by urology Dr. Signa Kell.  Appears that they did a cystoscopy and laser lithotripsy and had a stent change.  On review of records in March 2023 she had sepsis and had stent placement at that time.  There was difficulty following up due to coordination and she was hospitalized for pneumonia after that.  Patient is followed by the pace program.  Is unclear how much oxygen patient is typically on at baseline sounds like maybe 2 L as needed.  Patient was on 2 L noted to have some slightly low oxygen levels so patient increased to 4 L with EMS.  There was concern for patient's low blood pressure therefore patient was transferred to the emergency room with sepsis alert.   Physical Exam   Triage Vital Signs: ED Triage Vitals [06/25/21 1110]  Enc Vitals Group     BP (!) 70/46     Pulse      Resp (!) 32     Temp      Temp src      SpO2      Weight 195 lb 15.8 oz (88.9 kg)     Height 5\' 8"  (1.727 m)     Head Circumference      Peak Flow      Pain Score      Pain Loc      Pain Edu?      Excl. in GC?     Most recent vital signs: Vitals:   06/25/21 1110  BP: (!) 70/46  Resp: (!) 32     General: Awake, no distress.  Baseline MR CV:  Good peripheral perfusion.  Tachycardic Resp:  Normal effort.  Abd:  Seems slightly distended but hard to tell if she has any tenderness Other:  Contractures noted of her legs   ED Results / Procedures / Treatments   Labs (all labs ordered are listed, but only abnormal results are displayed) Labs Reviewed  RESP PANEL BY RT-PCR (FLU A&B, COVID) [id number]   CULTURE, BLOOD (ROUTINE X 2)  CULTURE, BLOOD (ROUTINE X 2)  URINE CULTURE  LACTIC ACID, PLASMA  LACTIC ACID, PLASMA  CBC WITH DIFFERENTIAL/PLATELET  PROTIME-INR  APTT  URINALYSIS, COMPLETE (UACMP) WITH MICROSCOPIC  BRAIN NATRIURETIC PEPTIDE  BASIC METABOLIC PANEL  HEPATIC FUNCTION PANEL  TROPONIN I (HIGH SENSITIVITY)     EKG  My interpretation of EKG:  Sinus tachycardia rate of 111 without any ST elevation or T wave inversion except for V2 V3, normal intervals  RADIOLOGY I have reviewed the xray personally and interpreted and no eivdence of pna or fluids   PROCEDURES:  Critical Care performed: Yes, see critical care procedure note(s)  .Critical Care  Performed by: 08/25/21, MD Authorized by: Concha Se, MD   Critical care provider statement:    Critical care time (minutes):  30   Critical care was necessary to treat or prevent imminent or life-threatening deterioration of the following conditions:  Sepsis   Critical care was time spent personally by me on the following activities:  Development of treatment plan with patient or  surrogate, discussions with consultants, evaluation of patient's response to treatment, examination of patient, ordering and review of laboratory studies, ordering and review of radiographic studies, ordering and performing treatments and interventions, pulse oximetry, re-evaluation of patient's condition and review of old charts .1-3 Lead EKG Interpretation  Performed by: Concha Se, MD Authorized by: Concha Se, MD     Interpretation: abnormal     ECG rate:  110   ECG rate assessment: tachycardic     Rhythm: sinus tachycardia     Ectopy: none     Conduction: normal   Ultrasound ED Peripheral IV (Provider)  Date/Time: 06/25/2021 12:15 PM  Performed by: Concha Se, MD Authorized by: Concha Se, MD   Procedure details:    Indications: hydration     Skin Prep: chlorhexidine gluconate     Location:  Left anterior  forearm   Angiocath:  20 G   Bedside Ultrasound Guided: Yes     Patient tolerated procedure without complications: Yes     Dressing applied: Yes      MEDICATIONS ORDERED IN ED: Medications  ceFEPIme (MAXIPIME) 2 g in sodium chloride 0.9 % 100 mL IVPB (has no administration in time range)  metroNIDAZOLE (FLAGYL) IVPB 500 mg (has no administration in time range)  vancomycin (VANCOCIN) IVPB 1000 mg/200 mL premix (has no administration in time range)  sodium chloride 0.9 % bolus 1,000 mL (has no administration in time range)  0.9 %  sodium chloride infusion (has no administration in time range)  norepinephrine (LEVOPHED) 4mg  in (0.016 mg/mL) premix infusion (has no administration in time range)     IMPRESSION / MDM / ASSESSMENT AND PLAN / ED COURSE  I reviewed the triage vital signs and the nursing notes.   Patient's presentation is most consistent with acute presentation with potential threat to life or bodily function.   This concerning for sepsis possibly from pneumonia, intra-abdominal infection, UTI.  Did discuss with the pace doctor who states that she typically presents with sepsis like this.  However given the tachycardia and hypertension also considered PE will get CT PE.  She does report that the patient typically had a stent put in after having the stone broken up yesterday but the stent did fall out when having a bowel movement.  Sepsis alert was called, broad-spectrum antibiotics and fluid was started.  We will hold off on full fluid resuscitation due to patient's history of requiring IV diuresis and getting a lot worse requiring BiPAP  Labs show significantly elevated CBC and broad-spectrum antibiotics have already been started.  Hemoglobin is stable.  Lactate is elevated to 5.  Patient troponin slightly elevated downtrending from prior so we will continue to trend out.  Potassium slightly elevated but I suspect that is from her AKI.  Patient getting fluids.  We will see  if patient needs a Foley based upon CT imaging.  IMPRESSION: 1. 3 mm distal left ureteral stone with mild left hydroureteronephrosis. Urine in the left renal pelvis is higher in attenuation than expected and may be related to artifact from arm placement or some blood products in the collecting system. 2. 4 mm stone identified left renal pelvis. 3. Interval improvement in aeration right middle lobe with persistent atelectasis in both lower lobes. 4. Enlargement of the pulmonary outflow tract/main pulmonary arteries suggests pulmonary arterial hypertension. 5. Gas in the bladder lumen likely related to recent instrumentation.    We will discuss with the ICU team- Discussed the case with  Dr. Belia HemanKasa.  CT imaging shows obstructive kidney stone on the right.  I initially held on additional fluid due to my concern for patient requiring BiPAP previously but given CT did not show any pulmonary edema I have ordered the rest of her full fluid resuscitation and given her the additional 700 cc of fluid.  I have discussed the case with Dr. Lonna CobbStoioff.  Urine is still pending but I have concern for obstructed infected kidney stone.  The patient is on the cardiac monitor to evaluate for evidence of arrhythmia and/or significant heart rate changes.  I have called patient's legal guardian Nicole CellaDorothy and updated her on patient going to the ICU for sepsis, kidney stone      FINAL CLINICAL IMPRESSION(S) / ED DIAGNOSES   Final diagnoses:  Sepsis, due to unspecified organism, unspecified whether acute organ dysfunction present (HCC)  AKI (acute kidney injury) (HCC)  Acute respiratory failure with hypoxia (HCC)  Kidney stone     Rx / DC Orders   ED Discharge Orders     None        Note:  This document was prepared using Dragon voice recognition software and may include unintentional dictation errors.   Concha SeFunke, Jurnee E, MD 06/25/21 1216    Concha SeFunke, Thelda E, MD 06/25/21 [phone]

## 2021-06-26 DIAGNOSIS — A419 Sepsis, unspecified organism: Secondary | ICD-10-CM | POA: Diagnosis not present

## 2021-06-26 DIAGNOSIS — R6521 Severe sepsis with septic shock: Secondary | ICD-10-CM | POA: Diagnosis not present

## 2021-06-26 LAB — PHOSPHORUS: Phosphorus: 3.2 mg/dL (ref 2.5–4.6)

## 2021-06-26 LAB — GLUCOSE, CAPILLARY
Glucose-Capillary: 238 mg/dL — ABNORMAL HIGH (ref 70–99)
Glucose-Capillary: 233 mg/dL — ABNORMAL HIGH (ref 70–99)
Glucose-Capillary: 159 mg/dL — ABNORMAL HIGH (ref 70–99)
Glucose-Capillary: 94 mg/dL (ref 70–99)
Glucose-Capillary: 106 mg/dL — ABNORMAL HIGH (ref 70–99)
Glucose-Capillary: 144 mg/dL — ABNORMAL HIGH (ref 70–99)

## 2021-06-26 LAB — BLOOD CULTURE ID PANEL (REFLEXED) - BCID2

## 2021-06-26 LAB — CBC
HCT: 28.6 % — ABNORMAL LOW (ref 36.0–46.0)
Hemoglobin: 9 g/dL — ABNORMAL LOW (ref 12.0–15.0)
MCH: 31.6 pg (ref 26.0–34.0)
MCHC: 31.5 g/dL (ref 30.0–36.0)
MCV: 100.4 fL — ABNORMAL HIGH (ref 80.0–100.0)
Platelets: 59 10*3/uL — ABNORMAL LOW (ref 150–400)
RBC: 2.85 MIL/uL — ABNORMAL LOW (ref 3.87–5.11)
RDW: 18.1 % — ABNORMAL HIGH (ref 11.5–15.5)
WBC: 50.3 10*3/uL (ref 4.0–10.5)
nRBC: 0 % (ref 0.0–0.2)

## 2021-06-26 LAB — VANCOMYCIN, RANDOM: Vancomycin Rm: 13 ug/mL

## 2021-06-26 LAB — MAGNESIUM: Magnesium: 1.9 mg/dL (ref 1.7–2.4)

## 2021-06-26 LAB — BASIC METABOLIC PANEL
Anion gap: 9 (ref 5–15)
BUN: 29 mg/dL — ABNORMAL HIGH (ref 8–23)
CO2: 21 mmol/L — ABNORMAL LOW (ref 22–32)
Calcium: 7.2 mg/dL — ABNORMAL LOW (ref 8.9–10.3)
Chloride: 111 mmol/L (ref 98–111)
Creatinine, Ser: 1.89 mg/dL — ABNORMAL HIGH (ref 0.44–1.00)
GFR, Estimated: 29 mL/min — ABNORMAL LOW (ref >60)
Glucose, Bld: 249 mg/dL — ABNORMAL HIGH (ref 70–99)
Potassium: 4.3 mmol/L (ref 3.5–5.1)
Sodium: 141 mmol/L (ref 135–145)

## 2021-06-26 LAB — LACTIC ACID, PLASMA
Lactic Acid, Venous: 2.1 mmol/L (ref 0.5–1.9)
Lactic Acid, Venous: 4.4 mmol/L (ref 0.5–1.9)
Lactic Acid, Venous: 5.8 mmol/L (ref 0.5–1.9)

## 2021-06-26 LAB — PROCALCITONIN: Procalcitonin: 97.57 ng/mL

## 2021-06-26 MED ORDER — LACTATED RINGERS IV BOLUS
1000.0000 mL | Freq: Once | INTRAVENOUS | Status: AC
  Administered 2021-06-26: 1000 mL via INTRAVENOUS

## 2021-06-26 MED ORDER — INSULIN ASPART 100 UNIT/ML IJ SOLN
0.0000 [IU] | INTRAMUSCULAR | Status: DC
  Administered 2021-06-26: 3 [IU] via SUBCUTANEOUS
  Administered 2021-06-26: 5 [IU] via SUBCUTANEOUS
  Administered 2021-06-27: 3 [IU] via SUBCUTANEOUS
  Filled 2021-06-26 (×4): qty 1

## 2021-06-26 MED ORDER — VANCOMYCIN HCL 750 MG/150ML IV SOLN
750.0000 mg | Freq: Once | INTRAVENOUS | Status: AC
  Administered 2021-06-26: 750 mg via INTRAVENOUS
  Filled 2021-06-26: qty 150

## 2021-06-26 MED ORDER — SODIUM CHLORIDE 0.9 % IV SOLN
2.0000 g | Freq: Two times a day (BID) | INTRAVENOUS | Status: AC
  Administered 2021-06-26 – 2021-06-27 (×4): 2 g via INTRAVENOUS
  Filled 2021-06-26: qty 12.5
  Filled 2021-06-26 (×3): qty 2

## 2021-06-26 MED ORDER — ORAL CARE MOUTH RINSE: 15.0000 mL | Freq: Two times a day (BID) | OROMUCOSAL | Status: DC

## 2021-06-26 MED ORDER — PANTOPRAZOLE SODIUM 40 MG IV SOLR
40.0000 mg | INTRAVENOUS | Status: DC
  Administered 2021-06-26 – 2021-06-27 (×2): 40 mg via INTRAVENOUS
  Filled 2021-06-26 (×2): qty 10

## 2021-06-26 MED ORDER — METRONIDAZOLE 500 MG/100ML IV SOLN
500.0000 mg | Freq: Two times a day (BID) | INTRAVENOUS | Status: DC
  Administered 2021-06-26 (×2): 500 mg via INTRAVENOUS
  Filled 2021-06-26 (×3): qty 100

## 2021-06-26 MED ORDER — FENTANYL CITRATE PF 50 MCG/ML IJ SOSY: 25.0000 ug | PREFILLED_SYRINGE | INTRAMUSCULAR | Status: DC | PRN

## 2021-06-26 MED ORDER — PROPOFOL 1000 MG/100ML IV EMUL
5.0000 ug/kg/min | INTRAVENOUS | Status: DC
  Administered 2021-06-26: 15 ug/kg/min via INTRAVENOUS

## 2021-06-26 MED ORDER — VANCOMYCIN HCL 1250 MG/250ML IV SOLN
1250.0000 mg | Freq: Once | INTRAVENOUS | Status: DC
  Filled 2021-06-26: qty 250

## 2021-06-26 MED ORDER — DEXTROSE IN LACTATED RINGERS 5 % IV SOLN: INTRAVENOUS | Status: DC

## 2021-06-26 NOTE — Progress Notes (Signed)
Patient's blood sugar was 94, notified Dr. Belia Heman and received order to change patient to D5LR at 19mL/hr from LR at 75mL/hr.

## 2021-06-26 NOTE — Progress Notes (Signed)
NAME:  Lori Petersen [facility], MRN:  [id number], DOB:  11/27/1955, LOS: 1 ADMISSION DATE:  06/25/2021   BRIEF SYNOPSIS ADMITTED FOR  SEPTIC SHOCK AND OBSTRUCTED KIDNEY STONE   CC Follow up septic shock   Significant Hospital Events: Including procedures, antibiotic start and stop dates in addition to other pertinent events   Admitted 6/10 admitted for septic shock 6/10 s/p ureteral stent placement 6/11 remains on vent, septic shock, plan for WUA      Micro Data:  CULTURES PENDING  Antimicrobials:   Antibiotics Given (last 72 hours)     Date/Time Action Medication Dose Rate   06/25/21 1126 New Bag/Given   ceFEPIme (MAXIPIME) 2 g in sodium chloride 0.9 % 100 mL IVPB 2 g 200 mL/hr   06/25/21 1147 New Bag/Given   metroNIDAZOLE (FLAGYL) IVPB 500 mg 500 mg 100 mL/hr   06/25/21 1244 New Bag/Given   vancomycin (VANCOREADY) IVPB 1500 mg/300 mL 1,500 mg 150 mL/hr        Interim History / Subjective:  Remains on vent Remains on pressors Septic shock    Objective   Blood pressure 107/65, pulse (!) 104, temperature 98.1 F (36.7 C), temperature source Oral, resp. rate 16, height _0  (1.727 m), weight 93.1 kg, SpO2 (!) 89 %.    Vent Mode: PRVC FiO2 (%):  [28 %] 28 % Set Rate:  [16 bmp] 16 bmp Vt Set:  [510 mL] 510 mL PEEP:  [5 cmH20] 5 cmH20 Plateau Pressure:  [18 cmH20-20 cmH20] 18 cmH20   Intake/Output Summary (Last 24 hours) at 06/26/2021 0725 Last data filed at 06/26/2021 0600 Gross per 24 hour  Intake 3325.66 ml  Output 1750 ml  Net 1575.66 ml   Filed Weights   06/25/21 1110 06/25/21 1419 06/26/21 0500  Weight: 88.9 kg 93.1 kg 93.1 kg      REVIEW OF SYSTEMS  PATIENT IS UNABLE TO PROVIDE COMPLETE REVIEW OF SYSTEMS DUE TO SEVERE CRITICAL ILLNESS AND TOXIC METABOLIC ENCEPHALOPATHY  ALL OTHER ROS ARE NEGATIVE   PHYSICAL EXAMINATION:  GENERAL:critically ill appearing, +resp distress EYES: Pupils equal, round, reactive to light.  No scleral icterus.   MOUTH: Moist mucosal membrane. INTUBATED NECK: Supple.  PULMONARY: +rhonchi, +wheezing CARDIOVASCULAR: S1 and S2.  No murmurs  GASTROINTESTINAL: Soft, nontender, -distended. Positive bowel sounds.  MUSCULOSKELETAL: No swelling, clubbing, or edema.  NEUROLOGIC: obtunded SKIN:intact,warm,dry    Labs/imaging that I havepersonally reviewed  (right click and "Reselect all SmartList Selections" daily)     ASSESSMENT AND PLAN SYNOPSIS  66 yo with Downs Syndrome with severe septic shock and severe acidosis leading to severe toxic metabolic acidosis and encephalopathy with severe hypoxic resp failure and renal failure    Severe ACUTE Hypoxic and Hypercapnic Respiratory Failure -continue Mechanical Ventilator support -continue Bronchodilator Therapy -Wean Fio2 and PEEP as tolerated -VAP/VENT bundle implementation -will perform SAT/SBT when respiratory parameters are met  Vent Mode: PRVC FiO2 (%):  [28 %] 28 % Set Rate:  [16 bmp] 16 bmp Vt Set:  [510 mL] 510 mL PEEP:  [5 cmH20] 5 cmH20 Plateau Pressure:  [18 cmH20-20 cmH20] 18 cmH20  CARDIAC ICU monitoring   ACUTE KIDNEY INJURY/Renal Failure -continue Foley Catheter-assess need -Avoid nephrotoxic agents -Follow urine output, BMP -Ensure adequate renal perfusion, optimize oxygenation -Renal dose medications   Intake/Output Summary (Last 24 hours) at 06/26/2021 0725 Last data filed at 06/26/2021 0600 Gross per 24 hour  Intake 3325.66 ml  Output 1750 ml  Net 1575.66 ml     NEUROLOGY  Acute toxic metabolic encephalopathy acidosis WUA pending   SEPTIC SHOCK SOURCE-urine -use vasopressors to keep MAP>65 as needed -follow ABG and LA -follow up cultures -emperic ABX -aggressive IV fluid resuscitation  INFECTIOUS DISEASE -continue antibiotics as prescribed -follow up cultures  ENDO - ICU hypoglycemic\Hyperglycemia protocol -check FSBS per protocol   GI GI PROPHYLAXIS as indicated  NUTRITIONAL  STATUS DIET-->NPO Constipation protocol as indicated   ELECTROLYTES -follow labs as needed -replace as needed -pharmacy consultation and following     Best practice (right click and "Reselect all SmartList Selections" daily)  Diet:  NPO Pain/Anxiety/Delirium protocol (if indicated): Yes (RASS goal 0) VAP protocol (if indicated): Yes DVT prophylaxis: Subcutaneous Heparin GI prophylaxis: PPI Glucose control:  SSI No Central venous access:  N/A Arterial line:  Yes, and it is still needed Foley:  Yes, and it is still needed Mobility:  bed rest  Code Status:  FULL CODE Disposition: ICU  Labs   CBC: Recent Labs  Lab 06/25/21 1119 06/26/21 0502  WBC 52.2* 50.3*  NEUTROABS 46.3*  --   HGB 9.8* 9.0*  HCT 33.5* 28.6*  MCV 105.7* 100.4*  PLT 91* 59*    Basic Metabolic Panel: Recent Labs  Lab 06/25/21 1119 06/25/21 2028 06/26/21 0502  NA 140 139 141  K 5.5* 5.3* 4.3  CL 108 110 111  CO2 22 18* 21*  GLUCOSE 125* 205* 249*  BUN 25* 29* 29*  CREATININE 3.05* 2.49* 1.89*  CALCIUM 7.5* 7.3* 7.2*  MG  --  1.8 1.9  PHOS  --  4.5 3.2   GFR: Estimated Creatinine Clearance: 35.4 mL/min (A) (by C-G formula based on SCr of 1.89 mg/dL (H)). Recent Labs  Lab 06/25/21 1119 06/25/21 1335 06/25/21 2028 06/26/21 0033 06/26/21 0502  PROCALCITON >150.00  --   --   --  97.57  WBC 52.2*  --   --   --  50.3*  LATICACIDVEN 5.1* 5.0* 4.0* 5.8* 4.4*    Liver Function Tests: Recent Labs  Lab 06/25/21 1119  AST 84*  ALT 32  ALKPHOS 52  BILITOT 0.5  PROT 5.9*  ALBUMIN 2.5*   No results for input(s): "LIPASE", "AMYLASE" in the last 168 hours. No results for input(s): "AMMONIA" in the last 168 hours.  ABG    Component Value Date/Time   PHART 7.32 (L) 06/25/2021 1751   PCO2ART 37 06/25/2021 1751   PO2ART 88 06/25/2021 1751   HCO3 19.1 (L) 06/25/2021 1751   ACIDBASEDEF 6.4 (H) 06/25/2021 1751   O2SAT 98.8 06/25/2021 1751     Coagulation Profile: Recent Labs  Lab  06/25/21 1119  INR 1.4*    Cardiac Enzymes: No results for input(s): "CKTOTAL", "CKMB", "CKMBINDEX", "TROPONINI" in the last 168 hours.  HbA1C: Hgb A1c MFr Bld  Date/Time Value Ref Range Status  05/17/2021 01:08 PM 5.9 (H) 4.8 - 5.6 % Final    Comment:    (NOTE) Pre diabetes:          5.7%-6.4%  Diabetes:              >6.4%  Glycemic control for   <7.0% adults with diabetes     CBG: Recent Labs  Lab 06/24/21 0919 06/25/21 2041 06/25/21 2332 06/26/21 0319  GLUCAP 98 149* 201* 238*    Allergies No Known Allergies      Critical Care Time devoted to patient care services described in this note is 45  minutes.  Critical care was necessary to treat or prevent imminent or life-threatening  deterioration.   Patient with Multiorgan failure and at high risk for cardiac arrest and death.    Corrin Parker, M.D.  [facility]  Medical Director [place] Director [facility] Cardio-Pulmonary Department

## 2021-06-26 NOTE — Progress Notes (Signed)
Extubation Procedure Note  Patient Details:   Name: Lori Petersen [facility] DOB: Jan 24, 1955 MRN: [id number]   Airway Documentation:    Vent end date: (P) 06/26/21 Vent end time: (P) 1008   Evaluation  O2 sats: stable throughout Complications: No apparent complications Patient did tolerate procedure well. Bilateral Breath Sounds: Diminished   No  Patient extubated to 2L [place] per MD order. Oxygen saturation on 2L is 95% and no distress noted.  Carollee Herter N [name] 06/26/2021, 10:18 AM

## 2021-06-26 NOTE — Progress Notes (Signed)
PHARMACY - PHYSICIAN COMMUNICATION CRITICAL VALUE ALERT - BLOOD CULTURE IDENTIFICATION (BCID)  Lori Petersen [facility] is an 66 y.o. female with developmental delay, dementia, Down syndrome, recent  cystoscopy and laser lithotripsy who presented to [facility] on 06/25/2021 with a chief complaint of septic shock and obstructed kidney stone and sacral pressure injury  Assessment:  3/4 E coli, no resistance patterns detected  Name of physician (or Provider) Contacted: Leanord Asal, NP  Antimicrobials this admission: 06/10 vancomycin >>  06/10 cefepime >>  06/11 metronidazole >>  Changes to prescribed antibiotics recommended:   Added metronidazole until abdominal sources ruled out, vancomycin and cefepime continued  Results for orders placed or performed during the hospital encounter of 06/25/21  Blood Culture ID Panel (Reflexed) (Collected: 06/25/2021 11:20 AM)  Result Value Ref Range   Enterococcus faecalis NOT DETECTED NOT DETECTED   Enterococcus Faecium NOT DETECTED NOT DETECTED   Listeria monocytogenes NOT DETECTED NOT DETECTED   Staphylococcus species NOT DETECTED NOT DETECTED   Staphylococcus aureus (BCID) NOT DETECTED NOT DETECTED   Staphylococcus epidermidis NOT DETECTED NOT DETECTED   Staphylococcus lugdunensis NOT DETECTED NOT DETECTED   Streptococcus species NOT DETECTED NOT DETECTED   Streptococcus agalactiae NOT DETECTED NOT DETECTED   Streptococcus pneumoniae NOT DETECTED NOT DETECTED   Streptococcus pyogenes NOT DETECTED NOT DETECTED   A.calcoaceticus-baumannii NOT DETECTED NOT DETECTED   Bacteroides fragilis NOT DETECTED NOT DETECTED   Enterobacterales DETECTED (A) NOT DETECTED   Enterobacter cloacae complex NOT DETECTED NOT DETECTED   Escherichia coli DETECTED (A) NOT DETECTED   Klebsiella aerogenes NOT DETECTED NOT DETECTED   Klebsiella oxytoca NOT DETECTED NOT DETECTED   Klebsiella pneumoniae NOT DETECTED NOT DETECTED   Proteus species NOT DETECTED NOT DETECTED   Salmonella  species NOT DETECTED NOT DETECTED   Serratia marcescens NOT DETECTED NOT DETECTED   Haemophilus influenzae NOT DETECTED NOT DETECTED   Neisseria meningitidis NOT DETECTED NOT DETECTED   Pseudomonas aeruginosa NOT DETECTED NOT DETECTED   Stenotrophomonas maltophilia NOT DETECTED NOT DETECTED   Candida albicans NOT DETECTED NOT DETECTED   Candida auris NOT DETECTED NOT DETECTED   Candida glabrata NOT DETECTED NOT DETECTED   Candida krusei NOT DETECTED NOT DETECTED   Candida parapsilosis NOT DETECTED NOT DETECTED   Candida tropicalis NOT DETECTED NOT DETECTED   Cryptococcus neoformans/gattii NOT DETECTED NOT DETECTED   CTX-M ESBL NOT DETECTED NOT DETECTED   Carbapenem resistance IMP NOT DETECTED NOT DETECTED   Carbapenem resistance KPC NOT DETECTED NOT DETECTED   Carbapenem resistance NDM NOT DETECTED NOT DETECTED   Carbapenem resist OXA 48 LIKE NOT DETECTED NOT DETECTED   Carbapenem resistance VIM NOT DETECTED NOT DETECTED    Lowella Bandy 06/26/2021  9:14 AM

## 2021-06-26 NOTE — Progress Notes (Signed)
Patient's ETT and OG tube removed at 10:08am by RT per Dr. Belia Heman order.  Patient tolerated well and placed on 4L [place].

## 2021-06-26 NOTE — Plan of Care (Signed)
Continuing with plan of care. 

## 2021-06-26 NOTE — Consult Note (Signed)
[facility] Nurse Consult Note: Reason for Consult:Stage 3 healing pressure injury to sacrum at coccygeal area. Macerated tissue surrounding. Shearing skin injuries noted to left buttock Wound type:Pressure, shear force skin injury Pressure Injury POA: Yes Measurement:To be obtained by Bedside RN today and documented on Nursing Flow Sheet Wound SF:[id number] obscured by the presence of macerated tissue, small area of pink tissue evident Drainage (amount, consistency, odor) scant serous Periwound:as noted above Dressing procedure/placement/frequency: While in ICU, patient is on a mattress replacement with low air loss feature, she is additionally being turned and repositioned from side to side. AS an existing pressure injury places the patient at increased risk for other pressure injuries, bilateral heel pressure redistribution boots are provided today. Topical care to the healing Stage 3 PI will be with a silver hydrofiber to both donate antimicrobial properties to the area and absorb any excess moisture from the tissue. This will be topped with a silicone foam dressing placed so that the "tip" is oriented away from the anus, allowing for maximum coverage to the buttocks, which have sustained shear force injury. For prevention of shear force injury post discharge, a pressure redistribution chair cushion is provided and will be sent home with patient upon discharge.   [place] nursing team will not follow, but will remain available to this patient, the nursing and medical teams.  Please re-consult if needed.  Thank you for inviting [place] to participate in this patient's Plan of Care.  Maudie Flakes, MSN, RN, CNS, [place], Serita Grammes, [employer], [employer] phone:  [phone]

## 2021-06-26 NOTE — Progress Notes (Signed)
Pharmacy Electrolyte Monitoring Consult:  Pharmacy consulted to assist in monitoring and replacing electrolytes in this 66 y.o. female admitted on 06/25/2021 with Hypotension sepsis  Labs:  Sodium (mmol/L)  Date Value  06/26/2021 141  02/22/2013 141   Potassium (mmol/L)  Date Value  06/26/2021 4.3  02/22/2013 3.8   Magnesium (mg/dL)  Date Value  06/26/2021 1.9   Phosphorus (mg/dL)  Date Value  06/26/2021 3.2   Calcium (mg/dL)  Date Value  06/26/2021 7.2 (L)   Calcium, Total (mg/dL)  Date Value  02/22/2013 8.7   Albumin (g/dL)  Date Value  06/25/2021 2.5 (L)  10/27/2011 3.2 (L)    Assessment/Plan: -no electrolyte replacement needed at current time -f/u electrolytes with am labs  Lori Petersen A 06/26/2021 7:50 AM

## 2021-06-26 NOTE — Progress Notes (Signed)
Pharmacy Antibiotic Note  Lori Petersen [facility] is an 66 y.o. female with developmental delay, dementia, Down syndrome, recent  cystoscopy and laser lithotripsy who presented to [facility] on 06/25/2021 with a chief complaint of septic shock and obstructed kidney stone and sacral pressure injury. Pharmacy has been consulted for vancomycin and cefepime dosing. Renal function has improved since admission  Plan:  1) adjust cefepime to 2 grams IV every 12 hours  2) vancomycin random level 06/26/21 0502: 13 mcg/mL (goal 10 - 15 mcg/mL)  750mg   IV vancomycin x 1 variable dose marked placed given impaired renal function vancomycin random level in am BMP in am Daily renal function assessment: repeat BMP in am   Height: 5\' 8"  (172.7 cm) Weight: 93.1 kg (205 lb 4 oz) IBW/kg (Calculated) : 63.9  Temp (24hrs), Avg:98.4 F (36.9 C), Min:97.6 F (36.4 C), Max:99.3 F (37.4 C)  Recent Labs  Lab 06/25/21 1119 06/25/21 1335 06/25/21 2028 06/26/21 0033 06/26/21 0502  WBC 52.2*  --   --   --  50.3*  CREATININE 3.05*  --  2.49*  --  1.89*  LATICACIDVEN 5.1* 5.0* 4.0* 5.8* 4.4*  VANCORANDOM  --   --   --   --  13     Estimated Creatinine Clearance: 35.4 mL/min (A) (by C-G formula based on SCr of 1.89 mg/dL (H)).    No Known Allergies  Antimicrobials this admission: 06/10 vancomycin >>  06/10 cefepime >>  06/11 metronidazole >>  Microbiology results: 06/10 BCx: pending 06/10 UCx: pending    Thank you for allowing pharmacy to be a part of this patient's care.  08/10 06/26/2021 7:17 AM

## 2021-06-27 ENCOUNTER — Encounter: Payer: Self-pay | Admitting: Urology

## 2021-06-27 DIAGNOSIS — N132 Hydronephrosis with renal and ureteral calculous obstruction: Secondary | ICD-10-CM | POA: Diagnosis not present

## 2021-06-27 DIAGNOSIS — A419 Sepsis, unspecified organism: Secondary | ICD-10-CM | POA: Diagnosis not present

## 2021-06-27 DIAGNOSIS — D72829 Elevated white blood cell count, unspecified: Secondary | ICD-10-CM

## 2021-06-27 DIAGNOSIS — R6521 Severe sepsis with septic shock: Secondary | ICD-10-CM | POA: Diagnosis not present

## 2021-06-27 DIAGNOSIS — N201 Calculus of ureter: Secondary | ICD-10-CM | POA: Diagnosis not present

## 2021-06-27 LAB — GLUCOSE, CAPILLARY
Glucose-Capillary: 108 mg/dL — ABNORMAL HIGH (ref 70–99)
Glucose-Capillary: 164 mg/dL — ABNORMAL HIGH (ref 70–99)
Glucose-Capillary: 146 mg/dL — ABNORMAL HIGH (ref 70–99)
Glucose-Capillary: 159 mg/dL — ABNORMAL HIGH (ref 70–99)
Glucose-Capillary: 96 mg/dL (ref 70–99)
Glucose-Capillary: 64 mg/dL — ABNORMAL LOW (ref 70–99)
Glucose-Capillary: 93 mg/dL (ref 70–99)
Glucose-Capillary: 65 mg/dL — ABNORMAL LOW (ref 70–99)
Glucose-Capillary: 93 mg/dL (ref 70–99)
Glucose-Capillary: 78 mg/dL (ref 70–99)

## 2021-06-27 LAB — CBC
HCT: 29.2 % — ABNORMAL LOW (ref 36.0–46.0)
Hemoglobin: 8.8 g/dL — ABNORMAL LOW (ref 12.0–15.0)
MCH: 30.8 pg (ref 26.0–34.0)
MCHC: 30.1 g/dL (ref 30.0–36.0)
MCV: 102.1 fL — ABNORMAL HIGH (ref 80.0–100.0)
Platelets: 47 10*3/uL — ABNORMAL LOW (ref 150–400)
RBC: 2.86 MIL/uL — ABNORMAL LOW (ref 3.87–5.11)
RDW: 18.5 % — ABNORMAL HIGH (ref 11.5–15.5)
WBC: 49.9 10*3/uL — ABNORMAL HIGH (ref 4.0–10.5)
nRBC: 0 % (ref 0.0–0.2)

## 2021-06-27 LAB — BLOOD GAS, ARTERIAL
Acid-base deficit: 0.8 mmol/L (ref 0.0–2.0)
Bicarbonate: 24.9 mmol/L (ref 20.0–28.0)
FIO2: 36 %
O2 Content: 4 L/min
O2 Saturation: 99.9 %
Patient temperature: 37
pCO2 arterial: 44 mmHg (ref 32–48)
pH, Arterial: 7.36 (ref 7.35–7.45)
pO2, Arterial: 123 mmHg — ABNORMAL HIGH (ref 83–108)

## 2021-06-27 LAB — URINE CULTURE: Culture: 2000 — AB

## 2021-06-27 LAB — BASIC METABOLIC PANEL
Anion gap: 6 (ref 5–15)
BUN: 29 mg/dL — ABNORMAL HIGH (ref 8–23)
CO2: 26 mmol/L (ref 22–32)
Calcium: 7.3 mg/dL — ABNORMAL LOW (ref 8.9–10.3)
Chloride: 112 mmol/L — ABNORMAL HIGH (ref 98–111)
Creatinine, Ser: 1.49 mg/dL — ABNORMAL HIGH (ref 0.44–1.00)
GFR, Estimated: 39 mL/min — ABNORMAL LOW (ref >60)
Glucose, Bld: 135 mg/dL — ABNORMAL HIGH (ref 70–99)
Potassium: 4.4 mmol/L (ref 3.5–5.1)
Sodium: 144 mmol/L (ref 135–145)

## 2021-06-27 LAB — MAGNESIUM: Magnesium: 1.9 mg/dL (ref 1.7–2.4)

## 2021-06-27 LAB — VANCOMYCIN, RANDOM: Vancomycin Rm: 14 ug/mL

## 2021-06-27 LAB — PROCALCITONIN: Procalcitonin: 51.03 ng/mL

## 2021-06-27 LAB — PHOSPHORUS: Phosphorus: 2.7 mg/dL (ref 2.5–4.6)

## 2021-06-27 MED ORDER — LEVETIRACETAM 500 MG/5ML IV SOLN
750.0000 mg | Freq: Two times a day (BID) | INTRAVENOUS | Status: DC
  Administered 2021-06-27 – 2021-06-28 (×3): 750 mg via INTRAVENOUS
  Filled 2021-06-27 (×4): qty 7.5

## 2021-06-27 MED ORDER — SODIUM CHLORIDE 0.9 % IV SOLN
2.0000 g | INTRAVENOUS | Status: AC
  Administered 2021-06-28: 2 g via INTRAVENOUS
  Filled 2021-06-27: qty 2

## 2021-06-27 MED ORDER — DEXTROSE 50 % IV SOLN
12.5000 g | INTRAVENOUS | Status: AC
  Administered 2021-06-27: 12.5 g via INTRAVENOUS
  Filled 2021-06-27: qty 50

## 2021-06-27 MED ORDER — DEXTROSE IN LACTATED RINGERS 5 % IV SOLN: INTRAVENOUS | Status: DC

## 2021-06-27 MED ORDER — LORAZEPAM 2 MG/ML IJ SOLN: 2.0000 mg | Freq: Four times a day (QID) | INTRAMUSCULAR | Status: DC | PRN

## 2021-06-27 MED ORDER — SODIUM CHLORIDE 0.9 % IV SOLN: INTRAVENOUS | Status: DC

## 2021-06-27 MED ORDER — DEXTROSE 50 % IV SOLN
INTRAVENOUS | Status: AC
  Filled 2021-06-27: qty 50

## 2021-06-27 MED ORDER — DEXTROSE 50 % IV SOLN
12.5000 g | INTRAVENOUS | Status: AC
  Administered 2021-06-27: 12.5 g via INTRAVENOUS

## 2021-06-27 MED ORDER — LORAZEPAM 2 MG/ML IJ SOLN: 1.0000 mg | Freq: Four times a day (QID) | INTRAMUSCULAR | Status: DC | PRN

## 2021-06-27 NOTE — Progress Notes (Signed)
Urology Inpatient Progress Note  Subjective: She was extubated overnight and is now on 2 L nasal cannula.  She is afebrile, VSS. WBC count stable, 49.9.  Hemoglobin stable, 8.8.  Creatinine down, 1.49.  Admission urine culture and Intra-Op urine culture pending.  On antibiotics as below for Foley catheter in place draining clear, yellow urine. She is asleep this morning and unable to contribute to HPI.  Anti-infectives: Anti-infectives (From admission, onward)    Start     Dose/Rate Route Frequency Ordered Stop   06/26/21 1200  ceFEPIme (MAXIPIME) 2 g in sodium chloride 0.9 % 100 mL IVPB  Status:  Discontinued        2 g 200 mL/hr over 30 Minutes Intravenous Every 24 hours 06/25/21 1422 06/26/21 0724   06/26/21 1200  vancomycin (VANCOREADY) IVPB 750 mg/150 mL        750 mg 150 mL/hr over 60 Minutes Intravenous  Once 06/26/21 0937 06/26/21 1345   06/26/21 1100  metroNIDAZOLE (FLAGYL) IVPB 500 mg        500 mg 100 mL/hr over 60 Minutes Intravenous Every 12 hours 06/26/21 0935     06/26/21 1000  ceFEPIme (MAXIPIME) 2 g in sodium chloride 0.9 % 100 mL IVPB        2 g 200 mL/hr over 30 Minutes Intravenous Every 12 hours 06/26/21 0724     06/26/21 0900  vancomycin (VANCOREADY) IVPB 1250 mg/250 mL  Status:  Discontinued        1,250 mg 166.7 mL/hr over 90 Minutes Intravenous  Once 06/26/21 0747 06/26/21 0937   06/25/21 1422  vancomycin variable dose per unstable renal function (pharmacist dosing)         Does not apply See admin instructions 06/25/21 1422     06/25/21 1130  vancomycin (VANCOREADY) IVPB 1500 mg/300 mL        1,500 mg 150 mL/hr over 120 Minutes Intravenous  Once 06/25/21 1124 06/25/21 1444   06/25/21 1115  ceFEPIme (MAXIPIME) 2 g in sodium chloride 0.9 % 100 mL IVPB        2 g 200 mL/hr over 30 Minutes Intravenous  Once 06/25/21 1110 06/25/21 1158   06/25/21 1115  metroNIDAZOLE (FLAGYL) IVPB 500 mg        500 mg 100 mL/hr over 60 Minutes Intravenous  Once 06/25/21 1110  06/25/21 1247   06/25/21 1115  vancomycin (VANCOCIN) IVPB 1000 mg/200 mL premix  Status:  Discontinued        1,000 mg 200 mL/hr over 60 Minutes Intravenous  Once 06/25/21 1110 06/25/21 1124       Current Facility-Administered Medications  Medication Dose Route Frequency Provider Last Rate Last Admin   0.9 %  sodium chloride infusion  250 mL Intravenous Continuous Stoioff, Scott C, MD       ceFEPIme (MAXIPIME) 2 g in sodium chloride 0.9 % 100 mL IVPB  2 g Intravenous Q12H Dallie Piles, RPH 200 mL/hr at 06/27/21 0800 Infusion Verify at 06/27/21 0800   chlorhexidine (PERIDEX) 0.12 % solution 15 mL  15 mL Mouth Rinse BID Stoioff, Scott C, MD   15 mL at 06/27/21 0751   Chlorhexidine Gluconate Cloth 2 % PADS 6 each  6 each Topical Q0600 Stoioff, Scott C, MD       dextrose 5 % in lactated ringers infusion   Intravenous Continuous Rust-Chester, Britton L, NP 20 mL/hr at 06/27/21 0800 Infusion Verify at 06/27/21 0800   docusate sodium (COLACE) capsule 100 mg  100  mg Oral BID PRN Abbie Sons, MD       heparin injection 5,000 Units  5,000 Units Subcutaneous Q8H Stoioff, Scott C, MD   5,000 Units at 06/27/21 0526   insulin aspart (novoLOG) injection 0-15 Units  0-15 Units Subcutaneous Q4H Rust-Chester, Britton L, NP   3 Units at 06/27/21 0751   ipratropium-albuterol (DUONEB) 0.5-2.5 (3) MG/3ML nebulizer solution 3 mL  3 mL Nebulization Q6H Stoioff, Scott C, MD   3 mL at 06/27/21 0755   ipratropium-albuterol (DUONEB) 0.5-2.5 (3) MG/3ML nebulizer solution 3 mL  3 mL Nebulization Q6H PRN Stoioff, Scott C, MD       metroNIDAZOLE (FLAGYL) IVPB 500 mg  500 mg Intravenous Q12H Darel Hong D, NP   Stopped at 06/26/21 2342   pantoprazole (PROTONIX) injection 40 mg  40 mg Intravenous Q24H Rust-Chester, Britton L, NP   40 mg at 06/26/21 2137   polyethylene glycol (MIRALAX / GLYCOLAX) packet 17 g  17 g Oral Daily PRN Stoioff, Scott C, MD       vancomycin variable dose per unstable renal function  (pharmacist dosing)   Does not apply See admin instructions Stoioff, Ronda Fairly, MD         Objective: Vital signs in last 24 hours: Temp:  [97.8 F (36.6 C)-99.3 F (37.4 C)] 98.8 F (37.1 C) (06/12 0745) Pulse Rate:  [88-109] 89 (06/12 0800) Resp:  [14-27] 21 (06/12 0800) BP: (92-176)/(42-151) 125/81 (06/12 0800) SpO2:  [90 %-100 %] 96 % (06/12 0800) Arterial Line BP: (79-246)/(39-74) 246/39 (06/12 0800) FiO2 (%):  [28 %] 28 % (06/11 1008) Weight:  [93.1 kg] 93.1 kg (06/12 0500)  Intake/Output from previous day: 06/11 0701 - 06/12 0700 In: 2058.1 [I.V.:1615.6; IV Piggyback:442.5] Out: [id number] [Urine:1275] Intake/Output this shift: Total I/O In: 157.6 [I.V.:40; IV Piggyback:117.6] Out: -   Physical Exam Vitals and nursing note reviewed.  Constitutional:      General: She is sleeping. She is not in acute distress.    Appearance: She is ill-appearing. She is not toxic-appearing or diaphoretic.  HENT:     Head: Normocephalic and atraumatic.  Pulmonary:     Effort: Pulmonary effort is normal. No respiratory distress.  Skin:    General: Skin is warm and dry.    Lab Results:  Recent Labs    06/26/21 0502 06/27/21 0536  WBC 50.3* 49.9*  HGB 9.0* 8.8*  HCT 28.6* 29.2*  PLT 59* 47*   BMET Recent Labs    06/26/21 0502 06/27/21 0536  NA 141 144  K 4.3 4.4  CL 111 112*  CO2 21* 26  GLUCOSE 249* 135*  BUN 29* 29*  CREATININE 1.89* 1.49*  CALCIUM 7.2* 7.3*   PT/INR Recent Labs    06/25/21 1119  LABPROT 16.9*  INR 1.4*   ABG Recent Labs    06/25/21 1751 06/27/21 0754  PHART 7.32* 7.36  HCO3 19.1* 24.9    Assessment & Plan: 66 year old female admitted with septic shock secondary to an obstructing distal left ureteral stone fragment after inadvertent stent removal following outpatient ureteroscopy, now POD 2 from left ureteral stent placement with Dr. Bernardo Heater.  Slight clinical improvement with downtrending creatinine, however she continues to have marked  leukocytosis.  Recommendations: -Continue antibiotics and follow urine cultures.  She will require 14 days of culture appropriate therapy -Continue Foley catheter per primary team -Cystoscopy stent removal with Dr. Bernardo Heater in 7 to 10 days  Debroah Loop, PA-C 06/27/2021

## 2021-06-27 NOTE — TOC Initial Note (Signed)
Transition of Care [facility]) - Initial/Assessment Note    Patient Details  Name: Lori Petersen [facility] MRN: [id number] Date of Birth: 1955/02/20  Transition of Care [facility]) CM/SW Contact:    Allayne Butcher, RN Phone Number: 06/27/2021, 11:02 AM  Clinical Narrative:                 Patient admitted to the hospital with septic shock.  Patient was extubated yesterday and is tolerating [place] 2L.  Patient is lethargic and not alert or following commands today, remains NPO, could not participate with speech.  Patient is a [facility] patient and lives at [facility].  RNCM was able to speak with patient's physician Dr. Arta Silence with [facility]- [phone]. Dr. Arta Silence reports that patient is normally alert, she can say some words but does not talk much.   [facility] will be able to provide transportation back to [facility] at discharge if discharge is M-F 8-5.    Dr. Arta Silence requests an update from the hospitalist, hospitalist given Dr. Azzie Glatter contact information.    Expected Discharge Plan: Skilled Nursing Facility Barriers to Discharge: Continued Medical Work up   Patient Goals and CMS Choice Patient states their goals for this hospitalization and ongoing recovery are:: plan for patient to return to [facility] at DC [facility] Medicare.gov Compare Post Acute Care list provided to:: Patient Represenative (must comment) Choice offered to / list presented to : [facility] POA / Guardian  Expected Discharge Plan and Services Expected Discharge Plan: Skilled Nursing Facility   Discharge Planning Services: CM Consult Post Acute Care Choice: Resumption of Svcs/PTA Provider Living arrangements for the past 2 months: Skilled Nursing Facility                 DME Arranged: N/A DME Agency: NA       HH Arranged: NA HH Agency: NA        Prior Living Arrangements/Services Living arrangements for the past 2 months: Skilled Nursing Facility Lives with:: Facility Resident Patient language and need for interpreter reviewed:: Yes Do you feel safe going  back to the place where you live?: Yes      Need for Family Participation in Patient Care: Yes (Comment) Care giver support system in place?: Yes (comment)   Criminal Activity/Legal Involvement Pertinent to Current Situation/Hospitalization: No - Comment as needed  Activities of Daily Living      Permission Sought/Granted Permission sought to share information with : Case Manager, Magazine features editor, Other (comment) Permission granted to share information with : Yes, Verbal Permission Granted  Share Information with NAME: [name]  Permission granted to share info w AGENCY: [facility]  Dr. Arta [phone]  Permission granted to share info w Relationship: legal guardian  Permission granted to share info w Contact Information: [phone]  Emotional Assessment Appearance:: Appears older than stated age Attitude/Demeanor/Rapport: Lethargic Affect (typically observed): Unable to Assess   Alcohol / Substance Use: Not Applicable Psych Involvement: No (comment)  Admission diagnosis:  Kidney stone [N20.0] Acute respiratory failure with hypoxia (HCC) [J96.01] AKI (acute kidney injury) (HCC) [N17.9] Septic shock (HCC) [A41.9, R65.21] Sepsis, due to unspecified organism, unspecified whether acute organ dysfunction present [facility]) [A41.9] Patient Active Problem List   Diagnosis Date Noted   Hydronephrosis with urinary obstruction due to ureteral calculus    Respiratory failure (HCC) 05/18/2021   Pneumonia 05/17/2021   Dementia (HCC)    HLD (hyperlipidemia)    Sepsis (HCC)    Hypernatremia 03/26/2021   Thrombocytopenia (HCC) 03/26/2021   Asthma, mild  intermittent 03/25/2021   Nausea & vomiting 03/25/2021   Bacteremia due to Escherichia coli 03/25/2021   Complicated UTI (urinary tract infection) 03/25/2021   Down syndrome 03/25/2021   AKI (acute kidney injury) (HCC) 03/24/2021   Septic shock (HCC)    Ureteral calculus    Seizures (HCC) 12/03/2018   Obesity, Class  III, 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   Chronic diastolic CHF (congestive heart failure) (HCC) 11/06/2018   Acute metabolic encephalopathy 11/06/2018   Hypothyroid 11/06/2018   COVID-19 virus infection 11/03/2018   COVID-19    Acute respiratory failure with hypoxia (HCC) 11/02/2018   Left tibial fracture 04/27/2017   Right tibial fracture 04/27/2017   Problems with swallowing and mastication    Blood in stool    Bradycardia 05/14/2015   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 [place] [address] Phone: [phone] Fax: [phone]  [facility] [phone] [place], [place] - [address] [place] [address] Phone: [phone] Fax: [phone]  [facility] [place], [place] - [address] [place] [address] Phone: [phone] Fax: [phone]     Social Determinants of Health (SDOH) Interventions    Readmission Risk Interventions    06/27/2021   11:00 AM  Readmission Risk Prevention Plan  Transportation Screening Complete  Medication Review (RN Care Manager) Complete  PCP or Specialist appointment within 3-5 days of discharge Complete  HRI or Home Care Consult Complete  SW Recovery Care/Counseling Consult Not Complete  SW Consult Not Complete Comments NA  Palliative Care Screening Complete  Skilled Nursing Facility Complete

## 2021-06-27 NOTE — Progress Notes (Signed)
PROGRESS NOTE    Ackley  [id number] DOB: [birth year] DOA: 06/25/2021 PCP: Denton Lank, MD  [id number]   Assessment & Plan:   Principal Problem:   Septic shock [facility]) Active Problems:   Ureteral calculus   Hydronephrosis with urinary obstruction due to ureteral calculus   Lori Petersen [facility] is a 66 y.o. female with developmental delay, dementia, Down syndrome who came in with concerns for hypotension and tachycardia and admitted for septic shock by PCCM.    On review of records in March 2023 she had sepsis and had stent placement at that time.  There was difficulty following up due to coordination and she was hospitalized for pneumonia after that.  Pt had cystoscopy, laser lithotripsy and a stent change with Dr. Diamantina Providence the day before on 06/24/21.   Severe ACUTE Hypoxic Respiratory Failure --pt was on mech vent and extubated on 6/11.  Currently on 2L O2.  Septic shock 2/2 E coli bacteremia likely from urinary source --started on vanc/cefe/flagyl.  Was on pressor briefly. --de-escalate to ceftriaxone tomorrow  Obstructing left ureteral stone S/p left ureteral stent placement with Dr. Bernardo Heater --cont Foley for now -Cystoscopy stent removal with Dr. Bernardo Heater in 7 to 10 days  AKI --Cr 3.05 on presentation.  Improved to 1.49 today.  Acute toxic metabolic encephalopathy  --remains mostly somnolent --NPO for now.  Will attempt SLP again tomorrow.  Hx of seizure --cont home keppra as IV  Thrombocytopenia --likely due to sepsis.  Plt dropped from 91 on presentation to 47 this morning. --hold DVT ppx --monitor  Hypoglycemia --due to NPO status and having received SSI --d/c SSI --BG check q4h --D5@20  for now   DVT prophylaxis: SCD/Compression stockings Code Status: Full code  Family Communication:  Level of care: Stepdown Dispo:   The patient is from: home Anticipated d/c is to: home Anticipated d/c date is: >3 days Patient currently is not medically ready to  d/c due to: bacteremic on IV abx   Subjective and Interval History:  Pt was noted to be mostly somnolent, and therefore kept NPO.  Pt was not answering orientation questions.   Objective: Vitals:   06/27/21 1100 06/27/21 1200 06/27/21 1300 06/27/21 1700  BP: 115/66 116/70 120/71 109/70  Pulse:    75  Resp: 19 (!) 28 19 (!) 23  Temp:  98 F (36.7 C)  98.2 F (36.8 C)  TempSrc:  Oral  Oral  SpO2:  97%  97%  Weight:      Height:        Intake/Output Summary (Last 24 hours) at 06/27/2021 1838 Last data filed at 06/27/2021 1800 Gross per 24 hour  Intake 1251.45 ml  Output 925 ml  Net 326.45 ml   Filed Weights   06/25/21 1419 06/26/21 0500 06/27/21 0500  Weight: 93.1 kg 93.1 kg 93.1 kg    Examination:   Constitutional: NAD, somnolent but arousable, appeared confused HEENT: conjunctivae and lids normal, EOMI CV: No cyanosis.   RESP: normal respiratory effort, on 2L SKIN: warm, dry Foley present   Data Reviewed: I have personally reviewed following labs and imaging studies  CBC: Recent Labs  Lab 06/25/21 1119 06/26/21 0502 06/27/21 0536  WBC 52.2* 50.3* 49.9*  NEUTROABS 46.3*  --   --   HGB 9.8* 9.0* 8.8*  HCT 33.5* 28.6* 29.2*  MCV 105.7* 100.4* 102.1*  PLT 91* 59* 47*   Basic Metabolic Panel: Recent Labs  Lab 06/25/21 1119 06/25/21 2028 06/26/21 0502 06/27/21 0536  NA 140  139 141 144  K 5.5* 5.3* 4.3 4.4  CL 108 110 111 112*  CO2 22 18* 21* 26  GLUCOSE 125* 205* 249* 135*  BUN 25* 29* 29* 29*  CREATININE 3.05* 2.49* 1.89* 1.49*  CALCIUM 7.5* 7.3* 7.2* 7.3*  MG  --  1.8 1.9 1.9  PHOS  --  4.5 3.2 2.7   GFR: Estimated Creatinine Clearance: 44.9 mL/min (A) (by C-G formula based on SCr of 1.49 mg/dL (H)). Liver Function Tests: Recent Labs  Lab 06/25/21 1119  AST 84*  ALT 32  ALKPHOS 52  BILITOT 0.5  PROT 5.9*  ALBUMIN 2.5*   No results for input(s): "LIPASE", "AMYLASE" in the last 168 hours. No results for input(s): "AMMONIA" in the last  168 hours. Coagulation Profile: Recent Labs  Lab 06/25/21 1119  INR 1.4*   Cardiac Enzymes: No results for input(s): "CKTOTAL", "CKMB", "CKMBINDEX", "TROPONINI" in the last 168 hours. BNP (last 3 results) No results for input(s): "PROBNP" in the last 8760 hours. HbA1C: No results for input(s): "HGBA1C" in the last 72 hours. CBG: Recent Labs  Lab 06/27/21 0221 06/27/21 0313 06/27/21 0737 06/27/21 1140 06/27/21 1643  GLUCAP 164* 146* 159* 96 64*   Lipid Profile: No results for input(s): "CHOL", "HDL", "LDLCALC", "TRIG", "CHOLHDL", "LDLDIRECT" in the last 72 hours. Thyroid Function Tests: No results for input(s): "TSH", "T4TOTAL", "FREET4", "T3FREE", "THYROIDAB" in the last 72 hours. Anemia Panel: No results for input(s): "VITAMINB12", "FOLATE", "FERRITIN", "TIBC", "IRON", "RETICCTPCT" in the last 72 hours. Sepsis Labs: Recent Labs  Lab 06/25/21 1119 06/25/21 1335 06/25/21 2028 06/26/21 0033 06/26/21 0502 06/26/21 1340 06/27/21 0536  PROCALCITON >150.00  --   --   --  97.57  --  51.03  LATICACIDVEN 5.1*   < > 4.0* 5.8* 4.4* 2.1*  --    < > = values in this interval not displayed.    Recent Results (from the past 240 hour(s))  Resp Panel by RT-PCR (Flu A&B, Covid) Anterior Nasal Swab     Status: None   Collection Time: 06/25/21 11:19 AM   Specimen: Anterior Nasal Swab  Result Value Ref Range Status   SARS Coronavirus 2 by RT PCR NEGATIVE NEGATIVE Final    Comment: (NOTE) SARS-CoV-2 target nucleic acids are NOT DETECTED.  The SARS-CoV-2 RNA is generally detectable in upper respiratory specimens during the acute phase of infection. The lowest concentration of SARS-CoV-2 viral copies this assay can detect is 138 copies/mL. A negative result does not preclude SARS-Cov-2 infection and should not be used as the sole basis for treatment or other patient management decisions. A negative result may occur with  improper specimen collection/handling, submission of specimen  other than nasopharyngeal swab, presence of viral mutation(s) within the areas targeted by this assay, and inadequate number of viral copies(<138 copies/mL). A negative result must be combined with clinical observations, patient history, and epidemiological information. The expected result is Negative.  Fact Sheet for Patients:  EntrepreneurPulse.com.au  Fact Sheet for Healthcare Providers:  IncredibleEmployment.be  This test is no t yet approved or cleared by the [place] FDA and  has been authorized for detection and/or diagnosis of SARS-CoV-2 by FDA under an Emergency Use Authorization (EUA). This EUA will remain  in effect (meaning this test can be used) for the duration of the COVID-19 declaration under Section 564(b)(1) of the Act, 21 U.S.C.section 360bbb-3(b)(1), unless the authorization is terminated  or revoked sooner.       Influenza A by PCR NEGATIVE NEGATIVE Final  Influenza B by PCR NEGATIVE NEGATIVE Final    Comment: (NOTE) The Xpert Xpress SARS-CoV-2/FLU/RSV plus assay is intended as an aid in the diagnosis of influenza from Nasopharyngeal swab specimens and should not be used as a sole basis for treatment. Nasal washings and aspirates are unacceptable for Xpert Xpress SARS-CoV-2/FLU/RSV testing.  Fact Sheet for Patients: EntrepreneurPulse.com.au  Fact Sheet for Healthcare Providers: IncredibleEmployment.be  This test is not yet approved or cleared by the [place] FDA and has been authorized for detection and/or diagnosis of SARS-CoV-2 by FDA under an Emergency Use Authorization (EUA). This EUA will remain in effect (meaning this test can be used) for the duration of the COVID-19 declaration under Section 564(b)(1) of the Act, 21 U.S.C. section 360bbb-3(b)(1), unless the authorization is terminated or revoked.  Performed at [facility], [address]  [place], [place] [address]   Blood Culture (routine x 2)     Status: Abnormal (Preliminary result)   Collection Time: 06/25/21 11:20 AM   Specimen: BLOOD RIGHT HAND  Result Value Ref Range Status   Specimen Description   Final    BLOOD RIGHT HAND Performed at [facility], [address], [place], [place] [address]    Special Requests   Final    BOTTLES DRAWN AEROBIC AND ANAEROBIC Blood Culture results may not be optimal due to an inadequate volume of blood received in culture bottles Performed at [facility], [address] [place], [place] [address]    Culture  Setup Time   Final    IN BOTH AEROBIC AND ANAEROBIC BOTTLES GRAM NEGATIVE RODS CRITICAL RESULT CALLED TO, READ BACK BY AND VERIFIED WITH: RODNEY GRUBB 06/26/21 @ 0907 BY SB    Culture (A)  Final    ESCHERICHIA COLI SUSCEPTIBILITIES TO FOLLOW Performed at [facility], [place] [address], [place], [place] [address]    Report Status PENDING  Incomplete  Blood Culture ID Panel (Reflexed)     Status: Abnormal   Collection Time: 06/25/21 11:20 AM  Result Value Ref Range Status   Enterococcus faecalis NOT DETECTED NOT DETECTED Final   Enterococcus Faecium NOT DETECTED NOT DETECTED Final   Listeria monocytogenes NOT DETECTED NOT DETECTED Final   Staphylococcus species NOT DETECTED NOT DETECTED Final   Staphylococcus aureus (BCID) NOT DETECTED NOT DETECTED Final   Staphylococcus epidermidis NOT DETECTED NOT DETECTED Final   Staphylococcus lugdunensis NOT DETECTED NOT DETECTED Final   Streptococcus species NOT DETECTED NOT DETECTED Final   Streptococcus agalactiae NOT DETECTED NOT DETECTED Final   Streptococcus pneumoniae NOT DETECTED NOT DETECTED Final   Streptococcus pyogenes NOT DETECTED NOT DETECTED Final   A.calcoaceticus-baumannii NOT DETECTED NOT DETECTED Final   Bacteroides fragilis NOT DETECTED NOT DETECTED Final   Enterobacterales DETECTED (A) NOT DETECTED Final    Comment: Enterobacterales  represent a large order of gram negative bacteria, not a single organism. CRITICAL RESULT CALLED TO, READ BACK BY AND VERIFIED WITH: RODNEY GRUBB 06/26/21 @ 0907 BY SB    Enterobacter cloacae complex NOT DETECTED NOT DETECTED Final   Escherichia coli DETECTED (A) NOT DETECTED Final    Comment: CRITICAL RESULT CALLED TO, READ BACK BY AND VERIFIED WITH: RODNEY GRUBB 06/26/21 @ 0907 BY SB    Klebsiella aerogenes NOT DETECTED NOT DETECTED Final   Klebsiella oxytoca NOT DETECTED NOT DETECTED Final   Klebsiella pneumoniae NOT DETECTED NOT DETECTED Final   Proteus species NOT DETECTED NOT DETECTED Final   Salmonella species NOT DETECTED NOT DETECTED  Final   Serratia marcescens NOT DETECTED NOT DETECTED Final   Haemophilus influenzae NOT DETECTED NOT DETECTED Final   Neisseria meningitidis NOT DETECTED NOT DETECTED Final   Pseudomonas aeruginosa NOT DETECTED NOT DETECTED Final   Stenotrophomonas maltophilia NOT DETECTED NOT DETECTED Final   Candida albicans NOT DETECTED NOT DETECTED Final   Candida auris NOT DETECTED NOT DETECTED Final   Candida glabrata NOT DETECTED NOT DETECTED Final   Candida krusei NOT DETECTED NOT DETECTED Final   Candida parapsilosis NOT DETECTED NOT DETECTED Final   Candida tropicalis NOT DETECTED NOT DETECTED Final   Cryptococcus neoformans/gattii NOT DETECTED NOT DETECTED Final   CTX-M ESBL NOT DETECTED NOT DETECTED Final   Carbapenem resistance IMP NOT DETECTED NOT DETECTED Final   Carbapenem resistance KPC NOT DETECTED NOT DETECTED Final   Carbapenem resistance NDM NOT DETECTED NOT DETECTED Final   Carbapenem resist OXA 48 LIKE NOT DETECTED NOT DETECTED Final   Carbapenem resistance VIM NOT DETECTED NOT DETECTED Final    Comment: Performed at [facility], [address] [place], [place] [address]  Blood Culture (routine x 2)     Status: None (Preliminary result)   Collection Time: 06/25/21 11:21 AM   Specimen: BLOOD  Result Value Ref Range Status    Specimen Description BLOOD RIGHT ANTECUBITAL  Final   Special Requests   Final    BOTTLES DRAWN AEROBIC AND ANAEROBIC Blood Culture results may not be optimal due to an excessive volume of blood received in culture bottles   Culture   Final    NO GROWTH 2 DAYS Performed at [facility], [address] [place], [place] [address]    Report Status PENDING  Incomplete  Urine Culture     Status: Abnormal   Collection Time: 06/25/21  1:35 PM   Specimen: In/Out Cath Urine  Result Value Ref Range Status   Specimen Description   Final    IN/OUT CATH URINE Performed at [facility], [address] [place], [place] [address]    Special Requests   Final    NONE Performed at [facility], [address] [place], [place] [address]    Culture 2,000 COLONIES/mL YEAST (A)  Final   Report Status 06/27/2021 FINAL  Final  MRSA Next Gen by PCR, Nasal     Status: None   Collection Time: 06/25/21  2:21 PM   Specimen: Nasal Mucosa; Nasal Swab  Result Value Ref Range Status   MRSA by PCR Next Gen NOT DETECTED NOT DETECTED Final    Comment: (NOTE) The GeneXpert MRSA Assay (FDA approved for NASAL specimens only), is one component of a comprehensive MRSA colonization surveillance program. It is not intended to diagnose MRSA infection nor to guide or monitor treatment for MRSA infections. Test performance is not FDA approved in patients less than 36 years old. Performed at [facility], [address], [place], [place] [address]   Aerobic/Anaerobic Culture w Gram Stain (surgical/deep wound)     Status: None (Preliminary result)   Collection Time: 06/25/21  4:46 PM   Specimen: PATH Other; Tissue  Result Value Ref Range Status   Specimen Description   Final    URINE, RANDOM URINE FROM LEFT RENAL PELVIS Performed at [facility], [address] [place], [place] [address]    Special Requests   Final    NONE Performed at [facility], [address], [place], [place] [address]    Gram Stain  Final    RARE WBC PRESENT, PREDOMINANTLY MONONUCLEAR NO ORGANISMS SEEN    Culture   Final    NO GROWTH 1 DAY Performed at [facility] [address] [place], [place] [address]    Report Status PENDING  Incomplete      Radiology Studies: DG Chest Port 1 View  Result Date: 06/25/2021 CLINICAL DATA:  ET tube, OG tube placement EXAM: PORTABLE CHEST 1 VIEW COMPARISON:  06/25/2021 FINDINGS: Endotracheal tube is in the right mainstem bronchus. Recommend retracting approximately 3 cm. OG tube is in the stomach. Heart is normal size. Areas of atelectasis in the left base. No visible effusions. IMPRESSION: Endotracheal tube in the right mainstem bronchus. Recommend retracting endotracheal tube approximately 3 cm. Left base atelectasis. These results will be called to the ordering clinician or representative by the Radiologist Assistant, and communication documented in the PACS or [employer]. Electronically Signed   By: Rolm Baptise M.D.   On: 06/25/2021 20:25     Scheduled Meds:  chlorhexidine  15 mL Mouth Rinse BID   Chlorhexidine Gluconate Cloth  6 each Topical Q0600   ipratropium-albuterol  3 mL Nebulization Q6H   pantoprazole (PROTONIX) IV  40 mg Intravenous Q24H   Continuous Infusions:  sodium chloride     sodium chloride 50 mL/hr at 06/27/21 1400   ceFEPime (MAXIPIME) IV Stopped (06/27/21 0824)   [START ON 06/28/2021] cefTRIAXone (ROCEPHIN)  IV     dextrose 5% lactated ringers 20 mL/hr at 06/27/21 1653   levETIRAcetam 750 mg (06/27/21 1521)     LOS: 2 days     Enzo Bi, MD [facility] If 7PM-7AM, please contact night-coverage 06/27/2021, 6:38 PM

## 2021-06-27 NOTE — Progress Notes (Signed)
Dr Purcell Mouton stopped in from [place], wanting an update I gave her one and asked for any family or information we can use to speak to someone about her plan of care, she gave me Dr. Kirby Funk who is her pcp [phone]. Dr Purcell Mouton did say that pts legal guardian is her sister but she is in a nursing home as well. Updated palliative and ICU MD as well as Dr. Fran Lowes.

## 2021-06-27 NOTE — NC FL2 (Signed)
[place] LEVEL OF CARE SCREENING TOOL     IDENTIFICATION  Patient Name: Lori Petersen: October 25, 1955 Sex: female Admission Date (Current Location): 06/25/2021  [facility] and [place] Number:  Engineering geologist and Address:  [facility], [address], [place], [place] [address]      Provider Number: [id number]  Attending Physician Name and Address:  Enzo Bi, MD  Relative Name and Phone Number:  Jilda [phone]    Current Level of Care: Hospital Recommended Level of Care: [place] Prior Approval Number:    Date Approved/Denied:   PASRR Number:    Discharge Plan: SNF    Current Diagnoses: Patient Active Problem List   Diagnosis Date Noted   Hydronephrosis with urinary obstruction due to ureteral calculus    Respiratory failure ([place]) 05/18/2021   Pneumonia 05/17/2021   Dementia ([place])    HLD (hyperlipidemia)    Sepsis ([place])    Hypernatremia 03/26/2021   Thrombocytopenia ([place]) 03/26/2021   Asthma, mild intermittent 03/25/2021   Nausea & vomiting 03/25/2021   Bacteremia due to Escherichia coli [id number]   Complicated UTI (urinary tract infection) 03/25/2021   Down syndrome 03/25/2021   AKI (acute kidney injury) ([place]) 03/24/2021   Septic shock ([place])    Ureteral calculus    Seizures ([place]) 12/03/2018   Obesity, Class III, BMI 40-49.9 (morbid obesity) ([place]) 11/06/2018   Chronic diastolic CHF (congestive heart failure) ([place]) [id number]   Acute metabolic encephalopathy [id number]   Hypothyroid 11/06/2018   COVID-19 virus infection 11/03/2018   COVID-19    Acute respiratory failure with hypoxia ([place]) 11/02/2018   Left tibial fracture 04/27/2017   Right tibial fracture 04/27/2017   Problems with swallowing and mastication    Blood in stool    Bradycardia 05/14/2015   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   Onychomycosis     Orientation RESPIRATION BLADDER Height &  Weight        O2 ([place] 2L) Incontinent Weight: 93.1 kg Height:  5\' 8"  (172.7 cm)  BEHAVIORAL SYMPTOMS/MOOD NEUROLOGICAL BOWEL NUTRITION STATUS      Incontinent Diet (see discharge summary)  AMBULATORY STATUS COMMUNICATION OF NEEDS Skin   Total Care Non-Verbally Normal                       Personal Care Assistance Level of Assistance  Total care       Total Care Assistance: Maximum assistance   Functional Limitations Info  Hearing   Hearing Info: Impaired      SPECIAL CARE FACTORS FREQUENCY                       Contractures Contractures Info: Not present    Additional Factors Info  Code Status, Allergies Code Status Info: Full Allergies Info: NKA           Current Medications (06/27/2021):  This is the current hospital active medication list Current Facility-Administered Medications  Medication Dose Route Frequency Provider Last Rate Last Admin   0.9 %  sodium chloride infusion  250 mL Intravenous Continuous Stoioff, Scott C, MD       0.9 %  sodium chloride infusion   Intravenous Continuous Enzo Bi, MD 50 mL/hr at 06/27/21 1021 New Bag at 06/27/21 1021   ceFEPIme (MAXIPIME) 2 g in sodium chloride 0.9 % 100 mL IVPB  2 g Intravenous Q12H Dallie Piles, RPH 200 mL/hr at 06/27/21 0800 Infusion  Verify at 06/27/21 0800   chlorhexidine (PERIDEX) 0.12 % solution 15 mL  15 mL Mouth Rinse BID Stoioff, Scott C, MD   15 mL at 06/27/21 0751   Chlorhexidine Gluconate Cloth 2 % PADS 6 each  6 each Topical Q0600 Stoioff, Scott C, MD       docusate sodium (COLACE) capsule 100 mg  100 mg Oral BID PRN Stoioff, Scott C, MD       insulin aspart (novoLOG) injection 0-15 Units  0-15 Units Subcutaneous Q4H Rust-Chester, Britton L, NP   3 Units at 06/27/21 0751   ipratropium-albuterol (DUONEB) 0.5-2.5 (3) MG/3ML nebulizer solution 3 mL  3 mL Nebulization Q6H Stoioff, Scott C, MD   3 mL at 06/27/21 0755   ipratropium-albuterol (DUONEB) 0.5-2.5 (3) MG/3ML nebulizer solution 3 mL   3 mL Nebulization Q6H PRN Stoioff, Scott C, MD       metroNIDAZOLE (FLAGYL) IVPB 500 mg  500 mg Intravenous Q12H Darel Hong D, NP   Stopped at 06/26/21 2342   pantoprazole (PROTONIX) injection 40 mg  40 mg Intravenous Q24H Rust-Chester, Britton L, NP   40 mg at 06/26/21 2137   polyethylene glycol (MIRALAX / GLYCOLAX) packet 17 g  17 g Oral Daily PRN Stoioff, Ronda Fairly, MD         Discharge Medications: Please see discharge summary for a list of discharge medications.  Relevant Imaging Results:  Relevant Lab Results:   Additional Information [id number]  Shelbie Hutching, RN

## 2021-06-27 NOTE — Progress Notes (Signed)
Upon my first assessment this am pts pupils were noticeably different than what had previously been documented. Another nurse confirmed my findings, I notified ICU MD as well as Hospitalist MD, ICU MD ordered ABG no further orders given concerning pupil differences. Notified MD around 1600 of pt new onset seizure like activity, tremors noted across chest and both shoulders, pharmacy had restarted Keppra which I administered, prn ativan order put in system, will continue to monitor.

## 2021-06-27 NOTE — Progress Notes (Addendum)
SLP Cancellation Note  Patient Details Name: Glynnis Gavel [facility] MRN: [id number] DOB: [birth year]   Cancelled treatment:       Reason Eval/Treat Not Completed: Patient not medically ready;Medical issues which prohibited therapy.  Pt is currently lethargic, resting in bed. NSG reported poor arousal to stimulation. Upon giving MAX verbal/tactile stim including sternal rub, pt reflexively swallowed and half-opened eyes but did not look to voice. She did not follow commands to stick out tongue or lick lips.  Pt is not appropriate for BSE nor oral intake. Recommend continue to frequent oral care for hygiene and stimulation of swallowing. ST services will f/u tomorrow for ongoing assessment of pt's status. NSG agreed.        Jerilynn Som, MS, CCC-SLP Speech Language Pathologist Rehab Services; [facility] [phone] (ascom) Trayonna Bachmeier 06/27/2021, 9:57 AM

## 2021-06-28 DIAGNOSIS — R6521 Severe sepsis with septic shock: Secondary | ICD-10-CM | POA: Diagnosis not present

## 2021-06-28 DIAGNOSIS — Z7189 Other specified counseling: Secondary | ICD-10-CM

## 2021-06-28 DIAGNOSIS — A419 Sepsis, unspecified organism: Secondary | ICD-10-CM | POA: Diagnosis not present

## 2021-06-28 LAB — CBC
HCT: 30.7 % — ABNORMAL LOW (ref 36.0–46.0)
Hemoglobin: 9.1 g/dL — ABNORMAL LOW (ref 12.0–15.0)
MCH: 30.5 pg (ref 26.0–34.0)
MCHC: 29.6 g/dL — ABNORMAL LOW (ref 30.0–36.0)
MCV: 103 fL — ABNORMAL HIGH (ref 80.0–100.0)
Platelets: 53 10*3/uL — ABNORMAL LOW (ref 150–400)
RBC: 2.98 MIL/uL — ABNORMAL LOW (ref 3.87–5.11)
RDW: 18.6 % — ABNORMAL HIGH (ref 11.5–15.5)
WBC: 41.9 10*3/uL — ABNORMAL HIGH (ref 4.0–10.5)
nRBC: 0 % (ref 0.0–0.2)

## 2021-06-28 LAB — CULTURE, BLOOD (ROUTINE X 2)

## 2021-06-28 LAB — BASIC METABOLIC PANEL
Anion gap: 4 — ABNORMAL LOW (ref 5–15)
BUN: 28 mg/dL — ABNORMAL HIGH (ref 8–23)
CO2: 28 mmol/L (ref 22–32)
Calcium: 8 mg/dL — ABNORMAL LOW (ref 8.9–10.3)
Chloride: 116 mmol/L — ABNORMAL HIGH (ref 98–111)
Creatinine, Ser: 1.16 mg/dL — ABNORMAL HIGH (ref 0.44–1.00)
GFR, Estimated: 52 mL/min — ABNORMAL LOW (ref >60)
Glucose, Bld: 80 mg/dL (ref 70–99)
Potassium: 4.3 mmol/L (ref 3.5–5.1)
Sodium: 148 mmol/L — ABNORMAL HIGH (ref 135–145)

## 2021-06-28 LAB — GLUCOSE, CAPILLARY
Glucose-Capillary: 90 mg/dL (ref 70–99)
Glucose-Capillary: 102 mg/dL — ABNORMAL HIGH (ref 70–99)
Glucose-Capillary: 66 mg/dL — ABNORMAL LOW (ref 70–99)
Glucose-Capillary: 90 mg/dL (ref 70–99)
Glucose-Capillary: 76 mg/dL (ref 70–99)
Glucose-Capillary: 90 mg/dL (ref 70–99)
Glucose-Capillary: 138 mg/dL — ABNORMAL HIGH (ref 70–99)

## 2021-06-28 LAB — PHOSPHORUS: Phosphorus: 2.3 mg/dL — ABNORMAL LOW (ref 2.5–4.6)

## 2021-06-28 LAB — MAGNESIUM: Magnesium: 2.2 mg/dL (ref 1.7–2.4)

## 2021-06-28 MED ORDER — CHLORHEXIDINE GLUCONATE CLOTH 2 % EX PADS
6.0000 | MEDICATED_PAD | Freq: Every day | CUTANEOUS | Status: DC
  Administered 2021-06-28 – 2021-06-30 (×2): 6 via TOPICAL

## 2021-06-28 MED ORDER — LEVOTHYROXINE SODIUM 100 MCG PO TABS
100.0000 ug | ORAL_TABLET | Freq: Every day | ORAL | Status: DC
  Administered 2021-06-29 – 2021-06-30 (×2): 100 ug via ORAL
  Filled 2021-06-28 (×2): qty 1

## 2021-06-28 MED ORDER — MEDIHONEY WOUND/BURN DRESSING EX PSTE
1.0000 "application " | PASTE | Freq: Every day | CUTANEOUS | Status: DC
  Administered 2021-06-29 – 2021-06-30 (×2): 1 via TOPICAL
  Filled 2021-06-28: qty 44

## 2021-06-28 MED ORDER — SODIUM CHLORIDE 0.9 % IV SOLN
750.0000 mg | Freq: Once | INTRAVENOUS | Status: AC
Start: 2021-06-28 — End: 2021-06-29
  Administered 2021-06-28: 750 mg via INTRAVENOUS
  Filled 2021-06-28: qty 7.5

## 2021-06-28 MED ORDER — PANTOPRAZOLE SODIUM 40 MG PO TBEC
40.0000 mg | DELAYED_RELEASE_TABLET | Freq: Every day | ORAL | Status: DC
  Administered 2021-06-28 – 2021-06-29 (×2): 40 mg via ORAL
  Filled 2021-06-28 (×2): qty 1

## 2021-06-28 MED ORDER — CEFAZOLIN SODIUM-DEXTROSE 2-4 GM/100ML-% IV SOLN
2.0000 g | Freq: Three times a day (TID) | INTRAVENOUS | Status: DC
Start: 2021-06-29 — End: 2021-06-30
  Administered 2021-06-29 – 2021-06-30 (×4): 2 g via INTRAVENOUS
  Filled 2021-06-28 (×5): qty 100

## 2021-06-28 MED ORDER — DEXTROSE 50 % IV SOLN
12.5000 g | INTRAVENOUS | Status: AC
  Administered 2021-06-28: 12.5 g via INTRAVENOUS

## 2021-06-28 MED ORDER — LEVETIRACETAM 750 MG PO TABS: 750.0000 mg | ORAL_TABLET | Freq: Two times a day (BID) | ORAL | Status: DC

## 2021-06-28 MED ORDER — LEVETIRACETAM 750 MG PO TABS
750.0000 mg | ORAL_TABLET | Freq: Two times a day (BID) | ORAL | Status: DC
  Administered 2021-06-29 – 2021-06-30 (×3): 750 mg via ORAL
  Filled 2021-06-28 (×3): qty 1

## 2021-06-28 MED ORDER — IPRATROPIUM-ALBUTEROL 0.5-2.5 (3) MG/3ML IN SOLN
3.0000 mL | Freq: Three times a day (TID) | RESPIRATORY_TRACT | Status: DC
  Administered 2021-06-28 – 2021-06-29 (×5): 3 mL via RESPIRATORY_TRACT
  Filled 2021-06-28 (×5): qty 3

## 2021-06-28 NOTE — Evaluation (Signed)
Clinical/Bedside Swallow Evaluation Patient Details  Name: Alexandrea Clennon [facility] MRN: [id number] Date of Birth: 15-Mar-1955  Today's Date: 06/28/2021 Time: SLP Start Time (ACUTE ONLY): 1110 SLP Stop Time (ACUTE ONLY): 1210 SLP Time Calculation (min) (ACUTE ONLY): 60 min  Past Medical History:  Past Medical History:  Diagnosis Date   AKI (acute kidney injury) ([place])    Arthritis    lower spine   Asthma    Dementia ([place])    Diastolic congestive heart failure ([place])    "only has problems with bad colds"   Down's syndrome    Full dentures    does not wear   GERD (gastroesophageal reflux disease)    barrett's   Glaucoma    History of kidney stones    HLD (hyperlipidemia)    Hypothyroidism    Onychomycosis    OSA (obstructive sleep apnea)    Pneumonia 05/17/2021   Pre-diabetes    Respiratory failure (H[facility])    Seizures ([place])    Sepsis ([place]) 05/17/2021   Thrombocytopenia ([place])    UTI (urinary tract infection) 05/17/2021   Past Surgical History:  Past Surgical History:  Procedure Laterality Date   BREAST BIOPSY Left 04/05/2016   neg. cylinder clip   BREAST BIOPSY Left 12/06/2016   ribbon clip. path pending   COLONOSCOPY WITH PROPOFOL N/A 04/24/2016   Procedure: COLONOSCOPY WITH PROPOFOL;  Surgeon: Lucilla Lame, MD;  Location: [place];  Service: Endoscopy;  Laterality: N/A;  Leave patient at 10:25 due to transportation   [place], URETEROSCOPY AND STENT PLACEMENT Left 06/25/2021   Procedure: [place], URETEROSCOPY AND STENT PLACEMENT;  Surgeon: Abbie Sons, MD;  Location: [facility];  Service: Urology;  Laterality: Left;   CYSTOSCOPY WITH STENT PLACEMENT Left 03/24/2021   Procedure: CYSTOSCOPY WITH STENT PLACEMENT;  Surgeon: Billey Co, MD;  Location: [facility];  Service: Urology;  Laterality: Left;   CYSTOSCOPY/URETEROSCOPY/HOLMIUM LASER/STENT PLACEMENT Left 06/24/2021   Procedure: CYSTOSCOPY/URETEROSCOPY/HOLMIUM  LASER/STENT PLACEMENT;  Surgeon: Billey Co, MD;  Location: [facility];  Service: Urology;  Laterality: Left;   ESOPHAGOGASTRODUODENOSCOPY (EGD) WITH PROPOFOL N/A 04/24/2016   Procedure: ESOPHAGOGASTRODUODENOSCOPY (EGD) WITH PROPOFOL;  Surgeon: Lucilla Lame, MD;  Location: [place];  Service: Endoscopy;  Laterality: N/A;   HERNIA REPAIR     HPI:  Pt is a 66 y.o. female with MULTIPLE medical dxs including developmental delay w/ Down Syndrome, Dementia, GERD, Hiatal Hernia, dysphagia, Edentulous status, who comes in with concerns for hypotension and tachycardia.  Pt was seen by Urology Dr. Jeb Levering.  Appears that they did a cystoscopy and laser lithotripsy and had a stent change.  On review of records in March 2023 she had sepsis and had stent placement at that time.  There was difficulty following up due to coordination and she was hospitalized for pneumonia after that.  Patient is followed by the [facility].  Is unclear how much oxygen patient is typically on at baseline sounds like maybe 2 L as needed.  Patient was on 2 L noted to have some slightly low oxygen levels so patient increased to 4 L with EMS.  There was concern for patient's low blood pressure therefore patient was transferred to the emergency room with sepsis alert post procedure.  Pt admitted to the CCU w/ dx of Acute toxic metabolic encephalopathy acidosis.   CXR on 06/25/21: No acute cardiopulmonary disease.    Assessment / Plan / Recommendation  Clinical Impression   Pt seen today for  BSE. She appeared more alert than at yesterday's attempt though requiring MOD+ cues at times; she was engaged in po tasks w/ this Clinician w/ cues. Though mostly nonverbal (as at baseline), she stated "yes" x1 when offered po trials. She required full positioning and feeding support but again, looked to, and was orally "ready" w/ mouth open for, po trials.  Pt is known to this service and was recently assessed in 05/2021. Pt exhibited Oral phase  dysphagia during the recent MBSS w/ Impulsive drinking behaviors. NO aspiration was noted during the study. Suspect the Oral phase dysphagia and need for Supervision during oral intake is in the setting of declined Cognitive status; Baseline Down Syndrome and Dementia. This can impact her overall awareness/timing of swallow and safety during po tasks which increases risk for aspiration, choking.   Pt's diet was modified to a Puree consistency diet during the last admit d/t overall presentation, medical and Cognitive status', and Edentulous status for safer oral intake. Pt was then discharged back to her SNF w/ the [facility] following.    Currently, at this BSE, pt appeared to exhibit a similar oropharyngeal phase swallow presentation as at recent admit in 05/2021. No overt clinical s/s of aspiration noted during oral intake; oral phase impacted by Edentulous status. Pt's risk for aspiration is present d/t overall medical and Cognitive status', need for feeding and 100% Supervision, and Edentulous status, but this can be reduced by following a modified diet consistency of a Dysphagia level 1 w/ Nectar liquids, full feeding support, and aspiration precautions.   This Clinician assisted pt w/ feeding of Puree and Nectar liquids via TSP to limit volume at one time. No overt, clinical s/s of aspiration noted w/ consistencies. Noted no significant change in her respiratory presentation during/post trials -- Rest Breaks given b/t trials to support any WOB w/ the energy of po tasks required; No cough noted. O2 sats remained ~94-97%. Oral phase was c/b grossly adequate bolus management and control for A-P transfer and oral clearing of the puree/Nectar boluses. Visual and verbal cues given; pt often opened mouth spontaneously to presentation of spoon. Full feeding support given d/t Cognitive status. No unilateral oral weakness noted; slightly open-mouth posture at rest.    D/t pt's Baseline of declined Cognitive  status/Dementia, Edentulous status, acute medical illness, and risk for aspiration, recommend a dysphagia level 1(PUREE) w/ Nectar liquids diet; aspiration and REFLUX precautions; reduce Distractions during meals and engage pt during meal. Offer Nectar liquids via TSP to limit volume as needed during drinking d/t impulsive drinking behavior Baseline. Pills Crushed in Puree for safer swallowing as able. Support w/ feeding at meals. Give REST BREAKS during meals monitoring respiratory status as needed. MD/NSG updated.    ST services recommends follow w/ Palliative Care for [place] and education re: impact of Cognitive decline/Dementia and other chronic issues on swallowing.  ST services will f/u while admitted to determine appropriateness of diet upgrade; f/u at discharge at her SNF would be appropriate for further education w/ Staff/diet upgrade as appropriate -- pt appears at/close to her Baseline from a dysphagia standpoint per her recent admit/evaluation. Precautions posted in room   Recommended further management of GERD w/ a PPI and REFLUX precautions at the SNF; especially NOT to lie down post meals and to eat frequent, small meals. F/u w/ GI for further assessment, management of Reflux can be sought. Pt does have a dx of Hiatal Hernia per chart notes also. ANY Dysmotility or Regurgitation of Reflux material  can increase risk for aspiration of the Reflux material during Retrograde flow thus impact Pulmonary status. SLP Visit Diagnosis: Dysphagia, oropharyngeal phase (R13.12) (baseline dysphagia w/ Dementia and Developmental Delay; Edentulous and GERD)    Aspiration Risk  Mild aspiration risk;Risk for inadequate nutrition/hydration    Diet Recommendation   Dysphagia level 1(PUREE) w/ Nectar liquids diet; aspiration and REFLUX precautions; reduce Distractions during meals and engage pt during meals. Offer Nectar liquids via TSP to limit volume currently d/t impulsive drinking behavior Baseline. Support w/  feeding at meals. Give REST BREAKS during meals monitoring respiratory status as needed. Medication Administration: Crushed with puree (as able)    Other  Recommendations Recommended Consults:  (Dietician f/u) Oral Care Recommendations: Oral care BID;Oral care before and after PO;Staff/trained caregiver to provide oral care Other Recommendations: Order thickener from pharmacy;Prohibited food (jello, ice cream, thin soups);Remove water pitcher;Have oral suction available    Recommendations for follow up therapy are one component of a multi-disciplinary discharge planning process, led by the attending physician.  Recommendations may be updated based on patient status, additional functional criteria and insurance authorization.  Follow up Recommendations Skilled nursing-short term rehab (<3 hours/day) (TBD need)      Assistance Recommended at Discharge Frequent or constant Supervision/Assistance  Functional Status Assessment Patient has had a recent decline in their functional status and demonstrates the ability to make significant improvements in function in a reasonable and predictable amount of time.  Frequency and Duration min 2x/week  2 weeks       Prognosis Prognosis for Safe Diet Advancement: Guarded (-Fair) Barriers to Reach Goals: Cognitive deficits;Language deficits;Time post onset;Severity of deficits;Behavior Barriers/Prognosis Comment: impulsive drinking behavior at previous evaluations; requires full Supervision      Swallow Study   General Date of Onset: 06/25/21 HPI: Pt is a 66 y.o. female with MULTIPLE medical dxs including developmental delay w/ Down Syndrome, Dementia, GERD, who comes in with concerns for hypotension and tachycardia.  Pt was seen by Urology Dr. Jeb Levering.  Appears that they did a cystoscopy and laser lithotripsy and had a stent change.  On review of records in March 2023 she had sepsis and had stent placement at that time.  There was difficulty following up  due to coordination and she was hospitalized for pneumonia after that.  Patient is followed by the [facility].  Is unclear how much oxygen patient is typically on at baseline sounds like maybe 2 L as needed.  Patient was on 2 L noted to have some slightly low oxygen levels so patient increased to 4 L with EMS.  There was concern for patient's low blood pressure therefore patient was transferred to the emergency room with sepsis alert post procedure.  Pt admitted to the CCU w/ dx of Acute toxic metabolic encephalopathy acidosis.   CXR on 06/25/21: No acute cardiopulmonary disease. Type of Study: Bedside Swallow Evaluation Previous Swallow Assessment: 2018; 2020; 03/2021; 05/2021 w/ MBSS that admit w/ recommendation for mech soft, thin liquids diet w/ supervision d/t impulsive eating/drinking behavior and aspiration precautions Diet Prior to this Study: NPO Temperature Spikes Noted: No Respiratory Status: Nasal cannula (2L) History of Recent Intubation: Yes Length of Intubations (days): 1 days Date extubated: 06/25/21 Behavior/Cognition: Alert;Cooperative;Pleasant mood;Confused;Distractible;Requires cueing (baseline developmental delay; baseline Dementia) Oral Cavity Assessment: Dry Oral Care Completed by SLP: Yes Oral Cavity - Dentition: Edentulous Vision:  (n/a) Self-Feeding Abilities: Total assist Patient Positioning: Upright in bed (needed full positioning support) Baseline Vocal Quality:  (stated "yes" 1x) Volitional Cough: Cognitively  unable to elicit Volitional Swallow: Unable to elicit    Oral/Motor/Sensory Function Overall Oral Motor/Sensory Function:  (open-mouth posture slightly; no unilateral lingual/labial weakness noted)   Ice Chips Ice chips: Within functional limits Presentation: Spoon (fed; 5 trials) Other Comments: smacking   Thin Liquid Thin Liquid: Not tested    Nectar Thick Nectar Thick Liquid: Within functional limits Presentation: Spoon (fed; ~4 ozs)   Honey Thick  Honey Thick Liquid: Not tested   Puree Puree: Within functional limits Presentation: Spoon (fed; 4 ozs)   Solid     Solid: Not tested           Orinda Kenner, MS, CCC-SLP Speech Language Pathologist Rehab Services; [place] [phone] (ascom) Gwynne Kemnitz 06/28/2021,2:38 PM

## 2021-06-28 NOTE — Progress Notes (Signed)
PROGRESS NOTE    Samyra Limb [facility]  UKG:[id number] DOB: [birth year] DOA: 06/25/2021 PCP: Hillery Aldo, MD  [phone]   Assessment & Plan:   Principal Problem:   Septic shock [facility]) Active Problems:   Ureteral calculus   Hydronephrosis with urinary obstruction due to ureteral calculus   Brylee Berk [facility] is a 66 y.o. female with developmental delay, dementia, Down syndrome, seizure disorder who presented from LTC SNF with concerns for hypotension and tachycardia and admitted for septic shock by PCCM.    On review of records in March 2023 she had sepsis and had stent placement at that time.  There was difficulty following up due to coordination and she was hospitalized for pneumonia after that.  Pt had cystoscopy, laser lithotripsy and a stent change with Dr. Richardo Hanks the day before on 06/24/21.   Acute on chronic hypoxemic respiratory failure On 2L O2 at night at baseline --pt was on mech vent and extubated on 6/11.  Currently on 2L O2.  Septic shock 2/2 E coli bacteremia likely from urinary source --Was on pressor briefly.  started on vanc/cefe/flagyl, then de-escalated to ceftriaxone --de-esclate to Ancef tomorrow, per cx sensitivities  Obstructing left ureteral stone S/p left ureteral stent placement with Dr. Lonna Cobb on 06/25/21 --d/c foley today -Cystoscopy stent removal with Dr. Lonna Cobb in 7 to 10 days  AKI --Cr 3.05 on presentation.  Improved to 1.16 today.  Acute toxic metabolic encephalopathy  --Per PCP, at baseline pt is awake and can answer simple questions with yes/no.   --pt was somnolent after extubation, but became alert and awake today, started oral intake.  Hx of seizure --cont home keppra   Thrombocytopenia --likely due to sepsis.  Plt dropped from 91 on presentation to 47. --hold DVT ppx --monitor  Hypoglycemia --due to NPO status and having received SSI --BG check q4h --cont D5@30  for now until oral intake more consistent and no more BG drops   DVT  prophylaxis: SCD/Compression stockings Code Status: Full code  Family Communication: updated PACE Dr. Arta Silence on the phone today Level of care: Med-Surg Dispo:   The patient is from: LTC SNF Anticipated d/c is to: LTC SNF Anticipated d/c date is: >3 days Patient currently is not medically ready to d/c due to: bacteremic on IV abx   Subjective and Interval History:  Pt was noted to be mostly somnolent, and therefore kept NPO.  Pt was not answering orientation questions.   Objective: Vitals:   06/28/21 1300 06/28/21 1400 06/28/21 1414 06/28/21 1540  BP: 126/75 (!) 144/68 116/73 (!) 101/57  Pulse: 84 (!) 102 95 96  Resp: (!) 22 (!) 21 (!) 22 20  Temp: 98.1 F (36.7 C)  98 F (36.7 C) 99 F (37.2 C)  TempSrc: Axillary  Axillary Axillary  SpO2: 100% 96% 100% (!) 89%  Weight:      Height:        Intake/Output Summary (Last 24 hours) at 06/28/2021 1759 Last data filed at 06/28/2021 1500 Gross per 24 hour  Intake 1386.13 ml  Output 830 ml  Net 556.13 ml   Filed Weights   06/26/21 0500 06/27/21 0500 06/28/21 0500  Weight: 93.1 kg 93.1 kg 92.4 kg    Examination:   Constitutional: NAD, somnolent but arousable, appeared confused HEENT: conjunctivae and lids normal, EOMI CV: No cyanosis.   RESP: normal respiratory effort, on 2L SKIN: warm, dry Foley present   Data Reviewed: I have personally reviewed following labs and imaging studies  CBC: Recent Labs  Lab 06/25/21 1119 06/26/21 0502 06/27/21 0536 06/28/21 0342  WBC 52.2* 50.3* 49.9* 41.9*  NEUTROABS 46.3*  --   --   --   HGB 9.8* 9.0* 8.8* 9.1*  HCT 33.5* 28.6* 29.2* 30.7*  MCV 105.7* 100.4* 102.1* 103.0*  PLT 91* 59* 47* 53*   Basic Metabolic Panel: Recent Labs  Lab 06/25/21 1119 06/25/21 2028 06/26/21 0502 06/27/21 0536 06/28/21 0342  NA 140 139 141 144 148*  K 5.5* 5.3* 4.3 4.4 4.3  CL 108 110 111 112* 116*  CO2 22 18* 21* 26 28  GLUCOSE 125* 205* 249* 135* 80  BUN 25* 29* 29* 29* 28*  CREATININE  3.05* 2.49* 1.89* 1.49* 1.16*  CALCIUM 7.5* 7.3* 7.2* 7.3* 8.0*  MG  --  1.8 1.9 1.9 2.2  PHOS  --  4.5 3.2 2.7 2.3*   GFR: Estimated Creatinine Clearance: 57.5 mL/min (A) (by C-G formula based on SCr of 1.16 mg/dL (H)). Liver Function Tests: Recent Labs  Lab 06/25/21 1119  AST 84*  ALT 32  ALKPHOS 52  BILITOT 0.5  PROT 5.9*  ALBUMIN 2.5*   No results for input(s): "LIPASE", "AMYLASE" in the last 168 hours. No results for input(s): "AMMONIA" in the last 168 hours. Coagulation Profile: Recent Labs  Lab 06/25/21 1119  INR 1.4*   Cardiac Enzymes: No results for input(s): "CKTOTAL", "CKMB", "CKMBINDEX", "TROPONINI" in the last 168 hours. BNP (last 3 results) No results for input(s): "PROBNP" in the last 8760 hours. HbA1C: No results for input(s): "HGBA1C" in the last 72 hours. CBG: Recent Labs  Lab 06/28/21 0358 06/28/21 0421 06/28/21 0728 06/28/21 1110 06/28/21 1626  GLUCAP 66* 102* 90 76 90   Lipid Profile: No results for input(s): "CHOL", "HDL", "LDLCALC", "TRIG", "CHOLHDL", "LDLDIRECT" in the last 72 hours. Thyroid Function Tests: No results for input(s): "TSH", "T4TOTAL", "FREET4", "T3FREE", "THYROIDAB" in the last 72 hours. Anemia Panel: No results for input(s): "VITAMINB12", "FOLATE", "FERRITIN", "TIBC", "IRON", "RETICCTPCT" in the last 72 hours. Sepsis Labs: Recent Labs  Lab 06/25/21 1119 06/25/21 1335 06/25/21 2028 06/26/21 0033 06/26/21 0502 06/26/21 1340 06/27/21 0536  PROCALCITON >150.00  --   --   --  97.57  --  51.03  LATICACIDVEN 5.1*   < > 4.0* 5.8* 4.4* 2.1*  --    < > = values in this interval not displayed.    Recent Results (from the past 240 hour(s))  Resp Panel by RT-PCR (Flu A&B, Covid) Anterior Nasal Swab     Status: None   Collection Time: 06/25/21 11:19 AM   Specimen: Anterior Nasal Swab  Result Value Ref Range Status   SARS Coronavirus 2 by RT PCR NEGATIVE NEGATIVE Final    Comment: (NOTE) SARS-CoV-2 target nucleic acids are  NOT DETECTED.  The SARS-CoV-2 RNA is generally detectable in upper respiratory specimens during the acute phase of infection. The lowest concentration of SARS-CoV-2 viral copies this assay can detect is 138 copies/mL. A negative result does not preclude SARS-Cov-2 infection and should not be used as the sole basis for treatment or other patient management decisions. A negative result may occur with  improper specimen collection/handling, submission of specimen other than nasopharyngeal swab, presence of viral mutation(s) within the areas targeted by this assay, and inadequate number of viral copies(<138 copies/mL). A negative result must be combined with clinical observations, patient history, and epidemiological information. The expected result is Negative.  Fact Sheet for Patients:  BloggerCourse.com  Fact Sheet for Healthcare Providers:  SeriousBroker.it  This  test is no t yet approved or cleared by the [place] and  has been authorized for detection and/or diagnosis of SARS-CoV-2 by FDA under an Emergency Use Authorization (EUA). This EUA will remain  in effect (meaning this test can be used) for the duration of the COVID-19 declaration under Section 564(b)(1) of the Act, 21 U.S.C.section 360bbb-3(b)(1), unless the authorization is terminated  or revoked sooner.       Influenza A by PCR NEGATIVE NEGATIVE Final   Influenza B by PCR NEGATIVE NEGATIVE Final    Comment: (NOTE) The Xpert Xpress SARS-CoV-2/FLU/RSV plus assay is intended as an aid in the diagnosis of influenza from Nasopharyngeal swab specimens and should not be used as a sole basis for treatment. Nasal washings and aspirates are unacceptable for Xpert Xpress SARS-CoV-2/FLU/RSV testing.  Fact Sheet for Patients: BloggerCourse.com  Fact Sheet for Healthcare Providers: SeriousBroker.it  This test is not  yet approved or cleared by the [place] FDA and has been authorized for detection and/or diagnosis of SARS-CoV-2 by FDA under an Emergency Use Authorization (EUA). This EUA will remain in effect (meaning this test can be used) for the duration of the COVID-19 declaration under Section 564(b)(1) of the Act, 21 U.S.C. section 360bbb-3(b)(1), unless the authorization is terminated or revoked.  Performed at [facility], [address] [place], [place] [address]   Blood Culture (routine x 2)     Status: Abnormal   Collection Time: 06/25/21 11:20 AM   Specimen: BLOOD RIGHT HAND  Result Value Ref Range Status   Specimen Description   Final    BLOOD RIGHT HAND Performed at [facility], [address] [place], [place] [address]    Special Requests   Final    BOTTLES DRAWN AEROBIC AND ANAEROBIC Blood Culture results may not be optimal due to an inadequate volume of blood received in culture bottles Performed at [facility], [address] [place], [place] [address]    Culture  Setup Time   Final    IN BOTH AEROBIC AND ANAEROBIC BOTTLES GRAM NEGATIVE RODS CRITICAL RESULT CALLED TO, READ BACK BY AND VERIFIED WITH: RODNEY GRUBB 06/26/21 @ 0907 BY SB    Culture ESCHERICHIA COLI (A)  Final   Report Status 06/28/2021 FINAL  Final   Organism ID, Bacteria ESCHERICHIA COLI  Final      Susceptibility   Escherichia coli - MIC*    AMPICILLIN >=32 RESISTANT Resistant     CEFAZOLIN <=4 SENSITIVE Sensitive     CEFEPIME <=0.12 SENSITIVE Sensitive     CEFTAZIDIME <=1 SENSITIVE Sensitive     CEFTRIAXONE <=0.25 SENSITIVE Sensitive     CIPROFLOXACIN >=4 RESISTANT Resistant     GENTAMICIN <=1 SENSITIVE Sensitive     IMIPENEM <=0.25 SENSITIVE Sensitive     TRIMETH/SULFA <=20 SENSITIVE Sensitive     AMPICILLIN/SULBACTAM 8 SENSITIVE Sensitive     PIP/TAZO <=4 SENSITIVE Sensitive     * ESCHERICHIA COLI  Blood Culture ID Panel (Reflexed)     Status: Abnormal    Collection Time: 06/25/21 11:20 AM  Result Value Ref Range Status   Enterococcus faecalis NOT DETECTED NOT DETECTED Final   Enterococcus Faecium NOT DETECTED NOT DETECTED Final   Listeria monocytogenes NOT DETECTED NOT DETECTED Final   Staphylococcus species NOT DETECTED NOT DETECTED Final   Staphylococcus aureus (BCID) NOT DETECTED NOT DETECTED Final   Staphylococcus epidermidis NOT DETECTED NOT DETECTED Final   Staphylococcus lugdunensis NOT DETECTED NOT DETECTED Final  Streptococcus species NOT DETECTED NOT DETECTED Final   Streptococcus agalactiae NOT DETECTED NOT DETECTED Final   Streptococcus pneumoniae NOT DETECTED NOT DETECTED Final   Streptococcus pyogenes NOT DETECTED NOT DETECTED Final   A.calcoaceticus-baumannii NOT DETECTED NOT DETECTED Final   Bacteroides fragilis NOT DETECTED NOT DETECTED Final   Enterobacterales DETECTED (A) NOT DETECTED Final    Comment: Enterobacterales represent a large order of gram negative bacteria, not a single organism. CRITICAL RESULT CALLED TO, READ BACK BY AND VERIFIED WITH: RODNEY GRUBB 06/26/21 @ 0907 BY SB    Enterobacter cloacae complex NOT DETECTED NOT DETECTED Final   Escherichia coli DETECTED (A) NOT DETECTED Final    Comment: CRITICAL RESULT CALLED TO, READ BACK BY AND VERIFIED WITH: RODNEY GRUBB 06/26/21 @ 0907 BY SB    Klebsiella aerogenes NOT DETECTED NOT DETECTED Final   Klebsiella oxytoca NOT DETECTED NOT DETECTED Final   Klebsiella pneumoniae NOT DETECTED NOT DETECTED Final   Proteus species NOT DETECTED NOT DETECTED Final   Salmonella species NOT DETECTED NOT DETECTED Final   Serratia marcescens NOT DETECTED NOT DETECTED Final   Haemophilus influenzae NOT DETECTED NOT DETECTED Final   Neisseria meningitidis NOT DETECTED NOT DETECTED Final   Pseudomonas aeruginosa NOT DETECTED NOT DETECTED Final   Stenotrophomonas maltophilia NOT DETECTED NOT DETECTED Final   Candida albicans NOT DETECTED NOT DETECTED Final   Candida auris  NOT DETECTED NOT DETECTED Final   Candida glabrata NOT DETECTED NOT DETECTED Final   Candida krusei NOT DETECTED NOT DETECTED Final   Candida parapsilosis NOT DETECTED NOT DETECTED Final   Candida tropicalis NOT DETECTED NOT DETECTED Final   Cryptococcus neoformans/gattii NOT DETECTED NOT DETECTED Final   CTX-M ESBL NOT DETECTED NOT DETECTED Final   Carbapenem resistance IMP NOT DETECTED NOT DETECTED Final   Carbapenem resistance KPC NOT DETECTED NOT DETECTED Final   Carbapenem resistance NDM NOT DETECTED NOT DETECTED Final   Carbapenem resist OXA 48 LIKE NOT DETECTED NOT DETECTED Final   Carbapenem resistance VIM NOT DETECTED NOT DETECTED Final    Comment: Performed at [facility], [address] [place], [place] [address]  Blood Culture (routine x 2)     Status: None (Preliminary result)   Collection Time: 06/25/21 11:21 AM   Specimen: BLOOD  Result Value Ref Range Status   Specimen Description BLOOD RIGHT ANTECUBITAL  Final   Special Requests   Final    BOTTLES DRAWN AEROBIC AND ANAEROBIC Blood Culture results may not be optimal due to an excessive volume of blood received in culture bottles   Culture   Final    NO GROWTH 3 DAYS Performed at [facility], [address] [place], [place] [address]    Report Status PENDING  Incomplete  Urine Culture     Status: Abnormal   Collection Time: 06/25/21  1:35 PM   Specimen: In/Out Cath Urine  Result Value Ref Range Status   Specimen Description   Final    IN/OUT CATH URINE Performed at [facility], [address], [place], [place] [address]    Special Requests   Final    NONE Performed at [facility], [address] [place], [place] [address]    Culture 2,000 COLONIES/mL YEAST (A)  Final   Report Status 06/27/2021 FINAL  Final  MRSA Next Gen by PCR, Nasal     Status: None   Collection Time: 06/25/21  2:21 PM   Specimen: Nasal Mucosa; Nasal Swab  Result Value Ref  Range Status   MRSA  by PCR Next Gen NOT DETECTED NOT DETECTED Final    Comment: (NOTE) The GeneXpert MRSA Assay (FDA approved for NASAL specimens only), is one component of a comprehensive MRSA colonization surveillance program. It is not intended to diagnose MRSA infection nor to guide or monitor treatment for MRSA infections. Test performance is not FDA approved in patients less than 65 years old. Performed at [facility], [address] [place], [place] [address]   Aerobic/Anaerobic Culture w Gram Stain (surgical/deep wound)     Status: None (Preliminary result)   Collection Time: 06/25/21  4:46 PM   Specimen: PATH Other; Tissue  Result Value Ref Range Status   Specimen Description   Final    URINE, RANDOM URINE FROM LEFT RENAL PELVIS Performed at [facility], [address] [place], [place] [address]    Special Requests   Final    NONE Performed at [facility], [address] [place], [place] [address]    Gram Stain   Final    RARE WBC PRESENT, PREDOMINANTLY MONONUCLEAR NO ORGANISMS SEEN    Culture   Final    NO GROWTH 2 DAYS NO ANAEROBES ISOLATED; CULTURE IN PROGRESS FOR 5 DAYS Performed at [facility], [address] [place], [place] [address]    Report Status PENDING  Incomplete      Radiology Studies: No results found.   Scheduled Meds:  chlorhexidine  15 mL Mouth Rinse BID   Chlorhexidine Gluconate Cloth  6 each Topical Daily   ipratropium-albuterol  3 mL Nebulization TID   leptospermum manuka honey  1 application  Topical Daily   [START ON 06/29/2021] levETIRAcetam  750 mg Oral BID   [START ON 06/29/2021] levothyroxine  100 mcg Oral QAC breakfast   pantoprazole  40 mg Oral QHS   Continuous Infusions:  sodium chloride     [START ON 06/29/2021]  ceFAZolin (ANCEF) IV     dextrose 5% lactated ringers 30 mL/hr at 06/28/21 1600   levETIRAcetam       LOS: 3 days     Darlin Priestlyina Zaedyn Covin, MD [facility] If 7PM-7AM, please contact  night-coverage 06/28/2021, 5:59 PM

## 2021-06-28 NOTE — Progress Notes (Signed)
PHARMACIST - PHYSICIAN COMMUNICATION  CONCERNING: IV to Oral Route Change Policy  RECOMMENDATION: This patient is receiving pantoprazole, levetiracetam by the intravenous route.  Based on criteria approved by the Pharmacy and Therapeutics Committee, the intravenous medication(s) is/are being converted to the equivalent oral dose form(s).   DESCRIPTION: These criteria include: The patient is eating (either orally or via tube) and/or has been taking other orally administered medications for a least 24 hours The patient has no evidence of active gastrointestinal bleeding or impaired GI absorption (gastrectomy, short bowel, patient on TNA or NPO).  If you have questions about this conversion, please contact the Pharmacy Department   Tressie Ellis, [facility] 06/28/2021 1:41 PM

## 2021-06-28 NOTE — Consult Note (Cosign Needed)
Consultation Note Date: 06/28/2021   [facility]  DOB: Sep 03, 1955  MRN: [id number]  Age / Sex: 66 y.o., female  PCP: Hillery AldoPatel, Sarah, MD Referring Physician: Darlin PriestlyLai, Tina, MD  Reason for Consultation: Establishing goals of care  HPI/Patient Profile: [facility] 66 [facility] female with developmental delay, dementia, Down syndrome who came in with concerns for hypotension and tachycardia and admitted for septic shock by PCCM.  Clinical Assessment and Goals of Care:  Notes and labs reviewed from current and previous admission. Patient resting in bed. She does not speak. No family at bedside.  Spoke with sister who is HPOA. She advises she is currently in a nursing facility. She states she is being followed by a psychiatrist as she wanted to stop dialysis and after missing a couple of sessions was brought to the hospital.   She is aware of patient's hospitalizations and diagnoses. She states she wants to keep her and their other sister alive as long as she can, and at least until she herself dies. Discussed QOL. Discussed pain and suffering. Discussed she and her sister discussing care moving forward. PMT will follow.     SUMMARY OF RECOMMENDATIONS    PMT will follow. Full code/full scope at this time.          Primary Diagnoses: Present on Admission:  Septic shock (HCC)   I have reviewed the medical record, interviewed the patient and family, and examined the patient. The following aspects are pertinent.  Past Medical History:  Diagnosis Date   AKI (acute kidney injury) (HCC)    Arthritis    lower spine   Asthma    Dementia (HCC)    Diastolic congestive heart failure (HCC)    "only has problems with bad colds"   Down's syndrome    Full dentures    does not wear   GERD (gastroesophageal reflux disease)    barrett's   Glaucoma    History of kidney stones    HLD  (hyperlipidemia)    Hypothyroidism    Onychomycosis    OSA (obstructive sleep apnea)    Pneumonia 05/17/2021   Pre-diabetes    Respiratory failure (HCC)    Seizures (HCC)    Sepsis (HCC) 05/17/2021   Thrombocytopenia (HCC)    UTI (urinary tract infection) 05/17/2021   Social History   Socioeconomic History   Marital status: Single    Spouse name: Not on file   Number of children: Not on file   Years of education: Not on file   Highest education level: Not on file  Occupational History   Not on file  Tobacco Use   Smoking status: Never   Smokeless tobacco: Never  Vaping Use   Vaping Use: Never used  Substance and Sexual Activity   Alcohol use: No   Drug use: Not Currently   Sexual activity: Not Currently  Other Topics Concern   Not on file  Social History Narrative   Not on file   Social Determinants of  Health   Financial Resource Strain: Not on file  Food Insecurity: Not on file  Transportation Needs: Not on file  Physical Activity: Not on file  Stress: Not on file  Social Connections: Not on file   No family history on file. Scheduled Meds:  chlorhexidine  15 mL Mouth Rinse BID   Chlorhexidine Gluconate Cloth  6 each Topical Daily   ipratropium-albuterol  3 mL Nebulization TID   leptospermum manuka honey  1 application  Topical Daily   [START ON 06/29/2021] levETIRAcetam  750 mg Oral BID   pantoprazole  40 mg Oral QHS   Continuous Infusions:  sodium chloride     cefTRIAXone (ROCEPHIN)  IV Stopped (06/28/21 1126)   dextrose 5% lactated ringers 30 mL/hr at 06/28/21 1300   levETIRAcetam     PRN Meds:.docusate sodium, ipratropium-albuterol, LORazepam, polyethylene glycol Medications Prior to Admission:  Prior to Admission medications   Medication Sig Start Date End Date Taking? Authorizing Provider  aspirin EC 81 MG tablet Take 81 mg by mouth daily.   Yes [provider]  budesonide-formoterol (SYMBICORT) 80-4.5 MCG/ACT inhaler Inhale 2 puffs into  the lungs 2 (two) times daily. With spacer device   Yes [provider]  cephALEXin (KEFLEX) 500 MG capsule Take 1 capsule (500 mg total) by mouth daily. 06/24/21  Yes Sondra Come, MD  cholecalciferol (VITAMIN D3) 25 MCG (1000 UT) tablet Take 2,000 Units by mouth daily.   Yes [provider]  DESITIN 13 % CREA Apply 1 application. topically in the morning and at bedtime. 03/29/21  Yes [provider]  empagliflozin (JARDIANCE) 10 MG TABS tablet Take 10 mg by mouth daily.   Yes [provider]  latanoprost (XALATAN) 0.005 % ophthalmic solution Place 1 drop into the left eye at bedtime.   Yes [provider]  levETIRAcetam (KEPPRA) 250 MG tablet Take 250 mg by mouth 2 (two) times daily. (Take with 500mg  tablet to equal 750mg  total)   Yes [provider]  levETIRAcetam (KEPPRA) 500 MG tablet Take 500 mg by mouth 2 (two) times daily. (Take with 250mg  tablet to equal 750mg  total)   Yes [provider]  levothyroxine (SYNTHROID) 100 MCG tablet Take 100 mcg by mouth daily before breakfast.   Yes [provider]  omeprazole (PRILOSEC) 20 MG capsule Take 20 mg by mouth daily.   Yes [provider]  pravastatin (PRAVACHOL) 40 MG tablet Take 40 mg by mouth at bedtime.   Yes [provider]  senna (SENOKOT) 8.6 MG TABS tablet Take 2 tablets by mouth in the morning and at bedtime.   Yes [provider]  vitamin B-12 (CYANOCOBALAMIN) 1000 MCG tablet Take 1,000 mcg by mouth daily.   Yes [provider]  Zinc Oxide 13 % CREA Apply 1 application. topically 3 (three) times daily as needed (skin protection). Apply to gluteal area 3X daily with toileting - to protect skin   Yes [provider]  albuterol (PROVENTIL) (2.5 MG/3ML) 0.083% nebulizer solution Take 2.5 mg by nebulization every 4 (four) hours as needed for wheezing or shortness of breath.    [provider]  midazolam (VERSED) 5 MG/5ML  SOLN injection Inject 2 mg into the muscle See admin instructions. Per MAR: Inject 22ml intramuscularly single dose as needed for seizure lasting longer than 5 minutes.    [provider]  predniSONE (DELTASONE) 20 MG tablet Take prednisone 30 mg daily for 3 days then prednisone 20 mg daily for 3  days then prednisone 10 mg daily for 3 days and discontinue. Patient not taking: Reported on 06/25/2021 05/30/21   Cipriano Bunker, MD   No Known Allergies Review of Systems  Unable to perform ROS   Physical Exam Constitutional:      Comments: Eyes closed.   Pulmonary:     Effort: Pulmonary effort is normal.     Vital Signs: BP (!) 144/68   Pulse (!) 102   Temp 98.1 F (36.7 C) (Axillary)   Resp (!) 21   Ht 5\' 8"  (1.727 m)   Wt 92.4 kg   SpO2 96%   BMI 30.97 kg/m  Pain Scale: PAINAD       SpO2: SpO2: 96 % O2 Device:SpO2: 96 % O2 Flow Rate: .O2 Flow Rate (L/min): 2 L/min  IO: Intake/output summary:  Intake/Output Summary (Last 24 hours) at 06/28/2021 1404 Last data filed at 06/28/2021 1340 Gross per 24 hour  Intake 1386.13 ml  Output 730 ml  Net 656.13 ml    LBM: Last BM Date : 06/27/21 Baseline Weight: Weight: 88.9 kg Most recent weight: Weight: 92.4 kg        Signed by: 08/27/21, NP   Please contact Palliative Medicine Team phone at [phone] for questions and concerns.  For individual provider: See [phone]

## 2021-06-28 NOTE — Progress Notes (Signed)
Pt alert, VS stable, 2L [place]. Report was given to Comfort, RN. Pt transferred off the unit to 228. No complications noted

## 2021-06-28 NOTE — Consult Note (Addendum)
[place] Nurse Re-consult Note: Reason for Consult: Refer to previous [place] consult performed on 6/11.  Bedside nurse states wounds have declined since previous assessment.  Wound type: Bilat buttocks was noted to have shear injuries, present on admission.  They have evolved into dark red-purple Deep tissue pressure injuries; affected area is approx 6X6cm.  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  Appearance and color is consistent with pressure injuries which occurred prior to admission. No topical treatment indicated for this location, but Deep tissue pressure injuries are high risk to evolve into full thickness tissue loss within 7-10 days.  Pressure Injury POA: Yes Dressing procedure/placement/frequency: Pt is on an air mattress to reduce pressure.  They are critically ill with multiple systemic factors which can impair healing. Discontinue porevious orders for silver hydrofiber. Topical treatment orders provided for bedside nurses to perform as follows: Apply Medihoney to sacrum/inner gluteal fold wound Q day, then cover with foam dressing.  (Change foam dressing Q 3 days or PRN soiling.) Please re-consult if further assistance is needed.  Thank-you,  Julien Girt MSN, [place], [place], [place], [place]

## 2021-06-29 DIAGNOSIS — R6521 Severe sepsis with septic shock: Secondary | ICD-10-CM | POA: Diagnosis not present

## 2021-06-29 DIAGNOSIS — A419 Sepsis, unspecified organism: Secondary | ICD-10-CM | POA: Diagnosis not present

## 2021-06-29 DIAGNOSIS — Z7189 Other specified counseling: Secondary | ICD-10-CM | POA: Diagnosis not present

## 2021-06-29 LAB — BASIC METABOLIC PANEL
Anion gap: 2 — ABNORMAL LOW (ref 5–15)
BUN: 25 mg/dL — ABNORMAL HIGH (ref 8–23)
CO2: 27 mmol/L (ref 22–32)
Calcium: 8.4 mg/dL — ABNORMAL LOW (ref 8.9–10.3)
Chloride: 120 mmol/L — ABNORMAL HIGH (ref 98–111)
Creatinine, Ser: 0.97 mg/dL (ref 0.44–1.00)
GFR, Estimated: >60 mL/min (ref >60)
Glucose, Bld: 119 mg/dL — ABNORMAL HIGH (ref 70–99)
Potassium: 4.2 mmol/L (ref 3.5–5.1)
Sodium: 149 mmol/L — ABNORMAL HIGH (ref 135–145)

## 2021-06-29 LAB — GLUCOSE, CAPILLARY
Glucose-Capillary: 113 mg/dL — ABNORMAL HIGH (ref 70–99)
Glucose-Capillary: 114 mg/dL — ABNORMAL HIGH (ref 70–99)
Glucose-Capillary: 129 mg/dL — ABNORMAL HIGH (ref 70–99)
Glucose-Capillary: 134 mg/dL — ABNORMAL HIGH (ref 70–99)
Glucose-Capillary: 143 mg/dL — ABNORMAL HIGH (ref 70–99)
Glucose-Capillary: 157 mg/dL — ABNORMAL HIGH (ref 70–99)

## 2021-06-29 LAB — CBC
HCT: 29.7 % — ABNORMAL LOW (ref 36.0–46.0)
Hemoglobin: 8.8 g/dL — ABNORMAL LOW (ref 12.0–15.0)
MCH: 30.1 pg (ref 26.0–34.0)
MCHC: 29.6 g/dL — ABNORMAL LOW (ref 30.0–36.0)
MCV: 101.7 fL — ABNORMAL HIGH (ref 80.0–100.0)
Platelets: 54 10*3/uL — ABNORMAL LOW (ref 150–400)
RBC: 2.92 MIL/uL — ABNORMAL LOW (ref 3.87–5.11)
RDW: 18.8 % — ABNORMAL HIGH (ref 11.5–15.5)
WBC: 13.7 10*3/uL — ABNORMAL HIGH (ref 4.0–10.5)
nRBC: 0.2 % (ref 0.0–0.2)

## 2021-06-29 LAB — MAGNESIUM: Magnesium: 2 mg/dL (ref 1.7–2.4)

## 2021-06-29 MED ORDER — IPRATROPIUM-ALBUTEROL 0.5-2.5 (3) MG/3ML IN SOLN
3.0000 mL | Freq: Two times a day (BID) | RESPIRATORY_TRACT | Status: DC
  Administered 2021-06-29 – 2021-06-30 (×2): 3 mL via RESPIRATORY_TRACT
  Filled 2021-06-29 (×2): qty 3

## 2021-06-29 MED ORDER — ZINC SULFATE 220 (50 ZN) MG PO CAPS
220.0000 mg | ORAL_CAPSULE | Freq: Every day | ORAL | Status: DC
  Administered 2021-06-29 – 2021-06-30 (×2): 220 mg via ORAL
  Filled 2021-06-29 (×2): qty 1

## 2021-06-29 MED ORDER — NEPRO/CARBSTEADY PO LIQD
237.0000 mL | Freq: Three times a day (TID) | ORAL | Status: DC
  Administered 2021-06-29 – 2021-06-30 (×2): 237 mL via ORAL

## 2021-06-29 MED ORDER — LATANOPROST 0.005 % OP SOLN
1.0000 [drp] | Freq: Every day | OPHTHALMIC | Status: DC
Start: 2021-06-29 — End: 2021-06-30
  Administered 2021-06-29: 1 [drp] via OPHTHALMIC
  Filled 2021-06-29: qty 2.5

## 2021-06-29 MED ORDER — ASCORBIC ACID 500 MG PO TABS
500.0000 mg | ORAL_TABLET | Freq: Two times a day (BID) | ORAL | Status: DC
  Administered 2021-06-29 – 2021-06-30 (×3): 500 mg via ORAL
  Filled 2021-06-29 (×3): qty 1

## 2021-06-29 MED ORDER — ADULT MULTIVITAMIN W/MINERALS CH
1.0000 | ORAL_TABLET | Freq: Every day | ORAL | Status: DC
  Administered 2021-06-29 – 2021-06-30 (×2): 1 via ORAL
  Filled 2021-06-29 (×2): qty 1

## 2021-06-29 MED ORDER — DEXTROSE 5 % IV SOLN: INTRAVENOUS | Status: AC

## 2021-06-29 NOTE — Progress Notes (Signed)
Pt scored an 8 on the RT protocol assessment. She is not wheezing so nebulizer treatments are to be changed to BID.

## 2021-06-29 NOTE — Progress Notes (Signed)
Initial Nutrition Assessment  DOCUMENTATION CODES:   Obesity unspecified  INTERVENTION:   -Feeding assistance with meals -Nepro Shake po TID, each supplement provides 425 kcal and 19 grams protein  -MVI with minerals daily -500 mg vitamin C BID -220 mg zinc sulfate daily x 14 days  NUTRITION DIAGNOSIS:   Increased nutrient needs related to wound healing as evidenced by estimated needs.  GOAL:   Patient will meet greater than or equal to 90% of their needs  MONITOR:   PO intake, Supplement acceptance, Diet advancement  REASON FOR ASSESSMENT:   Low Braden    ASSESSMENT:   Pt with developmental delay, dementia, Down syndrome who comes in with concerns for hypotension and tachycardia admitted for septic shock.  Pt admitted with septic shock secondary to obstructed kidney stone.   6/10- s/p lt uretal stent placement 6/11- extubated 6/13- s/p BSE- advanced to dysphagia 1 diet with nectar thick liquids  Reviewed I/O's: +224 ml x 24 hours and +3.1 L since admission  UOP: 930 ml x 24 hours  Pressure injury prevention RNs at bedside, who report pt has at least a stage 2 pressure injury to sacrum.   Pt lying in bed, smiled at RD when greeted but otherwise minimally interactive. Unable to obtain further nutrition-related history at this time.   Pt is currently on a dysphagia 1 diet with nectar thick liquids. Noted meal completions 75%.   Pt with increased nutritional needs for wound healing and would benefit from addition of oral nutrition supplements.    Reviewed wt hx; pt has experienced a 6.7% wt loss over the past 3 months, which is not significant for time frame.   Palliative care following for goals of care discussions.   Medications reviewed and include keppra and dextrose 5% solution @ 50 ml/hr.   Labs reviewed: CBGS: 114-143.    NUTRITION - FOCUSED PHYSICAL EXAM:  Flowsheet Row Most Recent Value  Orbital Region No depletion  Upper Arm Region No depletion   Thoracic and Lumbar Region No depletion  Buccal Region No depletion  Temple Region No depletion  Clavicle Bone Region No depletion  Clavicle and Acromion Bone Region No depletion  Scapular Bone Region No depletion  Dorsal Hand No depletion  Patellar Region No depletion  Anterior Thigh Region No depletion  Posterior Calf Region No depletion  Edema (RD Assessment) Moderate  Hair Reviewed  Eyes Reviewed  Mouth Reviewed  Skin Reviewed  Nails Reviewed       Diet Order:   Diet Order             DIET - DYS 1 Room service appropriate? No; Fluid consistency: Nectar Thick  Diet effective now                   EDUCATION NEEDS:   No education needs have been identified at this time  Skin:  Skin Assessment: Skin Integrity Issues: Skin Integrity Issues:: DTI, Stage II, Incisions DTI: bilteral buttocks Stage II: sacrum Incisions: perineum  Last BM:  06/27/21  Height:   Ht Readings from Last 1 Encounters:  06/25/21 5\' 8"  (1.727 m)    Weight:   Wt Readings from Last 1 Encounters:  06/28/21 92.4 kg    Ideal Body Weight:  63.6 kg  BMI:  Body mass index is 30.97 kg/m.  Estimated Nutritional Needs:   Kcal:  1900-2100  Protein:  95-110 grams  Fluid:  > 1.9 L    Loistine Chance, RD, LDN, [place] Registered Dietitian II  Certified Diabetes Care and Education Specialist Please refer to [facility] for RD and/or RD on-call/weekend/after hours pager

## 2021-06-29 NOTE — Progress Notes (Signed)
Daily Progress Note   Patient Name: Lori Petersen [facility]       Date: 06/29/2021 DOB: [birth year]  Age: 66 y.o. MRN#: [id number] Attending Physician: Tresa Moore, MD Primary Care Physician: Hillery Aldo, MD Admit Date: 06/25/2021  Reason for Consultation/Follow-up: Establishing goals of care  Subjective: Patient is resting in bed, no family at bedside. Spoke with SW regarding my conversation yesterday with patient's sister who is her HPOA. SW to confirm if HPOA continues to be decision maker for patient at this time.   Length of Stay: 4  Current Medications: Scheduled Meds:   chlorhexidine  15 mL Mouth Rinse BID   Chlorhexidine Gluconate Cloth  6 each Topical Daily   ipratropium-albuterol  3 mL Nebulization TID   latanoprost  1 drop Left Eye QHS   leptospermum manuka honey  1 application  Topical Daily   levETIRAcetam  750 mg Oral BID   levothyroxine  100 mcg Oral QAC breakfast   pantoprazole  40 mg Oral QHS    Continuous Infusions:  sodium chloride      ceFAZolin (ANCEF) IV 2 g (06/29/21 0603)   dextrose 50 mL/hr at 06/29/21 0949    PRN Meds: docusate sodium, ipratropium-albuterol, LORazepam, polyethylene glycol  Physical Exam Pulmonary:     Effort: Pulmonary effort is normal.  Neurological:     Mental Status: She is alert.             Vital Signs: BP (!) 95/46 (BP Location: Right Arm)   Pulse 81   Temp 98.6 F (37 C)   Resp 18   Ht 5\' 8"  (1.727 m)   Wt 92.4 kg   SpO2 98%   BMI 30.97 kg/m  SpO2: SpO2: 98 % O2 Device: O2 Device: Nasal Cannula O2 Flow Rate: O2 Flow Rate (L/min): 3 L/min  Intake/output summary:  Intake/Output Summary (Last 24 hours) at 06/29/2021 1037 Last data filed at 06/29/2021 1018 Gross per 24 hour  Intake 1263.55 ml  Output 885 ml  Net  378.55 ml   LBM: Last BM Date : 06/27/21 Baseline Weight: Weight: 88.9 kg Most recent weight: Weight: 92.4 kg     Patient Active Problem List   Diagnosis Date Noted   Hydronephrosis with urinary obstruction due to ureteral calculus    Respiratory failure (HCC) 05/18/2021   Pneumonia  05/17/2021   Dementia (HCC)    HLD (hyperlipidemia)    Sepsis (HCC)    Hypernatremia 03/26/2021   Thrombocytopenia (HCC) 03/26/2021   Asthma, mild intermittent 03/25/2021   Nausea & vomiting 03/25/2021   Bacteremia due to Escherichia coli 03/25/2021   Complicated UTI (urinary tract infection) 03/25/2021   Down syndrome 03/25/2021   AKI (acute kidney injury) (HCC) 03/24/2021   Septic shock (HCC)    Ureteral calculus    Seizures (HCC) 12/03/2018   Obesity, Class III, 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   Chronic diastolic CHF (congestive heart failure) (HCC) 11/06/2018   Acute metabolic encephalopathy 11/06/2018   Hypothyroid 11/06/2018   COVID-19 virus infection 11/03/2018   COVID-19    Acute respiratory failure with hypoxia (HCC) 11/02/2018   Left tibial fracture 04/27/2017   Right tibial fracture 04/27/2017   Problems with swallowing and mastication    Blood in stool    Bradycardia 05/14/2015   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   Code Status:    Code Status Orders  (From admission, onward)           Start     Ordered   06/25/21 1322  Full code  Continuous        06/25/21 1327           Code Status History     Date Active Date Inactive Code Status Order ID Comments User Context   05/17/2021 1605 05/30/2021 1958 Full Code [id number]  Joycelyn Das, MD ED   03/24/2021 1420 03/29/2021 1653 Full Code [id number]  Emeline General, MD ED   12/03/2018 1308 12/05/2018 1714 Full  Code [id number]  Gertha Calkin, MD ED   11/03/2018 1734 11/07/2018 1652 DNR [id number]  Leroy Sea, MD Inpatient   11/03/2018 0203 11/03/2018 1717 DNR [id number]  Shaune Pollack, MD ED   04/27/2017 1750 04/30/2017 2041 Full Code [id number]  Enid Baas, MD Inpatient       Prognosis: Poor    Care plan was discussed with SW  Thank you for allowing the Palliative Medicine Team to assist in the care of this patient.   Morton Stall, NP  Please contact Palliative Medicine Team phone at [phone] for questions and concerns.

## 2021-06-29 NOTE — Progress Notes (Signed)
PROGRESS NOTE    [facility]ben [facility]  RUE:[id number] DOB: 1955/05/11 DOA: 06/25/2021 PCP: Hillery AldoPatel, Sarah, MD    Brief Narrative:  66 y.o. female with developmental delay, dementia, Down syndrome, seizure disorder who presented from LTC SNF with concerns for hypotension and tachycardia and admitted for septic shock by PCCM.    On review of records in March 2023 she had sepsis and had stent placement at that time.  There was difficulty following up due to coordination and she was hospitalized for pneumonia after that.  Pt had cystoscopy, laser lithotripsy and a stent change with Dr. Richardo HanksSninsky the day before on 06/24/21.  Assessment & Plan:   Principal Problem:   Septic shock (HCC) Active Problems:   Ureteral calculus   Hydronephrosis with urinary obstruction due to ureteral calculus  Acute on chronic hypoxemic respiratory failure On 2L O2 at night at baseline --pt was on mech vent and extubated on 6/11.  Currently on 2L O2.   Septic shock 2/2 E coli bacteremia likely from urinary source --Was on pressor briefly.  started on vanc/cefe/flagyl, then de-escalated to ceftriaxone -- Continue Ancef for today.  Can likely de-escalate further to p.o. Bactrim per sensitivities at time of discharge   Obstructing left ureteral stone S/p left ureteral stent placement with Dr. Lonna CobbStoioff on 06/25/21 -- Foley catheter discontinued 6/13 -Cystoscopy stent removal with Dr. Lonna CobbStoioff in 7 to 10 days   AKI, resolved --Cr 3.05 on presentation.  Now normalized   Acute toxic metabolic encephalopathy  --Per PCP, at baseline pt is awake and can answer simple questions with yes/no.   --pt was somnolent after extubation, mental status waxing and waning.  Per bedside RN did eat complete breakfast on 6/14   Hx of seizure --cont home keppra    Thrombocytopenia --likely due to sepsis.  Plt dropped from 91 on presentation to 47.,  Now up trending to 54 --hold DVT ppx --monitor   Hypoglycemia, resolved --due to NPO  status and having received SSI --BG check before meals and at bedtime -- Discontinue D5 LR -Start D5 water at 50 cc/h x 12 hours  Hypernatremia Hyperchloremia Likely secondary to intravascular volume depletion.  P.o. intake has been poor but now improving.  Will recommend D5 water at 50 cc/h x 12 hours.  Recheck labs in AM.   DVT prophylaxis: SCD Code Status: Full Family Communication: None today Disposition Plan: Status is: Inpatient Remains inpatient appropriate because: Resolving septic shock.  Anticipate medical readiness for discharge within 24 to 48 hours.   Level of care: Med-Surg  Consultants:  Palliative care Procedures:  None  Antimicrobials: Ancef   Subjective: Patient seen and examined.  Somnolent this morning.  Per bedside RN had woken up and completed entire breakfast  Objective: Vitals:   06/28/21 2123 06/29/21 0348 06/29/21 0733 06/29/21 0815  BP: 111/64 (!) 113/54  (!) 95/46  Pulse: 92 85  81  Resp:  20  18  Temp:  99 F (37.2 C)  98.6 F (37 C)  TempSrc:  Oral    SpO2: 100% 99% 98% 98%  Weight:      Height:        Intake/Output Summary (Last 24 hours) at 06/29/2021 1222 Last data filed at 06/29/2021 1018 Gross per 24 hour  Intake 995.29 ml  Output 885 ml  Net 110.29 ml   Filed Weights   06/26/21 0500 06/27/21 0500 06/28/21 0500  Weight: 93.1 kg 93.1 kg 92.4 kg    Examination:  General exam: Somnolent.  Appears chronically ill Respiratory system: Bibasilar crackles.  Normal work of breathing.  2 L Cardiovascular system: S1-S2, RRR, no murmurs, no pedal edema Gastrointestinal system: Soft, NT/ND, normal bowel sounds Central nervous system: Lethargic.  Oriented x0. Extremities: Unable to assess power.  Bilateral muscle wasting noted Skin: No rashes, lesions or ulcers Psychiatry: Unable to assess    Data Reviewed: I have personally reviewed following labs and imaging studies  CBC: Recent Labs  Lab 06/25/21 1119 06/26/21 0502  06/27/21 0536 06/28/21 0342 06/29/21 0533  WBC 52.2* 50.3* 49.9* 41.9* 13.7*  NEUTROABS 46.3*  --   --   --   --   HGB 9.8* 9.0* 8.8* 9.1* 8.8*  HCT 33.5* 28.6* 29.2* 30.7* 29.7*  MCV 105.7* 100.4* 102.1* 103.0* 101.7*  PLT 91* 59* 47* 53* 54*   Basic Metabolic Panel: Recent Labs  Lab 06/25/21 2028 06/26/21 0502 06/27/21 0536 06/28/21 0342 06/29/21 0533  NA 139 141 144 148* 149*  K 5.3* 4.3 4.4 4.3 4.2  CL 110 111 112* 116* 120*  CO2 18* 21* GLUCOSE 205* 249* 135* 80 119*  BUN 29* 29* 29* 28* 25*  CREATININE 2.49* 1.89* 1.49* 1.16* 0.97  CALCIUM 7.3* 7.2* 7.3* 8.0* 8.4*  MG 1.8 1.9 1.9 2.2 2.0  PHOS 4.5 3.2 2.7 2.3*  --    GFR: Estimated Creatinine Clearance: 68.7 mL/min (by C-G formula based on SCr of 0.97 mg/dL). Liver Function Tests: Recent Labs  Lab 06/25/21 1119  AST 84*  ALT 32  ALKPHOS 52  BILITOT 0.5  PROT 5.9*  ALBUMIN 2.5*   No results for input(s): "LIPASE", "AMYLASE" in the last 168 hours. No results for input(s): "AMMONIA" in the last 168 hours. Coagulation Profile: Recent Labs  Lab 06/25/21 1119  INR 1.4*   Cardiac Enzymes: No results for input(s): "CKTOTAL", "CKMB", "CKMBINDEX", "TROPONINI" in the last 168 hours. BNP (last 3 results) No results for input(s): "PROBNP" in the last 8760 hours. HbA1C: No results for input(s): "HGBA1C" in the last 72 hours. CBG: Recent Labs  Lab 06/28/21 2049 06/29/21 0047 06/29/21 0605 06/29/21 0816 06/29/21 1152  GLUCAP 138* 134* 129* 114* 143*   Lipid Profile: No results for input(s): "CHOL", "HDL", "LDLCALC", "TRIG", "CHOLHDL", "LDLDIRECT" in the last 72 hours. Thyroid Function Tests: No results for input(s): "TSH", "T4TOTAL", "FREET4", "T3FREE", "THYROIDAB" in the last 72 hours. Anemia Panel: No results for input(s): "VITAMINB12", "FOLATE", "FERRITIN", "TIBC", "IRON", "RETICCTPCT" in the last 72 hours. Sepsis Labs: Recent Labs  Lab 06/25/21 1119 06/25/21 1335 06/25/21 2028  06/26/21 0033 06/26/21 0502 06/26/21 1340 06/27/21 0536  PROCALCITON >150.00  --   --   --  97.57  --  51.03  LATICACIDVEN 5.1*   < > 4.0* 5.8* 4.4* 2.1*  --    < > = values in this interval not displayed.    Recent Results (from the past 240 hour(s))  Resp Panel by RT-PCR (Flu A&B, Covid) Anterior Nasal Swab     Status: None   Collection Time: 06/25/21 11:19 AM   Specimen: Anterior Nasal Swab  Result Value Ref Range Status   SARS Coronavirus 2 by RT PCR NEGATIVE NEGATIVE Final    Comment: (NOTE) SARS-CoV-2 target nucleic acids are NOT DETECTED.  The SARS-CoV-2 RNA is generally detectable in upper respiratory specimens during the acute phase of infection. The lowest concentration of SARS-CoV-2 viral copies this assay can detect is 138 copies/mL. A negative result does not preclude SARS-Cov-2 infection and should not be  used as the sole basis for treatment or other patient management decisions. A negative result may occur with  improper specimen collection/handling, submission of specimen other than nasopharyngeal swab, presence of viral mutation(s) within the areas targeted by this assay, and inadequate number of viral copies(<138 copies/mL). A negative result must be combined with clinical observations, patient history, and epidemiological information. The expected result is Negative.  Fact Sheet for Patients:  BloggerCourse.com  Fact Sheet for Healthcare Providers:  SeriousBroker.it  This test is no t yet approved or cleared by the [place] FDA and  has been authorized for detection and/or diagnosis of SARS-CoV-2 by FDA under an Emergency Use Authorization (EUA). This EUA will remain  in effect (meaning this test can be used) for the duration of the COVID-19 declaration under Section 564(b)(1) of the Act, 21 U.S.C.section 360bbb-3(b)(1), unless the authorization is terminated  or revoked sooner.       Influenza A  by PCR NEGATIVE NEGATIVE Final   Influenza B by PCR NEGATIVE NEGATIVE Final    Comment: (NOTE) The Xpert Xpress SARS-CoV-2/FLU/RSV plus assay is intended as an aid in the diagnosis of influenza from Nasopharyngeal swab specimens and should not be used as a sole basis for treatment. Nasal washings and aspirates are unacceptable for Xpert Xpress SARS-CoV-2/FLU/RSV testing.  Fact Sheet for Patients: BloggerCourse.com  Fact Sheet for Healthcare Providers: SeriousBroker.it  This test is not yet approved or cleared by the [place] FDA and has been authorized for detection and/or diagnosis of SARS-CoV-2 by FDA under an Emergency Use Authorization (EUA). This EUA will remain in effect (meaning this test can be used) for the duration of the COVID-19 declaration under Section 564(b)(1) of the Act, 21 U.S.C. section 360bbb-3(b)(1), unless the authorization is terminated or revoked.  Performed at [facility], [address] [place], [place] [address]   Blood Culture (routine x 2)     Status: Abnormal   Collection Time: 06/25/21 11:20 AM   Specimen: BLOOD RIGHT HAND  Result Value Ref Range Status   Specimen Description   Final    BLOOD RIGHT HAND Performed at [facility], [address] [place], [place] [address]    Special Requests   Final    BOTTLES DRAWN AEROBIC AND ANAEROBIC Blood Culture results may not be optimal due to an inadequate volume of blood received in culture bottles Performed at [facility], [address] [place], [place] [address]    Culture  Setup Time   Final    IN BOTH AEROBIC AND ANAEROBIC BOTTLES GRAM NEGATIVE RODS CRITICAL RESULT CALLED TO, READ BACK BY AND VERIFIED WITH: RODNEY GRUBB 06/26/21 @ 0907 BY SB    Culture ESCHERICHIA COLI (A)  Final   Report Status 06/28/2021 FINAL  Final   Organism ID, Bacteria ESCHERICHIA COLI  Final      Susceptibility   Escherichia coli -  MIC*    AMPICILLIN >=32 RESISTANT Resistant     CEFAZOLIN <=4 SENSITIVE Sensitive     CEFEPIME <=0.12 SENSITIVE Sensitive     CEFTAZIDIME <=1 SENSITIVE Sensitive     CEFTRIAXONE <=0.25 SENSITIVE Sensitive     CIPROFLOXACIN >=4 RESISTANT Resistant     GENTAMICIN <=1 SENSITIVE Sensitive     IMIPENEM <=0.25 SENSITIVE Sensitive     TRIMETH/SULFA <=20 SENSITIVE Sensitive     AMPICILLIN/SULBACTAM 8 SENSITIVE Sensitive     PIP/TAZO <=4 SENSITIVE Sensitive     * ESCHERICHIA COLI  Blood Culture ID Panel (Reflexed)  Status: Abnormal   Collection Time: 06/25/21 11:20 AM  Result Value Ref Range Status   Enterococcus faecalis NOT DETECTED NOT DETECTED Final   Enterococcus Faecium NOT DETECTED NOT DETECTED Final   Listeria monocytogenes NOT DETECTED NOT DETECTED Final   Staphylococcus species NOT DETECTED NOT DETECTED Final   Staphylococcus aureus (BCID) NOT DETECTED NOT DETECTED Final   Staphylococcus epidermidis NOT DETECTED NOT DETECTED Final   Staphylococcus lugdunensis NOT DETECTED NOT DETECTED Final   Streptococcus species NOT DETECTED NOT DETECTED Final   Streptococcus agalactiae NOT DETECTED NOT DETECTED Final   Streptococcus pneumoniae NOT DETECTED NOT DETECTED Final   Streptococcus pyogenes NOT DETECTED NOT DETECTED Final   A.calcoaceticus-baumannii NOT DETECTED NOT DETECTED Final   Bacteroides fragilis NOT DETECTED NOT DETECTED Final   Enterobacterales DETECTED (A) NOT DETECTED Final    Comment: Enterobacterales represent a large order of gram negative bacteria, not a single organism. CRITICAL RESULT CALLED TO, READ BACK BY AND VERIFIED WITH: RODNEY GRUBB 06/26/21 @ 0907 BY SB    Enterobacter cloacae complex NOT DETECTED NOT DETECTED Final   Escherichia coli DETECTED (A) NOT DETECTED Final    Comment: CRITICAL RESULT CALLED TO, READ BACK BY AND VERIFIED WITH: RODNEY GRUBB 06/26/21 @ 0907 BY SB    Klebsiella aerogenes NOT DETECTED NOT DETECTED Final   Klebsiella oxytoca NOT  DETECTED NOT DETECTED Final   Klebsiella pneumoniae NOT DETECTED NOT DETECTED Final   Proteus species NOT DETECTED NOT DETECTED Final   Salmonella species NOT DETECTED NOT DETECTED Final   Serratia marcescens NOT DETECTED NOT DETECTED Final   Haemophilus influenzae NOT DETECTED NOT DETECTED Final   Neisseria meningitidis NOT DETECTED NOT DETECTED Final   Pseudomonas aeruginosa NOT DETECTED NOT DETECTED Final   Stenotrophomonas maltophilia NOT DETECTED NOT DETECTED Final   Candida albicans NOT DETECTED NOT DETECTED Final   Candida auris NOT DETECTED NOT DETECTED Final   Candida glabrata NOT DETECTED NOT DETECTED Final   Candida krusei NOT DETECTED NOT DETECTED Final   Candida parapsilosis NOT DETECTED NOT DETECTED Final   Candida tropicalis NOT DETECTED NOT DETECTED Final   Cryptococcus neoformans/gattii NOT DETECTED NOT DETECTED Final   CTX-M ESBL NOT DETECTED NOT DETECTED Final   Carbapenem resistance IMP NOT DETECTED NOT DETECTED Final   Carbapenem resistance KPC NOT DETECTED NOT DETECTED Final   Carbapenem resistance NDM NOT DETECTED NOT DETECTED Final   Carbapenem resist OXA 48 LIKE NOT DETECTED NOT DETECTED Final   Carbapenem resistance VIM NOT DETECTED NOT DETECTED Final    Comment: Performed at [facility], [address] [place], [place] [address]  Blood Culture (routine x 2)     Status: None (Preliminary result)   Collection Time: 06/25/21 11:21 AM   Specimen: BLOOD  Result Value Ref Range Status   Specimen Description BLOOD RIGHT ANTECUBITAL  Final   Special Requests   Final    BOTTLES DRAWN AEROBIC AND ANAEROBIC Blood Culture results may not be optimal due to an excessive volume of blood received in culture bottles   Culture   Final    NO GROWTH 4 DAYS Performed at [facility], [address] [place], [place] [address]    Report Status PENDING  Incomplete  Urine Culture     Status: Abnormal   Collection Time: 06/25/21  1:35 PM   Specimen: In/Out  Cath Urine  Result Value Ref Range Status   Specimen Description   Final    IN/OUT CATH URINE Performed at [facility], [address], [place], [place] [address]    Special Requests   Final    NONE Performed at [facility], [address] [place], [place] [address]    Culture 2,000 COLONIES/mL YEAST (A)  Final   Report Status 06/27/2021 FINAL  Final  MRSA Next Gen by PCR, Nasal     Status: None   Collection Time: 06/25/21  2:21 PM   Specimen: Nasal Mucosa; Nasal Swab  Result Value Ref Range Status   MRSA by PCR Next Gen NOT DETECTED NOT DETECTED Final    Comment: (NOTE) The GeneXpert MRSA Assay (FDA approved for NASAL specimens only), is one component of a comprehensive MRSA colonization surveillance program. It is not intended to diagnose MRSA infection nor to guide or monitor treatment for MRSA infections. Test performance is not FDA approved in patients less than 62 years old. Performed at [facility], [address], [place], [place] [address]   Aerobic/Anaerobic Culture w Gram Stain (surgical/deep wound)     Status: None (Preliminary result)   Collection Time: 06/25/21  4:46 PM   Specimen: PATH Other; Tissue  Result Value Ref Range Status   Specimen Description   Final    URINE, RANDOM URINE FROM LEFT RENAL PELVIS Performed at [facility], [address], [place], [place] [address]    Special Requests   Final    NONE Performed at [facility], [address] [place], [place] [address]    Gram Stain   Final    RARE WBC PRESENT, PREDOMINANTLY MONONUCLEAR NO ORGANISMS SEEN    Culture   Final    NO GROWTH 3 DAYS NO ANAEROBES ISOLATED; CULTURE IN PROGRESS FOR 5 DAYS Performed at [facility], [address], [place], [place] [address]    Report Status PENDING  Incomplete         Radiology Studies: No results found.      Scheduled Meds:  chlorhexidine  15 mL Mouth Rinse BID   Chlorhexidine  Gluconate Cloth  6 each Topical Daily   ipratropium-albuterol  3 mL Nebulization TID   latanoprost  1 drop Left Eye QHS   leptospermum manuka honey  1 application  Topical Daily   levETIRAcetam  750 mg Oral BID   levothyroxine  100 mcg Oral QAC breakfast   pantoprazole  40 mg Oral QHS   Continuous Infusions:  sodium chloride      ceFAZolin (ANCEF) IV 2 g (06/29/21 0603)   dextrose 50 mL/hr at 06/29/21 0949     LOS: 4 days     Tresa Moore, MD [facility]   If 7PM-7AM, please contact night-coverage  06/29/2021, 12:22 PM

## 2021-06-30 ENCOUNTER — Other Ambulatory Visit: Payer: Self-pay | Admitting: Urology

## 2021-06-30 DIAGNOSIS — A419 Sepsis, unspecified organism: Secondary | ICD-10-CM | POA: Diagnosis not present

## 2021-06-30 DIAGNOSIS — N200 Calculus of kidney: Secondary | ICD-10-CM

## 2021-06-30 DIAGNOSIS — R6521 Severe sepsis with septic shock: Secondary | ICD-10-CM | POA: Diagnosis not present

## 2021-06-30 LAB — CBC
HCT: 29.4 % — ABNORMAL LOW (ref 36.0–46.0)
Hemoglobin: 8.8 g/dL — ABNORMAL LOW (ref 12.0–15.0)
MCH: 30.2 pg (ref 26.0–34.0)
MCHC: 29.9 g/dL — ABNORMAL LOW (ref 30.0–36.0)
MCV: 101 fL — ABNORMAL HIGH (ref 80.0–100.0)
Platelets: 47 10*3/uL — ABNORMAL LOW (ref 150–400)
RBC: 2.91 MIL/uL — ABNORMAL LOW (ref 3.87–5.11)
RDW: 18.6 % — ABNORMAL HIGH (ref 11.5–15.5)
WBC: 8.2 10*3/uL (ref 4.0–10.5)
nRBC: 0.4 % — ABNORMAL HIGH (ref 0.0–0.2)

## 2021-06-30 LAB — CULTURE, BLOOD (ROUTINE X 2): Culture: NO GROWTH

## 2021-06-30 LAB — MAGNESIUM: Magnesium: 1.8 mg/dL (ref 1.7–2.4)

## 2021-06-30 LAB — BASIC METABOLIC PANEL
Anion gap: 5 (ref 5–15)
BUN: 22 mg/dL (ref 8–23)
CO2: 30 mmol/L (ref 22–32)
Calcium: 8.1 mg/dL — ABNORMAL LOW (ref 8.9–10.3)
Chloride: 109 mmol/L (ref 98–111)
Creatinine, Ser: 0.94 mg/dL (ref 0.44–1.00)
GFR, Estimated: >60 mL/min (ref >60)
Glucose, Bld: 165 mg/dL — ABNORMAL HIGH (ref 70–99)
Potassium: 3.8 mmol/L (ref 3.5–5.1)
Sodium: 144 mmol/L (ref 135–145)

## 2021-06-30 LAB — GLUCOSE, CAPILLARY: Glucose-Capillary: 125 mg/dL — ABNORMAL HIGH (ref 70–99)

## 2021-06-30 MED ORDER — SULFAMETHOXAZOLE-TRIMETHOPRIM 800-160 MG PO TABS: 1.0000 | ORAL_TABLET | Freq: Two times a day (BID) | ORAL | 0 refills | Status: AC

## 2021-06-30 MED ORDER — SULFAMETHOXAZOLE-TRIMETHOPRIM 800-160 MG PO TABS
1.0000 | ORAL_TABLET | Freq: Two times a day (BID) | ORAL | Status: DC
  Filled 2021-06-30: qty 1

## 2021-06-30 NOTE — Progress Notes (Addendum)
1015-- report called to nurse at [facility] facility. All questions answered. AVS placed in d/c packet to return to facility with pt. LDA removal complete. Awaiting arrival of [facility] transport for d/c.

## 2021-06-30 NOTE — Progress Notes (Unsigned)
Surgical Physician Order Form [facility]  * Scheduling expectation :  3 to 4 weeks  *Length of Case: 1 hour  *Clearance needed: no  *Anticoagulation Instructions: May continue all anticoagulants  *Aspirin Instructions: Ok to continue all  *Post-op visit Date/Instructions:   tbd  *Diagnosis: Left Nephrolithiasis  *Procedure: left  Ureteroscopy w/laser lithotripsy & stent exchange (21117)   Additional orders: N/A  -Admit type: OUTpatient  -Anesthesia: General  -VTE Prophylaxis Standing Order SCD's       Other:   -Standing Lab Orders Per Anesthesia    Lab other: UA&Urine Culture-needs UA and culture at facility 10 to 14 days prior to surgery date to allow time to place on antibiotics if needed  -Standing Test orders EKG/Chest x-ray per Anesthesia       Test other:   - Medications:  Diflucan 100mg  IV, Ancef 2 g  -Other orders:  N/A

## 2021-06-30 NOTE — TOC Transition Note (Signed)
Transition of Care [facility]) - CM/SW Discharge Note   Patient Details  Name: Lori Petersen [facility] MRN: [id number] Date of Birth: Jul 11, 1955  Transition of Care [facility]) CM/SW Contact:  Durwin Glaze, LCSW Phone Number: 06/30/2021, 10:57 AM   Clinical Narrative:    Pt has orders to discharge today. Legal Guardian was notified. [facility] Baxter Flattery was notified as well. [facility] will be transporting pt back to [facility]. No further concerns. CSW signing off.    Final next level of care: Skilled Nursing Facility Barriers to Discharge: Barriers Resolved   Patient Goals and CMS Choice Patient states their goals for this hospitalization and ongoing recovery are:: Return to [facility] CMS Medicare.gov Compare Post Acute Care list provided to:: Patient Represenative (must comment) Choice offered to / list presented to : [facility] POA / Guardian  Discharge Placement                Patient to be transferred to facility by: [facility] Name of family member notified: Sister/ Legal Guardian Patient and family notified of of transfer: 06/30/21  Discharge Plan and Services   Discharge Planning Services: CM Consult Post Acute Care Choice: Resumption of Svcs/PTA Provider          DME Arranged: N/A (N/A) DME Agency: NA       HH Arranged: NA HH Agency: NA        Social Determinants of Health (SDOH) Interventions     Readmission Risk Interventions    06/29/2021   11:13 AM 06/27/2021   11:00 AM  Readmission Risk Prevention Plan  Transportation Screening Complete Complete  PCP or Specialist Appt within 3-5 Days Complete   HRI or Home Care Consult Complete   Social Work Consult for Recovery Care Planning/Counseling Complete   Palliative Care Screening Complete   Medication Review Oceanographer) Complete Complete  PCP or Specialist appointment within 3-5 days of discharge  Complete  HRI or Home Care Consult  Complete  SW Recovery Care/Counseling Consult  Not Complete  SW Consult Not Complete Comments  NA  Palliative  Care Screening  Complete  Skilled Nursing Facility  Complete

## 2021-06-30 NOTE — Discharge Summary (Addendum)
Physician Discharge Summary  Lori Petersen [facility] LGX:[id number] DOB: 11-15-1955 DOA: 06/25/2021  PCP: Hillery Aldo, MD  Admit date: 06/25/2021 Discharge date: 06/30/2021  Admitted From: SNF Disposition:  SNF  Recommendations for Outpatient Follow-up:  Follow up with PCP in 1-2 weeks Follow up with urology Dr. Lonna Cobb in 7-10 days  Home Health:No Equipment/Devices:Oxygen 3L  Discharge Condition:Stable CODE STATUS:FULL Diet recommendation: Dysphagia 1 (puree) with nectar liquids  Brief/Interim Summary: 66 y.o. female with developmental delay, dementia, Down syndrome, seizure disorder who presented from LTC SNF with concerns for hypotension and tachycardia and admitted for septic shock by PCCM.    On review of records in March 2023 she had sepsis and had stent placement at that time.  There was difficulty following up due to coordination and she was hospitalized for pneumonia after that.  Pt had cystoscopy, laser lithotripsy and a stent change with Dr. Richardo Hanks the day before on 06/24/21.  Sepsis physiology/shock resolved.  Transition to PO bactrim 6/15.  Complete total 14 day course.  Discharge back to [facility].  Follow up with [facility] PCP as directed.  Follow up with urology Dr. Lonna Cobb in 7-10 days.   Discharge Diagnoses:  Principal Problem:   Septic shock (HCC) Active Problems:   Ureteral calculus   Hydronephrosis with urinary obstruction due to ureteral calculus  Acute on chronic hypoxemic respiratory failure On 2L O2 at night at baseline --pt was on mech vent and extubated on 6/11.  Currently on 2L O2.  Base rate   Septic shock 2/2 E coli bacteremia likely from urinary source --Was on pressor briefly.  started on vanc/cefe/flagyl, then de-escalated to ceftriaxone -- De-escalated to Ancef 6/14.  Further wean to PO bactrim on 6/15.  Complete additional 9 days BID dosing   Obstructing left ureteral stone S/p left ureteral stent placement with Dr. Lonna Cobb on 06/25/21 -- Foley  catheter discontinued 6/13 -Cystoscopy stent removal with Dr. Lonna Cobb in 7 to 10 days   AKI, resolved --Cr 3.05 on presentation.  Now normalized   Acute toxic metabolic encephalopathy  --Per PCP, at baseline pt is awake and can answer simple questions with yes/no.   --pt was somnolent after extubation, mental status waxing and waning.  Per bedside RN did eat complete breakfast on 6/14 - More awake and responsive 6/15   Hx of seizure --cont home keppra    Thrombocytopenia --likely due to sepsis.  Plt dropped from 91 on presentation to 47.,  Now up trending to 54    Hypoglycemia, resolved --due to NPO status and having received SSI    Hypernatremia Hyperchloremia Likely secondary to intravascular volume depletion.  P.o. intake has been poor but now improving.  Will recommend D5 water at 50 cc/h x 12 hours.  Recheck labs in AM.  RESOLVED  Discharge Instructions  Discharge Instructions     Diet - low sodium heart healthy   Complete by: As directed    Discharge wound care:   Complete by: As directed    Wound care  Daily      Comments: 1. Apply Medihoney to sacrum/inner gluteal fold wound Q day, then cover with foam dressing.  (Change foam dressing Q 3 days or PRN soiling.) 2. Foam dressing to sacrum/bilat buttocks, change Q 3 days or PRN   Increase activity slowly   Complete by: As directed       Allergies as of 06/30/2021   No Known Allergies      Medication List     STOP taking these  medications    cephALEXin 500 MG capsule Commonly known as: Keflex   predniSONE 20 MG tablet Commonly known as: DELTASONE       TAKE these medications    albuterol (2.5 MG/3ML) 0.083% nebulizer solution Commonly known as: PROVENTIL Take 2.5 mg by nebulization every 4 (four) hours as needed for wheezing or shortness of breath.   aspirin EC 81 MG tablet Take 81 mg by mouth daily.   budesonide-formoterol 80-4.5 MCG/ACT inhaler Commonly known as: SYMBICORT Inhale 2 puffs  into the lungs 2 (two) times daily. With spacer device   cholecalciferol 25 MCG (1000 UNIT) tablet Commonly known as: VITAMIN D3 Take 2,000 Units by mouth daily.   empagliflozin 10 MG Tabs tablet Commonly known as: JARDIANCE Take 10 mg by mouth daily.   latanoprost 0.005 % ophthalmic solution Commonly known as: XALATAN Place 1 drop into the left eye at bedtime.   levETIRAcetam 250 MG tablet Commonly known as: KEPPRA Take 250 mg by mouth 2 (two) times daily. (Take with 500mg  tablet to equal 750mg  total)   levETIRAcetam 500 MG tablet Commonly known as: KEPPRA Take 500 mg by mouth 2 (two) times daily. (Take with 250mg  tablet to equal 750mg  total)   levothyroxine 100 MCG tablet Commonly known as: SYNTHROID Take 100 mcg by mouth daily before breakfast.   midazolam 5 MG/5ML Soln injection Commonly known as: VERSED Inject 2 mg into the muscle See admin instructions. Per MAR: Inject 2ml intramuscularly single dose as needed for seizure lasting longer than 5 minutes.   omeprazole 20 MG capsule Commonly known as: PRILOSEC Take 20 mg by mouth daily.   pravastatin 40 MG tablet Commonly known as: PRAVACHOL Take 40 mg by mouth at bedtime.   senna 8.6 MG Tabs tablet Commonly known as: SENOKOT Take 2 tablets by mouth in the morning and at bedtime.   sulfamethoxazole-trimethoprim 800-160 MG tablet Commonly known as: BACTRIM DS Take 1 tablet by mouth every 12 (twelve) hours for 9 days.   vitamin B-12 1000 MCG tablet Commonly known as: CYANOCOBALAMIN Take 1,000 mcg by mouth daily.   Zinc Oxide 13 % Crea Apply 1 application. topically 3 (three) times daily as needed (skin protection). Apply to gluteal area 3X daily with toileting - to protect skin   Desitin 13 % Crea Generic drug: Zinc Oxide Apply 1 application. topically in the morning and at bedtime.               Discharge Care Instructions  (From admission, onward)           Start     Ordered   06/30/21 0000   Discharge wound care:       Comments: Wound care  Daily      Comments: 1. Apply Medihoney to sacrum/inner gluteal fold wound Q day, then cover with foam dressing.  (Change foam dressing Q 3 days or PRN soiling.) 2. Foam dressing to sacrum/bilat buttocks, change Q 3 days or PRN   06/30/21 0938            Contact information for after-discharge care     Destination     [facility] Preferred SNF .   Service: Skilled Nursing Contact information: [address] [place] [phone]                    No Known Allergies  Consultations: PCCM Palliative care   Pro[facility]ures/Studies[facility]  Result Date: 06/25/2021 CLINICAL DATA:  ET tube, OG tube placement EXAM: PORTABLE CHEST 1 VIEW COMPARISON:  06/25/2021 FINDINGS: Endotracheal tube is in the right mainstem bronchus. Recommend retracting approximately 3 cm. OG tube is in the stomach. Heart is normal size. Areas of atelectasis in the left base. No visible effusions. IMPRESSION: Endotracheal tube in the right mainstem bronchus. Recommend retracting endotracheal tube approximately 3 cm. Left base atelectasis. These results will be called to the ordering clinician or representative by the Radiologist Assistant, and communication documented in the PACS or [employer]. Electronically Signed   By: Charlett Nose M.D.   On: 06/25/2021 20:25   DG OR UROLOGY CYSTO IMAGE ([facility] ONLY)  Result Date: 06/25/2021 There is no interpretation for this exam.  This order is for images obtained during a surgical procedure.  Please See "Surgeries" Tab for more information regarding the procedure.   CT CHEST ABDOMEN PELVIS WO CONTRAST  Result Date: 06/25/2021 CLINICAL DATA:  Sepsis.  History of down syndrome. EXAM: CT CHEST, ABDOMEN AND PELVIS WITHOUT CONTRAST TECHNIQUE: Multidetector CT imaging of the chest, abdomen and pelvis was performed following the standard protocol without IV  contrast. RADIATION DOSE REDUCTION: This exam was performed according to the departmental dose-optimization program which includes automated exposure control, adjustment of the mA and/or kV according to patient size and/or use of iterative reconstruction technique. COMPARISON:  05/17/2021 FINDINGS: CT CHEST FINDINGS Cardiovascular: The heart size is normal. No substantial pericardial effusion. Enlargement of the pulmonary outflow tract/main pulmonary arteries suggests pulmonary arterial hypertension. Mediastinum/Nodes: SIRT no mediastinal No evidence for gross hilar lymphadenopathy although assessment is limited by the lack of intravenous contrast on the current study. The esophagus has normal imaging features. There is no axillary lymphadenopathy. Lungs/Pleura: Interval improvement in aeration right middle lobe. Persistent atelectasis noted in both lower lobes. No substantial pleural effusion. Musculoskeletal: No worrisome lytic or sclerotic osseous abnormality. Degenerative changes noted in both shoulders CT ABDOMEN PELVIS FINDINGS Hepatobiliary: No suspicious focal abnormality in the liver on this study without intravenous contrast. There is no evidence for gallstones, gallbladder wall thickening, or pericholecystic fluid. No intrahepatic or extrahepatic biliary dilation. Pancreas: No focal mass lesion. No dilatation of the main duct. No intraparenchymal cyst. No peripancreatic edema. Spleen: No splenomegaly. No focal mass lesion. Adrenals/Urinary Tract: No adrenal nodule or mass. Left ureteral stent has been removed in the interval. 4 mm stone identified left renal pelvis. Urine in the left renal pelvis is higher in attenuation than expected and may be related to artifact from arm placement or some blood products in the collecting system. Left periureteric edema evident with mild left hydroureter. 3 mm stone is seen in the distal left ureter (108/2). Gas in the bladder lumen likely related to recent  instrumentation. Stomach/Bowel: Stomach is distended with gas and fluid. Duodenum is normally positioned as is the ligament of Treitz. No small bowel wall thickening. No small bowel dilatation. No gross colonic mass. No colonic wall thickening. Vascular/Lymphatic: No abdominal aortic aneurysm. There is no gastrohepatic or hepatoduodenal ligament lymphadenopathy. No retroperitoneal or mesenteric lymphadenopathy. No pelvic sidewall lymphadenopathy. Reproductive: The uterus is unremarkable.  There is no adnexal mass. Other: No intraperitoneal free fluid. Musculoskeletal: No worrisome lytic or sclerotic osseous abnormality. Bilateral pars interarticularis defects noted at L5. IMPRESSION: 1. 3 mm distal left ureteral stone with mild left hydroureteronephrosis. Urine in the left renal pelvis is higher in attenuation than expected and may be related to artifact from arm placement or some blood products in the collecting system. 2. 4 mm stone  identified left renal pelvis. 3. Interval improvement in aeration right middle lobe with persistent atelectasis in both lower lobes. 4. Enlargement of the pulmonary outflow tract/main pulmonary arteries suggests pulmonary arterial hypertension. 5. Gas in the bladder lumen likely related to recent instrumentation. Electronically Signed   By: Kennith Center M.D.   On: 06/25/2021 13:23   CT HEAD WO CONTRAST ( )  Result Date: 06/25/2021 CLINICAL DATA:  Head trauma. Hypotension. Dementia and down syndrome. EXAM: CT HEAD WITHOUT CONTRAST TECHNIQUE: Contiguous axial images were obtained from the base of the skull through the vertex without intravenous contrast. RADIATION DOSE REDUCTION: This exam was performed according to the departmental dose-optimization program which includes automated exposure control, adjustment of the mA and/or kV according to patient size and/or use of iterative reconstruction technique. COMPARISON:  05/20/2021 FINDINGS: Brain: No evidence of acute infarction,  hemorrhage, hydrocephalus, extra-axial collection or mass lesion/mass effect. Diffuse prominence of sulci and ventricles compatible with brain atrophy. There is mild diffuse low-attenuation within the subcortical and periventricular white matter compatible with chronic microvascular disease. Chronic right basal ganglia lacunar infarcts noted. Vascular: No hyperdense vessel or unexpected calcification. Skull: Normal. Negative for fracture or focal lesion. Sinuses/Orbits: No acute abnormality Other: None IMPRESSION: 1. No acute intracranial abnormalities. 2. Chronic small vessel ischemic disease and brain atrophy. Electronically Signed   By: Signa Kell M.D.   On: 06/25/2021 12:48   DG Chest Port 1 View  Result Date: 06/25/2021 CLINICAL DATA:  Pt arrives via EMS from [facility] with SOB, hypotension and fever. Pt had ureter stent placed yesterday. Pressure in 70s for EMS, given 750 cc NS. Started on levo en route. EXAM: PORTABLE CHEST 1 VIEW COMPARISON:  05/25/2021 and older studies. FINDINGS: Cardiac silhouette is mildly enlarged, stable. Stable prominent bilateral pulmonary arteries. No convincing mediastinal or hilar masses. Central vascular prominence. No evidence of pneumonia and no pulmonary edema. No convincing pleural effusion or pneumothorax. Bilateral glenohumeral joint arthropathic changes. IMPRESSION: 1. No acute cardiopulmonary disease. Electronically Signed   By: Amie Portland M.D.   On: 06/25/2021 11:28   DG OR UROLOGY CYSTO IMAGE ([facility] ONLY)  Result Date: 06/24/2021 There is no interpretation for this exam.  This order is for images obtained during a surgical procedure.  Please See "Surgeries" Tab for more information regarding the procedure.      Subjective: Seen and examined on the day of dc.  Mental status at baseline.  Shock/sepsis physiology resolved.  [facility] Exam: Vitals:   06/30/21 0708 06/30/21 0831  BP:  107/62  Pulse:  89  Resp:  18  Temp:  99.1  F (37.3 C)  SpO2: 99% 99%   Vitals:   06/29/21 2035 06/30/21 0343 06/30/21 0708 06/30/21 0831  BP:  100/64  107/62  Pulse:  91  89  Resp:  20  18  Temp:  99.1 F (37.3 C)  99.1 F (37.3 C)  TempSrc:  Oral    SpO2: 96% 100% 99% 99%  Weight:  97.3 kg    Height:            The results of significant diagnostics from this hospitalization (including imaging, microbiology, ancillary and laboratory) are listed below for reference.     Microbiology: Recent Results (from the past 240 hour(s))  Resp Panel by RT-PCR (Flu A&B, Covid) Anterior Nasal Swab     Status: None   Collection Time: 06/25/21 11:19 AM   Specimen: Anterior Nasal Swab  Result Value Ref Range  Status   SARS Coronavirus 2 by RT PCR NEGATIVE NEGATIVE Final    Comment: (NOTE) SARS-CoV-2 target nucleic acids are NOT DETECTED.  The SARS-CoV-2 RNA is generally detectable in upper respiratory specimens during the acute phase of infection. The lowest concentration of SARS-CoV-2 viral copies this assay can detect is 138 copies/mL. A negative result does not preclude SARS-Cov-2 infection and should not be used as the sole basis for treatment or other patient management decisions. A negative result may occur with  improper specimen collection/handling, submission of specimen other than nasopharyngeal swab, presence of viral mutation(s) within the areas targeted by this assay, and inadequate number of viral copies(<138 copies/mL). A negative result must be combined with clinical observations, patient history, and epidemiological information. The expected result is Negative.  Fact Sheet for Patients:  BloggerCourse.com  Fact Sheet for Healthcare Providers:  SeriousBroker.it  This test is no t yet approved or cleared by the [place] FDA and  has been authorized for detection and/or diagnosis of SARS-CoV-2 by FDA under an Emergency Use Authorization (EUA). This EUA  will remain  in effect (meaning this test can be used) for the duration of the COVID-19 declaration under Section 564(b)(1) of the Act, 21 U.S.C.section 360bbb-3(b)(1), unless the authorization is terminated  or revoked sooner.       Influenza A by PCR NEGATIVE NEGATIVE Final   Influenza B by PCR NEGATIVE NEGATIVE Final    Comment: (NOTE) The Xpert Xpress SARS-CoV-2/FLU/RSV plus assay is intended as an aid in the diagnosis of influenza from Nasopharyngeal swab specimens and should not be used as a sole basis for treatment. Nasal washings and aspirates are unacceptable for Xpert Xpress SARS-CoV-2/FLU/RSV testing.  Fact Sheet for Patients: BloggerCourse.com  Fact Sheet for Healthcare Providers: SeriousBroker.it  This test is not yet approved or cleared by the [place] FDA and has been authorized for detection and/or diagnosis of SARS-CoV-2 by FDA under an Emergency Use Authorization (EUA). This EUA will remain in effect (meaning this test can be used) for the duration of the COVID-19 declaration under Section 564(b)(1) of the Act, 21 U.S.C. section 360bbb-3(b)(1), unless the authorization is terminated or revoked.  Performed at [facility], [address] [place], [place] [address]   Blood Culture (routine x 2)     Status: Abnormal   Collection Time: 06/25/21 11:20 AM   Specimen: BLOOD RIGHT HAND  Result Value Ref Range Status   Specimen Description   Final    BLOOD RIGHT HAND Performed at [facility], [address], [place], [place] [address]    Special Requests   Final    BOTTLES DRAWN AEROBIC AND ANAEROBIC Blood Culture results may not be optimal due to an inadequate volume of blood received in culture bottles Performed at [facility], [address] [place], [place] [address]    Culture  Setup Time   Final    IN BOTH AEROBIC AND ANAEROBIC BOTTLES GRAM NEGATIVE RODS CRITICAL  RESULT CALLED TO, READ BACK BY AND VERIFIED WITH: RODNEY GRUBB 06/26/21 @ 0907 BY SB    Culture ESCHERICHIA COLI (A)  Final   Report Status 06/28/2021 FINAL  Final   Organism ID, Bacteria ESCHERICHIA COLI  Final      Susceptibility   Escherichia coli - MIC*    AMPICILLIN >=32 RESISTANT Resistant     CEFAZOLIN <=4 SENSITIVE Sensitive     CEFEPIME <=0.12 SENSITIVE Sensitive     CEFTAZIDIME <=1 SENSITIVE Sensitive  CEFTRIAXONE <=0.25 SENSITIVE Sensitive     CIPROFLOXACIN >=4 RESISTANT Resistant     GENTAMICIN <=1 SENSITIVE Sensitive     IMIPENEM <=0.25 SENSITIVE Sensitive     TRIMETH/SULFA <=20 SENSITIVE Sensitive     AMPICILLIN/SULBACTAM 8 SENSITIVE Sensitive     PIP/TAZO <=4 SENSITIVE Sensitive     * ESCHERICHIA COLI  Blood Culture ID Panel (Reflexed)     Status: Abnormal   Collection Time: 06/25/21 11:20 AM  Result Value Ref Range Status   Enterococcus faecalis NOT DETECTED NOT DETECTED Final   Enterococcus Faecium NOT DETECTED NOT DETECTED Final   Listeria monocytogenes NOT DETECTED NOT DETECTED Final   Staphylococcus species NOT DETECTED NOT DETECTED Final   Staphylococcus aureus (BCID) NOT DETECTED NOT DETECTED Final   Staphylococcus epidermidis NOT DETECTED NOT DETECTED Final   Staphylococcus lugdunensis NOT DETECTED NOT DETECTED Final   Streptococcus species NOT DETECTED NOT DETECTED Final   Streptococcus agalactiae NOT DETECTED NOT DETECTED Final   Streptococcus pneumoniae NOT DETECTED NOT DETECTED Final   Streptococcus pyogenes NOT DETECTED NOT DETECTED Final   A.calcoaceticus-baumannii NOT DETECTED NOT DETECTED Final   Bacteroides fragilis NOT DETECTED NOT DETECTED Final   Enterobacterales DETECTED (A) NOT DETECTED Final    Comment: Enterobacterales represent a large order of gram negative bacteria, not a single organism. CRITICAL RESULT CALLED TO, READ BACK BY AND VERIFIED WITH: RODNEY GRUBB 06/26/21 @ 0907 BY SB    Enterobacter cloacae complex NOT DETECTED NOT  DETECTED Final   Escherichia coli DETECTED (A) NOT DETECTED Final    Comment: CRITICAL RESULT CALLED TO, READ BACK BY AND VERIFIED WITH: RODNEY GRUBB 06/26/21 @ 0907 BY SB    Klebsiella aerogenes NOT DETECTED NOT DETECTED Final   Klebsiella oxytoca NOT DETECTED NOT DETECTED Final   Klebsiella pneumoniae NOT DETECTED NOT DETECTED Final   Proteus species NOT DETECTED NOT DETECTED Final   Salmonella species NOT DETECTED NOT DETECTED Final   Serratia marcescens NOT DETECTED NOT DETECTED Final   Haemophilus influenzae NOT DETECTED NOT DETECTED Final   Neisseria meningitidis NOT DETECTED NOT DETECTED Final   Pseudomonas aeruginosa NOT DETECTED NOT DETECTED Final   Stenotrophomonas maltophilia NOT DETECTED NOT DETECTED Final   Candida albicans NOT DETECTED NOT DETECTED Final   Candida auris NOT DETECTED NOT DETECTED Final   Candida glabrata NOT DETECTED NOT DETECTED Final   Candida krusei NOT DETECTED NOT DETECTED Final   Candida parapsilosis NOT DETECTED NOT DETECTED Final   Candida tropicalis NOT DETECTED NOT DETECTED Final   Cryptococcus neoformans/gattii NOT DETECTED NOT DETECTED Final   CTX-M ESBL NOT DETECTED NOT DETECTED Final   Carbapenem resistance IMP NOT DETECTED NOT DETECTED Final   Carbapenem resistance KPC NOT DETECTED NOT DETECTED Final   Carbapenem resistance NDM NOT DETECTED NOT DETECTED Final   Carbapenem resist OXA 48 LIKE NOT DETECTED NOT DETECTED Final   Carbapenem resistance VIM NOT DETECTED NOT DETECTED Final    Comment: Performed at [facility], [address] [place], [place] [address]  Blood Culture (routine x 2)     Status: None   Collection Time: 06/25/21 11:21 AM   Specimen: BLOOD  Result Value Ref Range Status   Specimen Description BLOOD RIGHT ANTECUBITAL  Final   Special Requests   Final    BOTTLES DRAWN AEROBIC AND ANAEROBIC Blood Culture results may not be optimal due to an excessive volume of blood received in culture bottles   Culture    Final    NO GROWTH 5  DAYS Performed at [facility], [address] [place], [place] [address]    Report Status 06/30/2021 FINAL  Final  Urine Culture     Status: Abnormal   Collection Time: 06/25/21  1:35 PM   Specimen: In/Out Cath Urine  Result Value Ref Range Status   Specimen Description   Final    IN/OUT CATH URINE Performed at [facility], [address], [place], [place] [address]    Special Requests   Final    NONE Performed at [facility], [address] [place], [place] [address]    Culture 2,000 COLONIES/mL YEAST (A)  Final   Report Status 06/27/2021 FINAL  Final  MRSA Next Gen by PCR, Nasal     Status: None   Collection Time: 06/25/21  2:21 PM   Specimen: Nasal Mucosa; Nasal Swab  Result Value Ref Range Status   MRSA by PCR Next Gen NOT DETECTED NOT DETECTED Final    Comment: (NOTE) The GeneXpert MRSA Assay (FDA approved for NASAL specimens only), is one component of a comprehensive MRSA colonization surveillance program. It is not intended to diagnose MRSA infection nor to guide or monitor treatment for MRSA infections. Test performance is not FDA approved in patients less than 24 years old. Performed at [facility], [address] [place], [place] [address]   Aerobic/Anaerobic Culture w Gram Stain (surgical/deep wound)     Status: None (Preliminary result)   Collection Time: 06/25/21  4:46 PM   Specimen: PATH Other; Tissue  Result Value Ref Range Status   Specimen Description   Final    URINE, RANDOM URINE FROM LEFT RENAL PELVIS Performed at [facility], [address] [place], [place] [address]    Special Requests   Final    NONE Performed at [facility], [address] [place], [place] [address]    Gram Stain   Final    RARE WBC PRESENT, PREDOMINANTLY MONONUCLEAR NO ORGANISMS SEEN    Culture   Final    NO GROWTH 3 DAYS NO ANAEROBES ISOLATED; CULTURE IN PROGRESS FOR 5 DAYS Performed  at [facility], [address] [place], [place] [address]    Report Status PENDING  Incomplete     Labs: BNP (last 3 results) Recent Labs    03/28/21 0342 06/25/21 1119  BNP 59.0 348.4*   Basic Metabolic Panel: Recent Labs  Lab 06/25/21 2028 06/26/21 0502 06/27/21 0536 06/28/21 0342 06/29/21 0533 06/30/21 0516  NA 139 141 144 148* 149* 144  K 5.3* 4.3 4.4 4.3 4.2 3.8  CL 110 111 112* 116* 120* 109  CO2 18* 21* GLUCOSE 205* 249* 135* 80 119* 165*  BUN 29* 29* 29* 28* 25* 22  CREATININE 2.49* 1.89* 1.49* 1.16* 0.97 0.94  CALCIUM 7.3* 7.2* 7.3* 8.0* 8.4* 8.1*  MG 1.8 1.9 1.9 2.2 2.0 1.8  PHOS 4.5 3.2 2.7 2.3*  --   --    Liver Function Tests: Recent Labs  Lab 06/25/21 1119  AST 84*  ALT 32  ALKPHOS 52  BILITOT 0.5  PROT 5.9*  ALBUMIN 2.5*   No results for input(s): "LIPASE", "AMYLASE" in the last 168 hours. No results for input(s): "AMMONIA" in the last 168 hours. CBC: Recent Labs  Lab 06/25/21 1119 06/26/21 0502 06/27/21 0536 06/28/21 0342 06/29/21 0533 06/30/21 0516  WBC 52.2* 50.3* 49.9* 41.9* 13.7* 8.2  NEUTROABS 46.3*  --   --   --   --   --  HGB 9.8* 9.0* 8.8* 9.1* 8.8* 8.8*  HCT 33.5* 28.6* 29.2* 30.7* 29.7* 29.4*  MCV 105.7* 100.4* 102.1* 103.0* 101.7* 101.0*  PLT 91* 59* 47* 53* 54* 47*   Cardiac Enzymes: No results for input(s): "CKTOTAL", "CKMB", "CKMBINDEX", "TROPONINI" in the last 168 hours. BNP: Invalid input(s): "POCBNP" CBG: Recent Labs  Lab 06/29/21 0816 06/29/21 1152 06/29/21 1603 06/29/21 2056 06/30/21 0827  GLUCAP 114* 143* 113* 157* 125*   D-Dimer No results for input(s): "DDIMER" in the last 72 hours. Hgb A1c No results for input(s): "HGBA1C" in the last 72 hours. Lipid Profile No results for input(s): "CHOL", "HDL", "LDLCALC", "TRIG", "CHOLHDL", "LDLDIRECT" in the last 72 hours. Thyroid function studies No results for input(s): "TSH", "T4TOTAL", "T3FREE", "THYROIDAB" in the last 72  hours.  Invalid input(s): "FREET3" Anemia work up No results for input(s): "VITAMINB12", "FOLATE", "FERRITIN", "TIBC", "IRON", "RETICCTPCT" in the last 72 hours. Urinalysis    Component Value Date/Time   COLORURINE RED (A) 06/25/2021 1335   APPEARANCEUR CLOUDY (A) 06/25/2021 1335   APPEARANCEUR CLEAR 02/22/2013 2300   LABSPEC 1.027 06/25/2021 1335   LABSPEC 1.013 02/22/2013 2300   PHURINE  06/25/2021 1335    TEST NOT REPORTED DUE TO COLOR INTERFERENCE OF URINE PIGMENT   GLUCOSEU (A) 06/25/2021 1335    TEST NOT REPORTED DUE TO COLOR INTERFERENCE OF URINE PIGMENT   GLUCOSEU NEGATIVE 02/22/2013 2300   HGBUR (A) 06/25/2021 1335    TEST NOT REPORTED DUE TO COLOR INTERFERENCE OF URINE PIGMENT   BILIRUBINUR (A) 06/25/2021 1335    TEST NOT REPORTED DUE TO COLOR INTERFERENCE OF URINE PIGMENT   BILIRUBINUR NEGATIVE 02/22/2013 2300   KETONESUR (A) 06/25/2021 1335    TEST NOT REPORTED DUE TO COLOR INTERFERENCE OF URINE PIGMENT   PROTEINUR (A) 06/25/2021 1335    TEST NOT REPORTED DUE TO COLOR INTERFERENCE OF URINE PIGMENT   NITRITE (A) 06/25/2021 1335    TEST NOT REPORTED DUE TO COLOR INTERFERENCE OF URINE PIGMENT   LEUKOCYTESUR (A) 06/25/2021 1335    TEST NOT REPORTED DUE TO COLOR INTERFERENCE OF URINE PIGMENT   LEUKOCYTESUR NEGATIVE 02/22/2013 2300   Sepsis Labs Recent Labs  Lab 06/27/21 0536 06/28/21 0342 06/29/21 0533 06/30/21 0516  WBC 49.9* 41.9* 13.7* 8.2   Microbiology Recent Results (from the past 240 hour(s))  Resp Panel by RT-PCR (Flu A&B, Covid) Anterior Nasal Swab     Status: None   Collection Time: 06/25/21 11:19 AM   Specimen: Anterior Nasal Swab  Result Value Ref Range Status   SARS Coronavirus 2 by RT PCR NEGATIVE NEGATIVE Final    Comment: (NOTE) SARS-CoV-2 target nucleic acids are NOT DETECTED.  The SARS-CoV-2 RNA is generally detectable in upper respiratory specimens during the acute phase of infection. The lowest concentration of SARS-CoV-2 viral copies  this assay can detect is 138 copies/mL. A negative result does not preclude SARS-Cov-2 infection and should not be used as the sole basis for treatment or other patient management decisions. A negative result may occur with  improper specimen collection/handling, submission of specimen other than nasopharyngeal swab, presence of viral mutation(s) within the areas targeted by this assay, and inadequate number of viral copies(<138 copies/mL). A negative result must be combined with clinical observations, patient history, and epidemiological information. The expected result is Negative.  Fact Sheet for Patients:  BloggerCourse.com  Fact Sheet for Healthcare Providers:  SeriousBroker.it  This test is no t yet approved or cleared by the [place] and  has been authorized  for detection and/or diagnosis of SARS-CoV-2 by FDA under an Emergency Use Authorization (EUA). This EUA will remain  in effect (meaning this test can be used) for the duration of the COVID-19 declaration under Section 564(b)(1) of the Act, 21 U.S.C.section 360bbb-3(b)(1), unless the authorization is terminated  or revoked sooner.       Influenza A by PCR NEGATIVE NEGATIVE Final   Influenza B by PCR NEGATIVE NEGATIVE Final    Comment: (NOTE) The Xpert Xpress SARS-CoV-2/FLU/RSV plus assay is intended as an aid in the diagnosis of influenza from Nasopharyngeal swab specimens and should not be used as a sole basis for treatment. Nasal washings and aspirates are unacceptable for Xpert Xpress SARS-CoV-2/FLU/RSV testing.  Fact Sheet for Patients: BloggerCourse.com  Fact Sheet for Healthcare Providers: SeriousBroker.it  This test is not yet approved or cleared by the [place] FDA and has been authorized for detection and/or diagnosis of SARS-CoV-2 by FDA under an Emergency Use Authorization (EUA). This EUA  will remain in effect (meaning this test can be used) for the duration of the COVID-19 declaration under Section 564(b)(1) of the Act, 21 U.S.C. section 360bbb-3(b)(1), unless the authorization is terminated or revoked.  Performed at [facility], [address] [place], [place] [address]   Blood Culture (routine x 2)     Status: Abnormal   Collection Time: 06/25/21 11:20 AM   Specimen: BLOOD RIGHT HAND  Result Value Ref Range Status   Specimen Description   Final    BLOOD RIGHT HAND Performed at [facility], [address] [place], [place] [address]    Special Requests   Final    BOTTLES DRAWN AEROBIC AND ANAEROBIC Blood Culture results may not be optimal due to an inadequate volume of blood received in culture bottles Performed at [facility], [address] [place], [place] [address]    Culture  Setup Time   Final    IN BOTH AEROBIC AND ANAEROBIC BOTTLES GRAM NEGATIVE RODS CRITICAL RESULT CALLED TO, READ BACK BY AND VERIFIED WITH: RODNEY GRUBB 06/26/21 @ 0907 BY SB    Culture ESCHERICHIA COLI (A)  Final   Report Status 06/28/2021 FINAL  Final   Organism ID, Bacteria ESCHERICHIA COLI  Final      Susceptibility   Escherichia coli - MIC*    AMPICILLIN >=32 RESISTANT Resistant     CEFAZOLIN <=4 SENSITIVE Sensitive     CEFEPIME <=0.12 SENSITIVE Sensitive     CEFTAZIDIME <=1 SENSITIVE Sensitive     CEFTRIAXONE <=0.25 SENSITIVE Sensitive     CIPROFLOXACIN >=4 RESISTANT Resistant     GENTAMICIN <=1 SENSITIVE Sensitive     IMIPENEM <=0.25 SENSITIVE Sensitive     TRIMETH/SULFA <=20 SENSITIVE Sensitive     AMPICILLIN/SULBACTAM 8 SENSITIVE Sensitive     PIP/TAZO <=4 SENSITIVE Sensitive     * ESCHERICHIA COLI  Blood Culture ID Panel (Reflexed)     Status: Abnormal   Collection Time: 06/25/21 11:20 AM  Result Value Ref Range Status   Enterococcus faecalis NOT DETECTED NOT DETECTED Final   Enterococcus Faecium NOT DETECTED NOT DETECTED Final    Listeria monocytogenes NOT DETECTED NOT DETECTED Final   Staphylococcus species NOT DETECTED NOT DETECTED Final   Staphylococcus aureus (BCID) NOT DETECTED NOT DETECTED Final   Staphylococcus epidermidis NOT DETECTED NOT DETECTED Final   Staphylococcus lugdunensis NOT DETECTED NOT DETECTED Final   Streptococcus species NOT DETECTED NOT DETECTED Final   Streptococcus agalactiae NOT DETECTED NOT DETECTED Final  Streptococcus pneumoniae NOT DETECTED NOT DETECTED Final   Streptococcus pyogenes NOT DETECTED NOT DETECTED Final   A.calcoaceticus-baumannii NOT DETECTED NOT DETECTED Final   Bacteroides fragilis NOT DETECTED NOT DETECTED Final   Enterobacterales DETECTED (A) NOT DETECTED Final    Comment: Enterobacterales represent a large order of gram negative bacteria, not a single organism. CRITICAL RESULT CALLED TO, READ BACK BY AND VERIFIED WITH: RODNEY GRUBB 06/26/21 @ 0907 BY SB    Enterobacter cloacae complex NOT DETECTED NOT DETECTED Final   Escherichia coli DETECTED (A) NOT DETECTED Final    Comment: CRITICAL RESULT CALLED TO, READ BACK BY AND VERIFIED WITH: RODNEY GRUBB 06/26/21 @ 0907 BY SB    Klebsiella aerogenes NOT DETECTED NOT DETECTED Final   Klebsiella oxytoca NOT DETECTED NOT DETECTED Final   Klebsiella pneumoniae NOT DETECTED NOT DETECTED Final   Proteus species NOT DETECTED NOT DETECTED Final   Salmonella species NOT DETECTED NOT DETECTED Final   Serratia marcescens NOT DETECTED NOT DETECTED Final   Haemophilus influenzae NOT DETECTED NOT DETECTED Final   Neisseria meningitidis NOT DETECTED NOT DETECTED Final   Pseudomonas aeruginosa NOT DETECTED NOT DETECTED Final   Stenotrophomonas maltophilia NOT DETECTED NOT DETECTED Final   Candida albicans NOT DETECTED NOT DETECTED Final   Candida auris NOT DETECTED NOT DETECTED Final   Candida glabrata NOT DETECTED NOT DETECTED Final   Candida krusei NOT DETECTED NOT DETECTED Final   Candida parapsilosis NOT DETECTED NOT DETECTED  Final   Candida tropicalis NOT DETECTED NOT DETECTED Final   Cryptococcus neoformans/gattii NOT DETECTED NOT DETECTED Final   CTX-M ESBL NOT DETECTED NOT DETECTED Final   Carbapenem resistance IMP NOT DETECTED NOT DETECTED Final   Carbapenem resistance KPC NOT DETECTED NOT DETECTED Final   Carbapenem resistance NDM NOT DETECTED NOT DETECTED Final   Carbapenem resist OXA 48 LIKE NOT DETECTED NOT DETECTED Final   Carbapenem resistance VIM NOT DETECTED NOT DETECTED Final    Comment: Performed at [facility], [address] [place], [place] [address]  Blood Culture (routine x 2)     Status: None   Collection Time: 06/25/21 11:21 AM   Specimen: BLOOD  Result Value Ref Range Status   Specimen Description BLOOD RIGHT ANTECUBITAL  Final   Special Requests   Final    BOTTLES DRAWN AEROBIC AND ANAEROBIC Blood Culture results may not be optimal due to an excessive volume of blood received in culture bottles   Culture   Final    NO GROWTH 5 DAYS Performed at [facility], [address] [place], [place] [address]    Report Status 06/30/2021 FINAL  Final  Urine Culture     Status: Abnormal   Collection Time: 06/25/21  1:35 PM   Specimen: In/Out Cath Urine  Result Value Ref Range Status   Specimen Description   Final    IN/OUT CATH URINE Performed at [facility], [address] [place], [place] [address]    Special Requests   Final    NONE Performed at [facility], [address] [place], [place] [address]    Culture 2,000 COLONIES/mL YEAST (A)  Final   Report Status 06/27/2021 FINAL  Final  MRSA Next Gen by PCR, Nasal     Status: None   Collection Time: 06/25/21  2:21 PM   Specimen: Nasal Mucosa; Nasal Swab  Result Value Ref Range Status   MRSA by PCR Next Gen NOT DETECTED NOT DETECTED Final    Comment: (NOTE)  The GeneXpert MRSA Assay (FDA approved for NASAL specimens only), is one component of a comprehensive MRSA colonization  surveillance program. It is not intended to diagnose MRSA infection nor to guide or monitor treatment for MRSA infections. Test performance is not FDA approved in patients less than 74 years old. Performed at [facility], [address], [place], [place] [address]   Aerobic/Anaerobic Culture w Gram Stain (surgical/deep wound)     Status: None (Preliminary result)   Collection Time: 06/25/21  4:46 PM   Specimen: PATH Other; Tissue  Result Value Ref Range Status   Specimen Description   Final    URINE, RANDOM URINE FROM LEFT RENAL PELVIS Performed at [facility], [address] [place], [place] [address]    Special Requests   Final    NONE Performed at [facility], [address] [place], [place] [address]    Gram Stain   Final    RARE WBC PRESENT, PREDOMINANTLY MONONUCLEAR NO ORGANISMS SEEN    Culture   Final    NO GROWTH 3 DAYS NO ANAEROBES ISOLATED; CULTURE IN PROGRESS FOR 5 DAYS Performed at [facility], [address], [place], [place] [address]    Report Status PENDING  Incomplete     Time coordinating discharge: Over 30 minutes  SIGNED:   Tresa Moore, MD  [facility] 06/30/2021, 9:38 AM Pager   If 7PM-7AM, please contact night-coverage

## 2021-06-30 NOTE — Plan of Care (Signed)
Spoke with attending MD regarding patient. PMT will sign off at this time.

## 2021-07-02 LAB — AEROBIC/ANAEROBIC CULTURE W GRAM STAIN (SURGICAL/DEEP WOUND)

## 2021-07-05 ENCOUNTER — Telehealth: Payer: Self-pay

## 2021-07-05 NOTE — Telephone Encounter (Signed)
I spoke with POA [name] and [facility] Contact- Karie Mainland. We have discussed possible surgery dates and Friday July 7th, 2023 was agreed upon by all parties. Patient given information about surgery date, what to expect pre-operatively and post operatively.  We discussed that a Pre-Admission Testing office will be calling to set up the pre-op visit that will take place prior to surgery, and that these appointments are typically done over the phone with a Pre-Admissions RN.  Informed patient that our office will communicate any additional care to be provided after surgery. Patients questions or concerns were discussed during our call. Advised to call our office should there be any additional information, questions or concerns that arise. Patient verbalized understanding.

## 2021-07-05 NOTE — Progress Notes (Signed)
[facility] Posting Form   Surgery Date/Time: Date: 07/22/2021  Surgeon: Dr. Legrand Rams, MD  Surgery Location: Day Surgery  Inpt ( No  )   Outpt (Yes)   Obs ( No  )   Diagnosis: N20.0 Left Nephrolithiasis  -CPT: 61537  Surgery: Left Ureteroscopy with laser lithotripsy and stent exchange  Stop Anticoagulations: No and may continue ASA  Cardiac/Medical/Pulmonary Clearance needed: no  *Orders entered into EPIC  Date: 07/05/21   *Case booked in EPIC  Date: 07/05/21  *Notified pt of Surgery: Date: 07/05/21  PRE-OP UA & CX: yes, will need to be obtain at facility 10-14 days prior to surgery   *Placed into Prior Authorization Work [place] Date: 07/05/21   Assistant/laser/rep:No

## 2021-07-13 ENCOUNTER — Inpatient Hospital Stay
Admission: RE | Admit: 2021-07-13 | Discharge: 2021-07-13 | Disposition: A | Discharge status: Home / Self Care | Payer: Medicare (Managed Care) | Source: Ambulatory Visit

## 2021-07-13 ENCOUNTER — Inpatient Hospital Stay: Admission: RE | Admit: 2021-07-13 | Payer: Medicare (Managed Care) | Source: Ambulatory Visit

## 2021-07-13 NOTE — Pre-Procedure Instructions (Signed)
Patient is under [facility] care in [place], [place] and a has a LEGAL GUARDIAN, Hazle Quant.  I reached out to [facility] and notified them that we need BMP and CBC lab work done. They will call the Pre-Admission office tomorrow if they need order for Lab work. I am unable to connect with the legal guardian because her voicemail is full and cannot accept messages. We will attempt to reach out to her tomorrow.

## 2021-07-13 NOTE — Patient Instructions (Signed)
Your procedure is scheduled on: 07/22/2021  Report to the Registration Desk on the 1st floor of the Medical Mall. To find out your arrival time, please call [phone] between 1PM - 3PM on: 07/21/2021  If your arrival time is 6:00 am, do not arrive prior to that time as the Medical Mall entrance doors do not open until 6:00 am.  REMEMBER: Instructions that are not followed completely may result in serious medical risk, up to and including death; or upon the discretion of your surgeon and anesthesiologist your surgery may need to be rescheduled.  Do not eat food and drink after midnight the night before surgery.  No gum chewing, lozengers or hard candies.   TAKE THESE MEDICATIONS THE MORNING OF SURGERY WITH A SIP OF WATER: Keppra levothyroxine  omeprazole (PRILOSEC)     Use Symbicort inhaler on day of surgery as prescribed. Use albuterol nebulizer as prescribed on day of surgery.   **Follow new guidelines for insulin and diabetes medications.**  Do not take Jardiance on 7/4,7/5,7/6 and 7/7 (actual procedure Day)  Follow recommendations from Cardiologist, Pulmonologist or PCP regarding stopping Aspirin.  One week prior to surgery: Stop Anti-inflammatories (NSAIDS) such as Advil, Aleve, Ibuprofen, Motrin, Naproxen, Naprosyn and Aspirin based products such as Excedrin, Goodys Powder, BC Powder. Stop ANY OVER THE COUNTER supplements until after surgery like Vit D, B-12, You may however, continue to take Tylenol if needed for pain up until the day of surgery.  No Alcohol for 24 hours before or after surgery.  No Smoking including e-cigarettes for 24 hours prior to surgery.  No chewable tobacco products for at least 6 hours prior to surgery.  No nicotine patches on the day of surgery.  Do not use any "recreational" drugs for at least a week prior to your surgery.  Please be advised that the combination of cocaine and anesthesia may have negative outcomes, up to and including  death. If you test positive for cocaine, your surgery will be cancelled.  On the morning of surgery brush your teeth with toothpaste and water, you may rinse your mouth with mouthwash if you wish. Do not swallow any toothpaste or mouthwash.  Use CHG Soap or wipes as directed on instruction sheet.  Do not wear jewelry, make-up, hairpins, clips or nail polish.  Do not wear lotions, powders, or perfumes.   Do not shave body from the neck down 48 hours prior to surgery just in case you cut yourself which could leave a site for infection.  Also, freshly shaved skin may become irritated if using the CHG soap.  Contact lenses, hearing aids and dentures may not be worn into surgery.  Do not bring valuables to the hospital. [facility] is not responsible for any missing/lost belongings or valuables.   Total Shoulder Arthroplasty:  use Benzolyl Peroxide 5% Gel as directed on instruction sheet.  Fleets enema or bowel prep as directed.  Bring your C-PAP to the hospital with you in case you may have to spend the night.   Notify your doctor if there is any change in your medical condition (cold, fever, infection).  Wear comfortable clothing (specific to your surgery type) to the hospital.  After surgery, you can help prevent lung complications by doing breathing exercises.  Take deep breaths and cough every 1-2 hours. Your doctor may order a device called an Incentive Spirometer to help you take deep breaths. When coughing or sneezing, hold a pillow firmly against your incision with both hands. This is  called "splinting." Doing this helps protect your incision. It also decreases belly discomfort.  If you are being admitted to the hospital overnight, leave your suitcase in the car. After surgery it may be brought to your room.  If you are being discharged the day of surgery, you will not be allowed to drive home. You will need a responsible adult (18 years or older) to drive you home and stay  with you that night.   If you are taking public transportation, you will need to have a responsible adult (18 years or older) with you. Please confirm with your physician that it is acceptable to use public transportation.   Please call the Pre-admissions Testing Dept. at [phone] if you have any questions about these instructions.  Surgery Visitation Policy:  Patients undergoing a surgery or procedure may have two family members or support persons with them as long as the person is not COVID-19 positive or experiencing its symptoms.   Inpatient Visitation:    Visiting hours are 7 a.m. to 8 p.m. Up to four visitors are allowed at one time in a patient room, including children. The visitors may rotate out with other people during the day. One designated support person (adult) may remain overnight.

## 2021-07-14 ENCOUNTER — Encounter
Admission: RE | Admit: 2021-07-14 | Discharge: 2021-07-14 | Disposition: A | Discharge status: Home / Self Care | Payer: Medicare (Managed Care) | Source: Ambulatory Visit | Attending: Urology | Admitting: Urology

## 2021-07-14 HISTORY — DX: Vitamin D deficiency, unspecified: E55.9

## 2021-07-14 HISTORY — DX: Phonological disorder: F80.0

## 2021-07-14 HISTORY — DX: Dementia in other diseases classified elsewhere, unspecified severity, without behavioral disturbance, psychotic disturbance, mood disturbance, and anxiety: F01.50

## 2021-07-14 HISTORY — DX: Other cervical disc degeneration, unspecified cervical region: M50.30

## 2021-07-14 HISTORY — DX: Constipation, unspecified: K59.00

## 2021-07-14 HISTORY — DX: Barrett's esophagus without dysplasia: K22.70

## 2021-07-14 HISTORY — DX: Spinal stenosis, lumbar region without neurogenic claudication: M48.061

## 2021-07-14 HISTORY — DX: Cardiac arrhythmia, unspecified: I49.9

## 2021-07-14 HISTORY — DX: Fatty (change of) liver, not elsewhere classified: K76.0

## 2021-07-14 HISTORY — DX: Vascular dementia, unspecified severity, without behavioral disturbance, psychotic disturbance, mood disturbance, and anxiety: F02.80

## 2021-07-14 HISTORY — DX: Bradycardia, unspecified: R00.1

## 2021-07-14 NOTE — Patient Instructions (Addendum)
Your procedure is scheduled on: 07/22/21 - Friday , arrive at 11:00 am. Report to the Registration Desk on the 1st floor of the Medical Mall. If your arrival time is 6:00 am, do not arrive prior to that time as the Medical Mall entrance doors do not open until 6:00 am.  REMEMBER: Instructions that are not followed completely may result in serious medical risk, up to and including death; or upon the discretion of your surgeon and anesthesiologist your surgery may need to be rescheduled.  Do not eat food or drink any fluids after midnight the night before surgery.  No gum chewing, lozengers or hard candies.  TAKE THESE MEDICATIONS THE MORNING OF SURGERY WITH A SIP OF WATER:  - formoterol (PERFOROMIST) 20 MCG/2ML nebulizer solution - levETIRAcetam (KEPPRA) 250 MG tablet - levETIRAcetam (KEPPRA) 500 MG tablet - levothyroxine (SYNTHROID) 100 MCG tablet - omeprazole (PRILOSEC) 20 MG capsule, (take one the night before and one on the morning of surgery - helps to prevent nausea after surgery.) - albuterol (PROVENTIL) (2.5 MG/3ML) 0.083% nebulizer solution  May continue taking aspirin EC 81 MG tablet but do not take the morning of surgery.  One week prior to surgery: Stop Anti-inflammatories (NSAIDS) such as Advil, Aleve, Ibuprofen, Motrin, Naproxen, Naprosyn and Aspirin based products such as Excedrin, Goodys Powder, BC Powder.  Stop ANY OVER THE COUNTER supplements until after surgery.  You may however, continue to take Tylenol if needed for pain up until the day of surgery.  No Alcohol for 24 hours before or after surgery.  No Smoking including e-cigarettes for 24 hours prior to surgery.  No chewable tobacco products for at least 6 hours prior to surgery.  No nicotine patches on the day of surgery.  Do not use any "recreational" drugs for at least a week prior to your surgery.  Please be advised that the combination of cocaine and anesthesia may have negative outcomes, up to and  including death. If you test positive for cocaine, your surgery will be cancelled.  On the morning of surgery brush your teeth with toothpaste and water, you may rinse your mouth with mouthwash if you wish. Do not swallow any toothpaste or mouthwash.  Do not wear jewelry, make-up, hairpins, clips or nail polish.  Do not wear lotions, powders, or perfumes.   Do not shave body from the neck down 48 hours prior to surgery just in case you cut yourself which could leave a site for infection.  Also, freshly shaved skin may become irritated if using the CHG soap.  Contact lenses, hearing aids and dentures may not be worn into surgery.  Do not bring valuables to the hospital. [facility] is not responsible for any missing/lost belongings or valuables.   Notify your doctor if there is any change in your medical condition (cold, fever, infection).  Wear comfortable clothing (specific to your surgery type) to the hospital.  After surgery, you can help prevent lung complications by doing breathing exercises.  Take deep breaths and cough every 1-2 hours. Your doctor may order a device called an Incentive Spirometer to help you take deep breaths. When coughing or sneezing, hold a pillow firmly against your incision with both hands. This is called "splinting." Doing this helps protect your incision. It also decreases belly discomfort.  If you are being admitted to the hospital overnight, leave your suitcase in the car. After surgery it may be brought to your room.  If you are being discharged the day of surgery, you  will not be allowed to drive home. You will need a responsible adult (18 years or older) to drive you home and stay with you that night.   If you are taking public transportation, you will need to have a responsible adult (18 years or older) with you. Please confirm with your physician that it is acceptable to use public transportation.   Please call the Pre-admissions Testing Dept.  at [phone] if you have any questions about these instructions.  Surgery Visitation Policy:  Patients undergoing a surgery or procedure may have two family members or support persons with them as long as the person is not COVID-19 positive or experiencing its symptoms.   Inpatient Visitation:    Visiting hours are 7 a.m. to 8 p.m. Up to four visitors are allowed at one time in a patient room, including children. The visitors may rotate out with other people during the day. One designated support person (adult) may remain overnight.

## 2021-07-20 ENCOUNTER — Other Ambulatory Visit: Payer: Self-pay

## 2021-07-21 MED ORDER — CEFAZOLIN SODIUM-DEXTROSE 2-4 GM/100ML-% IV SOLN
2.0000 g | INTRAVENOUS | Status: AC
  Administered 2021-07-22: 2 g via INTRAVENOUS

## 2021-07-21 MED ORDER — LACTATED RINGERS IV SOLN: INTRAVENOUS | Status: DC

## 2021-07-21 MED ORDER — ORAL CARE MOUTH RINSE: 15.0000 mL | Freq: Once | OROMUCOSAL | Status: AC

## 2021-07-21 MED ORDER — CHLORHEXIDINE GLUCONATE 0.12 % MT SOLN: 15.0000 mL | Freq: Once | OROMUCOSAL | Status: AC

## 2021-07-21 MED ORDER — FLUCONAZOLE 100MG IVPB
100.0000 mg | INTRAVENOUS | Status: DC
  Administered 2021-07-22: 100 mg via INTRAVENOUS
  Filled 2021-07-21 (×2): qty 50

## 2021-07-22 ENCOUNTER — Other Ambulatory Visit: Payer: Self-pay

## 2021-07-22 ENCOUNTER — Encounter: Payer: Self-pay | Admitting: Urology

## 2021-07-22 ENCOUNTER — Ambulatory Visit: Payer: Medicare (Managed Care) | Admitting: Urgent Care

## 2021-07-22 ENCOUNTER — Ambulatory Visit: Payer: Medicare (Managed Care) | Admitting: Certified Registered"

## 2021-07-22 ENCOUNTER — Encounter
Admission: RE | Disposition: A | Discharge status: Home / Self Care | Payer: Self-pay | Source: Home / Self Care | Attending: Urology

## 2021-07-22 ENCOUNTER — Ambulatory Visit
Admission: RE | Admit: 2021-07-22 | Discharge: 2021-07-22 | Disposition: A | Discharge status: Home / Self Care | Payer: Medicare (Managed Care) | Attending: Urology | Admitting: Urology

## 2021-07-22 ENCOUNTER — Ambulatory Visit: Payer: Medicare (Managed Care)

## 2021-07-22 DIAGNOSIS — K219 Gastro-esophageal reflux disease without esophagitis: Secondary | ICD-10-CM | POA: Insufficient documentation

## 2021-07-22 DIAGNOSIS — I5030 Unspecified diastolic (congestive) heart failure: Secondary | ICD-10-CM | POA: Diagnosis not present

## 2021-07-22 DIAGNOSIS — R7303 Prediabetes: Secondary | ICD-10-CM | POA: Diagnosis not present

## 2021-07-22 DIAGNOSIS — N202 Calculus of kidney with calculus of ureter: Secondary | ICD-10-CM | POA: Diagnosis not present

## 2021-07-22 DIAGNOSIS — Z6832 Body mass index (BMI) 32.0-32.9, adult: Secondary | ICD-10-CM | POA: Diagnosis not present

## 2021-07-22 DIAGNOSIS — Q909 Down syndrome, unspecified: Secondary | ICD-10-CM | POA: Diagnosis not present

## 2021-07-22 DIAGNOSIS — N200 Calculus of kidney: Secondary | ICD-10-CM

## 2021-07-22 DIAGNOSIS — E669 Obesity, unspecified: Secondary | ICD-10-CM | POA: Insufficient documentation

## 2021-07-22 HISTORY — PX: CYSTOSCOPY/URETEROSCOPY/HOLMIUM LASER/STENT PLACEMENT: SHX6546

## 2021-07-22 LAB — GLUCOSE, CAPILLARY
Glucose-Capillary: 77 mg/dL (ref 70–99)
Glucose-Capillary: 138 mg/dL — ABNORMAL HIGH (ref 70–99)

## 2021-07-22 SURGERY — CYSTOSCOPY/URETEROSCOPY/HOLMIUM LASER/STENT PLACEMENT
Anesthesia: General | Site: Ureter | Laterality: Left

## 2021-07-22 MED ORDER — DEXAMETHASONE SODIUM PHOSPHATE 10 MG/ML IJ SOLN
INTRAMUSCULAR | Status: DC | PRN
  Administered 2021-07-22: 8 mg via INTRAVENOUS

## 2021-07-22 MED ORDER — ROCURONIUM BROMIDE 100 MG/10ML IV SOLN
INTRAVENOUS | Status: DC | PRN
  Administered 2021-07-22: 50 mg via INTRAVENOUS

## 2021-07-22 MED ORDER — LIDOCAINE HCL (CARDIAC) PF 100 MG/5ML IV SOSY
PREFILLED_SYRINGE | INTRAVENOUS | Status: DC | PRN
  Administered 2021-07-22: 100 mg via INTRAVENOUS

## 2021-07-22 MED ORDER — FENTANYL CITRATE (PF) 100 MCG/2ML IJ SOLN
INTRAMUSCULAR | Status: AC
  Filled 2021-07-22: qty 2

## 2021-07-22 MED ORDER — CHLORHEXIDINE GLUCONATE 0.12 % MT SOLN
OROMUCOSAL | Status: AC
  Administered 2021-07-22: 15 mL via OROMUCOSAL
  Filled 2021-07-22: qty 15

## 2021-07-22 MED ORDER — LIDOCAINE HCL (PF) 1 % IJ SOLN
INTRAMUSCULAR | Status: AC
  Filled 2021-07-22: qty 5

## 2021-07-22 MED ORDER — GLYCOPYRROLATE 0.2 MG/ML IJ SOLN
INTRAMUSCULAR | Status: DC | PRN
  Administered 2021-07-22: .2 mg via INTRAVENOUS

## 2021-07-22 MED ORDER — DEXTROSE 50 % IV SOLN
INTRAVENOUS | Status: AC
  Administered 2021-07-22: 12.5 g via INTRAVENOUS
  Filled 2021-07-22: qty 50

## 2021-07-22 MED ORDER — IOHEXOL 180 MG/ML  SOLN
INTRAMUSCULAR | Status: DC | PRN
  Administered 2021-07-22: 10 mL

## 2021-07-22 MED ORDER — PHENYLEPHRINE HCL-NACL 20-0.9 MG/250ML-% IV SOLN
INTRAVENOUS | Status: DC | PRN
  Administered 2021-07-22: 50 ug/min via INTRAVENOUS

## 2021-07-22 MED ORDER — ONDANSETRON HCL 4 MG/2ML IJ SOLN
INTRAMUSCULAR | Status: DC | PRN
  Administered 2021-07-22: 4 mg via INTRAVENOUS

## 2021-07-22 MED ORDER — SUGAMMADEX SODIUM 200 MG/2ML IV SOLN
INTRAVENOUS | Status: DC | PRN
  Administered 2021-07-22: 200 mg via INTRAVENOUS

## 2021-07-22 MED ORDER — CEFAZOLIN SODIUM-DEXTROSE 2-4 GM/100ML-% IV SOLN
INTRAVENOUS | Status: AC
  Filled 2021-07-22: qty 100

## 2021-07-22 MED ORDER — CEPHALEXIN 500 MG PO CAPS: 500.0000 mg | ORAL_CAPSULE | Freq: Two times a day (BID) | ORAL | 0 refills | Status: DC

## 2021-07-22 MED ORDER — SODIUM CHLORIDE 0.9 % IR SOLN
Status: DC | PRN
  Administered 2021-07-22: 3000 mL via INTRAVESICAL

## 2021-07-22 MED ORDER — FLUCONAZOLE 100 MG PO TABS: 100.0000 mg | ORAL_TABLET | Freq: Every day | ORAL | 0 refills | Status: AC

## 2021-07-22 MED ORDER — DEXTROSE 50 % IV SOLN: 12.5000 g | Freq: Once | INTRAVENOUS | Status: AC

## 2021-07-22 MED ORDER — FENTANYL CITRATE (PF) 100 MCG/2ML IJ SOLN
INTRAMUSCULAR | Status: DC | PRN
Start: 2021-07-22 — End: 2021-07-22
  Administered 2021-07-22: 50 ug via INTRAVENOUS

## 2021-07-22 MED ORDER — PROPOFOL 10 MG/ML IV BOLUS
INTRAVENOUS | Status: DC | PRN
  Administered 2021-07-22: 160 mg via INTRAVENOUS

## 2021-07-22 MED ORDER — PHENYLEPHRINE HCL (PRESSORS) 10 MG/ML IV SOLN
INTRAVENOUS | Status: DC | PRN
  Administered 2021-07-22 (×2): 80 ug via INTRAVENOUS

## 2021-07-22 MED ORDER — EPHEDRINE SULFATE (PRESSORS) 50 MG/ML IJ SOLN
INTRAMUSCULAR | Status: DC | PRN
  Administered 2021-07-22: 10 mg via INTRAVENOUS

## 2021-07-22 SURGICAL SUPPLY — 34 items
ADH LQ OCL WTPRF AMP STRL LF (MISCELLANEOUS) ×1
ADHESIVE MASTISOL STRL (MISCELLANEOUS) ×1 IMPLANT
BAG DRAIN CYSTO-URO LG1000N (MISCELLANEOUS) ×2 IMPLANT
BAG PRESSURE INF REUSE 3000 (BAG) ×2 IMPLANT
BASKET NITINOL 4 WIRE 16 (BASKET) ×1 IMPLANT
BSKT STON RTRVL 120X16 3FR (BASKET) ×1
CATH URET FLEX-TIP 2 LUMEN 10F (CATHETERS) IMPLANT
CATH URETL OPEN 5X70 (CATHETERS) IMPLANT
CNTNR SPEC 2.5X3XGRAD LEK (MISCELLANEOUS)
CONT SPEC 4OZ STER OR WHT (MISCELLANEOUS)
CONT SPEC 4OZ STRL OR WHT (MISCELLANEOUS)
CONTAINER SPEC 2.5X3XGRAD LEK (MISCELLANEOUS) IMPLANT
DRAPE UTILITY 15X26 TOWEL STRL (DRAPES) ×2 IMPLANT
DRSG TEGADERM 2-3/8X2-3/4 SM (GAUZE/BANDAGES/DRESSINGS) ×1 IMPLANT
GLOVE SURG UNDER POLY LF SZ7.5 (GLOVE) ×2 IMPLANT
GOWN STRL REUS W/ TWL LRG LVL3 (GOWN DISPOSABLE) ×1 IMPLANT
GOWN STRL REUS W/ TWL XL LVL3 (GOWN DISPOSABLE) ×1 IMPLANT
GOWN STRL REUS W/TWL LRG LVL3 (GOWN DISPOSABLE) ×2
GOWN STRL REUS W/TWL XL LVL3 (GOWN DISPOSABLE) ×2
GUIDEWIRE STR DUAL SENSOR (WIRE) ×2 IMPLANT
IV NS IRRIG 3000ML ARTHROMATIC (IV SOLUTION) ×2 IMPLANT
KIT TURNOVER CYSTO (KITS) ×2 IMPLANT
PACK CYSTO AR (MISCELLANEOUS) ×2 IMPLANT
SET CYSTO W/LG BORE CLAMP LF (SET/KITS/TRAYS/PACK) ×2 IMPLANT
SHEATH NAVIGATOR HD 12/14X36 (SHEATH) IMPLANT
STENT URET 6FRX22 CONTOUR (STENTS) ×1 IMPLANT
STENT URET 6FRX24 CONTOUR (STENTS) IMPLANT
STENT URET 6FRX26 CONTOUR (STENTS) IMPLANT
SURGILUBE 2OZ TUBE FLIPTOP (MISCELLANEOUS) ×2 IMPLANT
SYR 10ML LL (SYRINGE) ×2 IMPLANT
TRACTIP FLEXIVA PULS ID 200XHI (Laser) IMPLANT
TRACTIP FLEXIVA PULSE ID 200 (Laser) ×2 IMPLANT
VALVE UROSEAL ADJ ENDO (VALVE) ×1 IMPLANT
WATER STERILE IRR 500ML POUR (IV SOLUTION) ×2 IMPLANT

## 2021-07-22 NOTE — Transfer of Care (Signed)
Immediate Anesthesia Transfer of Care Note  Patient: Lori Petersen [facility]  Procedure(s) Performed: CYSTOSCOPY/URETEROSCOPY/HOLMIUM LASER/STENT PLACEMENT (Left: Ureter)  Patient Location: PACU  Anesthesia Type:General  Level of Consciousness: drowsy  Airway & Oxygen Therapy: Patient Spontanous Breathing and Patient connected to face mask oxygen  Post-op Assessment: Report given to RN  Post vital signs: stable  Last Vitals:  Vitals Value Taken Time  BP 93/55 07/22/21 1051  Temp 36.7 C 07/22/21 1051  Pulse 63 07/22/21 1054  Resp 11 07/22/21 1054  SpO2 95 % 07/22/21 1054  Vitals shown include unvalidated device data.  Last Pain:  Vitals:   07/22/21 0833  TempSrc: Temporal         Complications: No notable events documented.

## 2021-07-22 NOTE — Anesthesia Preprocedure Evaluation (Addendum)
Anesthesia Evaluation  Patient identified by MRN, date of birth, ID band Patient confused    Reviewed: Allergy & Precautions, NPO status , Patient's Chart, lab work & pertinent test results  History of Anesthesia Complications Negative for: history of anesthetic complications  Airway Mallampati: IV   Neck ROM: Full    Dental   Pulmonary sleep apnea ,    Pulmonary exam normal breath sounds clear to auscultation       Cardiovascular +CHF (diastolic)  Normal cardiovascular exam Rhythm:Regular Rate:Normal  ECG 06/25/21 (while pt was septic):  Sinus tachycardia Multiform ventricular premature complexes Nonspecific T abnormalities, anterior leads   Neuro/Psych Seizures -,  PSYCHIATRIC DISORDERS (developmental delay) Dementia Down syndrome    GI/Hepatic GERD (Barrett esophagus)  ,Fatty liver   Endo/Other  Obesity; prediabetes  Renal/GU Renal disease (nephrolithiasis)     Musculoskeletal   Abdominal   Peds  Hematology negative hematology ROS (+)   Anesthesia Other Findings   Reproductive/Obstetrics                            Anesthesia Physical Anesthesia Plan  ASA: 3  Anesthesia Plan: General   Post-op Pain Management:    Induction: Intravenous  PONV Risk Score and Plan: 3 and Ondansetron, Dexamethasone and Treatment may vary due to age or medical condition  Airway Management Planned: Oral ETT  Additional Equipment:   Intra-op Plan:   Post-operative Plan: Extubation in OR  Informed Consent: I have reviewed the patients History and Physical, chart, labs and discussed the procedure including the risks, benefits and alternatives for the proposed anesthesia with the patient or authorized representative who has indicated his/her understanding and acceptance.     Dental advisory given and Consent reviewed with POA  Plan Discussed with: CRNA  Anesthesia Plan Comments: (History and  consent obtained from patient's sister and legal guardian, Nicole Cella. Patient's sister consented for risks of anesthesia including but not limited to:  - adverse reactions to medications - damage to eyes, teeth, lips or other oral mucosa - nerve damage due to positioning  - sore throat or hoarseness - damage to heart, brain, nerves, lungs, other parts of body or loss of life  Patient's sister voiced understanding.)       Anesthesia Quick Evaluation

## 2021-07-22 NOTE — Anesthesia Postprocedure Evaluation (Signed)
Anesthesia Post Note  Patient: Lori Petersen [facility]  Procedure(s) Performed: CYSTOSCOPY/URETEROSCOPY/HOLMIUM LASER/STENT PLACEMENT (Left: Ureter)  Patient location during evaluation: PACU Anesthesia Type: General Level of consciousness: awake and alert, oriented and patient cooperative Pain management: pain level controlled Vital Signs Assessment: post-procedure vital signs reviewed and stable Respiratory status: spontaneous breathing, nonlabored ventilation and respiratory function stable Cardiovascular status: blood pressure returned to baseline and stable Postop Assessment: adequate PO intake Anesthetic complications: no   No notable events documented.   Last Vitals:  Vitals:   07/22/21 1145 07/22/21 1213  BP: 108/66 (!) 114/58  Pulse: 71 71  Resp: 19 20  Temp:  (!) 36.4 C  SpO2: 100% 94%    Last Pain:  Vitals:   07/22/21 1213  TempSrc: Temporal                 Reed Breech

## 2021-07-22 NOTE — Anesthesia Procedure Notes (Signed)
Procedure Name: Intubation Date/Time: 07/22/2021 9:59 AM  Performed by: Jaye Beagle, CRNAPre-anesthesia Checklist: Patient identified, Emergency Drugs available, Suction available and Patient being monitored Patient Re-evaluated:Patient Re-evaluated prior to induction Oxygen Delivery Method: Circle system utilized Preoxygenation: Pre-oxygenation with 100% oxygen Induction Type: IV induction Ventilation: Mask ventilation without difficulty Laryngoscope Size: McGraph and 3 Grade View: Grade I Tube type: Oral Tube size: 7.0 mm Number of attempts: 1 Airway Equipment and Method: Stylet and Oral airway Placement Confirmation: ETT inserted through vocal cords under direct vision, positive ETCO2 and breath sounds checked- equal and bilateral Secured at: 22 cm Tube secured with: Tape Dental Injury: Teeth and Oropharynx as per pre-operative assessment  Comments: Positioned with shoulder bump

## 2021-07-22 NOTE — Discharge Instructions (Signed)

## 2021-07-22 NOTE — H&P (Signed)
07/22/21 9:46 AM   Alben Spittle [facility] [birth year] [id number]  CC: left ureteral stone  HPI: Extremely comorbid 66 year old female with developmental delay and Down syndrome who originally presented in March 2023 with an infected left ureteral stone and sepsis.  She underwent urgent stent placement at that time.  Follow-up was delayed for a number of logistical reasons, but she ultimately underwent left ureteroscopy, laser lithotripsy, and stent placement on 06/24/2021.  Stent was left on a Dangler with concern that she would not tolerate stent removal in clinic awake, but unfortunately she removed her stent 12 hours after surgery and presented the next day with a punctate left distal ureteral fragment and suspected sepsis from urinary source.  She presents today for left ureteroscopy.   PMH: Past Medical History:  Diagnosis Date   AKI (acute kidney injury) (HCC)    Arthritis    lower spine   Asthma    Barrett's esophagus    Bradycardia    Constipation    DDD (degenerative disc disease), cervical    Dementia (HCC)    Diastolic congestive heart failure (HCC)    "only has problems with bad colds"   Down's syndrome    Dysrhythmia    long QT disorder,   Fatty liver    Full dentures    does not wear   GERD (gastroesophageal reflux disease)    barrett's   Glaucoma    History of kidney stones    HLD (hyperlipidemia)    Hypothyroidism    Mixed dementia (HCC)    Onychomycosis    OSA (obstructive sleep apnea)    Pneumonia 05/17/2021   Pre-diabetes    Pressure ulcers of skin of multiple topographic sites 2023   Respiratory failure (HCC)    Seizures (HCC)    Sepsis (HCC) 05/17/2021   Speech articulation disorder    Spinal stenosis of lumbar region    Thrombocytopenia (HCC)    UTI (urinary tract infection) 05/17/2021   Vitamin D deficiency     Surgical History: Past Surgical History:  Procedure Laterality Date   BREAST BIOPSY Left 04/05/2016   neg. cylinder clip   BREAST  BIOPSY Left 12/06/2016   ribbon clip. path pending   COLONOSCOPY WITH PROPOFOL N/A 04/24/2016   Procedure: COLONOSCOPY WITH PROPOFOL;  Surgeon: Midge Minium, MD;  Location: [facility];  Service: Endoscopy;  Laterality: N/A;  Leave patient at 10:25 due to transportation   CYSTOSCOPY WITH RETROGRADE PYELOGRAM, URETEROSCOPY AND STENT PLACEMENT Left 06/25/2021   Procedure: CYSTOSCOPY WITH RETROGRADE PYELOGRAM, URETEROSCOPY AND STENT PLACEMENT;  Surgeon: Riki Altes, MD;  Location: [facility];  Service: Urology;  Laterality: Left;   CYSTOSCOPY WITH STENT PLACEMENT Left 03/24/2021   Procedure: CYSTOSCOPY WITH STENT PLACEMENT;  Surgeon: Sondra Come, MD;  Location: [facility];  Service: Urology;  Laterality: Left;   CYSTOSCOPY/URETEROSCOPY/HOLMIUM LASER/STENT PLACEMENT Left 06/24/2021   Procedure: CYSTOSCOPY/URETEROSCOPY/HOLMIUM LASER/STENT PLACEMENT;  Surgeon: Sondra Come, MD;  Location: [facility];  Service: Urology;  Laterality: Left;   ESOPHAGOGASTRODUODENOSCOPY (EGD) WITH PROPOFOL N/A 04/24/2016   Procedure: ESOPHAGOGASTRODUODENOSCOPY (EGD) WITH PROPOFOL;  Surgeon: Midge Minium, MD;  Location: [facility];  Service: Endoscopy;  Laterality: N/A;   HERNIA REPAIR      Family History: History reviewed. No pertinent family history.  Social History:  reports that she has never smoked. She has never used smokeless tobacco. She reports that she does not currently use drugs. She reports that she does not drink alcohol.  Physical Exam: BP 97/70  Pulse 76   Temp (!) 97 F (36.1 C) (Temporal)   Resp 18   Ht 5\' 8"  (1.727 m)   SpO2 100%   BMI 32.62 kg/m    Constitutional: at baseline mentation, minimally verbal Cardiovascular: RRR Respiratory: CTA bilaterally GI: Abdomen is soft, nontender, nondistended, no abdominal masses   Laboratory Data: Urine culture 6/30 from facility <10k mixed flora  Assessment & Plan:   66 year old female with developmental delay and a number of  comorbidities here for left ureteroscopy, laser lithotripsy, possible stent placement.  We specifically discussed the risks ureteroscopy including bleeding, infection/sepsis, stent related symptoms including flank pain/urgency/frequency/incontinence/dysuria, ureteral injury, inability to access stone, or need for staged or additional procedures.  Left ureteroscopy, laser lithotripsy, possible stent placement  77, MD 07/22/2021  [facility] [address], [address] [place], [place] [phone]

## 2021-07-22 NOTE — Op Note (Signed)
Date of procedure: 07/22/21  Preoperative diagnosis:  Left ureteral stones Left renal stones  Postoperative diagnosis:  Same  Procedure: Cystoscopy, left retrograde pyelogram, left ureteral stent placement Left ureteroscopy and basket extraction of small left ureteral stones Left ureteroscopy and laser lithotripsy of small left lower pole renal stones  Surgeon: Legrand Rams, MD  Anesthesia: General  Complications: None  Intraoperative findings:  Normal cystoscopy, mild erythema in left distal ureter from indwelling stent Two small punctate stones were basket extracted from the distal ureter Two 70mm stones in the left lower pole were dusted to <61mm fragments Uncomplicated stent placement with Dangler  EBL: None  Specimens: None  Drains: Left 6 French by 22 cm ureteral stent with Dangler  Indication: Fayette Gasner [facility] is a 66 y.o. patient with developmental delay who originally presented in March 2023 with sepsis from urinary source and a left ureteral stone.  She underwent definitive management with left ureteroscopy and laser lithotripsy on 06/24/2021, but unfortunately her stent was removed a few hours after surgery accidentally and she represented the next day with concern for sepsis with CT showing punctate left ureteral stone and underwent urgent stent placement.  Preoperative urine culture on 07/15/2021 was negative.  After reviewing the management options for treatment, they elected to proceed with the above surgical procedure(s). We have discussed the potential benefits and risks of the procedure, side effects of the proposed treatment, the likelihood of the patient achieving the goals of the procedure, and any potential problems that might occur during the procedure or recuperation. Informed consent has been obtained.  Description of procedure:  The patient was taken to the operating room and general anesthesia was induced. SCDs were placed for DVT prophylaxis. The patient was  placed in the dorsal lithotomy position, prepped and draped in the usual sterile fashion, and preoperative antibiotics(Keflex and fluconazole) were administered. A preoperative time-out was performed.   A 21 French rigid cystoscope was used to intubate the urethra and thorough cystoscopy was performed.  There were no suspicious lesions.  The bladder was copiously irrigated.  The left ureteral stent was grasped and pulled to the meatus, and a sensor wire passed into the stent and up to the kidney under fluoroscopic vision.  The semirigid ureteroscope was advanced alongside the wire and there were two small approximately 1 to 2 mm stones in the mid ureter and these were basketed free and deposited in the bladder.  There were no other ureteral stone seen.  A second safety wire was added through the semirigid ureteroscope.  A single channel digital flexible ureteroscope advanced easily over the wire up into the kidney under fluoroscopic vision.  Thorough pyeloscopy revealed a few small 2 mm stones in the left lower pole, and the 200 m laser fiber on settings of 0.5 J and 60 Hz was used to methodically dust the stones to barely visible fragments that were significantly smaller than the laser fiber.  Thorough pyeloscopy revealed no other stone fragments.  A retrograde pyelogram was performed from the proximal ureter that showed no extravasation or filling defects.  Careful pullback ureteroscopy showed no residual ureteral fragments and the ureter was widely patent.  There was only some mild erythema at the distal ureter.  The rigid cystoscope was backloaded over the wire and a 6 [place] by 22 cm stent was uneventfully placed with an excellent curl in the renal pelvis, as well as under direct vision the bladder.  The bladder was drained and this concluded our procedure.  Disposition: Stable to PACU  Plan: Remove stent at facility on Monday Keflex and fluconazole x3 days in setting of recurrent  infections  Legrand Rams, MD

## 2021-07-23 ENCOUNTER — Encounter: Payer: Self-pay | Admitting: Urology

## 2021-08-31 ENCOUNTER — Ambulatory Visit: Payer: Medicare (Managed Care) | Admitting: Internal Medicine

## 2021-09-01 ENCOUNTER — Encounter: Payer: Self-pay | Admitting: Urology

## 2022-03-07 ENCOUNTER — Emergency Department
Admission: EM | Admit: 2022-03-07 | Discharge: 2022-03-07 | Disposition: A | Discharge status: Skilled Nursing Facility | Payer: Medicare (Managed Care) | Attending: Emergency Medicine | Admitting: Emergency Medicine

## 2022-03-07 ENCOUNTER — Emergency Department: Payer: Medicare (Managed Care)

## 2022-03-07 DIAGNOSIS — W19XXXA Unspecified fall, initial encounter: Secondary | ICD-10-CM

## 2022-03-07 DIAGNOSIS — S0003XA Contusion of scalp, initial encounter: Secondary | ICD-10-CM | POA: Diagnosis not present

## 2022-03-07 DIAGNOSIS — W050XXA Fall from non-moving wheelchair, initial encounter: Secondary | ICD-10-CM | POA: Diagnosis not present

## 2022-03-07 DIAGNOSIS — Y92129 Unspecified place in nursing home as the place of occurrence of the external cause: Secondary | ICD-10-CM | POA: Insufficient documentation

## 2022-03-07 DIAGNOSIS — S0990XA Unspecified injury of head, initial encounter: Secondary | ICD-10-CM | POA: Diagnosis present

## 2022-03-07 NOTE — ED Provider Notes (Signed)
[facility] Provider Note    Event Date/Time   First MD Initiated Contact with Patient 03/07/22 1044     (approximate)   History   Fall   HPI  Lori Petersen [facility] is a 67 y.o. female  here with fall. Pt reportedly was being moved with a lift when it broke, causing her to fall and hit her head. No loc. She is not on blood thinners. She has been at her metnal baseline since the episode. Denies any complaints on my assessment though history limited 2/2 dementia.       Physical Exam   Triage Vital Signs: ED Triage Vitals  Enc Vitals Group     BP 03/07/22 1030 (!) 130/107     Pulse Rate 03/07/22 1030 79     Resp 03/07/22 1030 18     Temp 03/07/22 1030 97.8 F (36.6 C)     Temp Source 03/07/22 1030 Temporal     SpO2 03/07/22 1023 100 %     Weight 03/07/22 1032 214 lb 8.1 oz (97.3 kg)     Height 03/07/22 1032 5' 8"$  (1.727 m)     Head Circumference --      [place] Flow --      Pain Score 03/07/22 1032 0     Pain Loc --      Pain Edu? --      Excl. in [place]? --     Most recent vital signs: Vitals:   03/07/22 1142 03/07/22 1232  BP: 125/73 (!) 124/106  Pulse: (!) 56 85  Resp: 18 16  Temp:  97.9 F (36.6 C)  SpO2: 98% 99%     General: Awake, no distress.  CV:  Good peripheral perfusion. RRR. No m/r/g. Resp:  Normal effort. Lungs clear. Abd:  No distention. No tenderness. Other:  Left leg slightly internally rotated though no overt pain with ROM. Head NCAT. No bruising noted. No periorbital ecchymoses.   ED Results / Procedures / Treatments   Labs (all labs ordered are listed, but only abnormal results are displayed) Labs Reviewed - No data to display   EKG    RADIOLOGY CT Head/Neck: No acute abnormality, no fx DG Hip Left: No acute findings   I also independently reviewed and agree with radiologist interpretations.   PROCEDURES:  Critical Care performed: No   MEDICATIONS ORDERED IN ED: Medications - No data to  display   IMPRESSION / MDM / [place] / ED COURSE  I reviewed the triage vital signs and the nursing notes.                              Differential diagnosis includes, but is not limited to, mechanical fall, intracranial bleed/injury, SDH  Patient's presentation is most consistent with acute presentation with potential threat to life or bodily function.  67 yo F here with pain after being dropped from lift chair. No apparent major trauma on exam. She is at her mental baseline and moving all extremities. CT Head/C Spine reviewed and are negative. DG hip negative. Discussed with pt's provider at [facility], will d/c with return precautions including any weakness, grip strength changes or signs of occult cord injury despite normal CT. No apparent pain on exam.  FINAL CLINICAL IMPRESSION(S) / ED DIAGNOSES   Final diagnoses:  Fall, initial encounter  Hematoma of scalp, initial encounter     Rx / DC Orders   ED  Discharge Orders     None        Note:  This document was prepared using Dragon voice recognition software and may include unintentional dictation errors.   Duffy Bruce, MD 03/07/22 [phone]

## 2022-03-07 NOTE — ED Notes (Signed)
[facility] representative Judeen Hammans RN called at this time for an update. She clarified that the pt fell out of the lift head first onto the ground. Dr. Ellender Hose was notified.

## 2022-03-07 NOTE — ED Triage Notes (Signed)
Pt comes from a nursing home by EMS for a fall. Pt fell out of the lift from wheelchair to bed. Pt denies any pain. Pt answers with Yes/No questions, this is baseline. Pt is not on blood thinners. 3L [place] at baseline

## 2022-03-07 NOTE — ED Notes (Signed)
Judeen Hammans, RN with [facility] given contact information [phone] if any questions or concerns regarding patient's care.

## 2022-12-01 ENCOUNTER — Emergency Department: Payer: Medicare (Managed Care)

## 2022-12-01 ENCOUNTER — Inpatient Hospital Stay
Admission: EM | Admit: 2022-12-01 | Discharge: 2022-12-05 | DRG: 100 | Disposition: A | Discharge status: Skilled Nursing Facility | Payer: Medicare (Managed Care) | Source: Skilled Nursing Facility | Attending: Internal Medicine | Admitting: Internal Medicine

## 2022-12-01 ENCOUNTER — Other Ambulatory Visit: Payer: Self-pay

## 2022-12-01 DIAGNOSIS — Z79890 Hormone replacement therapy (postmenopausal): Secondary | ICD-10-CM

## 2022-12-01 DIAGNOSIS — L89153 Pressure ulcer of sacral region, stage 3: Secondary | ICD-10-CM | POA: Diagnosis present

## 2022-12-01 DIAGNOSIS — G40909 Epilepsy, unspecified, not intractable, without status epilepticus: Principal | ICD-10-CM

## 2022-12-01 DIAGNOSIS — Z79899 Other long term (current) drug therapy: Secondary | ICD-10-CM

## 2022-12-01 DIAGNOSIS — F0150 Vascular dementia without behavioral disturbance: Secondary | ICD-10-CM | POA: Diagnosis present

## 2022-12-01 DIAGNOSIS — R569 Unspecified convulsions: Secondary | ICD-10-CM

## 2022-12-01 DIAGNOSIS — G4733 Obstructive sleep apnea (adult) (pediatric): Secondary | ICD-10-CM | POA: Diagnosis present

## 2022-12-01 DIAGNOSIS — F0280 Dementia in other diseases classified elsewhere without behavioral disturbance: Secondary | ICD-10-CM | POA: Diagnosis present

## 2022-12-01 DIAGNOSIS — Z7982 Long term (current) use of aspirin: Secondary | ICD-10-CM

## 2022-12-01 DIAGNOSIS — I5032 Chronic diastolic (congestive) heart failure: Secondary | ICD-10-CM | POA: Diagnosis present

## 2022-12-01 DIAGNOSIS — H409 Unspecified glaucoma: Secondary | ICD-10-CM | POA: Diagnosis present

## 2022-12-01 DIAGNOSIS — R339 Retention of urine, unspecified: Secondary | ICD-10-CM | POA: Diagnosis present

## 2022-12-01 DIAGNOSIS — Z7951 Long term (current) use of inhaled steroids: Secondary | ICD-10-CM

## 2022-12-01 DIAGNOSIS — G309 Alzheimer's disease, unspecified: Secondary | ICD-10-CM | POA: Diagnosis present

## 2022-12-01 DIAGNOSIS — G40919 Epilepsy, unspecified, intractable, without status epilepticus: Secondary | ICD-10-CM

## 2022-12-01 DIAGNOSIS — E785 Hyperlipidemia, unspecified: Secondary | ICD-10-CM | POA: Diagnosis present

## 2022-12-01 DIAGNOSIS — E039 Hypothyroidism, unspecified: Secondary | ICD-10-CM | POA: Diagnosis not present

## 2022-12-01 DIAGNOSIS — Z7984 Long term (current) use of oral hypoglycemic drugs: Secondary | ICD-10-CM

## 2022-12-01 DIAGNOSIS — E119 Type 2 diabetes mellitus without complications: Secondary | ICD-10-CM | POA: Diagnosis present

## 2022-12-01 DIAGNOSIS — K760 Fatty (change of) liver, not elsewhere classified: Secondary | ICD-10-CM | POA: Diagnosis present

## 2022-12-01 DIAGNOSIS — J45909 Unspecified asthma, uncomplicated: Secondary | ICD-10-CM | POA: Diagnosis present

## 2022-12-01 DIAGNOSIS — Q909 Down syndrome, unspecified: Secondary | ICD-10-CM

## 2022-12-01 DIAGNOSIS — K219 Gastro-esophageal reflux disease without esophagitis: Secondary | ICD-10-CM | POA: Diagnosis present

## 2022-12-01 LAB — COMPREHENSIVE METABOLIC PANEL
ALT: 16 U/L (ref 0–44)
AST: 18 U/L (ref 15–41)
Albumin: 3.2 g/dL — ABNORMAL LOW (ref 3.5–5.0)
Alkaline Phosphatase: 45 U/L (ref 38–126)
Anion gap: 7 (ref 5–15)
BUN: 15 mg/dL (ref 8–23)
CO2: 29 mmol/L (ref 22–32)
Calcium: 8.5 mg/dL — ABNORMAL LOW (ref 8.9–10.3)
Chloride: 106 mmol/L (ref 98–111)
Creatinine, Ser: 0.99 mg/dL (ref 0.44–1.00)
GFR, Estimated: >60 mL/min (ref >60)
Glucose, Bld: 81 mg/dL (ref 70–99)
Potassium: 3.6 mmol/L (ref 3.5–5.1)
Sodium: 142 mmol/L (ref 135–145)
Total Bilirubin: 0.6 mg/dL (ref <1.2)
Total Protein: 7.1 g/dL (ref 6.5–8.1)

## 2022-12-01 LAB — CBC WITH DIFFERENTIAL/PLATELET
Abs Immature Granulocytes: 0.04 10*3/uL (ref 0.00–0.07)
Basophils Absolute: 0.1 10*3/uL (ref 0.0–0.1)
Basophils Relative: 1 %
Eosinophils Absolute: 0 10*3/uL (ref 0.0–0.5)
Eosinophils Relative: 0 %
HCT: 42.1 % (ref 36.0–46.0)
Hemoglobin: 13.2 g/dL (ref 12.0–15.0)
Immature Granulocytes: 1 %
Lymphocytes Relative: 33 %
Lymphs Abs: 1.6 10*3/uL (ref 0.7–4.0)
MCH: 32.5 pg (ref 26.0–34.0)
MCHC: 31.4 g/dL (ref 30.0–36.0)
MCV: 103.7 fL — ABNORMAL HIGH (ref 80.0–100.0)
Monocytes Absolute: 0.5 10*3/uL (ref 0.1–1.0)
Monocytes Relative: 10 %
Neutro Abs: 2.7 10*3/uL (ref 1.7–7.7)
Neutrophils Relative %: 55 %
Platelets: 193 10*3/uL (ref 150–400)
RBC: 4.06 MIL/uL (ref 3.87–5.11)
RDW: 14.2 % (ref 11.5–15.5)
WBC: 4.9 10*3/uL (ref 4.0–10.5)
nRBC: 0 % (ref 0.0–0.2)

## 2022-12-01 LAB — BRAIN NATRIURETIC PEPTIDE: B Natriuretic Peptide: 16.1 pg/mL (ref 0.0–100.0)

## 2022-12-01 MED ORDER — ONDANSETRON HCL 4 MG/2ML IJ SOLN: 4.0000 mg | Freq: Three times a day (TID) | INTRAMUSCULAR | Status: DC | PRN

## 2022-12-01 MED ORDER — LORAZEPAM 2 MG/ML IJ SOLN
2.0000 mg | Freq: Once | INTRAMUSCULAR | Status: AC
  Administered 2022-12-02: 2 mg via INTRAVENOUS
  Filled 2022-12-01: qty 1

## 2022-12-01 MED ORDER — LEVETIRACETAM IN NACL 1500 MG/100ML IV SOLN
1500.0000 mg | Freq: Once | INTRAVENOUS | Status: AC
  Administered 2022-12-01: 1500 mg via INTRAVENOUS
  Filled 2022-12-01: qty 100

## 2022-12-01 MED ORDER — ENOXAPARIN SODIUM 40 MG/0.4ML IJ SOSY
40.0000 mg | PREFILLED_SYRINGE | INTRAMUSCULAR | Status: DC
  Administered 2022-12-02 – 2022-12-05 (×4): 40 mg via SUBCUTANEOUS
  Filled 2022-12-01 (×4): qty 0.4

## 2022-12-01 MED ORDER — ACETAMINOPHEN 650 MG RE SUPP: 650.0000 mg | Freq: Four times a day (QID) | RECTAL | Status: DC | PRN

## 2022-12-01 MED ORDER — ALBUTEROL SULFATE (2.5 MG/3ML) 0.083% IN NEBU: 2.5000 mg | INHALATION_SOLUTION | RESPIRATORY_TRACT | Status: DC | PRN

## 2022-12-01 MED ORDER — LORAZEPAM 2 MG/ML IJ SOLN: 2.0000 mg | INTRAMUSCULAR | Status: DC | PRN

## 2022-12-01 MED ORDER — SODIUM CHLORIDE 0.9 % IV SOLN: INTRAVENOUS | Status: DC

## 2022-12-01 NOTE — ED Triage Notes (Signed)
Pt to ED via [facility] from doctor's office. Pt had seizure at [facility] this morning, took to dr's office, had another seizure. Pt has not been talking since first seizure, sometimes startles and jerks per EMS. Pt has hx dementia. Unable to follow commands or answer orientation questions.  HR 68 95% Room air BP 123/69 Temp 98.1 Cbg 112

## 2022-12-01 NOTE — ED Provider Notes (Signed)
[facility] Provider Note    Event Date/Time   First MD Initiated Contact with Patient 12/01/22 1504     (approximate)   History   Seizures   HPI  Lori Petersen is a 67 y.o. female with a history of Down syndrome, cognitive impairment, dementia, and seizure disorder on Keppra who presents with seizures.  Per EMS, the [facility] this morning and then had another seizure when she was taken to her doctor's office.  The patient is unable to provide any significant history.  I reviewed the past medical records.  The patient was most recently seen by neurology on 8/12 she is on Keppra as well as midazolam as needed.  Mental status as documented in her last neurology note from August is as follows: "Alert, Oriented to, place, person.  Significant difficulty speaking. Dysarthric speech. Could not recall date. Able to identify sister by name and relationship. Could follow one step commands but not two step commands. Could not recall where she lives. Attention span and concentration seemed appropriate. Language seemed intact (naming, spontaneous speech, comprehension)"   Physical Exam   Triage Vital Signs: ED Triage Vitals [12/01/22 1505]  Encounter Vitals Group     BP (!) 116/98     Systolic BP Percentile      Diastolic BP Percentile      Pulse Rate 67     Resp      Temp      Temp src      SpO2      Weight      Height      Head Circumference      [place] Flow      Pain Score      Pain Loc      Pain Education      Exclude from Growth Chart     Most recent vital signs: Vitals:   12/01/22 2153 12/01/22 2300  BP:  123/64  Pulse:  76  Resp:  15  Temp: 98.2 F (36.8 C)   SpO2:  100%     General: Alert, no distress.  CV:  Good peripheral perfusion.  Resp:  Normal effort.  Abd:  No distention.  Other:  EOMI.  PERRLA.  No gaze deviation.  No facial droop.  Motor intact in all extremities.  Responds to questions in  monosyllabic grunts.  Follows some commands.   ED Results / Procedures / Treatments   Labs (all labs ordered are listed, but only abnormal results are displayed) Labs Reviewed  CBC WITH DIFFERENTIAL/PLATELET - Abnormal; Notable for the following components:      Result Value   MCV 103.7 (*)    All other components within normal limits  COMPREHENSIVE METABOLIC PANEL - Abnormal; Notable for the following components:   Calcium 8.5 (*)    Albumin 3.2 (*)    All other components within normal limits  BRAIN NATRIURETIC PEPTIDE  URINALYSIS, ROUTINE W REFLEX MICROSCOPIC  LEVETIRACETAM LEVEL  HIV ANTIBODY (ROUTINE TESTING W REFLEX)  BASIC METABOLIC PANEL  CBC     EKG  ED ECG REPORT I, Dionne Bucy, the attending physician, personally viewed and interpreted this ECG.  Date: 12/01/2022 EKG Time: 1703 Rate: 72 Rhythm: normal sinus rhythm QRS Axis: normal Intervals: normal ST/T Wave abnormalities: normal Narrative Interpretation: no evidence of acute ischemia    RADIOLOGY  CT head: I independently viewed and interpreted the images; there is no ICH.  Radiology report indicates  no acute abnormality.  PROCEDURES:  Critical Care performed: No  Procedures   MEDICATIONS ORDERED IN ED: Medications  LORazepam (ATIVAN) injection 2 mg (has no administration in time range)  ondansetron (ZOFRAN) injection 4 mg (has no administration in time range)  acetaminophen (TYLENOL) suppository 650 mg (has no administration in time range)  albuterol (PROVENTIL) (2.5 MG/3ML) 0.083% nebulizer solution 2.5 mg (has no administration in time range)  0.9 %  sodium chloride infusion (has no administration in time range)  enoxaparin (LOVENOX) injection 40 mg (has no administration in time range)  LORazepam (ATIVAN) injection 2 mg (has no administration in time range)  levETIRAcetam (KEPPRA) IVPB 1500 mg/ 100 mL premix (0 mg Intravenous Stopped 12/01/22 1717)     IMPRESSION / MDM /  ASSESSMENT AND PLAN / ED COURSE  I reviewed the triage vital signs and the nursing notes.  67 year old female with PMH as noted above including known seizure disorder presents with 2 seizures today.  Currently on exam the patient does not demonstrate any active seizure activity and has no focal neuro deficits.  Baseline mental status is unclear but the patient appears alert and is following some commands.  Differential diagnosis includes, but is not limited to, breakthrough seizure, medication noncompliance, electrolyte abnormality, other metabolic disturbance, less likely acute CVA or intracranial hemorrhage.  Will obtain CT head, lab workup including Keppra level, urinalysis, load with IV Keppra, and reassess.  Patient's presentation is most consistent with acute presentation with potential threat to life or bodily function.  ----------------------------------------- 9:54 PM on 12/01/2022 -----------------------------------------  CBC shows no acute findings.  CMP was delayed due to the sample hemolyzing multiple times.  It shows no significant electrolyte abnormalities.  On reassessment, the patient remains somewhat somnolent compared to the documented mental status from a few months ago.  She is arousable and continues to have no evidence of seizure activity or focal neurologic findings, but is having a somewhat prolonged postictal state.  Therefore I will admit her for observation.  I consulted Dr. Clyde Lundborg from the hospitalist service; based on our discussion he agrees to evaluate the patient for admission.   FINAL CLINICAL IMPRESSION(S) / ED DIAGNOSES   Final diagnoses:  Seizure disorder (HCC)     Rx / DC Orders   ED Discharge Orders     None        Note:  This document was prepared using Dragon voice recognition software and may include unintentional dictation errors.    Dionne Bucy, MD 12/01/22 [phone]

## 2022-12-01 NOTE — H&P (Signed)
History and Physical    Ronell Pellow [facility] FIE:[id number] DOB: 1955-02-17 DOA: 12/01/2022  Referring MD/NP/PA:   PCP: Hillery Aldo, MD   Patient coming from:  The patient is coming from home.     Chief Complaint:   HPI: Lori Petersen is a 67 y.o. female with medical history significant of      Data reviewed independently and ED Course: pt was found to have     ***       EKG: I have personally reviewed.  Not done in ED, will get one.   ***   Review of Systems:   General: no fevers, chills, no body weight gain, has poor appetite, has fatigue HEENT: no blurry vision, hearing changes or sore throat Respiratory: no dyspnea, coughing, wheezing CV: no chest pain, no palpitations GI: no nausea, vomiting, abdominal pain, diarrhea, constipation GU: no dysuria, burning on urination, increased urinary frequency, hematuria  Ext: no leg edema Neuro: no unilateral weakness, numbness, or tingling, no vision change or hearing loss Skin: no rash, no skin tear. MSK: No muscle spasm, no deformity, no limitation of range of movement in spin Heme: No easy bruising.  Travel history: No recent long distant travel.   Allergy: No Known Allergies  Past Medical History:  Diagnosis Date   AKI (acute kidney injury) (HCC)    Arthritis    lower spine   Asthma    Barrett's esophagus    Bradycardia    Constipation    DDD (degenerative disc disease), cervical    Dementia (HCC)    Diastolic congestive heart failure (HCC)    "only has problems with bad colds"   Down's syndrome    Dysrhythmia    long QT disorder,   Fatty liver    Full dentures    does not wear   GERD (gastroesophageal reflux disease)    barrett's   Glaucoma    History of kidney stones    HLD (hyperlipidemia)    Hypothyroidism    Mixed dementia (HCC)    Nausea & vomiting 03/25/2021   Onychomycosis    OSA (obstructive sleep apnea)    Pneumonia 05/17/2021   Pre-diabetes    Pressure ulcers of skin of multiple topographic  sites 2023   Respiratory failure (HCC)    Seizures (HCC)    Sepsis (HCC) 05/17/2021   Speech articulation disorder    Spinal stenosis of lumbar region    Thrombocytopenia (HCC)    UTI (urinary tract infection) 05/17/2021   Vitamin D deficiency     Past Surgical History:  Procedure Laterality Date   BREAST BIOPSY Left 04/05/2016   neg. cylinder clip   BREAST BIOPSY Left 12/06/2016   ribbon clip. path pending   COLONOSCOPY WITH PROPOFOL N/A 04/24/2016   Procedure: COLONOSCOPY WITH PROPOFOL;  Surgeon: Midge Minium, MD;  Location: [facility];  Service: Endoscopy;  Laterality: N/A;  Leave patient at 10:25 due to transportation   CYSTOSCOPY WITH RETROGRADE PYELOGRAM, URETEROSCOPY AND STENT PLACEMENT Left 06/25/2021   Procedure: CYSTOSCOPY WITH RETROGRADE PYELOGRAM, URETEROSCOPY AND STENT PLACEMENT;  Surgeon: Riki Altes, MD;  Location: [facility];  Service: Urology;  Laterality: Left;   CYSTOSCOPY WITH STENT PLACEMENT Left 03/24/2021   Procedure: CYSTOSCOPY WITH STENT PLACEMENT;  Surgeon: Sondra Come, MD;  Location: [facility];  Service: Urology;  Laterality: Left;   CYSTOSCOPY/URETEROSCOPY/HOLMIUM LASER/STENT PLACEMENT Left 06/24/2021   Procedure: CYSTOSCOPY/URETEROSCOPY/HOLMIUM LASER/STENT PLACEMENT;  Surgeon: Sondra Come, MD;  Location: [facility];  Service: Urology;  Laterality: Left;   CYSTOSCOPY/URETEROSCOPY/HOLMIUM LASER/STENT PLACEMENT Left 07/22/2021   Procedure: CYSTOSCOPY/URETEROSCOPY/HOLMIUM LASER/STENT PLACEMENT;  Surgeon: Sondra Come, MD;  Location: [facility];  Service: Urology;  Laterality: Left;   ESOPHAGOGASTRODUODENOSCOPY (EGD) WITH PROPOFOL N/A 04/24/2016   Procedure: ESOPHAGOGASTRODUODENOSCOPY (EGD) WITH PROPOFOL;  Surgeon: Midge Minium, MD;  Location: [facility];  Service: Endoscopy;  Laterality: N/A;   HERNIA REPAIR      Social History:  reports that she has never smoked. She has never used smokeless tobacco. She reports that she does not currently  use drugs. She reports that she does not drink alcohol.  Family History: No family history on file.   Prior to Admission medications   Medication Sig Start Date End Date Taking? Authorizing Provider  albuterol (PROVENTIL) (2.5 MG/3ML) 0.083% nebulizer solution Take 2.5 mg by nebulization every 4 (four) hours as needed for wheezing or shortness of breath.    [provider]  aspirin EC 81 MG tablet Take 81 mg by mouth daily.    [provider]  budesonide (PULMICORT) 0.25 MG/2ML nebulizer solution Take 0.25 mg by nebulization 2 (two) times daily.    [provider]  budesonide-formoterol (SYMBICORT) 80-4.5 MCG/ACT inhaler Inhale 2 puffs into the lungs 2 (two) times daily. With spacer device Patient not taking: Reported on 07/13/2021    [provider]  cephALEXin (KEFLEX) 500 MG capsule Take 1 capsule (500 mg total) by mouth 2 (two) times daily. 07/22/21   Sondra Come, MD  cholecalciferol (VITAMIN D3) 25 MCG (1000 UT) tablet Take 2,000 Units by mouth daily.    [provider]  collagenase (SANTYL) 250 UNIT/GM ointment Apply 1 Application topically daily. Sacral pressure wound    [provider]  DESITIN 13 % CREA Apply 1 application. topically in the morning and at bedtime. Patient not taking: Reported on 07/13/2021 03/29/21   [provider]  DIMETHICONE-ZINC OXIDE-VIT A-D EX Apply 1 Tube topically 3 (three) times daily. Apply to Gluteal areas during toileting.    [provider]  empagliflozin (JARDIANCE) 10 MG TABS tablet Take 10 mg by mouth daily. Patient not taking: Reported on 07/13/2021    [provider]  formoterol (PERFOROMIST) 20 MCG/2ML nebulizer solution Take 20 mcg by nebulization 2 (two) times daily.    [provider]  latanoprost (XALATAN) 0.005 % ophthalmic solution Place 1 drop into the left eye at bedtime.    [provider]  levETIRAcetam (KEPPRA) 250 MG tablet Take 250 mg by  mouth 2 (two) times daily. (Take with 500mg  tablet to equal 750mg  total)    [provider]  levETIRAcetam (KEPPRA) 500 MG tablet Take 500 mg by mouth 2 (two) times daily. (Take with 250mg  tablet to equal 750mg  total)    [provider]  levothyroxine (SYNTHROID) 100 MCG tablet Take 100 mcg by mouth daily before breakfast.    [provider]  midazolam (VERSED) 5 MG/5ML SOLN injection Inject 2 mg into the muscle See admin instructions. Per MAR: Inject 2ml intramuscularly single dose as needed for seizure lasting longer than 5 minutes.    [provider]  omeprazole (PRILOSEC) 20 MG capsule Take 20 mg by mouth daily.    [provider]  pravastatin (PRAVACHOL) 40 MG tablet Take 40 mg by mouth at bedtime.    [provider]  prenatal vitamin w/FE, FA (PRENATAL 1 + 1) 27-1 MG TABS tablet Take 1 tablet by mouth daily.    [provider]  senna Mancel Parsons)  8.6 MG TABS tablet Take 2 tablets by mouth in the morning and at bedtime.    [provider]  silver sulfADIAZINE (SILVADENE) 1 % cream Apply 1 Application topically daily. Apply to left dorsal wrist    [provider]  vitamin B-12 (CYANOCOBALAMIN) 1000 MCG tablet Take 1,000 mcg by mouth daily.    [provider]  Zinc Oxide 13 % CREA Apply 1 application. topically 3 (three) times daily as needed (skin protection). Apply to gluteal area 3X daily with toileting - to protect skin Patient not taking: Reported on 07/13/2021    [provider]    Physical Exam: Vitals:   12/01/22 1505 12/01/22 1717  BP: (!) 116/98 (!) 115/97  Pulse: 67 75  Resp:  15  Temp:  98.5 F (36.9 C)  TempSrc:  Axillary  SpO2:  95%   General: Not in acute distress HEENT:       Eyes: PERRL, EOMI, no jaundice       ENT: No discharge from the ears and nose, no pharynx injection, no tonsillar enlargement.        Neck: No JVD, no bruit, no mass felt. Heme: No neck lymph node  enlargement. Cardiac: S1/S2, RRR, No murmurs, No gallops or rubs. Respiratory: No rales, wheezing, rhonchi or rubs. GI: Soft, nondistended, nontender, no rebound pain, no organomegaly, BS present. GU: No hematuria Ext: No pitting leg edema bilaterally. 1+DP/PT pulse bilaterally. Musculoskeletal: No joint deformities, No joint redness or warmth, no limitation of ROM in spin. Skin: No rashes.  Neuro: Alert, oriented X3, cranial nerves II-XII grossly intact, moves all extremities normally. Muscle strength 5/5 in all extremities, sensation to light touch intact. Brachial reflex 2+ bilaterally. Knee reflex 1+ bilaterally. Negative Babinski's sign. Normal finger to nose test. Psych: Patient is not psychotic, no suicidal or hemocidal ideation.  Labs on Admission: I have personally reviewed following labs and imaging studies  CBC: Recent Labs  Lab 12/01/22 1553  WBC 4.9  NEUTROABS 2.7  HGB 13.2  HCT 42.1  MCV 103.7*  PLT 193   Basic Metabolic Panel: Recent Labs  Lab 12/01/22 2045  NA 142  K 3.6  CL 106  CO2 29  GLUCOSE 81  BUN 15  CREATININE 0.99  CALCIUM 8.5*   GFR: CrCl cannot be calculated (Unknown ideal weight.). Liver Function Tests: Recent Labs  Lab 12/01/22 2045  AST 18  ALT 16  ALKPHOS 45  BILITOT 0.6  PROT 7.1  ALBUMIN 3.2*   No results for input(s): "LIPASE", "AMYLASE" in the last 168 hours. No results for input(s): "AMMONIA" in the last 168 hours. Coagulation Profile: No results for input(s): "INR", "PROTIME" in the last 168 hours. Cardiac Enzymes: No results for input(s): "CKTOTAL", "CKMB", "CKMBINDEX", "TROPONINI" in the last 168 hours. BNP (last 3 results) No results for input(s): "PROBNP" in the last 8760 hours. HbA1C: No results for input(s): "HGBA1C" in the last 72 hours. CBG: No results for input(s): "GLUCAP" in the last 168 hours. Lipid Profile: No results for input(s): "CHOL", "HDL", "LDLCALC", "TRIG", "CHOLHDL", "LDLDIRECT" in the last 72  hours. Thyroid Function Tests: No results for input(s): "TSH", "T4TOTAL", "FREET4", "T3FREE", "THYROIDAB" in the last 72 hours. Anemia Panel: No results for input(s): "VITAMINB12", "FOLATE", "FERRITIN", "TIBC", "IRON", "RETICCTPCT" in the last 72 hours. Urine analysis:    Component Value Date/Time   COLORURINE RED (A) 06/25/2021 1335   APPEARANCEUR CLOUDY (A) 06/25/2021 1335   APPEARANCEUR CLEAR 02/22/2013 2300   LABSPEC 1.027 06/25/2021 1335  LABSPEC 1.013 02/22/2013 2300   PHURINE  06/25/2021 1335    TEST NOT REPORTED DUE TO COLOR INTERFERENCE OF URINE PIGMENT   GLUCOSEU (A) 06/25/2021 1335    TEST NOT REPORTED DUE TO COLOR INTERFERENCE OF URINE PIGMENT   GLUCOSEU NEGATIVE 02/22/2013 2300   HGBUR (A) 06/25/2021 1335    TEST NOT REPORTED DUE TO COLOR INTERFERENCE OF URINE PIGMENT   BILIRUBINUR (A) 06/25/2021 1335    TEST NOT REPORTED DUE TO COLOR INTERFERENCE OF URINE PIGMENT   BILIRUBINUR NEGATIVE 02/22/2013 2300   KETONESUR (A) 06/25/2021 1335    TEST NOT REPORTED DUE TO COLOR INTERFERENCE OF URINE PIGMENT   PROTEINUR (A) 06/25/2021 1335    TEST NOT REPORTED DUE TO COLOR INTERFERENCE OF URINE PIGMENT   NITRITE (A) 06/25/2021 1335    TEST NOT REPORTED DUE TO COLOR INTERFERENCE OF URINE PIGMENT   LEUKOCYTESUR (A) 06/25/2021 1335    TEST NOT REPORTED DUE TO COLOR INTERFERENCE OF URINE PIGMENT   LEUKOCYTESUR NEGATIVE 02/22/2013 2300   Sepsis Labs: @LABRCNTIP (procalcitonin:4,lacticidven:4) )No results found for this or any previous visit (from the past 240 hour(s)).   Radiological Exams on Admission: CT Head Wo Contrast  Result Date: 12/01/2022 CLINICAL DATA:  Seizure. Patient has been non communicative since the seizure. EXAM: CT HEAD WITHOUT CONTRAST TECHNIQUE: Contiguous axial images were obtained from the base of the skull through the vertex without intravenous contrast. RADIATION DOSE REDUCTION: This exam was performed according to the departmental dose-optimization  program which includes automated exposure control, adjustment of the mA and/or kV according to patient size and/or use of iterative reconstruction technique. COMPARISON:  CT head without contrast 03/07/2022 FINDINGS: Brain: No acute infarct, hemorrhage, or mass lesion is present. Advanced atrophy and white matter disease is present. Remote lacunar infarcts are present within the basal ganglia bilaterally. The ventricles are proportionate to the degree of atrophy. No significant extraaxial fluid collection is present. The brainstem and cerebellum are within normal limits. Enlarged empty sella is present. Midline structures are otherwise within normal limits. Vascular: Atherosclerotic calcifications are present. No hyperdense vessel is present. Skull: Calvarium is intact. No focal lytic or blastic lesions are present. No significant extracranial soft tissue lesion is present. The globes and orbits are within normal limits. The paranasal sinuses and mastoid air cells are clear. Sinuses/Orbits: The paranasal sinuses and mastoid air cells are clear. The globes and orbits are within normal limits. IMPRESSION: 1. No acute intracranial abnormality. 2. Advanced atrophy and white matter disease likely reflects the sequela of chronic microvascular ischemia. 3. Remote lacunar infarcts within the basal ganglia bilaterally. Electronically Signed   By: Marin Roberts M.D.   On: 12/01/2022 15:43      Assessment/Plan Active Problems:   * No active hospital problems. *   Assessment and Plan: No notes have been filed under this hospital service. Service: Hospitalist      Active Problems:   * No active hospital problems. *    DVT ppx: SQ Heparin         SQ Lovenox  Code Status: Full code   ***  Family Communication: not done, no family member is at bed side.              Yes, patient's    at bed side.       by phone  Disposition Plan:  Anticipate discharge back to previous environment  Consults  called:    Admission status and Level of care: :    Med-surg bed  for obs as inpt    progressive unit for obs   as inpt      SDU/inpation        {Inpatient:23812}  Dispo: The patient is from: {From:23814}              Anticipated d/c is to: {To:23815}              Anticipated d/c date is: {Days:23816}              Patient currently {Medically stable:23817}    Severity of Illness:  {Observation/Inpatient:21159}       Date of Service 12/01/2022    Lorretta Harp [facility]   If 7PM-7AM, please contact night-coverage [email or web address] 12/01/2022, 9:48 PM

## 2022-12-02 ENCOUNTER — Observation Stay: Payer: Medicare (Managed Care)

## 2022-12-02 DIAGNOSIS — F0150 Vascular dementia without behavioral disturbance: Secondary | ICD-10-CM | POA: Diagnosis present

## 2022-12-02 DIAGNOSIS — J45909 Unspecified asthma, uncomplicated: Secondary | ICD-10-CM | POA: Diagnosis present

## 2022-12-02 DIAGNOSIS — Z79899 Other long term (current) drug therapy: Secondary | ICD-10-CM | POA: Diagnosis not present

## 2022-12-02 DIAGNOSIS — Z79890 Hormone replacement therapy (postmenopausal): Secondary | ICD-10-CM | POA: Diagnosis not present

## 2022-12-02 DIAGNOSIS — E039 Hypothyroidism, unspecified: Secondary | ICD-10-CM | POA: Diagnosis present

## 2022-12-02 DIAGNOSIS — H409 Unspecified glaucoma: Secondary | ICD-10-CM | POA: Diagnosis present

## 2022-12-02 DIAGNOSIS — G40909 Epilepsy, unspecified, not intractable, without status epilepticus: Secondary | ICD-10-CM | POA: Diagnosis present

## 2022-12-02 DIAGNOSIS — R569 Unspecified convulsions: Secondary | ICD-10-CM | POA: Diagnosis present

## 2022-12-02 DIAGNOSIS — Z7951 Long term (current) use of inhaled steroids: Secondary | ICD-10-CM | POA: Diagnosis not present

## 2022-12-02 DIAGNOSIS — K760 Fatty (change of) liver, not elsewhere classified: Secondary | ICD-10-CM | POA: Diagnosis present

## 2022-12-02 DIAGNOSIS — Z7984 Long term (current) use of oral hypoglycemic drugs: Secondary | ICD-10-CM | POA: Diagnosis not present

## 2022-12-02 DIAGNOSIS — L89153 Pressure ulcer of sacral region, stage 3: Secondary | ICD-10-CM | POA: Diagnosis present

## 2022-12-02 DIAGNOSIS — F0280 Dementia in other diseases classified elsewhere without behavioral disturbance: Secondary | ICD-10-CM | POA: Diagnosis present

## 2022-12-02 DIAGNOSIS — E119 Type 2 diabetes mellitus without complications: Secondary | ICD-10-CM | POA: Diagnosis present

## 2022-12-02 DIAGNOSIS — R339 Retention of urine, unspecified: Secondary | ICD-10-CM | POA: Diagnosis present

## 2022-12-02 DIAGNOSIS — G4733 Obstructive sleep apnea (adult) (pediatric): Secondary | ICD-10-CM | POA: Diagnosis present

## 2022-12-02 DIAGNOSIS — Q909 Down syndrome, unspecified: Secondary | ICD-10-CM | POA: Diagnosis not present

## 2022-12-02 DIAGNOSIS — K219 Gastro-esophageal reflux disease without esophagitis: Secondary | ICD-10-CM | POA: Diagnosis present

## 2022-12-02 DIAGNOSIS — G309 Alzheimer's disease, unspecified: Secondary | ICD-10-CM | POA: Diagnosis present

## 2022-12-02 DIAGNOSIS — E785 Hyperlipidemia, unspecified: Secondary | ICD-10-CM | POA: Diagnosis present

## 2022-12-02 DIAGNOSIS — I5032 Chronic diastolic (congestive) heart failure: Secondary | ICD-10-CM | POA: Diagnosis present

## 2022-12-02 DIAGNOSIS — Z7982 Long term (current) use of aspirin: Secondary | ICD-10-CM | POA: Diagnosis not present

## 2022-12-02 LAB — CBC
HCT: 36.8 % (ref 36.0–46.0)
Hemoglobin: 11.5 g/dL — ABNORMAL LOW (ref 12.0–15.0)
MCH: 32.6 pg (ref 26.0–34.0)
MCHC: 31.3 g/dL (ref 30.0–36.0)
MCV: 104.2 fL — ABNORMAL HIGH (ref 80.0–100.0)
Platelets: 166 10*3/uL (ref 150–400)
RBC: 3.53 MIL/uL — ABNORMAL LOW (ref 3.87–5.11)
RDW: 14.1 % (ref 11.5–15.5)
WBC: 4.5 10*3/uL (ref 4.0–10.5)
nRBC: 0 % (ref 0.0–0.2)

## 2022-12-02 LAB — BASIC METABOLIC PANEL
Anion gap: 12 (ref 5–15)
BUN: 15 mg/dL (ref 8–23)
CO2: 24 mmol/L (ref 22–32)
Calcium: 8.4 mg/dL — ABNORMAL LOW (ref 8.9–10.3)
Chloride: 107 mmol/L (ref 98–111)
Creatinine, Ser: 0.85 mg/dL (ref 0.44–1.00)
GFR, Estimated: >60 mL/min (ref >60)
Glucose, Bld: 84 mg/dL (ref 70–99)
Potassium: 3.6 mmol/L (ref 3.5–5.1)
Sodium: 143 mmol/L (ref 135–145)

## 2022-12-02 LAB — HIV ANTIBODY (ROUTINE TESTING W REFLEX): HIV Screen 4th Generation wRfx: NONREACTIVE

## 2022-12-02 LAB — CBG MONITORING, ED: Glucose-Capillary: 80 mg/dL (ref 70–99)

## 2022-12-02 MED ORDER — SODIUM CHLORIDE 0.9 % IV SOLN: INTRAVENOUS | Status: AC

## 2022-12-02 MED ORDER — MOMETASONE FURO-FORMOTEROL FUM 100-5 MCG/ACT IN AERO: 2.0000 | INHALATION_SPRAY | Freq: Two times a day (BID) | RESPIRATORY_TRACT | Status: DC

## 2022-12-02 MED ORDER — BUDESONIDE 0.25 MG/2ML IN SUSP
0.2500 mg | Freq: Two times a day (BID) | RESPIRATORY_TRACT | Status: DC
  Administered 2022-12-02 – 2022-12-05 (×5): 0.25 mg via RESPIRATORY_TRACT
  Filled 2022-12-02 (×5): qty 2

## 2022-12-02 MED ORDER — LEVETIRACETAM 500 MG/5ML IV SOLN
750.0000 mg | Freq: Two times a day (BID) | INTRAVENOUS | Status: DC
  Administered 2022-12-02 – 2022-12-05 (×7): 750 mg via INTRAVENOUS
  Filled 2022-12-02 (×7): qty 7.5
  Filled 2022-12-02: qty 5

## 2022-12-02 MED ORDER — GADOBUTROL 1 MMOL/ML IV SOLN
7.0000 mL | Freq: Once | INTRAVENOUS | Status: AC | PRN
  Administered 2022-12-02: 7 mL via INTRAVENOUS

## 2022-12-02 MED ORDER — PRAVASTATIN SODIUM 20 MG PO TABS
40.0000 mg | ORAL_TABLET | Freq: Every day | ORAL | Status: DC
  Administered 2022-12-03 – 2022-12-04 (×2): 40 mg via ORAL
  Filled 2022-12-02 (×2): qty 2

## 2022-12-02 MED ORDER — SODIUM CHLORIDE 0.9 % IV SOLN: 750.0000 mg | Freq: Two times a day (BID) | INTRAVENOUS | Status: DC

## 2022-12-02 MED ORDER — LATANOPROST 0.005 % OP SOLN: 1.0000 [drp] | Freq: Every day | OPHTHALMIC | Status: DC

## 2022-12-02 MED ORDER — LEVOTHYROXINE SODIUM 50 MCG PO TABS
100.0000 ug | ORAL_TABLET | Freq: Every day | ORAL | Status: DC
  Administered 2022-12-03 – 2022-12-05 (×3): 100 ug via ORAL
  Filled 2022-12-02 (×3): qty 2

## 2022-12-02 MED ORDER — PANTOPRAZOLE SODIUM 40 MG PO TBEC
40.0000 mg | DELAYED_RELEASE_TABLET | Freq: Every day | ORAL | Status: DC
  Administered 2022-12-03 – 2022-12-05 (×3): 40 mg via ORAL
  Filled 2022-12-02 (×4): qty 1

## 2022-12-02 MED ORDER — ASPIRIN 81 MG PO TBEC
81.0000 mg | DELAYED_RELEASE_TABLET | Freq: Every day | ORAL | Status: DC
  Administered 2022-12-03 – 2022-12-05 (×3): 81 mg via ORAL
  Filled 2022-12-02 (×4): qty 1

## 2022-12-02 NOTE — Progress Notes (Signed)
Progress Note   Patient: Lori Petersen [facility] GMW:[id number] DOB: 07/10/1955 DOA: 12/01/2022     0 DOS: the patient was seen and examined on 12/02/2022   Brief hospital course: Lori Petersen [facility] is a 67 y.o. female with medical history significant of seizure, HLD, pre-DM, asthma, dCHF, hypothyroidism, GERD, Down syndrome, dementia, UTI, kidney stone, who presents with seizure.   Assessment and Plan: Seizure disorder No clear cause for breakthrough seizure. Mental status poor, more lethargic. Will get MRI brain rule out intracranial etiology. EEG ordered. Continue Keppra bid. Keppra level pending.  Chronic diastolic CHF (congestive heart failure) (HCC):  No exacerbation. CHF seem to be compensated. Avoid fluid overload.   HLD (hyperlipidemia) Start Pravastatin once she is more awake.   Hypothyroid Synthroid if able to take.   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  Active Pressure Injury/Wound(s)     Pressure Ulcer  Duration          Pressure Injury 03/24/21 Sacrum Medial Unstageable - Full thickness tissue loss in which the base of the injury is covered by slough (yellow, tan, gray, green or brown) and/or eschar (tan, brown or black) in the wound bed. 617 days   Pressure Injury 06/28/21 Buttocks Right;Left Deep Tissue Pressure Injury - Purple or maroon localized area of discolored intact skin or blood-filled blister due to damage of underlying soft tissue from pressure and/or shear. maroon patches 522 days   Pressure Injury 06/28/21 Hand Left Deep Tissue Pressure Injury - Purple or maroon localized area of discolored intact skin or blood-filled blister due to damage of underlying soft tissue from pressure and/or shear. 2 areas of dark purple red deep tissue  522 days             DVT prophylaxis   Code Status: Full Code  Subjective: Patient is seen and examined today morning. She is  lethargic not arousable. Per RN this morning she was arousable with sternal rubs, got ativan around midnight.   Physical Exam: Vitals:   12/02/22 0400 12/02/22 0800 12/02/22 1000 12/02/22 1052  BP: 103/61 111/62 120/71   Pulse: 67 77 71   Resp: 14  14   Temp:    98.2 F (36.8 C)  TempSrc:    Axillary  SpO2: 100% 100% 100%   Weight:      Height:        General - Elderly African American female, not arousable, lethargic HEENT - PERRLA, EOMI, atraumatic head, non tender sinuses. Lung - distant breath sounds, diffuse rhonchi, no wheezes. Heart - S1, S2 heard, no murmurs, rubs, trace pedal edema. Abdomen - Soft, non tender, bowel sounds good Neuro - sleepy, lethargic, unable to do full neuro exam. Skin - Warm and dry.  Data Reviewed:      Latest Ref Rng & Units 12/02/2022    5:15 AM 12/01/2022    3:53 PM 06/30/2021    5:16 AM  CBC  WBC 4.0 - 10.5 K/uL 4.5  4.9  8.2   Hemoglobin 12.0 - 15.0 g/dL 44.0  34.7  8.8   Hematocrit 36.0 - 46.0 % 36.8  42.1  29.4   Platelets 150 - 400 K/uL 166  193  47       Latest Ref Rng & Units 12/02/2022    5:15 AM 12/01/2022    8:45 PM 06/30/2021    5:16 AM  BMP  Glucose 70 - 99 mg/dL 84  81  425  BUN 8 - 23 mg/dL 15  15  22    Creatinine 0.44 - 1.00 mg/dL 4.01  0.27  2.53   Sodium 135 - 145 mmol/L 143  142  144   Potassium 3.5 - 5.1 mmol/L 3.6  3.6  3.8   Chloride 98 - 111 mmol/L 107  106  109   CO2 22 - 32 mmol/L 24  29  30    Calcium 8.9 - 10.3 mg/dL 8.4  8.5  8.1    CT Head Wo Contrast  Result Date: 12/01/2022 CLINICAL DATA:  Seizure. Patient has been non communicative since the seizure. EXAM: CT HEAD WITHOUT CONTRAST TECHNIQUE: Contiguous axial images were obtained from the base of the skull through the vertex without intravenous contrast. RADIATION DOSE REDUCTION: This exam was performed according to the departmental dose-optimization program which includes automated exposure control, adjustment of the mA and/or kV according to  patient size and/or use of iterative reconstruction technique. COMPARISON:  CT head without contrast 03/07/2022 FINDINGS: Brain: No acute infarct, hemorrhage, or mass lesion is present. Advanced atrophy and white matter disease is present. Remote lacunar infarcts are present within the basal ganglia bilaterally. The ventricles are proportionate to the degree of atrophy. No significant extraaxial fluid collection is present. The brainstem and cerebellum are within normal limits. Enlarged empty sella is present. Midline structures are otherwise within normal limits. Vascular: Atherosclerotic calcifications are present. No hyperdense vessel is present. Skull: Calvarium is intact. No focal lytic or blastic lesions are present. No significant extracranial soft tissue lesion is present. The globes and orbits are within normal limits. The paranasal sinuses and mastoid air cells are clear. Sinuses/Orbits: The paranasal sinuses and mastoid air cells are clear. The globes and orbits are within normal limits. IMPRESSION: 1. No acute intracranial abnormality. 2. Advanced atrophy and white matter disease likely reflects the sequela of chronic microvascular ischemia. 3. Remote lacunar infarcts within the basal ganglia bilaterally. Electronically Signed   By: Marin Roberts M.D.   On: 12/01/2022 15:43     Disposition: Status is: Observation The patient remains OBS appropriate and will d/c before 2 midnights.  Planned Discharge Destination: Skilled nursing facility     Time spent: 40 minutes  Author: Marcelino Duster, MD 12/02/2022 11:29 AM Secure chat 7am to 7pm For on call review [email or web address].

## 2022-12-02 NOTE — ED Notes (Signed)
Pt gives minor vocalization with sternal rubs and movement. Pt does not follow commands but did take a sip of water. MD notified. PO meds and food held at this time.

## 2022-12-03 DIAGNOSIS — Q909 Down syndrome, unspecified: Secondary | ICD-10-CM | POA: Diagnosis not present

## 2022-12-03 DIAGNOSIS — R569 Unspecified convulsions: Secondary | ICD-10-CM | POA: Diagnosis not present

## 2022-12-03 DIAGNOSIS — G309 Alzheimer's disease, unspecified: Secondary | ICD-10-CM | POA: Diagnosis not present

## 2022-12-03 DIAGNOSIS — I5032 Chronic diastolic (congestive) heart failure: Secondary | ICD-10-CM | POA: Diagnosis not present

## 2022-12-03 LAB — GLUCOSE, CAPILLARY
Glucose-Capillary: 53 mg/dL — ABNORMAL LOW (ref 70–99)
Glucose-Capillary: 65 mg/dL — ABNORMAL LOW (ref 70–99)
Glucose-Capillary: 68 mg/dL — ABNORMAL LOW (ref 70–99)
Glucose-Capillary: 86 mg/dL (ref 70–99)

## 2022-12-03 MED ORDER — PRENATAL PLUS 27-1 MG PO TABS
1.0000 | ORAL_TABLET | Freq: Every day | ORAL | Status: DC
  Administered 2022-12-03 – 2022-12-05 (×3): 1 via ORAL
  Filled 2022-12-03 (×3): qty 1

## 2022-12-03 MED ORDER — VITAMIN D 25 MCG (1000 UNIT) PO TABS
2000.0000 [IU] | ORAL_TABLET | Freq: Every day | ORAL | Status: DC
  Administered 2022-12-03 – 2022-12-05 (×3): 2000 [IU] via ORAL
  Filled 2022-12-03 (×3): qty 2

## 2022-12-03 MED ORDER — GLUCOSE 40 % PO GEL: 1.0000 | ORAL | Status: AC

## 2022-12-03 MED ORDER — VITAMIN B-12 1000 MCG PO TABS
1000.0000 ug | ORAL_TABLET | Freq: Every day | ORAL | Status: DC
  Administered 2022-12-03 – 2022-12-05 (×3): 1000 ug via ORAL
  Filled 2022-12-03 (×3): qty 1

## 2022-12-03 NOTE — Plan of Care (Signed)
  Problem: Elimination: Goal: Will not experience complications related to urinary retention Outcome: Progressing   Problem: Safety: Goal: Ability to remain free from injury will improve Outcome: Progressing   

## 2022-12-03 NOTE — Progress Notes (Signed)
Progress Note   Patient: Lori Petersen [facility] ZOX:[id number] DOB: 1956/01/07 DOA: 12/01/2022     1 DOS: the patient was seen and examined on 12/03/2022   Brief hospital course: [facility] is a 67 y.o. female with medical history significant of seizure, HLD, pre-DM, asthma, dCHF, hypothyroidism, GERD, Down syndrome, dementia, UTI, kidney stone, who presents with seizure.  Has baseline poor mental status.  Patient was loaded with IV Keppra, started on Keppra 750 twice daily.   12/02/2022 she is more sleepy and lethargic. 12/03/2022-she is more alert, able to eat with assistance, answers yes or no.  Wound care consulted.  Assessment and Plan: Seizure disorder No clear cause for breakthrough seizure. Mental status improved today, much more alert though unable to provide history due to baseline poor mental status. MRI brain rule unremarkable. EEG pending Continue Keppra 750 bid. Keppra level pending.  Chronic diastolic CHF (congestive heart failure) (HCC):  No exacerbation. CHF seem to be compensated. Avoid fluid overload.   HLD (hyperlipidemia) Pravastatin restarted.   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  Active Pressure Injury/Wound(s)     Pressure Ulcer  Duration          Pressure Injury 03/24/21 Sacrum Medial Unstageable - Full thickness tissue loss in which the base of the injury is covered by slough (yellow, tan, gray, green or brown) and/or eschar (tan, brown or black) in the wound bed. 617 days   Pressure Injury 06/28/21 Buttocks Right;Left Deep Tissue Pressure Injury - Purple or maroon localized area of discolored intact skin or blood-filled blister due to damage of underlying soft tissue from pressure and/or shear. maroon patches 522 days   Pressure Injury 06/28/21 Hand Left Deep Tissue Pressure Injury - Purple or maroon localized area of  discolored intact skin or blood-filled blister due to damage of underlying soft tissue from pressure and/or shear. 2 areas of dark purple red deep tissue  522 days          Decubitus ulcers, wound care consulted.  DVT prophylaxis   Code Status: Full Code  Subjective: Patient is seen and examined today morning. She is more alert, awake, eating breakfast with assistance.  No seizures noted.  Physical Exam: Vitals:   12/03/22 0007 12/03/22 0500 12/03/22 0742 12/03/22 0907  BP: 101/67   (!) 83/66  Pulse: 80   75  Resp: 18   16  Temp: 97.6 F (36.4 C)   98.2 F (36.8 C)  TempSrc: Axillary     SpO2: 99%  100% 90%  Weight:  80 kg    Height:        General - Elderly African American female, able to answer yes or no. HEENT - PERRLA, EOMI, atraumatic head, non tender sinuses. Lung - distant breath sounds, diffuse rhonchi, no wheezes. Heart - S1, S2 heard, no murmurs, rubs, trace pedal edema. Abdomen - Soft, non tender, bowel sounds good Neuro - alert, awake, follows simple commands, able to move extremities Skin - Warm and dry.  Data Reviewed:      Latest Ref Rng & Units 12/02/2022    5:15 AM 12/01/2022    3:53 PM 06/30/2021    5:16 AM  CBC  WBC 4.0 - 10.5 K/uL 4.5  4.9  8.2   Hemoglobin 12.0 - 15.0 g/dL 81.1  91.4  8.8   Hematocrit 36.0 - 46.0 % 36.8  42.1  29.4   Platelets  150 - 400 K/uL 166  193  47       Latest Ref Rng & Units 12/02/2022    5:15 AM 12/01/2022    8:45 PM 06/30/2021    5:16 AM  BMP  Glucose 70 - 99 mg/dL 84  81  409   BUN 8 - 23 mg/dL 15  15  22    Creatinine 0.44 - 1.00 mg/dL 8.11  9.14  7.82   Sodium 135 - 145 mmol/L 143  142  144   Potassium 3.5 - 5.1 mmol/L 3.6  3.6  3.8   Chloride 98 - 111 mmol/L 107  106  109   CO2 22 - 32 mmol/L 24  29  30    Calcium 8.9 - 10.3 mg/dL 8.4  8.5  8.1    MR BRAIN W WO CONTRAST  Result Date: 12/02/2022 CLINICAL DATA:  Mental status change, unknown cause. Seizures today. History of seizures, down syndrome, and  dementia. EXAM: MRI HEAD WITHOUT AND WITH CONTRAST TECHNIQUE: Multiplanar, multiecho pulse sequences of the brain and surrounding structures were obtained without and with intravenous contrast. CONTRAST:  7mL GADAVIST GADOBUTROL 1 MMOL/ML IV SOLN COMPARISON:  Head CT 12/01/2022 and MRI 12/03/2018 FINDINGS: Multiple sequences are moderately to severely motion degraded. Brain: There is no evidence of an acute infarct, intracranial hemorrhage, mass, midline shift, or extra-axial fluid collection. Patchy T2 hyperintensities in the cerebral white matter and pons are grossly similar to the prior MRI and are nonspecific but compatible with moderate chronic small vessel ischemic disease. No abnormal intracranial enhancement is identified, however assessment is limited by motion. There is advanced cerebral atrophy, including severe bilateral mesial temporal lobe volume loss. An enlarged, empty sella is again noted. Vascular: Poorly assessed intracranial flow voids due to motion. Skull and upper cervical spine: No destructive skull lesion. Sinuses/Orbits: Grossly unremarkable. Other: None. IMPRESSION: 1. No acute abnormality identified within limitations of motion artifact. 2. Moderate chronic small vessel ischemic disease. 3. Advanced cerebral atrophy. Electronically Signed   By: Sebastian Ache M.D.   On: 12/02/2022 14:52   DG Abd 1 View  Result Date: 12/02/2022 CLINICAL DATA:  MRI clearance EXAM: ABDOMEN - 1 VIEW COMPARISON:  CT abdomen and pelvis dated 06/25/2021 FINDINGS: Postsurgical changes from mesh hernia repair. Nonobstructive bowel gas pattern. Large volume stool in the rectum and ascending colon. No free air or pneumatosis. No abnormal radio-opaque calculi or mass effect. No acute or substantial osseous abnormality. The sacrum and coccyx are partially obscured by overlying bowel contents. IMPRESSION: 1. Postsurgical changes from mesh hernia repair. No other metallic foreign body identified. 2. Large volume stool  in the rectum and ascending colon. Recommend correlation with fecal impaction. Electronically Signed   By: Agustin Cree M.D.   On: 12/02/2022 13:17   DG Chest 1 View  Result Date: 12/02/2022 CLINICAL DATA:  MRI clearance EXAM: CHEST  1 VIEW COMPARISON:  Chest radiograph dated 06/25/2021 FINDINGS: Patient is rotated slightly to the right. Low lung volumes with bronchovascular crowding. Patchy bibasilar opacities. Trace blunting of the bilateral costophrenic angles. No pneumothorax. Similar cardiomediastinal silhouette. Degenerative changes of the bilateral shoulders. IMPRESSION: 1. Low lung volumes with bronchovascular crowding. Patchy bibasilar opacities, likely atelectasis. 2. Trace blunting of the bilateral costophrenic angles, which may represent trace pleural effusions. 3. No unexpected metallic foreign body. Electronically Signed   By: Agustin Cree M.D.   On: 12/02/2022 13:14   CT Head Wo Contrast  Result Date: 12/01/2022 CLINICAL DATA:  Seizure. Patient has been  non communicative since the seizure. EXAM: CT HEAD WITHOUT CONTRAST TECHNIQUE: Contiguous axial images were obtained from the base of the skull through the vertex without intravenous contrast. RADIATION DOSE REDUCTION: This exam was performed according to the departmental dose-optimization program which includes automated exposure control, adjustment of the mA and/or kV according to patient size and/or use of iterative reconstruction technique. COMPARISON:  CT head without contrast 03/07/2022 FINDINGS: Brain: No acute infarct, hemorrhage, or mass lesion is present. Advanced atrophy and white matter disease is present. Remote lacunar infarcts are present within the basal ganglia bilaterally. The ventricles are proportionate to the degree of atrophy. No significant extraaxial fluid collection is present. The brainstem and cerebellum are within normal limits. Enlarged empty sella is present. Midline structures are otherwise within normal limits.  Vascular: Atherosclerotic calcifications are present. No hyperdense vessel is present. Skull: Calvarium is intact. No focal lytic or blastic lesions are present. No significant extracranial soft tissue lesion is present. The globes and orbits are within normal limits. The paranasal sinuses and mastoid air cells are clear. Sinuses/Orbits: The paranasal sinuses and mastoid air cells are clear. The globes and orbits are within normal limits. IMPRESSION: 1. No acute intracranial abnormality. 2. Advanced atrophy and white matter disease likely reflects the sequela of chronic microvascular ischemia. 3. Remote lacunar infarcts within the basal ganglia bilaterally. Electronically Signed   By: Marin Roberts M.D.   On: 12/01/2022 15:43     Disposition: Status is: Observation The patient remains OBS appropriate and will d/c before 2 midnights.  Planned Discharge Destination: Skilled nursing facility     Time spent: 40 minutes  Author: Marcelino Duster, MD 12/03/2022 1:32 PM Secure chat 7am to 7pm For on call review [email or web address].

## 2022-12-03 NOTE — Plan of Care (Signed)
  Problem: Education: Goal: Knowledge of General Education information will improve Description: Including pain rating scale, medication(s)/side effects and non-pharmacologic comfort measures Outcome: Progressing   Problem: Health Behavior/Discharge Planning: Goal: Ability to manage health-related needs will improve Outcome: Progressing   Problem: Clinical Measurements: Goal: Ability to maintain clinical measurements within normal limits will improve Outcome: Progressing Goal: Will remain free from infection Outcome: Progressing Goal: Diagnostic test results will improve Outcome: Progressing Goal: Respiratory complications will improve Outcome: Progressing Goal: Cardiovascular complication will be avoided Outcome: Progressing   Problem: Activity: Goal: Risk for activity intolerance will decrease Outcome: Progressing   Problem: Nutrition: Goal: Adequate nutrition will be maintained Outcome: Progressing   Problem: Coping: Goal: Level of anxiety will decrease Outcome: Progressing   Problem: Elimination: Goal: Will not experience complications related to bowel motility Outcome: Progressing Goal: Will not experience complications related to urinary retention Outcome: Progressing   Problem: Pain Management: Goal: General experience of comfort will improve Outcome: Progressing   Problem: Safety: Goal: Ability to remain free from injury will improve Outcome: Progressing   Problem: Skin Integrity: Goal: Risk for impaired skin integrity will decrease Outcome: Progressing   Problem: Education: Goal: Expressions of having a comfortable level of knowledge regarding the disease process will increase Outcome: Progressing   Problem: Coping: Goal: Ability to adjust to condition or change in health will improve Outcome: Progressing Goal: Ability to identify appropriate support needs will improve Outcome: Progressing   Problem: Health Behavior/Discharge Planning: Goal:  Compliance with prescribed medication regimen will improve Outcome: Progressing   Problem: Medication: Goal: Risk for medication side effects will decrease Outcome: Progressing   Problem: Clinical Measurements: Goal: Complications related to the disease process, condition or treatment will be avoided or minimized Outcome: Progressing Goal: Diagnostic test results will improve Outcome: Progressing   Problem: Safety: Goal: Verbalization of understanding the information provided will improve Outcome: Progressing   Problem: Self-Concept: Goal: Level of anxiety will decrease Outcome: Progressing Goal: Ability to verbalize feelings about condition will improve Outcome: Progressing

## 2022-12-04 ENCOUNTER — Ambulatory Visit: Payer: Medicare (Managed Care)

## 2022-12-04 DIAGNOSIS — I5032 Chronic diastolic (congestive) heart failure: Secondary | ICD-10-CM | POA: Diagnosis not present

## 2022-12-04 DIAGNOSIS — R569 Unspecified convulsions: Secondary | ICD-10-CM | POA: Diagnosis not present

## 2022-12-04 DIAGNOSIS — G309 Alzheimer's disease, unspecified: Secondary | ICD-10-CM | POA: Diagnosis not present

## 2022-12-04 DIAGNOSIS — Q909 Down syndrome, unspecified: Secondary | ICD-10-CM | POA: Diagnosis not present

## 2022-12-04 LAB — GLUCOSE, CAPILLARY
Glucose-Capillary: 116 mg/dL — ABNORMAL HIGH (ref 70–99)
Glucose-Capillary: 100 mg/dL — ABNORMAL HIGH (ref 70–99)

## 2022-12-04 LAB — LEVETIRACETAM LEVEL: Levetiracetam Lvl: 37.4 ug/mL (ref 10.0–40.0)

## 2022-12-04 NOTE — NC FL2 (Signed)
[place] MEDICAID FL2 LEVEL OF CARE FORM     IDENTIFICATION  Patient Name: Lori Petersen [facility] Birthdate: 1955/02/12 Sex: female Admission Date (Current Location): 12/01/2022  [place] Number:  Chiropodist and Address:  [facility], [address] [place], [place] [address]      Provider Number: [id number]  Attending Physician Name and Address:  Marcelino Duster, MD  Relative Name and Phone Number:  Nicole Cella COusin [phone]        Dr. Arta Silence with [facility]- [phone].   [facility], Darel Hong [phone]    Current Level of Care: Hospital Recommended Level of Care: Nursing Facility Prior Approval Number:    Date Approved/Denied:   PASRR Number:    Discharge Plan: SNF    Current Diagnoses: Patient Active Problem List   Diagnosis Date Noted   Seizure (HCC) 12/01/2022   Hydronephrosis with urinary obstruction due to ureteral calculus    Respiratory failure (HCC) 05/18/2021   Pneumonia 05/17/2021   Dementia (HCC)    HLD (hyperlipidemia)    Sepsis (HCC)    Hypernatremia 03/26/2021   Thrombocytopenia (HCC) 03/26/2021   Asthma, mild intermittent 03/25/2021   Nausea & vomiting 03/25/2021   Bacteremia due to Escherichia coli 03/25/2021   Complicated UTI (urinary tract infection) 03/25/2021   Down syndrome 03/25/2021   AKI (acute kidney injury) (HCC) 03/24/2021   Septic shock (HCC)    Ureteral calculus    Seizures (HCC) 12/03/2018   Obesity, Class III, 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   Chronic diastolic CHF (congestive heart failure) (HCC) 11/06/2018   Acute metabolic encephalopathy 11/06/2018   Hypothyroid 11/06/2018   COVID-19 virus infection 11/03/2018   COVID-19    Acute respiratory failure with hypoxia (HCC) 11/02/2018   Left tibial fracture 04/27/2017   Right tibial fracture 04/27/2017   Problems with swallowing and mastication    Blood in stool    Bradycardia 05/14/2015   Mixed Alzheimer's and vascular  dementia (HCC) 09/14/2014   Onychomycosis     Orientation RESPIRATION BLADDER Height & Weight     Self  Normal, O2 (2 liters) Incontinent Weight: 83 kg Height:  5\' 8"  (172.7 cm)  BEHAVIORAL SYMPTOMS/MOOD NEUROLOGICAL BOWEL NUTRITION STATUS      Incontinent Diet  AMBULATORY STATUS COMMUNICATION OF NEEDS Skin   Extensive Assist Verbally Normal                       Personal Care Assistance Level of Assistance  Bathing, Feeding, Dressing Bathing Assistance: Maximum assistance Feeding assistance: Maximum assistance Dressing Assistance: Maximum assistance     Functional Limitations Info  Sight, Hearing, Speech Sight Info: Impaired Hearing Info: Impaired Speech Info: Impaired    SPECIAL CARE FACTORS FREQUENCY                       Contractures Contractures Info: Present (left and right ankle)    Additional Factors Info  Code Status Code Status Info: full code Allergies Info: nkda           Current Medications (12/04/2022):  This is the current hospital active medication list Current Facility-Administered Medications  Medication Dose Route Frequency Provider Last Rate Last Admin   acetaminophen (TYLENOL) suppository 650 mg  650 mg Rectal Q6H PRN Lorretta Harp, MD       albuterol (PROVENTIL) (2.5 MG/3ML) 0.083% nebulizer solution 2.5 mg  2.5 mg Nebulization Q4H PRN Lorretta Harp, MD       aspirin  EC tablet 81 mg  81 mg Oral Daily Lorretta Harp, MD   81 mg at 12/04/22 0946   budesonide (PULMICORT) nebulizer solution 0.25 mg  0.25 mg Nebulization BID Lorretta Harp, MD   0.25 mg at 12/04/22 1308   cholecalciferol (VITAMIN D3) 25 MCG (1000 UNIT) tablet 2,000 Units  2,000 Units Oral Daily Marcelino Duster, MD   2,000 Units at 12/04/22 0947   cyanocobalamin (VITAMIN B12) tablet 1,000 mcg  1,000 mcg Oral Daily Marcelino Duster, MD   1,000 mcg at 12/04/22 0946   enoxaparin (LOVENOX) injection 40 mg  40 mg Subcutaneous Q24H Lorretta Harp, MD   40 mg at 12/04/22 0947    levETIRAcetam (KEPPRA) 750 mg in sodium chloride 0.9 % 100 mL IVPB  750 mg Intravenous Willette Pa, MD 430 mL/hr at 12/04/22 0631 750 mg at 12/04/22 0631   levothyroxine (SYNTHROID) tablet 100 mcg  100 mcg Oral Q0600 Lorretta Harp, MD   100 mcg at 12/04/22 0630   LORazepam (ATIVAN) injection 2 mg  2 mg Intravenous Q2H PRN Lorretta Harp, MD       ondansetron [facility]) injection 4 mg  4 mg Intravenous Q8H PRN Lorretta Harp, MD       pantoprazole (PROTONIX) EC tablet 40 mg  40 mg Oral Daily Lorretta Harp, MD   40 mg at 12/04/22 0947   pravastatin (PRAVACHOL) tablet 40 mg  40 mg Oral QHS Lorretta Harp, MD   40 mg at 12/03/22 2149   prenatal vitamin w/FE, FA (PRENATAL 1 + 1) 27-1 MG tablet 1 tablet  1 tablet Oral Daily Marcelino Duster, MD   1 tablet at 12/04/22 6578     Discharge Medications: Please see discharge summary for a list of discharge medications.  Relevant Imaging Results:  Relevant Lab Results:   Additional Information SSN:[id number]  Marlowe Sax, RN

## 2022-12-04 NOTE — Progress Notes (Signed)
1105 Sacral wound cleaned. Wet to dry dressing applied and covered with Mepilex.

## 2022-12-04 NOTE — Progress Notes (Signed)
Eeg done 

## 2022-12-04 NOTE — Progress Notes (Signed)
Progress Note   Patient: Lori Petersen [facility] ZOX:[id number] DOB: 11/19/1955 DOA: 12/01/2022     2 DOS: the patient was seen and examined on 12/04/2022   Brief hospital course: Maryan Feehan [facility] is a 67 y.o. female with medical history significant of seizure, HLD, pre-DM, asthma, dCHF, hypothyroidism, GERD, Down syndrome, dementia, UTI, kidney stone, who presents with seizure.  Has baseline poor mental status.  Patient was loaded with IV Keppra, started on Keppra 750 twice daily.   12/02/2022 she is more sleepy and lethargic. 12/03/2022-she is more alert, able to eat with assistance, answers yes or no.   12/04/22 - sleeping, mental status at baseline. No other issues. Wound care team on board for dressing changes.  Assessment and Plan: Seizure disorder No clear cause for breakthrough seizure. Mental status improved today, much more alert though unable to provide history due to baseline poor mental status. MRI brain rule unremarkable. EEG pending Continue Keppra 750 bid. Keppra level pending.  Chronic diastolic CHF (congestive heart failure) (HCC):  No exacerbation. CHF seem to be compensated. Avoid fluid overload.   HLD (hyperlipidemia) Pravastatin restarted.   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  Active Pressure Injury/Wound(s)     Pressure Ulcer  Duration          Pressure Injury 03/24/21 Sacrum Medial Unstageable - Full thickness tissue loss in which the base of the injury is covered by slough (yellow, tan, gray, green or brown) and/or eschar (tan, brown or black) in the wound bed. 617 days   Pressure Injury 06/28/21 Buttocks Right;Left Deep Tissue Pressure Injury - Purple or maroon localized area of discolored intact skin or blood-filled blister due to damage of underlying soft tissue from pressure and/or shear. maroon patches 522 days   Pressure Injury  06/28/21 Hand Left Deep Tissue Pressure Injury - Purple or maroon localized area of discolored intact skin or blood-filled blister due to damage of underlying soft tissue from pressure and/or shear. 2 areas of dark purple red deep tissue  522 days          Decubitus ulcers, wound care on board.  DVT prophylaxis   Code Status: Full Code  Subjective: Patient is seen and examined today morning. She is sleeping comfortably, arousable.    Physical Exam: Vitals:   12/04/22 0001 12/04/22 0500 12/04/22 0725 12/04/22 0830  BP: 90/60   107/69  Pulse: 73   63  Resp: 18   16  Temp:    98.6 F (37 C)  TempSrc:      SpO2: 100%  98% 100%  Weight:  83 kg    Height:        General - Elderly African American female, able to answer yes or no. HEENT - PERRLA, EOMI, atraumatic head, non tender sinuses. Lung - distant breath sounds, diffuse rhonchi, no wheezes. Heart - S1, S2 heard, no murmurs, rubs, trace pedal edema. Abdomen - Soft, non tender, bowel sounds good Neuro - alert, awake, follows simple commands, able to move extremities Skin - Warm and dry.  Data Reviewed:      Latest Ref Rng & Units 12/02/2022    5:15 AM 12/01/2022    3:53 PM 06/30/2021    5:16 AM  CBC  WBC 4.0 - 10.5 K/uL 4.5  4.9  8.2   Hemoglobin 12.0 - 15.0 g/dL 81.1  91.4  8.8   Hematocrit 36.0 - 46.0 % 36.8  42.1  29.4   Platelets 150 - 400 K/uL 166  193  47       Latest Ref Rng & Units 12/02/2022    5:15 AM 12/01/2022    8:45 PM 06/30/2021    5:16 AM  BMP  Glucose 70 - 99 mg/dL 84  81  130   BUN 8 - 23 mg/dL 15  15  22    Creatinine 0.44 - 1.00 mg/dL 8.65  7.84  6.96   Sodium 135 - 145 mmol/L 143  142  144   Potassium 3.5 - 5.1 mmol/L 3.6  3.6  3.8   Chloride 98 - 111 mmol/L 107  106  109   CO2 22 - 32 mmol/L 24  29  30    Calcium 8.9 - 10.3 mg/dL 8.4  8.5  8.1    MR BRAIN W WO CONTRAST  Result Date: 12/02/2022 CLINICAL DATA:  Mental status change, unknown cause. Seizures today. History of seizures,  down syndrome, and dementia. EXAM: MRI HEAD WITHOUT AND WITH CONTRAST TECHNIQUE: Multiplanar, multiecho pulse sequences of the brain and surrounding structures were obtained without and with intravenous contrast. CONTRAST:  7mL GADAVIST GADOBUTROL 1 MMOL/ML IV SOLN COMPARISON:  Head CT 12/01/2022 and MRI 12/03/2018 FINDINGS: Multiple sequences are moderately to severely motion degraded. Brain: There is no evidence of an acute infarct, intracranial hemorrhage, mass, midline shift, or extra-axial fluid collection. Patchy T2 hyperintensities in the cerebral white matter and pons are grossly similar to the prior MRI and are nonspecific but compatible with moderate chronic small vessel ischemic disease. No abnormal intracranial enhancement is identified, however assessment is limited by motion. There is advanced cerebral atrophy, including severe bilateral mesial temporal lobe volume loss. An enlarged, empty sella is again noted. Vascular: Poorly assessed intracranial flow voids due to motion. Skull and upper cervical spine: No destructive skull lesion. Sinuses/Orbits: Grossly unremarkable. Other: None. IMPRESSION: 1. No acute abnormality identified within limitations of motion artifact. 2. Moderate chronic small vessel ischemic disease. 3. Advanced cerebral atrophy. Electronically Signed   By: Sebastian Ache M.D.   On: 12/02/2022 14:52     Disposition: Status is: Observation The patient remains OBS appropriate and will d/c before 2 midnights.  Planned Discharge Destination: Skilled nursing facility     Time spent: 40 minutes  Author: Marcelino Duster, MD 12/04/2022 2:14 PM Secure chat 7am to 7pm For on call review [email or web address].

## 2022-12-04 NOTE — Consult Note (Signed)
WOC Nurse Consult Note: Reason for Consult: pressure injury; sacrum  Wound type:  Stage 3 pressure injury; sacrum  Pressure Injury POA: Yes Measurement: see nursing flow sheets Wound UVO:ZDGU, slick, non granular Drainage (amount, consistency, odor) none documented  Periwound: epibole and maceration  Dressing procedure/placement/frequency: Low air loss mattress for moisture management and pressure redistribution Cleanse sacral wound with saline, pat dry  Fill wound bed with silver hydrofiber (Aquacel Ag+) Lawson # [id number]  Top with dry dressing, ABD pad, secure with tape.  Change daily   Re consult if needed, will not follow at this time. Thanks  Fantasia Jinkins [employer], RN,CWOCN, CNS, CWON-AP [phone])

## 2022-12-04 NOTE — TOC Initial Note (Signed)
Transition of Care [facility]) - Initial/Assessment Note    Patient Details  Name: Lori Petersen MRN: [id number] Date of Birth: [birth year]  Transition of Care [facility]) CM/SW Contact:    Marlowe Sax, RN Phone Number: 12/04/2022, 11:21 AM  Clinical Narrative:          Confirmed with Tammy the the patient is  long term resident at [facility], She is a [facility], called [facility] and asked to speak with Darel Hong the SW, left a Secure VM requesting a call back to discuss DC plan, Tammy at [facility] confirms that the patient may return          Expected Discharge Plan: Long Term Nursing Home Barriers to Discharge: Continued Medical Work up   Patient Goals and CMS Choice            Expected Discharge Plan and Services   Discharge Planning Services: CM Consult   Living arrangements for the past 2 months: Skilled Nursing Facility                   DME Agency: NA       HH Arranged: NA          Prior Living Arrangements/Services Living arrangements for the past 2 months: Skilled Nursing Facility Lives with:: Facility Resident Patient language and need for interpreter reviewed:: Yes Do you feel safe going back to the place where you live?: Yes      Need for Family Participation in Patient Care: Yes (Comment) Care giver support system in place?: No (comment)   Criminal Activity/Legal Involvement Pertinent to Current Situation/Hospitalization: No - Comment as needed  Activities of Daily Living      Permission Sought/Granted                  Emotional Assessment Appearance:: Appears older than stated age   Affect (typically observed): Unable to Assess   Alcohol / Substance Use: Not Applicable Psych Involvement: No (comment)  Admission diagnosis:  Seizure (HCC) [R56.9] Seizure disorder (HCC) [G40.909] Patient Active Problem List   Diagnosis Date Noted   Seizure (HCC) 12/01/2022   Hydronephrosis with urinary obstruction due to ureteral calculus    Respiratory failure (HCC)  05/18/2021   Pneumonia 05/17/2021   Dementia (HCC)    HLD (hyperlipidemia)    Sepsis (HCC)    Hypernatremia 03/26/2021   Thrombocytopenia (HCC) 03/26/2021   Asthma, mild intermittent 03/25/2021   Nausea & vomiting 03/25/2021   Bacteremia due to Escherichia coli 03/25/2021   Complicated UTI (urinary tract infection) 03/25/2021   Down syndrome 03/25/2021   AKI (acute kidney injury) (HCC) 03/24/2021   Septic shock (HCC)    Ureteral calculus    Seizures (HCC) 12/03/2018   Obesity, Class III, 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   Chronic diastolic CHF (congestive heart failure) (HCC) 11/06/2018   Acute metabolic encephalopathy 11/06/2018   Hypothyroid 11/06/2018   COVID-19 virus infection 11/03/2018   COVID-19    Acute respiratory failure with hypoxia (HCC) 11/02/2018   Left tibial fracture 04/27/2017   Right tibial fracture 04/27/2017   Problems with swallowing and mastication    Blood in stool    Bradycardia 05/14/2015   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 [place] [address] Phone: [phone] Fax: [phone]  [id number] [place], [place] - [place]  [address] [place] [address] Phone: [phone] Fax: [phone]  [facility] [place], [place] - [address] [place] [address] Phone: [phone] Fax: [phone]     Social Determinants of Health (SDOH) Social History: SDOH Screenings   Tobacco Use: Low Risk  (08/28/2022)   Received from [facility]   SDOH Interventions:     Readmission Risk Interventions    06/29/2021   11:13 AM 06/27/2021   11:00 AM  Readmission Risk Prevention Plan  Transportation Screening Complete Complete  PCP or Specialist Appt within 3-5 Days Complete   HRI or Home Care Consult  Complete   Social Work Consult for Recovery Care Planning/Counseling Complete   Palliative Care Screening Complete   Medication Review Oceanographer) Complete Complete  PCP or Specialist appointment within 3-5 days of discharge  Complete  HRI or Home Care Consult  Complete  SW Recovery Care/Counseling Consult  Not Complete  SW Consult Not Complete Comments  NA  Palliative Care Screening  Complete  Skilled Nursing Facility  Complete

## 2022-12-04 NOTE — Progress Notes (Addendum)
1600 MD made aware pt has not seemed to have voided. Last bladder scan around 11am was . Verbal order with readback for in and out cath one time and continue bladder scans  1805 Bladder scan  1830 Pt voided and had bowel movement prior to straight cath. Bladder scan after voiding shows 18ml. Will hold off on In and out cath for now. New purewick placed.

## 2022-12-04 NOTE — TOC Progression Note (Signed)
Transition of Care [facility]) - Progression Note    Patient Details  Name: Lori Petersen [facility] MRN: [id number] Date of Birth: 1955-03-25  Transition of Care [facility]) CM/SW Contact  Marlowe Sax, RN Phone Number: 12/04/2022, 11:51 AM  Clinical Narrative:     Received a message to call Trdy at [facility] at [phone], I called and left a VM asking for a return call  Expected Discharge Plan: Long Term Nursing Home Barriers to Discharge: Continued Medical Work up  Expected Discharge Plan and Services   Discharge Planning Services: CM Consult   Living arrangements for the past 2 months: Skilled Nursing Facility                   DME Agency: NA       HH Arranged: NA           Social Determinants of Health (SDOH) Interventions SDOH Screenings   Tobacco Use: Low Risk  (08/28/2022)   Received from [facility]    Readmission Risk Interventions    06/29/2021   11:13 AM 06/27/2021   11:00 AM  Readmission Risk Prevention Plan  Transportation Screening Complete Complete  PCP or Specialist Appt within 3-5 Days Complete   HRI or Home Care Consult Complete   Social Work Consult for Recovery Care Planning/Counseling Complete   Palliative Care Screening Complete   Medication Review Oceanographer) Complete Complete  PCP or Specialist appointment within 3-5 days of discharge  Complete  HRI or Home Care Consult  Complete  SW Recovery Care/Counseling Consult  Not Complete  SW Consult Not Complete Comments  NA  Palliative Care Screening  Complete  Skilled Nursing Facility  Complete

## 2022-12-05 DIAGNOSIS — R569 Unspecified convulsions: Secondary | ICD-10-CM | POA: Diagnosis not present

## 2022-12-05 DIAGNOSIS — Q909 Down syndrome, unspecified: Secondary | ICD-10-CM | POA: Diagnosis not present

## 2022-12-05 DIAGNOSIS — E785 Hyperlipidemia, unspecified: Secondary | ICD-10-CM | POA: Diagnosis not present

## 2022-12-05 DIAGNOSIS — I5032 Chronic diastolic (congestive) heart failure: Secondary | ICD-10-CM | POA: Diagnosis not present

## 2022-12-05 LAB — LEVETIRACETAM LEVEL: Levetiracetam Lvl: 31.5 ug/mL (ref 10.0–40.0)

## 2022-12-05 LAB — GLUCOSE, CAPILLARY: Glucose-Capillary: 73 mg/dL (ref 70–99)

## 2022-12-05 MED ORDER — LEVETIRACETAM 500 MG PO TABS: 500.0000 mg | ORAL_TABLET | Freq: Two times a day (BID) | ORAL | Status: DC

## 2022-12-05 MED ORDER — LEVETIRACETAM 750 MG PO TABS: 750.0000 mg | ORAL_TABLET | Freq: Two times a day (BID) | ORAL | Status: DC

## 2022-12-05 MED ORDER — LEVETIRACETAM 250 MG PO TABS: 250.0000 mg | ORAL_TABLET | Freq: Two times a day (BID) | ORAL | Status: DC

## 2022-12-05 NOTE — Discharge Summary (Signed)
Physician Discharge Summary   Patient: Lori Petersen MRN: [id number] DOB: 29-Jan-1955  Admit date:     12/01/2022  Discharge date: 12/05/22  Discharge Physician: Marcelino Duster   PCP: Hillery Aldo, MD   Recommendations at discharge:    PCP follow up in 1 week. Neurology follow up regarding her seizure disorder.  Discharge Diagnoses: Principal Problem:   Seizure (HCC) Active Problems:   Chronic diastolic CHF (congestive heart failure) (HCC)   HLD (hyperlipidemia)   Hypothyroid   Mixed Alzheimer's and vascular dementia (HCC)   Down syndrome  Resolved Problems:   * No resolved hospital problems. [facility] Course: Lori Petersen is a 67 y.o. female with medical history significant of seizure, HLD, pre-DM, asthma, dCHF, hypothyroidism, GERD, Down syndrome, dementia, UTI, kidney stone, who presents with seizure.  Has baseline poor mental status.  Patient was loaded with IV Keppra, started on IV Keppra 750 twice daily.    Patient is admitted to hospitalist service for further management evaluation.  Patient had MRI brain which is unremarkable.  Keppra level is therapeutic.  EEG done and report is pending at this time.  During hospital stay she was reported to have urinary retention but did not require Foley catheter. Patient's mental status much improved and she is hemodynamically stable to be [facility].  Patient will need PCP follow-up and follow-up with neurology as scheduled.   Assessment and Plan: Seizure disorder No clear cause for breakthrough seizure. Mental status improved today, much more improved to her baseline. MRI brain rule unremarkable. No suspicion for infection. Keppra level therapeutic. EEG done.  Continue Keppra 750mg  bid. She will need to follow up with neurology for further management of seizure.  Chronic diastolic CHF (congestive heart failure) (HCC):  No exacerbation. CHF seem to be compensated. Avoid fluid overload.   HLD  (hyperlipidemia) Pravastatin restarted.   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  Active Pressure Injury/Wound(s)     Pressure Ulcer  Duration          Pressure Injury 03/24/21 Sacrum Medial Unstageable - Full thickness tissue loss in which the base of the injury is covered by slough (yellow, tan, gray, green or brown) and/or eschar (tan, brown or black) in the wound bed. 620 days   Pressure Injury 06/28/21 Buttocks Right;Left Deep Tissue Pressure Injury - Purple or maroon localized area of discolored intact skin or blood-filled blister due to damage of underlying soft tissue from pressure and/or shear. maroon patches 525 days   Pressure Injury 06/28/21 Hand Left Deep Tissue Pressure Injury - Purple or maroon localized area of discolored intact skin or blood-filled blister due to damage of underlying soft tissue from pressure and/or shear. 2 areas of dark purple red deep tissue  525 days   Pressure Injury 12/04/22 Sacrum Lower Stage 3 -  Full thickness tissue loss. Subcutaneous fat may be visible but bone, tendon or muscle are NOT exposed. updated from initial LDA entered, this is a pressure injury, chronic in nature 1 day          Pressure ulcer care as instructed.   Consultants: none Procedures performed: none  Disposition: Skilled nursing facility Diet recommendation:  Discharge Diet Orders (From admission, onward)     Start     Ordered   12/05/22 0000  Diet - low sodium heart healthy        12/05/22 1239  Cardiac diet, soft diet DISCHARGE MEDICATION: Allergies as of 12/05/2022   No Known Allergies      Medication List     STOP taking these medications    budesonide-formoterol 80-4.5 MCG/ACT inhaler Commonly known as: SYMBICORT   cephALEXin 500 MG capsule Commonly known as: Keflex   empagliflozin 10 MG Tabs  tablet Commonly known as: JARDIANCE   latanoprost 0.005 % ophthalmic solution Commonly known as: XALATAN   midazolam 5 MG/5ML Soln injection Commonly known as: VERSED       TAKE these medications    albuterol (2.5 MG/3ML) 0.083% nebulizer solution Commonly known as: PROVENTIL Take 2.5 mg by nebulization every 4 (four) hours as needed for wheezing or shortness of breath.   aspirin EC 81 MG tablet Take 81 mg by mouth daily.   budesonide 0.25 MG/2ML nebulizer solution Commonly known as: PULMICORT Take 0.25 mg by nebulization 2 (two) times daily.   cholecalciferol 25 MCG (1000 UNIT) tablet Commonly known as: VITAMIN D3 Take 2,000 Units by mouth daily.   Combigan 0.2-0.5 % ophthalmic solution Generic drug: brimonidine-timolol Place 1 drop into both eyes in the morning and at bedtime.   cyanocobalamin 1000 MCG tablet Commonly known as: VITAMIN B12 Take 1,000 mcg by mouth daily.   DIMETHICONE-ZINC OXIDE-VIT A-D EX Apply 1 Tube topically 3 (three) times daily. Apply to Gluteal areas during toileting.   levETIRAcetam 250 MG tablet Commonly known as: KEPPRA Take 250 mg by mouth 2 (two) times daily. (Take with 500mg  tablet to equal 750mg  total)   levETIRAcetam 500 MG tablet Commonly known as: KEPPRA Take 500 mg by mouth 2 (two) times daily. (Take with 250mg  tablet to equal 750mg  total)   levothyroxine 100 MCG tablet Commonly known as: SYNTHROID Take 100 mcg by mouth daily before breakfast.   omeprazole 20 MG capsule Commonly known as: PRILOSEC Take 20 mg by mouth daily.   Perforomist 20 MCG/2ML nebulizer solution Generic drug: formoterol Take 20 mcg by nebulization 2 (two) times daily.   pravastatin 40 MG tablet Commonly known as: PRAVACHOL Take 40 mg by mouth at bedtime.   prenatal vitamin w/FE, FA 27-1 MG Tabs tablet Take 1 tablet by mouth daily.   Santyl 250 UNIT/GM ointment Generic drug: collagenase Apply 1 Application topically daily. Sacral pressure  wound   senna 8.6 MG Tabs tablet Commonly known as: SENOKOT Take 2 tablets by mouth in the morning and at bedtime.   silver sulfADIAZINE 1 % cream Commonly known as: SILVADENE Apply 1 Application topically daily. Apply to left dorsal wrist   sodium hypochlorite 0.125 % Soln Commonly known as: DAKIN'S 1/4 STRENGTH Apply 1 Application topically daily. Monday-Friday   Zinc Oxide 13 % Crea Apply 1 application. topically 3 (three) times daily as needed (skin protection). Apply to gluteal area 3X daily with toileting - to protect skin   Desitin 13 % Crea Generic drug: Zinc Oxide Apply 1 application. topically in the morning and at bedtime.               Discharge Care Instructions  (From admission, onward)           Start     Ordered   12/05/22 0000  Discharge wound care:       Comments: Cleanse sacral wound with saline, pat dry Fill wound bed with silver hydrofiber (Aquacel Ag+) Lawson # [phone] Top with dry dressing, ABD pad, secure with tape.  Change daily.   12/05/22 1239  Follow-up Information     Hillery Aldo, MD Follow up in 1 week(s).   Specialty: Family Medicine Contact information: [address] [place] [address] [phone]                Discharge Exam: Ceasar Mons Weights   12/03/22 0500 12/04/22 0500 12/05/22 0500  Weight: 80 kg 83 kg 85 kg   General - Elderly African American female, able to answer yes or no. HEENT - PERRLA, EOMI, atraumatic head, non tender sinuses. Lung - distant breath sounds, diffuse rhonchi, no wheezes. Heart - S1, S2 heard, no murmurs, rubs, trace pedal edema. Abdomen - Soft, non tender, bowel sounds good Neuro - alert, awake, follows simple commands, able to move extremities Skin - Warm and dry.  Condition at discharge: stable  The results of significant diagnostics from this hospitalization (including imaging, microbiology, ancillary and laboratory) are listed below for reference.    Imaging Studies: MR BRAIN W WO CONTRAST  Result Date: 12/02/2022 CLINICAL DATA:  Mental status change, unknown cause. Seizures today. History of seizures, down syndrome, and dementia. EXAM: MRI HEAD WITHOUT AND WITH CONTRAST TECHNIQUE: Multiplanar, multiecho pulse sequences of the brain and surrounding structures were obtained without and with intravenous contrast. CONTRAST:  7mL GADAVIST GADOBUTROL 1 MMOL/ML IV SOLN COMPARISON:  Head CT 12/01/2022 and MRI 12/03/2018 FINDINGS: Multiple sequences are moderately to severely motion degraded. Brain: There is no evidence of an acute infarct, intracranial hemorrhage, mass, midline shift, or extra-axial fluid collection. Patchy T2 hyperintensities in the cerebral white matter and pons are grossly similar to the prior MRI and are nonspecific but compatible with moderate chronic small vessel ischemic disease. No abnormal intracranial enhancement is identified, however assessment is limited by motion. There is advanced cerebral atrophy, including severe bilateral mesial temporal lobe volume loss. An enlarged, empty sella is again noted. Vascular: Poorly assessed intracranial flow voids due to motion. Skull and upper cervical spine: No destructive skull lesion. Sinuses/Orbits: Grossly unremarkable. Other: None. IMPRESSION: 1. No acute abnormality identified within limitations of motion artifact. 2. Moderate chronic small vessel ischemic disease. 3. Advanced cerebral atrophy. Electronically Signed   By: Sebastian Ache M.D.   On: 12/02/2022 14:52   DG Abd 1 View  Result Date: 12/02/2022 CLINICAL DATA:  MRI clearance EXAM: ABDOMEN - 1 VIEW COMPARISON:  CT abdomen and pelvis dated 06/25/2021 FINDINGS: Postsurgical changes from mesh hernia repair. Nonobstructive bowel gas pattern. Large volume stool in the rectum and ascending colon. No free air or pneumatosis. No abnormal radio-opaque calculi or mass effect. No acute or substantial osseous abnormality. The sacrum and  coccyx are partially obscured by overlying bowel contents. IMPRESSION: 1. Postsurgical changes from mesh hernia repair. No other metallic foreign body identified. 2. Large volume stool in the rectum and ascending colon. Recommend correlation with fecal impaction. Electronically Signed   By: Agustin Cree M.D.   On: 12/02/2022 13:17   DG Chest 1 View  Result Date: 12/02/2022 CLINICAL DATA:  MRI clearance EXAM: CHEST  1 VIEW COMPARISON:  Chest radiograph dated 06/25/2021 FINDINGS: Patient is rotated slightly to the right. Low lung volumes with bronchovascular crowding. Patchy bibasilar opacities. Trace blunting of the bilateral costophrenic angles. No pneumothorax. Similar cardiomediastinal silhouette. Degenerative changes of the bilateral shoulders. IMPRESSION: 1. Low lung volumes with bronchovascular crowding. Patchy bibasilar opacities, likely atelectasis. 2. Trace blunting of the bilateral costophrenic angles, which may represent trace pleural effusions. 3. No unexpected metallic foreign body. Electronically Signed   By: Agustin Cree  M.D.   On: 12/02/2022 13:14   CT Head Wo Contrast  Result Date: 12/01/2022 CLINICAL DATA:  Seizure. Patient has been non communicative since the seizure. EXAM: CT HEAD WITHOUT CONTRAST TECHNIQUE: Contiguous axial images were obtained from the base of the skull through the vertex without intravenous contrast. RADIATION DOSE REDUCTION: This exam was performed according to the departmental dose-optimization program which includes automated exposure control, adjustment of the mA and/or kV according to patient size and/or use of iterative reconstruction technique. COMPARISON:  CT head without contrast 03/07/2022 FINDINGS: Brain: No acute infarct, hemorrhage, or mass lesion is present. Advanced atrophy and white matter disease is present. Remote lacunar infarcts are present within the basal ganglia bilaterally. The ventricles are proportionate to the degree of atrophy. No significant  extraaxial fluid collection is present. The brainstem and cerebellum are within normal limits. Enlarged empty sella is present. Midline structures are otherwise within normal limits. Vascular: Atherosclerotic calcifications are present. No hyperdense vessel is present. Skull: Calvarium is intact. No focal lytic or blastic lesions are present. No significant extracranial soft tissue lesion is present. The globes and orbits are within normal limits. The paranasal sinuses and mastoid air cells are clear. Sinuses/Orbits: The paranasal sinuses and mastoid air cells are clear. The globes and orbits are within normal limits. IMPRESSION: 1. No acute intracranial abnormality. 2. Advanced atrophy and white matter disease likely reflects the sequela of chronic microvascular ischemia. 3. Remote lacunar infarcts within the basal ganglia bilaterally. Electronically Signed   By: Marin Roberts M.D.   On: 12/01/2022 15:43    Microbiology: Results for orders placed or performed during the hospital encounter of 06/25/21  Resp Panel by RT-PCR (Flu A&B, Covid) Anterior Nasal Swab     Status: None   Collection Time: 06/25/21 11:19 AM   Specimen: Anterior Nasal Swab  Result Value Ref Range Status   SARS Coronavirus 2 by RT PCR NEGATIVE NEGATIVE Final    Comment: (NOTE) SARS-CoV-2 target nucleic acids are NOT DETECTED.  The SARS-CoV-2 RNA is generally detectable in upper respiratory specimens during the acute phase of infection. The lowest concentration of SARS-CoV-2 viral copies this assay can detect is 138 copies/mL. A negative result does not preclude SARS-Cov-2 infection and should not be used as the sole basis for treatment or other patient management decisions. A negative result may occur with  improper specimen collection/handling, submission of specimen other than nasopharyngeal swab, presence of viral mutation(s) within the areas targeted by this assay, and inadequate number of viral copies(<138  copies/mL). A negative result must be combined with clinical observations, patient history, and epidemiological information. The expected result is Negative.  Fact Sheet for Patients:  BloggerCourse.com  Fact Sheet for Healthcare Providers:  SeriousBroker.it  This test is no t yet approved or cleared by the [place] FDA and  has been authorized for detection and/or diagnosis of SARS-CoV-2 by FDA under an Emergency Use Authorization (EUA). This EUA will remain  in effect (meaning this test can be used) for the duration of the COVID-19 declaration under Section 564(b)(1) of the Act, 21 U.S.C.section 360bbb-3(b)(1), unless the authorization is terminated  or revoked sooner.       Influenza A by PCR NEGATIVE NEGATIVE Final   Influenza B by PCR NEGATIVE NEGATIVE Final    Comment: (NOTE) The Xpert Xpress SARS-CoV-2/FLU/RSV plus assay is intended as an aid in the diagnosis of influenza from Nasopharyngeal swab specimens and should not be used as a sole basis for treatment. Nasal washings and  aspirates are unacceptable for Xpert Xpress SARS-CoV-2/FLU/RSV testing.  Fact Sheet for Patients: BloggerCourse.com  Fact Sheet for Healthcare Providers: SeriousBroker.it  This test is not yet approved or cleared by the [place] FDA and has been authorized for detection and/or diagnosis of SARS-CoV-2 by FDA under an Emergency Use Authorization (EUA). This EUA will remain in effect (meaning this test can be used) for the duration of the COVID-19 declaration under Section 564(b)(1) of the Act, 21 U.S.C. section 360bbb-3(b)(1), unless the authorization is terminated or revoked.  Performed at [facility], [address] [place], [place] [address]   Blood Culture (routine x 2)     Status: Abnormal   Collection Time: 06/25/21 11:20 AM   Specimen: BLOOD RIGHT HAND  Result  Value Ref Range Status   Specimen Description   Final    BLOOD RIGHT HAND Performed at [facility], [address] [place], [place] [address]    Special Requests   Final    BOTTLES DRAWN AEROBIC AND ANAEROBIC Blood Culture results may not be optimal due to an inadequate volume of blood received in culture bottles Performed at [facility], [address] [place], [place] [address]    Culture  Setup Time   Final    IN BOTH AEROBIC AND ANAEROBIC BOTTLES GRAM NEGATIVE RODS CRITICAL RESULT CALLED TO, READ BACK BY AND VERIFIED WITH: RODNEY GRUBB 06/26/21 @ 0907 BY SB    Culture ESCHERICHIA COLI (A)  Final   Report Status 06/28/2021 FINAL  Final   Organism ID, Bacteria ESCHERICHIA COLI  Final      Susceptibility   Escherichia coli - MIC*    AMPICILLIN >=32 RESISTANT Resistant     CEFAZOLIN <=4 SENSITIVE Sensitive     CEFEPIME <=0.12 SENSITIVE Sensitive     CEFTAZIDIME <=1 SENSITIVE Sensitive     CEFTRIAXONE <=0.25 SENSITIVE Sensitive     CIPROFLOXACIN >=4 RESISTANT Resistant     GENTAMICIN <=1 SENSITIVE Sensitive     IMIPENEM <=0.25 SENSITIVE Sensitive     TRIMETH/SULFA <=20 SENSITIVE Sensitive     AMPICILLIN/SULBACTAM 8 SENSITIVE Sensitive     PIP/TAZO <=4 SENSITIVE Sensitive     * ESCHERICHIA COLI  Blood Culture ID Panel (Reflexed)     Status: Abnormal   Collection Time: 06/25/21 11:20 AM  Result Value Ref Range Status   Enterococcus faecalis NOT DETECTED NOT DETECTED Final   Enterococcus Faecium NOT DETECTED NOT DETECTED Final   Listeria monocytogenes NOT DETECTED NOT DETECTED Final   Staphylococcus species NOT DETECTED NOT DETECTED Final   Staphylococcus aureus (BCID) NOT DETECTED NOT DETECTED Final   Staphylococcus epidermidis NOT DETECTED NOT DETECTED Final   Staphylococcus lugdunensis NOT DETECTED NOT DETECTED Final   Streptococcus species NOT DETECTED NOT DETECTED Final   Streptococcus agalactiae NOT DETECTED NOT DETECTED Final   Streptococcus  pneumoniae NOT DETECTED NOT DETECTED Final   Streptococcus pyogenes NOT DETECTED NOT DETECTED Final   A.calcoaceticus-baumannii NOT DETECTED NOT DETECTED Final   Bacteroides fragilis NOT DETECTED NOT DETECTED Final   Enterobacterales DETECTED (A) NOT DETECTED Final    Comment: Enterobacterales represent a large order of gram negative bacteria, not a single organism. CRITICAL RESULT CALLED TO, READ BACK BY AND VERIFIED WITH: RODNEY GRUBB 06/26/21 @ 0907 BY SB    Enterobacter cloacae complex NOT DETECTED NOT DETECTED Final   Escherichia coli DETECTED (A) NOT DETECTED Final    Comment: CRITICAL RESULT CALLED TO, READ BACK BY AND VERIFIED WITH: RODNEY GRUBB 06/26/21 @  4098 BY SB    Klebsiella aerogenes NOT DETECTED NOT DETECTED Final   Klebsiella oxytoca NOT DETECTED NOT DETECTED Final   Klebsiella pneumoniae NOT DETECTED NOT DETECTED Final   Proteus species NOT DETECTED NOT DETECTED Final   Salmonella species NOT DETECTED NOT DETECTED Final   Serratia marcescens NOT DETECTED NOT DETECTED Final   Haemophilus influenzae NOT DETECTED NOT DETECTED Final   Neisseria meningitidis NOT DETECTED NOT DETECTED Final   Pseudomonas aeruginosa NOT DETECTED NOT DETECTED Final   Stenotrophomonas maltophilia NOT DETECTED NOT DETECTED Final   Candida albicans NOT DETECTED NOT DETECTED Final   Candida auris NOT DETECTED NOT DETECTED Final   Candida glabrata NOT DETECTED NOT DETECTED Final   Candida krusei NOT DETECTED NOT DETECTED Final   Candida parapsilosis NOT DETECTED NOT DETECTED Final   Candida tropicalis NOT DETECTED NOT DETECTED Final   Cryptococcus neoformans/gattii NOT DETECTED NOT DETECTED Final   CTX-M ESBL NOT DETECTED NOT DETECTED Final   Carbapenem resistance IMP NOT DETECTED NOT DETECTED Final   Carbapenem resistance KPC NOT DETECTED NOT DETECTED Final   Carbapenem resistance NDM NOT DETECTED NOT DETECTED Final   Carbapenem resist OXA 48 LIKE NOT DETECTED NOT DETECTED Final   Carbapenem  resistance VIM NOT DETECTED NOT DETECTED Final    Comment: Performed at [facility], [address] [place], [place] [address]  Blood Culture (routine x 2)     Status: None   Collection Time: 06/25/21 11:21 AM   Specimen: BLOOD  Result Value Ref Range Status   Specimen Description BLOOD RIGHT ANTECUBITAL  Final   Special Requests   Final    BOTTLES DRAWN AEROBIC AND ANAEROBIC Blood Culture results may not be optimal due to an excessive volume of blood received in culture bottles   Culture   Final    NO GROWTH 5 DAYS Performed at [facility], [name] [place], [place] [address]    Report Status 06/30/2021 FINAL  Final  Urine Culture     Status: Abnormal   Collection Time: 06/25/21  1:35 PM   Specimen: In/Out Cath Urine  Result Value Ref Range Status   Specimen Description   Final    IN/OUT CATH URINE Performed at [facility], [address], [place], [place] [address]    Special Requests   Final    NONE Performed at [facility], [address] [place], [place] [address]    Culture 2,000 COLONIES/mL YEAST (A)  Final   Report Status 06/27/2021 FINAL  Final  MRSA Next Gen by PCR, Nasal     Status: None   Collection Time: 06/25/21  2:21 PM   Specimen: Nasal Mucosa; Nasal Swab  Result Value Ref Range Status   MRSA by PCR Next Gen NOT DETECTED NOT DETECTED Final    Comment: (NOTE) The GeneXpert MRSA Assay (FDA approved for NASAL specimens only), is one component of a comprehensive MRSA colonization surveillance program. It is not intended to diagnose MRSA infection nor to guide or monitor treatment for MRSA infections. Test performance is not FDA approved in patients less than 9 years old. Performed at [facility], [address] [place], [place] [address]   Aerobic/Anaerobic Culture w Gram Stain (surgical/deep wound)     Status: None   Collection Time: 06/25/21  4:46 PM   Specimen: PATH Other; Tissue  Result  Value Ref Range Status   Specimen Description   Final    URINE, RANDOM URINE FROM LEFT  RENAL PELVIS Performed at [facility], [address] [place], [place] [address]    Special Requests   Final    NONE Performed at [facility], [address] [place], [place] [address]    Gram Stain   Final    RARE WBC PRESENT, PREDOMINANTLY MONONUCLEAR NO ORGANISMS SEEN    Culture   Final    RARE CANDIDA ALBICANS NO ANAEROBES ISOLATED Performed at [facility], [address], [place], [place] [address]    Report Status 07/02/2021 FINAL  Final    Labs: CBC: Recent Labs  Lab 12/01/22 1553 12/02/22 0515  WBC 4.9 4.5  NEUTROABS 2.7  --   HGB 13.2 11.5*  HCT 42.1 36.8  MCV 103.7* 104.2*  PLT 193 166   Basic Metabolic Panel: Recent Labs  Lab 12/01/22 2045 12/02/22 0515  NA 142 143  K 3.6 3.6  CL 106 107  CO2 29 24  GLUCOSE 81 84  BUN 15 15  CREATININE 0.99 0.85  CALCIUM 8.5* 8.4*   Liver Function Tests: Recent Labs  Lab 12/01/22 2045  AST 18  ALT 16  ALKPHOS 45  BILITOT 0.6  PROT 7.1  ALBUMIN 3.2*   CBG: Recent Labs  Lab 12/03/22 0928 12/03/22 1041 12/04/22 0147 12/04/22 0834 12/05/22 0922  GLUCAP 65* 86 116* 100* 73    Discharge time spent:  36 minutes.  Signed: Marcelino Duster, MD [facility] 12/05/2022

## 2022-12-05 NOTE — TOC Progression Note (Signed)
Transition of Care [facility]) - Progression Note    Patient Details  Name: Lori Petersen [name] MRN: [id number] Date of Birth: 1955-12-30  Transition of Care [name]) CM/SW Contact  Marlowe Sax, RN Phone Number: 12/05/2022, 2:00 PM  Clinical Narrative:    [facility] Patient Charisse [facility] with [facility]  is aware EMS called and arranged transport    Expected Discharge Plan: Long Term Nursing Home Barriers to Discharge: Continued Medical Work up  Expected Discharge Plan and Services   Discharge Planning Services: CM Consult   Living arrangements for the past 2 months: Skilled Nursing Facility Expected Discharge Date: 12/05/22                 DME Agency: NA       HH Arranged: NA           Social Determinants of Health (SDOH) Interventions SDOH Screenings   Food Insecurity: Patient Unable To Answer (12/05/2022)  Housing: Patient Unable To Answer (12/05/2022)  Transportation Needs: Patient Unable To Answer (12/05/2022)  Utilities: Patient Unable To Answer (12/05/2022)  Tobacco Use: Low Risk  (08/28/2022)   Received from [name]    Readmission Risk Interventions    06/29/2021   11:13 AM 06/27/2021   11:00 AM  Readmission Risk Prevention Plan  Transportation Screening Complete Complete  PCP or Specialist Appt within 3-5 Days Complete   HRI or Home Care Consult Complete   Social Work Consult for Recovery Care Planning/Counseling Complete   Palliative Care Screening Complete   Medication Review [employer]) Complete Complete  PCP or Specialist appointment within 3-5 days of discharge  Complete  HRI or Home Care Consult  Complete  SW Recovery Care/Counseling Consult  Not Complete  SW Consult Not Complete Comments  NA  Palliative Care Screening  Complete  Skilled Nursing Facility  Complete

## 2022-12-05 NOTE — Plan of Care (Signed)
  Problem: Education: Goal: Knowledge of General Education information will improve Description: Including pain rating scale, medication(s)/side effects and non-pharmacologic comfort measures Outcome: Adequate for Discharge   Problem: Health Behavior/Discharge Planning: Goal: Ability to manage health-related needs will improve Outcome: Adequate for Discharge   Problem: Clinical Measurements: Goal: Ability to maintain clinical measurements within normal limits will improve Outcome: Adequate for Discharge Goal: Will remain free from infection Outcome: Adequate for Discharge Goal: Diagnostic test results will improve Outcome: Adequate for Discharge Goal: Respiratory complications will improve Outcome: Adequate for Discharge Goal: Cardiovascular complication will be avoided Outcome: Adequate for Discharge   Problem: Activity: Goal: Risk for activity intolerance will decrease Outcome: Adequate for Discharge   Problem: Nutrition: Goal: Adequate nutrition will be maintained Outcome: Adequate for Discharge   Problem: Coping: Goal: Level of anxiety will decrease Outcome: Adequate for Discharge   Problem: Elimination: Goal: Will not experience complications related to bowel motility Outcome: Adequate for Discharge Goal: Will not experience complications related to urinary retention Outcome: Adequate for Discharge   Problem: Pain Management: Goal: General experience of comfort will improve Outcome: Adequate for Discharge   Problem: Safety: Goal: Ability to remain free from injury will improve Outcome: Adequate for Discharge   Problem: Skin Integrity: Goal: Risk for impaired skin integrity will decrease Outcome: Adequate for Discharge   Problem: Education: Goal: Expressions of having a comfortable level of knowledge regarding the disease process will increase Outcome: Adequate for Discharge   Problem: Coping: Goal: Ability to adjust to condition or change in health will  improve Outcome: Adequate for Discharge Goal: Ability to identify appropriate support needs will improve Outcome: Adequate for Discharge   Problem: Health Behavior/Discharge Planning: Goal: Compliance with prescribed medication regimen will improve Outcome: Adequate for Discharge   Problem: Medication: Goal: Risk for medication side effects will decrease Outcome: Adequate for Discharge   Problem: Clinical Measurements: Goal: Complications related to the disease process, condition or treatment will be avoided or minimized Outcome: Adequate for Discharge Goal: Diagnostic test results will improve Outcome: Adequate for Discharge   Problem: Safety: Goal: Verbalization of understanding the information provided will improve Outcome: Adequate for Discharge   Problem: Self-Concept: Goal: Level of anxiety will decrease Outcome: Adequate for Discharge Goal: Ability to verbalize feelings about condition will improve Outcome: Adequate for Discharge

## 2022-12-05 NOTE — Care Management Important Message (Signed)
Important Message  Patient Details  Name: Lori Petersen MRN: [id number] Date of Birth: 02-Dec-1955   Important Message Given:  Yes - Medicare IM     Bernadette Hoit 12/05/2022, 10:37 AM

## 2022-12-05 NOTE — ED Provider Notes (Signed)
Routine EEG Report  Lori Petersen is a 67 y.o. female with a history of seizure who is undergoing an EEG to evaluate for seizures.  Report: This EEG was acquired with electrodes placed according to the International 10-20 electrode system (including Fp1, Fp2, F3, F4, C3, C4, P3, P4, O1, O2, T3, T4, T5, T6, A1, A2, Fz, Cz, Pz). The following electrodes were missing or displaced: none.  The occipital dominant rhythm was 6 Hz. There was focal slowing in the bifrontal regions. This activity is reactive to stimulation. Drowsiness was manifested by background fragmentation; deeper stages of sleep were identified by K complexes and sleep spindles. There was no focal slowing. There were no interictal epileptiform discharges. There were no electrographic seizures identified. There was no abnormal response to photic stimulation or hyperventilation.   Impression and clinical correlation: This EEG was obtained while awake and asleep and is abnormal due to moderate-to-severe diffuse slowing indicative of global cerebral dysfunction. Epileptiform abnormalities were not seen during this recording.  Bing Neighbors, MD [facility] [phone]  If 7pm- 7am, please page neurology on call as listed in AMION.

## 2022-12-05 NOTE — Plan of Care (Signed)
  Problem: Education: Goal: Knowledge of General Education information will improve Description: Including pain rating scale, medication(s)/side effects and non-pharmacologic comfort measures Outcome: Not Progressing   

## 2022-12-10 NOTE — Procedures (Signed)
Routine EEG Report   Lori Petersen is a 67 y.o. female with a history of seizure who is undergoing an EEG to evaluate for seizures.   Report: This EEG was acquired with electrodes placed according to the International 10-20 electrode system (including Fp1, Fp2, F3, F4, C3, C4, P3, P4, O1, O2, T3, T4, T5, T6, A1, A2, Fz, Cz, Pz). The following electrodes were missing or displaced: none.   The occipital dominant rhythm was 6 Hz. There was focal slowing in the bifrontal regions. This activity is reactive to stimulation. Drowsiness was manifested by background fragmentation; deeper stages of sleep were identified by K complexes and sleep spindles. There was no focal slowing. There were no interictal epileptiform discharges. There were no electrographic seizures identified. There was no abnormal response to photic stimulation or hyperventilation.    Impression and clinical correlation: This EEG was obtained while awake and asleep and is abnormal due to moderate-to-severe diffuse slowing indicative of global cerebral dysfunction. Epileptiform abnormalities were not seen during this recording.   Bing Neighbors, MD [facility] [phone]   If 7pm- 7am, please page neurology on call as listed in AMION.  Please note this report was accidentally entered on 12/05/22 as an ED provider note instead of a procedure note. I realized this today 12/10/22 and corrected the error.

## 2023-01-03 ENCOUNTER — Emergency Department
Admission: EM | Admit: 2023-01-03 | Discharge: 2023-01-04 | Disposition: A | Discharge status: Home / Self Care | Payer: Medicare (Managed Care) | Attending: Emergency Medicine | Admitting: Emergency Medicine

## 2023-01-03 ENCOUNTER — Other Ambulatory Visit: Payer: Self-pay

## 2023-01-03 DIAGNOSIS — F0390 Unspecified dementia without behavioral disturbance: Secondary | ICD-10-CM | POA: Insufficient documentation

## 2023-01-03 DIAGNOSIS — R569 Unspecified convulsions: Secondary | ICD-10-CM | POA: Diagnosis present

## 2023-01-03 LAB — BASIC METABOLIC PANEL
Anion gap: 12 (ref 5–15)
BUN: 13 mg/dL (ref 8–23)
CO2: 26 mmol/L (ref 22–32)
Calcium: 8.9 mg/dL (ref 8.9–10.3)
Chloride: 104 mmol/L (ref 98–111)
Creatinine, Ser: 0.95 mg/dL (ref 0.44–1.00)
GFR, Estimated: >60 mL/min (ref >60)
Glucose, Bld: 84 mg/dL (ref 70–99)
Potassium: 3.8 mmol/L (ref 3.5–5.1)
Sodium: 142 mmol/L (ref 135–145)

## 2023-01-03 LAB — CBC
HCT: 38.6 % (ref 36.0–46.0)
Hemoglobin: 12.2 g/dL (ref 12.0–15.0)
MCH: 32.1 pg (ref 26.0–34.0)
MCHC: 31.6 g/dL (ref 30.0–36.0)
MCV: 101.6 fL — ABNORMAL HIGH (ref 80.0–100.0)
Platelets: 234 10*3/uL (ref 150–400)
RBC: 3.8 MIL/uL — ABNORMAL LOW (ref 3.87–5.11)
RDW: 14.2 % (ref 11.5–15.5)
WBC: 7.9 10*3/uL (ref 4.0–10.5)
nRBC: 0 % (ref 0.0–0.2)

## 2023-01-03 NOTE — ED Notes (Addendum)
Pt doc from [facility] called and would like update and any direction on what needs to be done for follow up. [phone] Lori Petersen to give an update on patient and get baseline idea of patient. 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 Never is fully A&Ox4 mostly chuckles/smiles and has speech impediment. Patient is from [facility]. Pt does not ambulate and does have a sacrum wound on buttocks MD stated is like 80% healed. Also MD informed patient sister is worsening and has health problems herself. States patient has legal guardian but unsure if that sister is the one to have guardianship.

## 2023-01-03 NOTE — Discharge Instructions (Signed)
Lab test were okay today, and there were no further seizures in the emergency department.  Continue usual medications and follow-up with primary care.

## 2023-01-03 NOTE — ED Notes (Signed)
CALLED [facility] SPOKE WITH REP. ASHLEY SHE STATED SHE HAS PUT THE PT. ON THE LIST TO BE PICKED UP.

## 2023-01-03 NOTE — ED Triage Notes (Signed)
EMS reports patient had 3 witnessed seizures at [facility]; history of same but after 3rd seizure, BP decreased.

## 2023-01-03 NOTE — ED Notes (Signed)
Pt doc from [facility] called and would like update and any direction on what needs to be done for follow up. [phone] [facility]

## 2023-01-03 NOTE — ED Provider Notes (Signed)
[facility] Provider Note    Event Date/Time   First MD Initiated Contact with Patient 01/03/23 1938     (approximate)   History   Chief Complaint: Seizures   HPI  Lori Petersen is a 67 y.o. female with a history of severe chronic dementia, Down syndrome, epilepsy who was brought to the ED due to seizure, witnessed at [facility].  Was in her usual state of health prior to this.  Sent to the ED due to concern for low blood pressure after a third seizure.  Currently cared for in a SNF.          Physical Exam   Triage Vital Signs: ED Triage Vitals  Encounter Vitals Group     BP 01/03/23 1537 (!) 141/85     Systolic BP Percentile --      Diastolic BP Percentile --      Pulse Rate 01/03/23 1528 80     Resp 01/03/23 1528 18     Temp 01/03/23 1528 98.1 F (36.7 C)     Temp Source 01/03/23 1528 Oral     SpO2 01/03/23 1528 100 %     Weight --      Height --      Head Circumference --      [facility] Flow --      Pain Score --      Pain Loc --      Pain Education --      Exclude from Growth Chart --     Most recent vital signs: Vitals:   01/03/23 2300 01/03/23 2315  BP: 95/65 95/70  Pulse: (!) 55 (!) 54  Resp: (!) 21 20  Temp:  98.1 F (36.7 C)  SpO2: 98% 97%    General: Somnolent, arousable, no distress.  CV:  Good peripheral perfusion.  Regular rate rhythm Resp:  Normal effort.  Clear to auscultation bilaterally Abd:  No distention.  Soft nontender Other:  No wounds.   ED Results / Procedures / Treatments   Labs (all labs ordered are listed, but only abnormal results are displayed) Labs Reviewed  CBC - Abnormal; Notable for the following components:      Result Value   RBC 3.80 (*)    MCV 101.6 (*)    All other components within normal limits  BASIC METABOLIC PANEL  CBG MONITORING, ED     EKG    RADIOLOGY    PROCEDURES:  Procedures   MEDICATIONS ORDERED IN ED: Medications - No data to  display   IMPRESSION / MDM / ASSESSMENT AND PLAN / ED COURSE  I reviewed the triage vital signs and the nursing notes.  DDx: Anemia, electrolyte abnormality, AKI, epilepsy  Patient's presentation is most consistent with acute presentation with potential threat to life or bodily function.  Patient currently somnolent.  Suspect that she has received IM benzodiazepines with her seizures.  Will observe in the ED for recurrent seizures.  Doubt subclinical status.  She is arousable with stimulation but goes back to sleep.  Clinical Course as of 01/03/23 2330  Wed Jan 03, 2023  2324 Remained stable in the ED without any further seizure activity.  Labs are unremarkable.  With her severe chronic health issues, I do not think further neurologic workup or hospitalization will be of benefit.  [facility] with [facility].  Suitable for discharge home at this time. [PS]    Clinical Course User Index [PS] Sharman Cheek, MD  FINAL CLINICAL IMPRESSION(S) / ED DIAGNOSES   Final diagnoses:  Seizure (HCC)  Chronic dementia (HCC)     Rx / DC Orders   ED Discharge Orders     None        Note:  This document was prepared using Dragon voice recognition software and may include unintentional dictation errors.   Sharman Cheek, MD 01/03/23 2330

## 2023-02-16 ENCOUNTER — Emergency Department: Payer: Medicare (Managed Care)

## 2023-02-16 ENCOUNTER — Other Ambulatory Visit: Payer: Self-pay

## 2023-02-16 ENCOUNTER — Inpatient Hospital Stay
Admission: EM | Admit: 2023-02-16 | Discharge: 2023-02-21 | DRG: 100 | Disposition: A | Discharge status: Skilled Nursing Facility | Payer: Medicare (Managed Care) | Source: Skilled Nursing Facility | Attending: Osteopathic Medicine | Admitting: Osteopathic Medicine

## 2023-02-16 DIAGNOSIS — G309 Alzheimer's disease, unspecified: Secondary | ICD-10-CM | POA: Diagnosis present

## 2023-02-16 DIAGNOSIS — F0280 Dementia in other diseases classified elsewhere without behavioral disturbance: Secondary | ICD-10-CM | POA: Diagnosis present

## 2023-02-16 DIAGNOSIS — L89154 Pressure ulcer of sacral region, stage 4: Secondary | ICD-10-CM | POA: Diagnosis present

## 2023-02-16 DIAGNOSIS — G40919 Epilepsy, unspecified, intractable, without status epilepticus: Secondary | ICD-10-CM

## 2023-02-16 DIAGNOSIS — F800 Phonological disorder: Secondary | ICD-10-CM | POA: Diagnosis present

## 2023-02-16 DIAGNOSIS — I5032 Chronic diastolic (congestive) heart failure: Secondary | ICD-10-CM | POA: Diagnosis present

## 2023-02-16 DIAGNOSIS — N390 Urinary tract infection, site not specified: Secondary | ICD-10-CM | POA: Diagnosis present

## 2023-02-16 DIAGNOSIS — Z7982 Long term (current) use of aspirin: Secondary | ICD-10-CM

## 2023-02-16 DIAGNOSIS — J9811 Atelectasis: Secondary | ICD-10-CM | POA: Diagnosis present

## 2023-02-16 DIAGNOSIS — E66811 Obesity, class 1: Secondary | ICD-10-CM | POA: Diagnosis present

## 2023-02-16 DIAGNOSIS — E870 Hyperosmolality and hypernatremia: Secondary | ICD-10-CM | POA: Diagnosis present

## 2023-02-16 DIAGNOSIS — G9341 Metabolic encephalopathy: Secondary | ICD-10-CM | POA: Diagnosis present

## 2023-02-16 DIAGNOSIS — R7303 Prediabetes: Secondary | ICD-10-CM | POA: Diagnosis present

## 2023-02-16 DIAGNOSIS — I959 Hypotension, unspecified: Secondary | ICD-10-CM | POA: Diagnosis present

## 2023-02-16 DIAGNOSIS — Z8619 Personal history of other infectious and parasitic diseases: Secondary | ICD-10-CM

## 2023-02-16 DIAGNOSIS — F0390 Unspecified dementia without behavioral disturbance: Secondary | ICD-10-CM

## 2023-02-16 DIAGNOSIS — J4520 Mild intermittent asthma, uncomplicated: Secondary | ICD-10-CM | POA: Diagnosis present

## 2023-02-16 DIAGNOSIS — G4733 Obstructive sleep apnea (adult) (pediatric): Secondary | ICD-10-CM | POA: Diagnosis present

## 2023-02-16 DIAGNOSIS — Z9889 Other specified postprocedural states: Secondary | ICD-10-CM

## 2023-02-16 DIAGNOSIS — N179 Acute kidney failure, unspecified: Secondary | ICD-10-CM | POA: Diagnosis present

## 2023-02-16 DIAGNOSIS — Z87440 Personal history of urinary (tract) infections: Secondary | ICD-10-CM

## 2023-02-16 DIAGNOSIS — H409 Unspecified glaucoma: Secondary | ICD-10-CM | POA: Diagnosis present

## 2023-02-16 DIAGNOSIS — K219 Gastro-esophageal reflux disease without esophagitis: Secondary | ICD-10-CM | POA: Diagnosis present

## 2023-02-16 DIAGNOSIS — R569 Unspecified convulsions: Secondary | ICD-10-CM | POA: Diagnosis present

## 2023-02-16 DIAGNOSIS — L89322 Pressure ulcer of left buttock, stage 2: Secondary | ICD-10-CM | POA: Diagnosis present

## 2023-02-16 DIAGNOSIS — Z87442 Personal history of urinary calculi: Secondary | ICD-10-CM

## 2023-02-16 DIAGNOSIS — E039 Hypothyroidism, unspecified: Secondary | ICD-10-CM | POA: Diagnosis present

## 2023-02-16 DIAGNOSIS — G40909 Epilepsy, unspecified, not intractable, without status epilepticus: Principal | ICD-10-CM | POA: Diagnosis present

## 2023-02-16 DIAGNOSIS — L89153 Pressure ulcer of sacral region, stage 3: Secondary | ICD-10-CM | POA: Diagnosis present

## 2023-02-16 DIAGNOSIS — Q909 Down syndrome, unspecified: Secondary | ICD-10-CM | POA: Diagnosis not present

## 2023-02-16 DIAGNOSIS — K760 Fatty (change of) liver, not elsewhere classified: Secondary | ICD-10-CM | POA: Diagnosis present

## 2023-02-16 DIAGNOSIS — L89611 Pressure ulcer of right heel, stage 1: Secondary | ICD-10-CM | POA: Diagnosis present

## 2023-02-16 DIAGNOSIS — F0150 Vascular dementia without behavioral disturbance: Secondary | ICD-10-CM | POA: Diagnosis present

## 2023-02-16 DIAGNOSIS — E785 Hyperlipidemia, unspecified: Secondary | ICD-10-CM | POA: Diagnosis present

## 2023-02-16 DIAGNOSIS — Z79899 Other long term (current) drug therapy: Secondary | ICD-10-CM

## 2023-02-16 DIAGNOSIS — N3090 Cystitis, unspecified without hematuria: Secondary | ICD-10-CM

## 2023-02-16 DIAGNOSIS — G9340 Encephalopathy, unspecified: Secondary | ICD-10-CM | POA: Diagnosis not present

## 2023-02-16 DIAGNOSIS — M47816 Spondylosis without myelopathy or radiculopathy, lumbar region: Secondary | ICD-10-CM | POA: Diagnosis present

## 2023-02-16 DIAGNOSIS — M48061 Spinal stenosis, lumbar region without neurogenic claudication: Secondary | ICD-10-CM | POA: Diagnosis present

## 2023-02-16 DIAGNOSIS — E875 Hyperkalemia: Secondary | ICD-10-CM | POA: Diagnosis not present

## 2023-02-16 DIAGNOSIS — M5030 Other cervical disc degeneration, unspecified cervical region: Secondary | ICD-10-CM | POA: Diagnosis present

## 2023-02-16 DIAGNOSIS — K2270 Barrett's esophagus without dysplasia: Secondary | ICD-10-CM | POA: Diagnosis present

## 2023-02-16 DIAGNOSIS — E876 Hypokalemia: Secondary | ICD-10-CM | POA: Diagnosis not present

## 2023-02-16 DIAGNOSIS — Z8701 Personal history of pneumonia (recurrent): Secondary | ICD-10-CM

## 2023-02-16 DIAGNOSIS — H269 Unspecified cataract: Secondary | ICD-10-CM | POA: Diagnosis present

## 2023-02-16 DIAGNOSIS — Z6832 Body mass index (BMI) 32.0-32.9, adult: Secondary | ICD-10-CM

## 2023-02-16 DIAGNOSIS — Z79890 Hormone replacement therapy (postmenopausal): Secondary | ICD-10-CM

## 2023-02-16 DIAGNOSIS — Z7951 Long term (current) use of inhaled steroids: Secondary | ICD-10-CM

## 2023-02-16 LAB — COMPREHENSIVE METABOLIC PANEL
ALT: 25 U/L (ref 0–44)
AST: 26 U/L (ref 15–41)
Albumin: 3.4 g/dL — ABNORMAL LOW (ref 3.5–5.0)
Alkaline Phosphatase: 43 U/L (ref 38–126)
Anion gap: 13 (ref 5–15)
BUN: 18 mg/dL (ref 8–23)
CO2: 26 mmol/L (ref 22–32)
Calcium: 8.9 mg/dL (ref 8.9–10.3)
Chloride: 112 mmol/L — ABNORMAL HIGH (ref 98–111)
Creatinine, Ser: 1.03 mg/dL — ABNORMAL HIGH (ref 0.44–1.00)
GFR, Estimated: 60 mL/min — ABNORMAL LOW (ref >60)
Glucose, Bld: 109 mg/dL — ABNORMAL HIGH (ref 70–99)
Potassium: 3.9 mmol/L (ref 3.5–5.1)
Sodium: 151 mmol/L — ABNORMAL HIGH (ref 135–145)
Total Bilirubin: 0.4 mg/dL (ref 0.0–1.2)
Total Protein: 7.6 g/dL (ref 6.5–8.1)

## 2023-02-16 LAB — BASIC METABOLIC PANEL
Anion gap: 10 (ref 5–15)
BUN: 15 mg/dL (ref 8–23)
CO2: 28 mmol/L (ref 22–32)
Calcium: 8.5 mg/dL — ABNORMAL LOW (ref 8.9–10.3)
Chloride: 111 mmol/L (ref 98–111)
Creatinine, Ser: 0.96 mg/dL (ref 0.44–1.00)
GFR, Estimated: >60 mL/min (ref >60)
Glucose, Bld: 89 mg/dL (ref 70–99)
Potassium: 3.7 mmol/L (ref 3.5–5.1)
Sodium: 149 mmol/L — ABNORMAL HIGH (ref 135–145)

## 2023-02-16 LAB — URINALYSIS, W/ REFLEX TO CULTURE (INFECTION SUSPECTED)
Bilirubin Urine: NEGATIVE
Glucose, UA: NEGATIVE mg/dL
Hgb urine dipstick: NEGATIVE
Ketones, ur: NEGATIVE mg/dL
Nitrite: NEGATIVE
Protein, ur: 30 mg/dL — AB
Specific Gravity, Urine: 1.021 (ref 1.005–1.030)
pH: 6 (ref 5.0–8.0)

## 2023-02-16 LAB — BLOOD GAS, VENOUS
Acid-Base Excess: 4 mmol/L — ABNORMAL HIGH (ref 0.0–2.0)
Bicarbonate: 31.6 mmol/L — ABNORMAL HIGH (ref 20.0–28.0)
O2 Saturation: 69.6 %
Patient temperature: 37
pCO2, Ven: 60 mm[Hg] (ref 44–60)
pH, Ven: 7.33 (ref 7.25–7.43)
pO2, Ven: 44 mm[Hg] (ref 32–45)

## 2023-02-16 LAB — CBC WITH DIFFERENTIAL/PLATELET
Abs Immature Granulocytes: 0.09 10*3/uL — ABNORMAL HIGH (ref 0.00–0.07)
Basophils Absolute: 0.1 10*3/uL (ref 0.0–0.1)
Basophils Relative: 1 %
Eosinophils Absolute: 0.1 10*3/uL (ref 0.0–0.5)
Eosinophils Relative: 1 %
HCT: 38 % (ref 36.0–46.0)
Hemoglobin: 11.8 g/dL — ABNORMAL LOW (ref 12.0–15.0)
Immature Granulocytes: 1 %
Lymphocytes Relative: 33 %
Lymphs Abs: 2.4 10*3/uL (ref 0.7–4.0)
MCH: 32.5 pg (ref 26.0–34.0)
MCHC: 31.1 g/dL (ref 30.0–36.0)
MCV: 104.7 fL — ABNORMAL HIGH (ref 80.0–100.0)
Monocytes Absolute: 0.3 10*3/uL (ref 0.1–1.0)
Monocytes Relative: 4 %
Neutro Abs: 4.2 10*3/uL (ref 1.7–7.7)
Neutrophils Relative %: 60 %
Platelets: 219 10*3/uL (ref 150–400)
RBC: 3.63 MIL/uL — ABNORMAL LOW (ref 3.87–5.11)
RDW: 15.6 % — ABNORMAL HIGH (ref 11.5–15.5)
WBC: 7.1 10*3/uL (ref 4.0–10.5)
nRBC: 0 % (ref 0.0–0.2)

## 2023-02-16 LAB — RESP PANEL BY RT-PCR (RSV, FLU A&B, COVID)  RVPGX2
Influenza A by PCR: NEGATIVE
Influenza B by PCR: NEGATIVE
Resp Syncytial Virus by PCR: NEGATIVE
SARS Coronavirus 2 by RT PCR: NEGATIVE

## 2023-02-16 LAB — CBG MONITORING, ED
Glucose-Capillary: 86 mg/dL (ref 70–99)
Glucose-Capillary: 91 mg/dL (ref 70–99)

## 2023-02-16 LAB — OSMOLALITY, URINE: Osmolality, Ur: 566 mosm/kg (ref 300–900)

## 2023-02-16 LAB — SODIUM, URINE, RANDOM: Sodium, Ur: 181 mmol/L

## 2023-02-16 LAB — SODIUM: Sodium: 150 mmol/L — ABNORMAL HIGH (ref 135–145)

## 2023-02-16 LAB — MAGNESIUM
Magnesium: 2.1 mg/dL (ref 1.7–2.4)
Magnesium: 2.1 mg/dL (ref 1.7–2.4)

## 2023-02-16 MED ORDER — LEVETIRACETAM IN NACL 1000 MG/100ML IV SOLN
1000.0000 mg | Freq: Once | INTRAVENOUS | Status: AC
  Administered 2023-02-16: 1000 mg via INTRAVENOUS
  Filled 2023-02-16: qty 100

## 2023-02-16 MED ORDER — LEVETIRACETAM IN NACL 1000 MG/100ML IV SOLN
1000.0000 mg | Freq: Two times a day (BID) | INTRAVENOUS | Status: DC
  Administered 2023-02-16 – 2023-02-21 (×10): 1000 mg via INTRAVENOUS
  Filled 2023-02-16 (×10): qty 100

## 2023-02-16 MED ORDER — CEPHALEXIN 500 MG PO CAPS: 500.0000 mg | ORAL_CAPSULE | Freq: Two times a day (BID) | ORAL | 0 refills | Status: DC

## 2023-02-16 MED ORDER — ONDANSETRON HCL 4 MG/2ML IJ SOLN
4.0000 mg | Freq: Four times a day (QID) | INTRAMUSCULAR | Status: DC | PRN
Start: 2023-02-16 — End: 2023-02-21

## 2023-02-16 MED ORDER — ENOXAPARIN SODIUM 40 MG/0.4ML IJ SOSY
40.0000 mg | PREFILLED_SYRINGE | INTRAMUSCULAR | Status: DC
Start: 2023-02-16 — End: 2023-02-21
  Administered 2023-02-16 – 2023-02-20 (×5): 40 mg via SUBCUTANEOUS
  Filled 2023-02-16 (×5): qty 0.4

## 2023-02-16 MED ORDER — SODIUM CHLORIDE 0.9 % IV SOLN
1.0000 g | INTRAVENOUS | Status: AC
  Administered 2023-02-16: 1 g via INTRAVENOUS
  Filled 2023-02-16: qty 10

## 2023-02-16 MED ORDER — SODIUM CHLORIDE 0.9 % IV SOLN
75.0000 mL/h | INTRAVENOUS | Status: DC
  Administered 2023-02-16: 75 mL/h via INTRAVENOUS

## 2023-02-16 MED ORDER — SODIUM CHLORIDE 0.9 % IV BOLUS
1000.0000 mL | Freq: Once | INTRAVENOUS | Status: AC
  Administered 2023-02-16: 1000 mL via INTRAVENOUS

## 2023-02-16 MED ORDER — LEVETIRACETAM IN NACL 1000 MG/100ML IV SOLN
1000.0000 mg | INTRAVENOUS | Status: AC
  Administered 2023-02-16: 1000 mg via INTRAVENOUS
  Filled 2023-02-16: qty 100

## 2023-02-16 MED ORDER — ORAL CARE MOUTH RINSE
15.0000 mL | OROMUCOSAL | Status: DC
  Administered 2023-02-16 – 2023-02-21 (×51): 15 mL via OROMUCOSAL
  Filled 2023-02-16 (×20): qty 15

## 2023-02-16 MED ORDER — ONDANSETRON HCL 4 MG PO TABS: 4.0000 mg | ORAL_TABLET | Freq: Four times a day (QID) | ORAL | Status: DC | PRN

## 2023-02-16 MED ORDER — DIAZEPAM 5 MG/ML IJ SOLN
5.0000 mg | Freq: Once | INTRAMUSCULAR | Status: AC
  Administered 2023-02-16: 5 mg via INTRAVENOUS
  Filled 2023-02-16: qty 2

## 2023-02-16 MED ORDER — ORAL CARE MOUTH RINSE: 15.0000 mL | OROMUCOSAL | Status: DC | PRN

## 2023-02-16 MED ORDER — SODIUM CHLORIDE 0.9 % IV SOLN
1.0000 g | INTRAVENOUS | Status: DC
  Administered 2023-02-17 – 2023-02-20 (×4): 1 g via INTRAVENOUS
  Filled 2023-02-16 (×4): qty 10

## 2023-02-16 MED ORDER — LORAZEPAM 2 MG/ML IJ SOLN: 0.5000 mg | INTRAMUSCULAR | Status: DC | PRN

## 2023-02-16 MED ORDER — CEPHALEXIN 500 MG PO CAPS: 500.0000 mg | ORAL_CAPSULE | Freq: Once | ORAL | Status: DC

## 2023-02-16 NOTE — Assessment & Plan Note (Addendum)
Na 151 on presentation  Suspect secondary to seizure event-likely transient  Fairly euvolemic Will place on gentle IVF hydration Check urine and serum studies Monitor

## 2023-02-16 NOTE — ED Provider Notes (Signed)
.  Ultrasound ED Peripheral IV (Provider)  Date/Time: 02/16/2023 8:02 PM  Performed by: Corena Herter, MD Authorized by: Corena Herter, MD   Procedure details:    Indications: multiple failed IV attempts     Skin Prep: chlorhexidine gluconate     Location:  Right forearm   Angiocath:  18 G   Bedside Ultrasound Guided: Yes     Images: not archived     Patient tolerated procedure without complications: Yes     Dressing applied: Yes       Corena Herter, MD 02/16/23 2002

## 2023-02-16 NOTE — H&P (Addendum)
History and Physical    Patient: Lori [facility]en [facility] ZOX:[id number] DOB: [birth year] DOA: 02/16/2023 DOS: the patient was seen and examined on 02/16/2023 PCP: Hillery Aldo, MD  Patient coming from: Home  Chief Complaint:  Chief Complaint  Patient presents with   Seizures   HPI: Lori Petersen [facility] is a 68 y.o. female with medical history significant of seizure, HLD, pre-DM, asthma, dCHF, hypothyroidism, GERD, Down syndrome, dementia presenting with recurrent seizure, encephalopathy, UTI.  Limited history in setting of encephalopathy.Patient noted to be resident of [facility].  Baseline history of seizure disorder as well as Down syndrome and dementia.  Had seizure event around a feeding this morning.  Also with seizure last night.  Had return to baseline after episodes.  Had third seizure around 930 this morning which lasted approximately 25 seconds.  EMS subsequently called with patient with persisting lethargy.  No reported fevers or chills.  No reported nausea or vomiting.  No reported head traumas. Noted admission 11/2022 for similar issues.  Presented to the ER afebrile, hemodynamically stable.  Supplemental oxygen place for respiratory support.White count 7.1, hemoglobin 11.8, platelets 219, urinalysis indicative of infection, creatinine 1.03, sodium 151. CXR and CT head grossly stable.  Review of Systems: As mentioned in the history of present illness. All other systems reviewed and are negative. Past Medical History:  Diagnosis Date   AKI (acute kidney injury) (HCC)    Arthritis    lower spine   Asthma    Barrett's esophagus    Bradycardia    Constipation    DDD (degenerative disc disease), cervical    Dementia (HCC)    Diastolic congestive heart failure (HCC)    "only has problems with bad colds"   Down's syndrome    Dysrhythmia    long QT disorder,   Fatty liver    Full dentures    does not wear   GERD (gastroesophageal reflux disease)    barrett's   Glaucoma    History of  kidney stones    HLD (hyperlipidemia)    Hypothyroidism    Mixed dementia (HCC)    Nausea & vomiting 03/25/2021   Onychomycosis    OSA (obstructive sleep apnea)    Pneumonia 05/17/2021   Pre-diabetes    Pressure ulcers of skin of multiple topographic sites 2023   Respiratory failure (HCC)    Seizures (HCC)    Sepsis (HCC) 05/17/2021   Speech articulation disorder    Spinal stenosis of lumbar region    Thrombocytopenia (HCC)    UTI (urinary tract infection) 05/17/2021   Vitamin D deficiency    Past Surgical History:  Procedure Laterality Date   BREAST BIOPSY Left 04/05/2016   neg. cylinder clip   BREAST BIOPSY Left 12/06/2016   ribbon clip. path pending   COLONOSCOPY WITH PROPOFOL N/A 04/24/2016   Procedure: COLONOSCOPY WITH PROPOFOL;  Surgeon: Midge Minium, MD;  Location: [facility];  Service: Endoscopy;  Laterality: N/A;  Leave patient at 10:25 due to transportation   CYSTOSCOPY WITH RETROGRADE PYELOGRAM, URETEROSCOPY AND STENT PLACEMENT Left 06/25/2021   Procedure: CYSTOSCOPY WITH RETROGRADE PYELOGRAM, URETEROSCOPY AND STENT PLACEMENT;  Surgeon: Riki Altes, MD;  Location: [facility];  Service: Urology;  Laterality: Left;   CYSTOSCOPY WITH STENT PLACEMENT Left 03/24/2021   Procedure: CYSTOSCOPY WITH STENT PLACEMENT;  Surgeon: Sondra Come, MD;  Location: [facility];  Service: Urology;  Laterality: Left;   CYSTOSCOPY/URETEROSCOPY/HOLMIUM LASER/STENT PLACEMENT Left 06/24/2021   Procedure: CYSTOSCOPY/URETEROSCOPY/HOLMIUM LASER/STENT PLACEMENT;  Surgeon: Legrand Rams  C, MD;  Location: [facility];  Service: Urology;  Laterality: Left;   CYSTOSCOPY/URETEROSCOPY/HOLMIUM LASER/STENT PLACEMENT Left 07/22/2021   Procedure: CYSTOSCOPY/URETEROSCOPY/HOLMIUM LASER/STENT PLACEMENT;  Surgeon: Sondra Come, MD;  Location: [facility];  Service: Urology;  Laterality: Left;   ESOPHAGOGASTRODUODENOSCOPY (EGD) WITH PROPOFOL N/A 04/24/2016   Procedure: ESOPHAGOGASTRODUODENOSCOPY (EGD) WITH  PROPOFOL;  Surgeon: Midge Minium, MD;  Location: [facility];  Service: Endoscopy;  Laterality: N/A;   HERNIA REPAIR     Social History:  reports that she has never smoked. She has never used smokeless tobacco. She reports that she does not currently use drugs. She reports that she does not drink alcohol.  No Known Allergies  No family history on file.  Prior to Admission medications   Medication Sig Start Date End Date Taking? Authorizing Provider  cephALEXin (KEFLEX) 500 MG capsule Take 1 capsule (500 mg total) by mouth 2 (two) times daily. 02/16/23  Yes Sharman Cheek, MD  albuterol (PROVENTIL) (2.5 MG/3ML) 0.083% nebulizer solution Take 2.5 mg by nebulization every 4 (four) hours as needed for wheezing or shortness of breath.    [provider]  aspirin EC 81 MG tablet Take 81 mg by mouth daily.    [provider]  budesonide (PULMICORT) 0.25 MG/2ML nebulizer solution Take 0.25 mg by nebulization 2 (two) times daily.    [provider]  cholecalciferol (VITAMIN D3) 25 MCG (1000 UT) tablet Take 2,000 Units by mouth daily.    [provider]  collagenase (SANTYL) 250 UNIT/GM ointment Apply 1 Application topically daily. Sacral pressure wound    [provider]  COMBIGAN 0.2-0.5 % ophthalmic solution Place 1 drop into both eyes in the morning and at bedtime.    [provider]  DESITIN 13 % CREA Apply 1 application. topically in the morning and at bedtime. Patient not taking: Reported on 07/13/2021 03/29/21   [provider]  DIMETHICONE-ZINC OXIDE-VIT A-D EX Apply 1 Tube topically 3 (three) times daily. Apply to Gluteal areas during toileting.    [provider]  formoterol (PERFOROMIST) 20 MCG/2ML nebulizer solution Take 20 mcg by nebulization 2 (two) times daily.    [provider]  levETIRAcetam (KEPPRA) 250 MG tablet Take 250 mg by mouth 2 (two) times daily. (Take with 500mg  tablet to equal 750mg   total)    [provider]  levETIRAcetam (KEPPRA) 500 MG tablet Take 500 mg by mouth 2 (two) times daily. (Take with 250mg  tablet to equal 750mg  total)    [provider]  levothyroxine (SYNTHROID) 100 MCG tablet Take 100 mcg by mouth daily before breakfast.    [provider]  omeprazole (PRILOSEC) 20 MG capsule Take 20 mg by mouth daily.    [provider]  pravastatin (PRAVACHOL) 40 MG tablet Take 40 mg by mouth at bedtime.    [provider]  prenatal vitamin w/FE, FA (PRENATAL 1 + 1) 27-1 MG TABS tablet Take 1 tablet by mouth daily.    [provider]  senna (SENOKOT) 8.6 MG TABS tablet Take 2 tablets by mouth in the morning and at bedtime. Patient not taking: Reported on 12/02/2022    [provider]  silver sulfADIAZINE (SILVADENE) 1 % cream Apply 1 Application topically daily. Apply to left dorsal wrist Patient not taking: Reported on 12/02/2022    [provider]  sodium hypochlorite (DAKIN'S 1/4 STRENGTH) 0.125 % SOLN Apply 1 Application topically daily. Monday-Friday 09/21/21   [provider]  vitamin B-12 (CYANOCOBALAMIN) 1000 MCG  tablet Take 1,000 mcg by mouth daily.    [provider]  Zinc Oxide 13 % CREA Apply 1 application. topically 3 (three) times daily as needed (skin protection). Apply to gluteal area 3X daily with toileting - to protect skin Patient not taking: Reported on 07/13/2021    [provider]    Physical Exam: Vitals:   02/16/23 1615 02/16/23 1624 02/16/23 1630 02/16/23 1645  BP: 115/61 103/63 (!) 108/57 103/78  Pulse:  72 71 79  Resp: (!) 9 12 11 12   Temp:      TempSrc:      SpO2:  100% 100% 100%  Weight:      Height:       Physical Exam Constitutional:      Appearance: She is obese.     Comments: Lethargic at the bedside   HENT:     Head: Normocephalic and atraumatic.     Nose: Nose normal.     Mouth/Throat:     Mouth: Mucous membranes are moist.   Eyes:     Pupils: Pupils are equal, round, and reactive to light.  Cardiovascular:     Rate and Rhythm: Normal rate and regular rhythm.  Pulmonary:     Effort: Pulmonary effort is normal.  Abdominal:     General: Bowel sounds are normal.  Musculoskeletal:     Comments: + generalized weakness    Skin:    General: Skin is warm.  Neurological:     Comments: + lethargy at the bedside      Data Reviewed:  There are no new results to review at this time.  CT Head Wo Contrast CLINICAL DATA:  Mental status change, unknown cause  EXAM: CT HEAD WITHOUT CONTRAST  TECHNIQUE: Contiguous axial images were obtained from the base of the skull through the vertex without intravenous contrast.  RADIATION DOSE REDUCTION: This exam was performed according to the departmental dose-optimization program which includes automated exposure control, adjustment of the mA and/or kV according to patient size and/or use of iterative reconstruction technique.  COMPARISON:  12/01/2022  FINDINGS: Brain: No evidence of acute infarction, hemorrhage, hydrocephalus, extra-axial collection or mass lesion/mass effect. Patchy low-density changes within the periventricular and subcortical white matter most compatible with chronic microvascular ischemic change. Moderate diffuse cerebral volume loss.  Vascular: Atherosclerotic calcifications involving the large vessels of the skull base. No unexpected hyperdense vessel.  Skull: Normal. Negative for fracture or focal lesion.  Sinuses/Orbits: No acute finding.  Other: None.  IMPRESSION: 1. No acute intracranial findings. 2. Chronic microvascular ischemic change and cerebral volume loss.  Electronically Signed   By: Duanne Guess D.O.   On: 02/16/2023 15:30 DG Chest Portable 1 View CLINICAL DATA:  Seizures and altered mental status.  EXAM: PORTABLE CHEST 1 VIEW  COMPARISON:  Chest radiograph dated December 02, 2022.  FINDINGS: Low lung  volumes. The heart size and mediastinal contours are unchanged. No focal consolidation, sizeable pleural effusion, or pneumothorax. Severe degenerative changes of the bilateral glenohumeral joints. No acute osseous abnormality.  IMPRESSION: Low lung volumes.  No acute cardiopulmonary findings.  Electronically Signed   By: Hart Robinsons M.D.   On: 02/16/2023 11:34  Lab Results  Component Value Date   WBC 7.1 02/16/2023   HGB 11.8 (L) 02/16/2023   HCT 38.0 02/16/2023   MCV 104.7 (H) 02/16/2023   PLT 219 02/16/2023   Last metabolic panel Lab Results  Component Value Date   GLUCOSE 109 (H) 02/16/2023   NA 151 (  H) 02/16/2023   K 3.9 02/16/2023   CL 112 (H) 02/16/2023   CO2 26 02/16/2023   BUN 18 02/16/2023   CREATININE 1.03 (H) 02/16/2023   GFRNONAA 60 (L) 02/16/2023   CALCIUM 8.9 02/16/2023   PHOS 2.3 (L) 06/28/2021   PROT 7.6 02/16/2023   ALBUMIN 3.4 (L) 02/16/2023   BILITOT 0.4 02/16/2023   ALKPHOS 43 02/16/2023   AST 26 02/16/2023   ALT 25 02/16/2023   ANIONGAP 13 02/16/2023    Assessment and Plan: * Breakthrough seizure (HCC) Recurrent seizure events in setting of baseline seizure disorder noted at living facility as well as ER  S/p 1000mg  IV keppra  Pending repeat 1000mg  IV keppra per neurology recommendations Keppra level pending  CT head WNL  Noted excessive sedation w/ valium (5mg ) Will place on IV prn ativan for now  Follow up further neurology recommendations     UTI (urinary tract infection) Urinalysis indicative of infection  Will place on IV rocephin for infectious coverage in setting of breakthrough seizure  Urine culture  Follow   Acute encephalopathy + generalized lethargy in setting of recurrent seizure S/p IV valium  Na 151  CT head WNL  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  Monitor   Hypernatremia Na 151 on presentation  Suspect secondary to seizure event-likely transient  Fairly  euvolemic Will place on gentle IVF hydration Check urine and serum studies Monitor  Chronic diastolic CHF (congestive heart failure) (HCC) 2D ECHO 03/2021 w/ EF 55-60%  Appears euvolemic  Monitor volume status    Asthma, mild intermittent Stable from a resp standpoint Cont home inhaler    Hypothyroid Cont synthroid       Advance Care Planning:   Code Status: Full Code   Consults: Neurology   Family Communication: No family at the bedside   Severity of Illness: The appropriate patient status for this patient is INPATIENT. Inpatient status is judged to be reasonable and necessary in order to provide the required intensity of service to ensure the patient's safety. The patient's presenting symptoms, physical exam findings, and initial radiographic and laboratory data in the context of their chronic comorbidities is felt to place them at high risk for further clinical deterioration. Furthermore, it is not anticipated that the patient will be medically stable for discharge from the hospital within 2 midnights of admission.   * I certify that at the point of admission it is my clinical judgment that the patient will require inpatient hospital care spanning beyond 2 midnights from the point of admission due to high intensity of service, high risk for further deterioration and high frequency of surveillance required.*  Author: Floydene Flock, MD 02/16/2023 5:00 PM  For on call review [email or web address].

## 2023-02-16 NOTE — Assessment & Plan Note (Signed)
2D ECHO 03/2021 w/ EF 55-60%  Appears euvolemic  Monitor volume status

## 2023-02-16 NOTE — ED Notes (Signed)
Patient is more alert, opens eyes to voice. Patient is not verbal, even on command. Dr. Scotty Court aware. Patient does not appear to be at baseline as given on report.

## 2023-02-16 NOTE — ED Notes (Signed)
Dr. Uvaldo Rising at bedside.

## 2023-02-16 NOTE — ED Notes (Signed)
Per Dr. Charmian Muff report, patient had another seizure. Patient placed on 5L O2 via [place] and is 100% at this time. Patient has her eyes 1/2 way open and will react to voice by turning her head, but does not make eye contact or verbalize at this time.

## 2023-02-16 NOTE — Consult Note (Signed)
NEUROLOGY CONSULT NOTE   Date of service: February 16, 2023 Patient Name: Lori Petersen MRN:  [id number] DOB:  1956/01/15 Chief Complaint: "Seizures" Requesting Provider: Floydene Flock, MD  History of Present Illness  Lori Petersen [facility] is a 68 y.o. female with hx of Down syndrome, dementia, as well as seizures who presents with multiple seizures today.  She had three seizures earlier, and was improving in the emergency department when she had a fourth and remained encephalopathic following this.  She is now starting to improve, but not back to baseline and is therefore being admitted for observation.  She is unfortunately unable to give any history.  Past History   Past Medical History:  Diagnosis Date   AKI (acute kidney injury) (HCC)    Arthritis    lower spine   Asthma    Barrett's esophagus    Bradycardia    Constipation    DDD (degenerative disc disease), cervical    Dementia (HCC)    Diastolic congestive heart failure (HCC)    "only has problems with bad colds"   Down's syndrome    Dysrhythmia    long QT disorder,   Fatty liver    Full dentures    does not wear   GERD (gastroesophageal reflux disease)    barrett's   Glaucoma    History of kidney stones    HLD (hyperlipidemia)    Hypothyroidism    Mixed dementia (HCC)    Nausea & vomiting 03/25/2021   Onychomycosis    OSA (obstructive sleep apnea)    Pneumonia 05/17/2021   Pre-diabetes    Pressure ulcers of skin of multiple topographic sites 2023   Respiratory failure (HCC)    Seizures (HCC)    Sepsis (HCC) 05/17/2021   Speech articulation disorder    Spinal stenosis of lumbar region    Thrombocytopenia (HCC)    UTI (urinary tract infection) 05/17/2021   Vitamin D deficiency     Past Surgical History:  Procedure Laterality Date   BREAST BIOPSY Left 04/05/2016   neg. cylinder clip   BREAST BIOPSY Left 12/06/2016   ribbon clip. path pending   COLONOSCOPY WITH PROPOFOL N/A 04/24/2016   Procedure:  COLONOSCOPY WITH PROPOFOL;  Surgeon: Midge Minium, MD;  Location: [facility];  Service: Endoscopy;  Laterality: N/A;  Leave patient at 10:25 due to transportation   CYSTOSCOPY WITH RETROGRADE PYELOGRAM, URETEROSCOPY AND STENT PLACEMENT Left 06/25/2021   Procedure: CYSTOSCOPY WITH RETROGRADE PYELOGRAM, URETEROSCOPY AND STENT PLACEMENT;  Surgeon: Riki Altes, MD;  Location: [facility];  Service: Urology;  Laterality: Left;   CYSTOSCOPY WITH STENT PLACEMENT Left 03/24/2021   Procedure: CYSTOSCOPY WITH STENT PLACEMENT;  Surgeon: Sondra Come, MD;  Location: [facility];  Service: Urology;  Laterality: Left;   CYSTOSCOPY/URETEROSCOPY/HOLMIUM LASER/STENT PLACEMENT Left 06/24/2021   Procedure: CYSTOSCOPY/URETEROSCOPY/HOLMIUM LASER/STENT PLACEMENT;  Surgeon: Sondra Come, MD;  Location: [facility];  Service: Urology;  Laterality: Left;   CYSTOSCOPY/URETEROSCOPY/HOLMIUM LASER/STENT PLACEMENT Left 07/22/2021   Procedure: CYSTOSCOPY/URETEROSCOPY/HOLMIUM LASER/STENT PLACEMENT;  Surgeon: Sondra Come, MD;  Location: [facility];  Service: Urology;  Laterality: Left;   ESOPHAGOGASTRODUODENOSCOPY (EGD) WITH PROPOFOL N/A 04/24/2016   Procedure: ESOPHAGOGASTRODUODENOSCOPY (EGD) WITH PROPOFOL;  Surgeon: Midge Minium, MD;  Location: [facility];  Service: Endoscopy;  Laterality: N/A;   HERNIA REPAIR      Family History: No family history on file.  Social History  reports that she has never smoked. She has never used smokeless tobacco. She reports that she  does not currently use drugs. She reports that she does not drink alcohol.  No Known Allergies  Medications   Current Facility-Administered Medications:    0.9 %  sodium chloride infusion, 75 mL/hr, Intravenous, Continuous, Floydene Flock, MD, Last Rate: 75 mL/hr at 02/16/23 1639, 75 mL/hr at 02/16/23 1639   [START ON 02/17/2023] cefTRIAXone (ROCEPHIN) 1 g in sodium chloride 0.9 % 100 mL IVPB, 1 g, Intravenous, Q24H, Floydene Flock, MD    enoxaparin (LOVENOX) injection 40 mg, 40 mg, Subcutaneous, Q24H, Floydene Flock, MD   LORazepam (ATIVAN) injection 0.5 mg, 0.5 mg, Intravenous, Q2H PRN, Floydene Flock, MD   ondansetron [facility]) tablet 4 mg, 4 mg, Oral, Q6H PRN **OR** ondansetron (ZOFRAN) injection 4 mg, 4 mg, Intravenous, Q6H PRN, Floydene Flock, MD   Oral care mouth rinse, 15 mL, Mouth Rinse, Q2H, Floydene Flock, MD   Oral care mouth rinse, 15 mL, Mouth Rinse, PRN, Floydene Flock, MD  Current Outpatient Medications:    cephALEXin (KEFLEX) 500 MG capsule, Take 1 capsule (500 mg total) by mouth 2 (two) times daily., Disp: 14 capsule, Rfl: 0   albuterol (PROVENTIL) (2.5 MG/3ML) 0.083% nebulizer solution, Take 2.5 mg by nebulization every 4 (four) hours as needed for wheezing or shortness of breath., Disp: , Rfl:    aspirin EC 81 MG tablet, Take 81 mg by mouth daily., Disp: , Rfl:    budesonide (PULMICORT) 0.25 MG/2ML nebulizer solution, Take 0.25 mg by nebulization 2 (two) times daily., Disp: , Rfl:    cholecalciferol (VITAMIN D3) 25 MCG (1000 UT) tablet, Take 2,000 Units by mouth daily., Disp: , Rfl:    collagenase (SANTYL) 250 UNIT/GM ointment, Apply 1 Application topically daily. Sacral pressure wound, Disp: , Rfl:    COMBIGAN 0.2-0.5 % ophthalmic solution, Place 1 drop into both eyes in the morning and at bedtime., Disp: , Rfl:    DESITIN 13 % CREA, Apply 1 application. topically in the morning and at bedtime. (Patient not taking: Reported on 07/13/2021), Disp: , Rfl:    DIMETHICONE-ZINC OXIDE-VIT A-D EX, Apply 1 Tube topically 3 (three) times daily. Apply to Gluteal areas during toileting., Disp: , Rfl:    formoterol (PERFOROMIST) 20 MCG/2ML nebulizer solution, Take 20 mcg by nebulization 2 (two) times daily., Disp: , Rfl:    levETIRAcetam (KEPPRA) 250 MG tablet, Take 250 mg by mouth 2 (two) times daily. (Take with 500mg  tablet to equal 750mg  total), Disp: , Rfl:    levETIRAcetam (KEPPRA) 500 MG tablet, Take 500 mg by  mouth 2 (two) times daily. (Take with 250mg  tablet to equal 750mg  total), Disp: , Rfl:    levothyroxine (SYNTHROID) 100 MCG tablet, Take 100 mcg by mouth daily before breakfast., Disp: , Rfl:    omeprazole (PRILOSEC) 20 MG capsule, Take 20 mg by mouth daily., Disp: , Rfl:    pravastatin (PRAVACHOL) 40 MG tablet, Take 40 mg by mouth at bedtime., Disp: , Rfl:    prenatal vitamin w/FE, FA (PRENATAL 1 + 1) 27-1 MG TABS tablet, Take 1 tablet by mouth daily., Disp: , Rfl:    senna (SENOKOT) 8.6 MG TABS tablet, Take 2 tablets by mouth in the morning and at bedtime. (Patient not taking: Reported on 12/02/2022), Disp: , Rfl:    silver sulfADIAZINE (SILVADENE) 1 % cream, Apply 1 Application topically daily. Apply to left dorsal wrist (Patient not taking: Reported on 12/02/2022), Disp: , Rfl:    sodium hypochlorite (DAKIN'S 1/4 STRENGTH) 0.125 % SOLN, Apply  1 Application topically daily. Monday-Friday, Disp: , Rfl:    vitamin B-12 (CYANOCOBALAMIN) 1000 MCG tablet, Take 1,000 mcg by mouth daily., Disp: , Rfl:    Zinc Oxide 13 % CREA, Apply 1 application. topically 3 (three) times daily as needed (skin protection). Apply to gluteal area 3X daily with toileting - to protect skin (Patient not taking: Reported on 07/13/2021), Disp: , Rfl:   Vitals   Vitals:   2023-02-19 1700 02/19/23 1715 02-19-23 1730 2023/02/19 1800  BP: 101/74 113/63 (!) 104/56 (!) 107/57  Pulse: 81 80 (!) 56 65  Resp: 16 (!) 9  (!) 8  Temp:      TempSrc:      SpO2: 100% 100% 100% 100%  Weight:      Height:        Body mass index is 28.32 kg/m.  Physical Exam   Constitutional: Appears well-developed and well-nourished.  Neurologic Examination    Neuro: Mental Status: Patient is lethargic but able to be awoken.  When she is awake, she fixates and tracks me around the room, but does not follow commands or speak. Cranial Nerves: II: She blinks to threat for the right, ?  From the left.  Right pupil is 3 mm and reactive, left pupil  has a large cataract and the pupil is nonreactive(this is noted in a previous neurology note from 2016) III,IV, VI: EOMI without ptosis or diploplia.  V: Facial sensation is symmetric to temperature VII: Facial movement is symmetric.  VIII: hearing is intact to voice X: Uvula elevates symmetrically XII: tongue is midline without atrophy or fasciculations.  Motor: She withdraws to noxious stimulation in all four extremities Sensory: As above    Labs/Imaging/Neurodiagnostic studies   CBC:  Recent Labs  Lab 02/19/2023 1129  WBC 7.1  NEUTROABS 4.2  HGB 11.8*  HCT 38.0  MCV 104.7*  PLT 219   Basic Metabolic Panel:  Lab Results  Component Value Date   NA 151 (H) 02-19-2023   K 3.9 02-19-23   CO2 26 02-19-23   GLUCOSE 109 (H) 02-19-2023   BUN 18 02/19/2023   CREATININE 1.03 (H) 2023-02-19   CALCIUM 8.9 19-Feb-2023   GFRNONAA 60 (L) 02/19/2023   GFRAA >60 12/04/2018   Lipid Panel:  Lab Results  Component Value Date   LDLCALC 98 10/28/2011   HgbA1c:  Lab Results  Component Value Date   HGBA1C 5.9 (H) 05/17/2021   Urine Drug Screen: No results found for: "LABOPIA", "COCAINSCRNUR", "LABBENZ", "AMPHETMU", "THCU", "LABBARB"  Alcohol Level No results found for: "ETH" INR  Lab Results  Component Value Date   INR 1.4 (H) 06/25/2021   APTT  Lab Results  Component Value Date   APTT 47 (H) 06/25/2021   AED levels:  Lab Results  Component Value Date   LEVETIRACETA 31.5 12/03/2022   CT head is unremarkable  ASSESSMENT   Nike Southers [facility] is a 68 y.o. female with a history of Down syndrome, dementia, and seizures who presents with multiple breakthrough seizures possibly in the setting of urinary tract infection.  I would favor increasing her Keppra slightly she is currently on 750 twice daily and I will increase this to 1 g twice daily.  As long as she improves, I do not think any further neurological workup will be needed, but if she continues to have spells or if she  remains significantly far from her baseline, then may need transfer for EEG over the weekend.  RECOMMENDATIONS  Increase Keppra to 1  g twice daily Check magnesium Neurology will continue to follow ______________________________________________________________________    Signed, Ritta Slot, MD [facility]

## 2023-02-16 NOTE — ED Notes (Signed)
Patient placed on non-breather.

## 2023-02-16 NOTE — Progress Notes (Signed)
       CROSS COVER NOTE  NAME: [facility] MRN: [id number] DOB : 18-May-1955 ATTENDING PHYSICIAN: Floydene Flock, MD    Date of Service   02/16/2023   HPI/Events of Note   Message sent to admitting physician Dr Alvester Morin via secure chat from Thurnell Garbe RN Patient is hypotensive 91/60, pulse is 50's to 60's, RR 14, but still can be as low as 8-9 when not stimulated. Can we change to step-down or the unit.  I was added to conversation by admitting MD   Interventions   Assessment/Plan: Upon review of HPI labs etc. Patient admitted with breakthrough seizure of with neuro managing (loaded on Keppra). POA AKI, hypernatremia (151 on admission 11:29 AM), no further sodium levels resulted) encephalopathy, presumed UTI (no nitrates on UA, culture pending on rocephin) chronic heart failure who appeared euvolemic by admitting physician  assessment findings documented in chart and AKI (creat 1.03) CT head was negative Chest xray significant for atelectasis only Afebrile, no leukocytosis on CBC and Hgb at baseline    02/16/2023    7:45 PM 02/16/2023    7:33 PM 02/16/2023    7:30 PM  Vitals with BMI  Systolic 107 91 89  Diastolic 54 60 54  Pulse 82 56 55   Patient was on NRB with oxygen saturation 100%, purpose of NRB unknown, now on room air still 100% saturation Opens eyes touch but easily falls back to sleep SB on monitor Chem panel without improvement in sodium Vbg good   Purewick was planned for output monitoring but Stage IV sacral decub reported by RN - foley ordered Stage IV decub - air mattress ordered.  hypernatremia - started D5 at 86 - awaiting updated labs as of 0615     Donnie Mesa NP [facility] Cross Cover 7pm-7am - check amion for availability Pager [phone]

## 2023-02-16 NOTE — Assessment & Plan Note (Addendum)
Recurrent seizure events in setting of baseline seizure disorder noted at living facility as well as ER  S/p 1000mg  IV keppra  Pending repeat 1000mg  IV keppra per neurology recommendations Keppra level pending  CT head WNL  Noted excessive sedation w/ valium (5mg ) Will place on IV prn ativan for now  Follow up further neurology recommendations

## 2023-02-16 NOTE — Assessment & Plan Note (Addendum)
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

## 2023-02-16 NOTE — ED Notes (Signed)
Pulse ox is on ear and occasionally does not pick up. Monitor reads apnea and RR at 0, leads were readjusted and upon visual assessment, patient was breathing and would take deep breath upon command. Patient does open eyes when name is called, but goes back to sleep immediately.

## 2023-02-16 NOTE — Assessment & Plan Note (Signed)
 Cont synthroid

## 2023-02-16 NOTE — ED Notes (Signed)
Patient had a brady episode, pulses were weak and thready. Patient grimaced with chest compressions. Dr. Arnoldo Morale and Dr. Scotty Court at bedside. Patient placed on pads.

## 2023-02-16 NOTE — Assessment & Plan Note (Signed)
Stable from a resp standpoint  Cont home inhaler

## 2023-02-16 NOTE — ED Notes (Signed)
CT tech stated that patient tolerated exam well.

## 2023-02-16 NOTE — ED Notes (Signed)
Patient is sleeping with eyes closed at this time. Patient arrived able to follow a few commands, but not at baseline. Patient gradually became more drowsy and is now asleep. Patient does react to stimuli with opening her eyes.

## 2023-02-16 NOTE — Assessment & Plan Note (Signed)
Urinalysis indicative of infection  Will place on IV rocephin for infectious coverage in setting of breakthrough seizure  Urine culture  Follow

## 2023-02-16 NOTE — Discharge Instructions (Signed)
Your tests today show a urinary tract infection.  The other tests were okay. Please start antibiotics and continue your other medications as usual.  We gave your morning dose of Keppra in the ED today.

## 2023-02-16 NOTE — ED Notes (Signed)
Pt switched from non rebreather to  at 2L, vitals are stable. No signs of distress. Will continue to monitor at bedside for further changes

## 2023-02-16 NOTE — ED Triage Notes (Addendum)
Per EMS report, patient lives at [facility] and has a known history of seizures. Per staff report to EMT, patient had a seizure last night and one this morning at8:50  and went to baseline and was able to feed herself and get ready for an appointment. Patient had a 3rd seizure at 09: 30 in a wheelchair and one in the ambulance at 10:05 which lasted approximately 25 seconds. Patient is alert, but not at baseline per EMT report and is able to follow some commands.   BG 106 B/P 108/69 Pulse 90 Pulse ox 2L

## 2023-02-16 NOTE — ED Provider Notes (Addendum)
[facility] Provider Note    Event Date/Time   First MD Initiated Contact with Patient 02/16/23 1045     (approximate)   History   Chief Complaint: Seizures   HPI  Lori Petersen is a 68 y.o. female with a history of Down syndrome, dementia, seizure disorder who was brought to the ED due to multiple seizures since last night.  Reportedly had 3 brief seizures this morning between 8:50 AM and 10:05 AM.  Reviewing her MAR, last dose of Keppra was last night.  No falls or trauma, no reported fever or other acute illness.         Physical Exam   Triage Vital Signs: ED Triage Vitals  Encounter Vitals Group     BP 02/16/23 1047 119/66     Systolic BP Percentile --      Diastolic BP Percentile --      Pulse Rate 02/16/23 1047 (!) 104     Resp --      Temp 02/16/23 1047 97.7 F (36.5 C)     Temp Source 02/16/23 1047 Oral     SpO2 02/16/23 1047 95 %     Weight 02/16/23 1049 145 lb (65.8 kg)     Height 02/16/23 1049 5' (1.524 m)     Head Circumference --      Peak Flow --      Pain Score --      Pain Loc --      Pain Education --      Exclude from Growth Chart --     Most recent vital signs: Vitals:   02/16/23 1515 02/16/23 1534  BP: (!) 111/54 114/76  Pulse: 64 77  Resp: 10 11  Temp:    SpO2: 100% 100%    General: Awake, no distress.  CV:  Good peripheral perfusion.  Tachycardia heart rate 100 Resp:  Normal effort.  Unlabored, clear to auscultation bilaterally Abd:  No distention.  Soft nontender Other:  Localizes to pain.  Dry oral mucosa   ED Results / Procedures / Treatments   Labs (all labs ordered are listed, but only abnormal results are displayed) Labs Reviewed  COMPREHENSIVE METABOLIC PANEL - Abnormal; Notable for the following components:      Result Value   Sodium 151 (*)    Chloride 112 (*)    Glucose, Bld 109 (*)    Creatinine, Ser 1.03 (*)    Albumin 3.4 (*)    GFR, Estimated 60 (*)    All other components within  normal limits  CBC WITH DIFFERENTIAL/PLATELET - Abnormal; Notable for the following components:   RBC 3.63 (*)    Hemoglobin 11.8 (*)    MCV 104.7 (*)    RDW 15.6 (*)    Abs Immature Granulocytes 0.09 (*)    All other components within normal limits  URINALYSIS, W/ REFLEX TO CULTURE (INFECTION SUSPECTED) - Abnormal; Notable for the following components:   Color, Urine YELLOW (*)    APPearance HAZY (*)    Protein, ur 30 (*)    Leukocytes,Ua SMALL (*)    Bacteria, UA MANY (*)    All other components within normal limits  RESP PANEL BY RT-PCR (RSV, FLU A&B, COVID)  [id number]  URINE CULTURE  LEVETIRACETAM LEVEL  CBG MONITORING, ED     EKG Interpreted by me Normal sinus rhythm rate of 84.  Normal axis and intervals.  Normal QRS ST segments and T waves.   RADIOLOGY  PROCEDURES:  Procedures   MEDICATIONS ORDERED IN ED: Medications  levETIRAcetam (KEPPRA) IVPB 1000 mg/100 mL premix (has no administration in time range)  levETIRAcetam (KEPPRA) IVPB 1000 mg/100 mL premix (0 mg Intravenous Stopped 02/16/23 1141)  sodium chloride 0.9 % bolus 1,000 mL (0 mLs Intravenous Stopped 02/16/23 1501)  cefTRIAXone (ROCEPHIN) 1 g in sodium chloride 0.9 % 100 mL IVPB (0 g Intravenous Stopped 02/16/23 1405)  diazepam (VALIUM) injection 5 mg (5 mg Intravenous Given 02/16/23 1525)     IMPRESSION / MDM / ASSESSMENT AND PLAN / ED COURSE  I reviewed the triage vital signs and the nursing notes.  DDx: Epilepsy, dehydration, AKI, electrolyte derangement, COVID, influenza, UTI, pneumonia  Patient's presentation is most consistent with acute presentation with potential threat to life or bodily function.  Patient with chronic poor health, severe neurocognitive disability who was sent to the ED due to recurrent seizures.  Will give IV fluids for hydration, IV Keppra, check labs and chest x-ray   Clinical Course as of 02/16/23 1541  Fri Feb 16, 2023  1305 Workup shows signs of UTI, otherwise  reassuring. No further seizure activity in ED. Will start abx and plan to DC back to SNF. They note pt does not have a guardian. [PS]  1305 Care d/w pt's PACE team who visited in the ED, including her SW. [PS]    Clinical Course User Index [PS] Sharman Cheek, MD    ----------------------------------------- 2:23 PM on 02/16/2023 ----------------------------------------- Patient is awake, opens her eyes to voice and makes eye contact.  Not able to follow commands, not speaking.  Not yet back to baseline   ----------------------------------------- 3:39 PM on 02/16/2023 ----------------------------------------- At 3:00 PM patient had generalized tonic-clonic seizure.  Discussed with neurology who recommends additional 1 g Keppra, okay to admit here at [facility] and will come evaluate.  Patient was given 5 mg IV of Valium, slow IV push, diluted, and subsequently had a brief episode of apnea.  She was supported through this, did not experience cardiac arrest.  Will keep on nonrebreather for now since she is breathing primarily through her mouth and altered with recent seizure.   FINAL CLINICAL IMPRESSION(S) / ED DIAGNOSES   Final diagnoses:  Nonintractable epilepsy without status epilepticus, unspecified epilepsy type (HCC)  Cystitis     Rx / DC Orders   ED Discharge Orders          Ordered    cephALEXin (KEFLEX) 500 MG capsule  2 times daily        02/16/23 1321             Note:  This document was prepared using Dragon voice recognition software and may include unintentional dictation errors.   Sharman Cheek, MD 02/16/23 1322    Sharman Cheek, MD 02/16/23 1423    Sharman Cheek, MD 02/16/23 [phone]

## 2023-02-16 NOTE — ED Notes (Signed)
Two unsuccessful attempts to get a 2nd line. Patient withdrew from pain and moaned.

## 2023-02-17 DIAGNOSIS — G40919 Epilepsy, unspecified, intractable, without status epilepticus: Secondary | ICD-10-CM | POA: Diagnosis not present

## 2023-02-17 LAB — SODIUM
Sodium: 148 mmol/L — ABNORMAL HIGH (ref 135–145)
Sodium: 147 mmol/L — ABNORMAL HIGH (ref 135–145)
Sodium: 140 mmol/L (ref 135–145)

## 2023-02-17 LAB — CBG MONITORING, ED
Glucose-Capillary: 107 mg/dL — ABNORMAL HIGH (ref 70–99)
Glucose-Capillary: 79 mg/dL (ref 70–99)

## 2023-02-17 MED ORDER — DEXTROSE 5 % IV SOLN: INTRAVENOUS | Status: DC

## 2023-02-17 NOTE — ED Notes (Addendum)
Pt transferred onto hospital bed. Pt repositioned onto left side utilizing pillows. Pt incontinent of solid dark brown stool. New mepilex placed, pt has ~stage 4 pressure wound to coccyx. Padding placed between legs to prevent further pressure injuries.

## 2023-02-17 NOTE — Progress Notes (Signed)
       CROSS COVER NOTE  NAME: Lori Petersen [facility] MRN: [id number] DOB : Nov 14, 1955 ATTENDING PHYSICIAN: Loyce Dys, MD    Date of Service   02/17/2023   HPI/Events of Note   Follow up hypernatremia   Interventions   Assessment/Plan: Last sodium level 0613 am 147 with D5 at 75 ml/ h.  Increase D5 rate 125 ml/h Serial sodium levels every 4 h x 3         Donnie Mesa NP [facility] Cross Cover 7pm-7am - check amion for availability Pager [phone]

## 2023-02-17 NOTE — Progress Notes (Signed)
NEUROLOGY CONSULT FOLLOW UP NOTE   Date of service: February 17, 2023 Patient Name: Lori Petersen [facility] MRN:  [id number] DOB:  29-Apr-1955  Brief HPI: Lori Petersen [facility]ori Petersen is a 68 y.o. female with hx of Down syndrome, dementia, as well as seizures who presented with multiple seizures 01/31.  She had three seizures earlier, and was improving in [facility] emergency department when she had a fourth and remained encephalopathic following this.  She was starting to improve, but not back to baseline and was therefore admitted for observation. Keppra was increased from home dose of 750 mg BID to 1000 mg BID  Interval Hx/subjective   - Being treated with ceftriaxone for UTI - No events overnight per nursing, other than mild hypotension and bradycardia - Hypernatremia - started D5 at 75 cc/hr per primary  Vitals   Current vital signs: BP 91/71   Pulse 71   Temp 98.4 F (36.9 C) (Oral)   Resp (!) 21   Ht 5' (1.524 m)   Wt 65.8 kg   SpO2 96%   BMI 28.32 kg/m  Vital signs in last 24 hours: Temp:  [97.7 F (36.5 C)-98.6 F (37 C)] 98.4 F (36.9 C) (02/01 0453) Pulse Rate:  [54-104] 71 (02/01 0600) Resp:  [0-23] 21 (02/01 0600) BP: (82-131)/(50-106) 91/71 (02/01 0600) SpO2:  [94 %-100 %] 96 % (02/01 0600) Weight:  [65.8 kg] 65.8 kg (01/31 1049)   Vitals:   02/17/23 0325 02/17/23 0453 02/17/23 0515 02/17/23 0600  BP: (!) 94/52  (!) 99/55 91/71  Pulse: (!) 59  (!) 58 71  Resp: 12   (!) 21  Temp:  98.4 F (36.9 C)    TempSrc:  Oral    SpO2: 95%  95% 96%  Weight:      Height:         Body mass index is 28.32 kg/m.  Physical Exam   Constitutional: Appears comfortable in bed, eyes open on examiner's entry  Psych: Affect appropriate to situation. Calm, cooperative Eyes: No scleral injection.  HENT: No OP obstrucion, Normocephalic.  Cardiovascular: Normal rate and regular rhythm.  Respiratory: Effort normal, non-labored breathing.  GI: Soft.  No distension.    Neurologic Examination     Neuro: Mental Status: Patient is awake, she fixates and tracks examiner around Lori room, states "yeah" once and has one episode of garbled / incomprehensible sounds. Does not follow commands  Cranial Nerves: II: No reliable blink to threat for me today. Right pupil is 3 mm -> 1 mm and brisk, left pupil has a large cataract and Lori pupil is nonreactive(this is noted in a previous neurology note from 2016) III,IV, VI: EOMI to orienting to examiner bilaterally  V: Facial sensation is symmetric to eyelash brush VII: Facial movement is symmetric (social smile) VIII: hearing is intact to voice XII: tongue is midline without atrophy or fasciculations.  Motor/sensory She withdraws to noxious stimulation in all four extremities grossly equally, slowly drifts down bilateral UE when moved antigravity by examiner.   Medications  Current Facility-Administered Medications:    cefTRIAXone (ROCEPHIN) 1 g in sodium chloride 0.9 % 100 mL IVPB, 1 g, Intravenous, Q24H, Alvester Morin Francoise Schaumann, MD   dextrose 5 % solution, , Intravenous, Continuous, Manuela Schwartz, NP, Last Rate: 75 mL/hr at 02/17/23 0038, New Bag at 02/17/23 0038   enoxaparin (LOVENOX) injection 40 mg, 40 mg, Subcutaneous, Q24H, Floydene Flock, MD, 40 mg at 02/16/23 2152   levETIRAcetam (KEPPRA) IVPB 1000 mg/100 mL premix, 1,000 mg,  Intravenous, Q12H, Rejeana Brock, MD, Stopped at 02/16/23 2309   LORazepam (ATIVAN) injection 0.5 mg, 0.5 mg, Intravenous, Q2H PRN, Floydene Flock, MD   ondansetron [facility]) tablet 4 mg, 4 mg, Oral, Q6H PRN **OR** ondansetron (ZOFRAN) injection 4 mg, 4 mg, Intravenous, Q6H PRN, Floydene Flock, MD   Oral care mouth rinse, 15 mL, Mouth Rinse, Q2H, Floydene Flock, MD, 15 mL at 02/17/23 0703   Oral care mouth rinse, 15 mL, Mouth Rinse, PRN, Floydene Flock, MD  Current Outpatient Medications:    albuterol (VENTOLIN HFA) 108 (90 Base) MCG/ACT inhaler, Inhale 2 puffs into Lori lungs every 4 (four) hours  as needed., Disp: , Rfl:    Amino Acids-Protein Hydrolys (FEEDING SUPPLEMENT, PRO-STAT SUGAR FREE 64,) LIQD, Take 30 mLs by mouth 2 (two) times daily., Disp: , Rfl:    ascorbic acid (VITAMIN C) 500 MG tablet, Take 500 mg by mouth 2 (two) times daily., Disp: , Rfl:    aspirin EC 81 MG tablet, Take 81 mg by mouth daily., Disp: , Rfl:    budesonide (PULMICORT) 0.25 MG/2ML nebulizer solution, Take 0.25 mg by nebulization 2 (two) times daily., Disp: , Rfl:    cephALEXin (KEFLEX) 500 MG capsule, Take 1 capsule (500 mg total) by mouth 2 (two) times daily., Disp: 14 capsule, Rfl: 0   COMBIGAN 0.2-0.5 % ophthalmic solution, Place 1 drop into both eyes in Lori morning and at bedtime., Disp: , Rfl:    DIMETHICONE-ZINC OXIDE-VIT A-D EX, Apply 1 Tube topically 3 (three) times daily. Apply to Gluteal areas during toileting., Disp: , Rfl:    formoterol (PERFOROMIST) 20 MCG/2ML nebulizer solution, Take 20 mcg by nebulization 2 (two) times daily., Disp: , Rfl:    levETIRAcetam (KEPPRA) 250 MG tablet, Take 250 mg by mouth 2 (two) times daily. (Take with 500mg  tablet to equal 750mg  total), Disp: , Rfl:    levETIRAcetam (KEPPRA) 500 MG tablet, Take 500 mg by mouth 2 (two) times daily. (Take with 250mg  tablet to equal 750mg  total), Disp: , Rfl:    levothyroxine (SYNTHROID) 100 MCG tablet, Take 100 mcg by mouth daily before breakfast., Disp: , Rfl:    omeprazole (PRILOSEC) 20 MG capsule, Take 20 mg by mouth daily., Disp: , Rfl:    pravastatin (PRAVACHOL) 40 MG tablet, Take 40 mg by mouth at bedtime., Disp: , Rfl:    vitamin B-12 (CYANOCOBALAMIN) 1000 MCG tablet, Take 1,000 mcg by mouth daily., Disp: , Rfl:    prenatal vitamin w/FE, FA (PRENATAL 1 + 1) 27-1 MG TABS tablet, Take 1 tablet by mouth daily., Disp: , Rfl:   Labs and Diagnostic Imaging    Basic Metabolic Panel: Recent Labs  Lab 02/16/23 1129 02/16/23 2005 02/16/23 2114 02/17/23 0042 02/17/23 0613  NA 151* 149* 150* 148* 147*  K 3.9 3.7  --   --   --    CL 112* 111  --   --   --   CO2 26 28  --   --   --   GLUCOSE 109* 89  --   --   --   BUN 18 15  --   --   --   CREATININE 1.03* 0.96  --   --   --   CALCIUM 8.9 8.5*  --   --   --   MG  --  2.1 2.1  --   --     CBC: Recent Labs  Lab 02/16/23 1129  WBC 7.1  NEUTROABS 4.2  HGB 11.8*  HCT 38.0  MCV 104.7*  PLT 219    Lipid Panel:  Lab Results  Component Value Date   LDLCALC 98 10/28/2011   HgbA1c:  Lab Results  Component Value Date   HGBA1C 5.9 (H) 05/17/2021   INR  Lab Results  Component Value Date   INR 1.4 (H) 06/25/2021   APTT  Lab Results  Component Value Date   APTT 47 (H) 06/25/2021   AED levels:  Lab Results  Component Value Date   LEVETIRACETA 31.5 12/03/2022    CT Head without contrast 1/31 no acute intracranial process   Assessment    Rilie Glanz [facility] is a 68 y.o. woman with a history of Down syndrome, dementia, and seizures who presents with multiple breakthrough seizures possibly in [facility] setting of urinary tract infection.    Okay to continue Keppra 1 g twice daily, just slightly above recommended dose given renal function with CrCl 48.8 (nearly 50).  As long as she improves, I do not think any further neurological workup will be needed, but if she has further spells or if she remains significantly far from her baseline, then may need transfer for EEG over [facility]ori weekend to [facility]. Given improving today I am hopeful she will continue to improve here and not need transfer  Recommendations  -Continue Keppra increased from home dose of 750 mg BID to 1000 mg BID  Estimated Creatinine Clearance: 48.8 mL/min (by C-G formula based on SCr of 0.96 mg/dL).   CrCl 50 to <80 mL/minute/1.73 m2: 500 mg to 1 g every 12 hours.  CrCl 30 to <50 mL/minute/1.73 m2: 250 to 750 mg every 12 hours. -Neurology will follow along for improvement in exam -Discussed with primary via secure  chat ______________________________________________________________________   Brooke Dare MD-PhD [facility] [phone] [facility] coverage for [facility] is from 8 AM to 4 AM in-house and 4 PM to 8 PM by telephone/video. 8 PM to 8 AM emergent questions or overnight urgent questions should be addressed to Teleneurology On-call or [facility] Petersen neurohospitalist; contact information can be found on AMION

## 2023-02-17 NOTE — Progress Notes (Signed)
Progress Note   Patient: Lori Petersen [facility] LKG:[id number] DOB: 05-Jan-1956 DOA: 02/16/2023     1 DOS: the patient was seen and examined on 02/17/2023   Brief hospital course: From HPI "Jasmina Gendron [facility] is a 68 y.o. female with medical history significant of seizure, HLD, pre-DM, asthma, dCHF, hypothyroidism, GERD, Down syndrome, dementia presenting with recurrent seizure, encephalopathy, UTI.  Limited history in setting of encephalopathy.Patient noted to be resident of [facility].  Baseline history of seizure disorder as well as Down syndrome and dementia.  Had seizure event around a feeding this morning.  Also with seizure last night.  Had return to baseline after episodes.  Had third seizure around 930 this morning which lasted approximately 25 seconds.  EMS subsequently called with patient with persisting lethargy.  No reported fevers or chills.  No reported nausea or vomiting.  No reported head traumas. Noted admission 11/2022 for similar issues.  Presented to the ER afebrile, hemodynamically stable.  Supplemental oxygen place for respiratory support.White count 7.1, hemoglobin 11.8, platelets 219, urinalysis indicative of infection, creatinine 1.03, sodium 151. CXR and CT head grossly stable.  "  Assessment and Plan:  Breakthrough seizure (HCC) Recurrent seizure events in setting of baseline seizure disorder noted at living facility as well as ER  Continue Keppra Keppra level pending  CT scan of the brain did not show any acute pathology Case discussed with neurologist Noted excessive sedation w/ valium (5mg ) Will place on IV prn ativan for now  Follow up further neurology recommendations      UTI (urinary tract infection) Urinalysis indicative of infection  Continue antibiotics Follow-up on culture results     Acute metabolic encephalopathy + generalized lethargy in setting of recurrent seizure CT scan of the brain did not show any acute intracranial pathology Monitor neurochecks  closely   Hypernatremia Na 151 on presentation  Suspect secondary to seizure event-likely transient  Continue IV fluid Monitor sodium level closely   Chronic diastolic CHF (congestive heart failure) (HCC) 2D ECHO 03/2021 w/ EF 55-60%  Appears euvolemic  Monitor input and output     Asthma, mild intermittent Stable from a resp standpoint Cont home inhaler      Hypothyroid Continue Synthroid    Advance Care Planning:   Code Status: Full Code    Consults: Neurology     Subjective:   Patient seen and examined at bedside this morning Unable to provide subjective information as patient is still altered  Physical Exam: Constitutional:      Appearance: She is obese.     Comments: Lethargic at the bedside   HENT:     Head: Normocephalic and atraumatic.     Nose: Nose normal.     Mouth/Throat:     Mouth: Mucous membranes are moist.  Eyes:     Pupils: Pupils are equal, round, and reactive to light.  Cardiovascular:     Rate and Rhythm: Normal rate and regular rhythm.  Pulmonary:     Effort: Pulmonary effort is normal.  Abdominal:     General: Bowel sounds are normal.  Musculoskeletal:     Comments: + generalized weakness and confused   Vitals:   02/17/23 0600 02/17/23 1039 02/17/23 1100 02/17/23 1300  BP: 91/71 (!) 103/54 117/83 (!) 97/42  Pulse: 71 74    Resp: (!) 21 18    Temp:  98.4 F (36.9 C)    TempSrc:  Axillary    SpO2: 96% 96%    Weight:  Height:        Data Reviewed:  I have reviewed below labs as well as CT scan of the brain    Latest Ref Rng & Units 02/16/2023   11:29 AM 01/03/2023    3:57 PM 12/02/2022    5:15 AM  CBC  WBC 4.0 - 10.5 K/uL 7.1  7.9  4.5   Hemoglobin 12.0 - 15.0 g/dL 91.4  78.2  95.6   Hematocrit 36.0 - 46.0 % 38.0  38.6  36.8   Platelets 150 - 400 K/uL 219  234  166        Latest Ref Rng & Units 02/17/2023    6:13 AM 02/17/2023   12:42 AM 02/16/2023    9:14 PM  CMP  Sodium 135 - 145 mmol/L 147  148  150        Family Communication: No family present at bedside  Time spent: 58 minutes  Author: Loyce Dys, MD 02/17/2023 2:31 PM  For on call review [email or web address].

## 2023-02-17 NOTE — ED Notes (Signed)
 Pt repositioned to right side

## 2023-02-17 NOTE — ED Notes (Signed)
Pt repositioned on left side

## 2023-02-17 NOTE — ED Notes (Signed)
Spoke to charge nurse Loraine Grip about ordering replacement mattress. I was educated that this mattress would have to be specially ordered and transferred, Loraine Grip stated she would talk to secretary about the ordering process. Will follow up at a later time. This nurse will continue to rotate sides the pt lays on and use basic prevention measures to keep pressure wound from worsening.

## 2023-02-17 NOTE — ED Notes (Signed)
Pt repositioned to her right side.

## 2023-02-17 NOTE — ED Notes (Signed)
NP Lori Petersen was notified of pts pressure wound on rectum. This wound has been documented on avatar. Wound has been dressed for coverage, waiting further orders.

## 2023-02-17 NOTE — ED Notes (Signed)
 Pt repositioned to left side.

## 2023-02-18 DIAGNOSIS — G40919 Epilepsy, unspecified, intractable, without status epilepticus: Secondary | ICD-10-CM | POA: Diagnosis not present

## 2023-02-18 LAB — GLUCOSE, CAPILLARY
Glucose-Capillary: 67 mg/dL — ABNORMAL LOW (ref 70–99)
Glucose-Capillary: 77 mg/dL (ref 70–99)
Glucose-Capillary: 44 mg/dL — CL (ref 70–99)
Glucose-Capillary: 119 mg/dL — ABNORMAL HIGH (ref 70–99)
Glucose-Capillary: 65 mg/dL — ABNORMAL LOW (ref 70–99)
Glucose-Capillary: 84 mg/dL (ref 70–99)
Glucose-Capillary: 63 mg/dL — ABNORMAL LOW (ref 70–99)
Glucose-Capillary: 59 mg/dL — ABNORMAL LOW (ref 70–99)
Glucose-Capillary: 206 mg/dL — ABNORMAL HIGH (ref 70–99)
Glucose-Capillary: 211 mg/dL — ABNORMAL HIGH (ref 70–99)

## 2023-02-18 LAB — CBC WITH DIFFERENTIAL/PLATELET
Abs Immature Granulocytes: 0.06 10*3/uL (ref 0.00–0.07)
Basophils Absolute: 0.1 10*3/uL (ref 0.0–0.1)
Basophils Relative: 1 %
Eosinophils Absolute: 0.1 10*3/uL (ref 0.0–0.5)
Eosinophils Relative: 1 %
HCT: 32.6 % — ABNORMAL LOW (ref 36.0–46.0)
Hemoglobin: 10.3 g/dL — ABNORMAL LOW (ref 12.0–15.0)
Immature Granulocytes: 1 %
Lymphocytes Relative: 38 %
Lymphs Abs: 2.5 10*3/uL (ref 0.7–4.0)
MCH: 32.5 pg (ref 26.0–34.0)
MCHC: 31.6 g/dL (ref 30.0–36.0)
MCV: 102.8 fL — ABNORMAL HIGH (ref 80.0–100.0)
Monocytes Absolute: 0.3 10*3/uL (ref 0.1–1.0)
Monocytes Relative: 5 %
Neutro Abs: 3.6 10*3/uL (ref 1.7–7.7)
Neutrophils Relative %: 54 %
Platelets: 183 10*3/uL (ref 150–400)
RBC: 3.17 MIL/uL — ABNORMAL LOW (ref 3.87–5.11)
RDW: 15.6 % — ABNORMAL HIGH (ref 11.5–15.5)
WBC: 6.6 10*3/uL (ref 4.0–10.5)
nRBC: 0 % (ref 0.0–0.2)

## 2023-02-18 LAB — MRSA NEXT GEN BY PCR, NASAL: MRSA by PCR Next Gen: DETECTED — AB

## 2023-02-18 LAB — BASIC METABOLIC PANEL
Anion gap: 10 (ref 5–15)
BUN: 12 mg/dL (ref 8–23)
CO2: 23 mmol/L (ref 22–32)
Calcium: 8.3 mg/dL — ABNORMAL LOW (ref 8.9–10.3)
Chloride: 108 mmol/L (ref 98–111)
Creatinine, Ser: 0.87 mg/dL (ref 0.44–1.00)
GFR, Estimated: >60 mL/min (ref >60)
Glucose, Bld: 100 mg/dL — ABNORMAL HIGH (ref 70–99)
Potassium: 2.9 mmol/L — ABNORMAL LOW (ref 3.5–5.1)
Sodium: 141 mmol/L (ref 135–145)

## 2023-02-18 LAB — URINALYSIS, W/ REFLEX TO CULTURE (INFECTION SUSPECTED)
Bilirubin Urine: NEGATIVE
Glucose, UA: NEGATIVE mg/dL
Ketones, ur: NEGATIVE mg/dL
Nitrite: NEGATIVE
Protein, ur: 100 mg/dL — AB
RBC / HPF: >50 RBC/hpf (ref 0–5)
Specific Gravity, Urine: 1.006 (ref 1.005–1.030)
WBC, UA: >50 WBC/hpf (ref 0–5)
pH: 5 (ref 5.0–8.0)

## 2023-02-18 LAB — CBG MONITORING, ED: Glucose-Capillary: 97 mg/dL (ref 70–99)

## 2023-02-18 LAB — LEVETIRACETAM LEVEL: Levetiracetam Lvl: 48.3 ug/mL — ABNORMAL HIGH (ref 10.0–40.0)

## 2023-02-18 MED ORDER — MUPIROCIN 2 % EX OINT
1.0000 | TOPICAL_OINTMENT | Freq: Two times a day (BID) | CUTANEOUS | Status: DC
  Administered 2023-02-18 – 2023-02-21 (×6): 1 via NASAL
  Filled 2023-02-18 (×2): qty 22

## 2023-02-18 MED ORDER — DEXTROSE 50 % IV SOLN
12.5000 g | INTRAVENOUS | Status: AC
  Administered 2023-02-18: 12.5 g via INTRAVENOUS
  Filled 2023-02-18: qty 50

## 2023-02-18 MED ORDER — CHLORHEXIDINE GLUCONATE CLOTH 2 % EX PADS
6.0000 | MEDICATED_PAD | Freq: Every day | CUTANEOUS | Status: DC
  Administered 2023-02-18 – 2023-02-21 (×4): 6 via TOPICAL

## 2023-02-18 MED ORDER — SODIUM CHLORIDE 0.9 % IV BOLUS
500.0000 mL | Freq: Once | INTRAVENOUS | Status: AC
  Administered 2023-02-18: 500 mL via INTRAVENOUS

## 2023-02-18 MED ORDER — DEXTROSE 50 % IV SOLN
25.0000 g | INTRAVENOUS | Status: AC
Start: 2023-02-18 — End: 2023-02-18
  Administered 2023-02-18: 25 g via INTRAVENOUS

## 2023-02-18 MED ORDER — DEXTROSE 50 % IV SOLN
12.5000 g | INTRAVENOUS | Status: AC
  Filled 2023-02-18: qty 50

## 2023-02-18 MED ORDER — KCL IN DEXTROSE-NACL 40-5-0.9 MEQ/L-%-% IV SOLN
INTRAVENOUS | Status: AC
  Filled 2023-02-18 (×2): qty 1000

## 2023-02-18 MED ORDER — POTASSIUM CHLORIDE 20 MEQ PO PACK
40.0000 meq | PACK | ORAL | Status: DC
  Filled 2023-02-18: qty 2

## 2023-02-18 MED ORDER — POTASSIUM CHLORIDE 10 MEQ/100ML IV SOLN
10.0000 meq | INTRAVENOUS | Status: AC
  Administered 2023-02-18 (×6): 10 meq via INTRAVENOUS
  Filled 2023-02-18 (×6): qty 100

## 2023-02-18 NOTE — Progress Notes (Signed)
CBG checked at 04:58  was 67 mg/dL. RN was in emergency with another  patient. NT ordered to rechecked since it was more than an hour and it came up to 77 mg/dL. No D5 administered. Will continue to monitor.MRSA PCR came positive.Lori Billings NP notified without any new orders.

## 2023-02-18 NOTE — Progress Notes (Signed)
NEUROLOGY CONSULT FOLLOW UP NOTE   Date of service: February 18, 2023 Patient Name: Lori Petersen [facility] MRN:  [id number] DOB:  [birth year]  Brief HPI: Lori Petersen is a 68 y.o. female with hx of Down syndrome, dementia, as well as seizures who presented with multiple seizures 01/31.  She had three seizures earlier, and was improving in the emergency department when she had a fourth and remained encephalopathic following this.  She was starting to improve, but not back to baseline and was therefore admitted for observation. Keppra was increased from home dose of 750 mg BID to 1000 mg BID  Interval Hx/subjective   - Being treated with ceftriaxone for UTI - No events overnight per nursing, other than mild low glucose of 67, normalized with D5 drip - Hypernatremia - D5 increased to 125 cc/hr per primary; has normalized   Vitals   Current vital signs: BP 108/65 (BP Location: Left Arm)   Pulse 62   Temp 98.5 F (36.9 C) (Tympanic)   Resp 16   Ht 5' (1.524 m)   Wt 75.1 kg   SpO2 98%   BMI 32.33 kg/m  Vital signs in last 24 hours: Temp:  [98.4 F (36.9 C)-99.2 F (37.3 C)] 98.5 F (36.9 C) (02/02 0456) Pulse Rate:  [57-77] 62 (02/02 0456) Resp:  [16-18] 16 (02/02 0456) BP: (91-117)/(42-83) 108/65 (02/02 0456) SpO2:  [93 %-98 %] 98 % (02/02 0456) Weight:  [75.1 kg] 75.1 kg (02/02 0138)   Vitals:   02/18/23 0014 02/18/23 0138 02/18/23 0139 02/18/23 0456  BP:   108/61 108/65  Pulse:   60 62  Resp:   16 16  Temp: 98.8 F (37.1 C)  98.6 F (37 C) 98.5 F (36.9 C)  TempSrc: Axillary  Oral Tympanic  SpO2:   98% 98%  Weight:  75.1 kg    Height:         Body mass index is 32.33 kg/m.  Physical Exam   Constitutional: Appears comfortable in bed, eyes open on examiner's entry, overall appears slightly brighter than yesterday Psych: Affect appropriate to situation. Calm Eyes: No scleral injection.  HENT: No OP obstrucion, Normocephalic.  Cardiovascular: Normal rate and regular  rhythm.  Respiratory: Effort normal, non-labored breathing.  GI: Soft.  No distension.    Neurologic Examination    Neuro:  Mental Status: Patient is awake, she fixates and tracks examiner around the room, states "yeah" and "huh" in response to her name. States "no" when asked if she can move her legs. Otherwise no verbal output, does not follow commands  Cranial Nerves: II: No reliable blink to threat for me today. Right pupil is 2 mm -> 1 mm and brisk, left pupil has a large cataract and the pupil is nonreactive(this is noted in a previous neurology note from 2016) III,IV, VI: EOMI to orienting to examiner bilaterally  V: Facial sensation is symmetric to eyelash brush VII: Facial movement is symmetric at rest, does not smile today VIII: hearing is intact to voice Motor/sensory She moves slightly 1/5 to stimulation in BLE grossly equally, slowly drifts down bilateral UE when lifted antigravity by examiner, has some slight hand movements bilaterally attempting to adjust her blanket  Medications  Current Facility-Administered Medications:    cefTRIAXone (ROCEPHIN) 1 g in sodium chloride 0.9 % 100 mL IVPB, 1 g, Intravenous, Q24H, Lori Flock, MD, Stopped at 02/17/23 1310   Chlorhexidine Gluconate Cloth 2 % PADS 6 each, 6 each, Topical, Daily, Lori Schwartz, NP  dextrose 50 % solution 12.5 g, 12.5 g, Intravenous, STAT, Lori Petersen, Lori T, MD   enoxaparin (LOVENOX) injection 40 mg, 40 mg, Subcutaneous, Q24H, Lori Flock, MD, 40 mg at 02/17/23 2123   levETIRAcetam (KEPPRA) IVPB 1000 mg/100 mL premix, 1,000 mg, Intravenous, Q12H, Lori Brock, MD, Stopped at 02/17/23 2217   LORazepam (ATIVAN) injection 0.5 mg, 0.5 mg, Intravenous, Q2H PRN, Lori Flock, MD   mupirocin ointment (BACTROBAN) 2 % 1 Application, 1 Application, Nasal, BID, Lori Schwartz, NP   ondansetron [facility]) tablet 4 mg, 4 mg, Oral, Q6H PRN **OR** ondansetron (ZOFRAN) injection 4 mg, 4 mg,  Intravenous, Q6H PRN, Lori Flock, MD   Oral care mouth rinse, 15 mL, Mouth Rinse, Q2H, Lori Flock, MD, 15 mL at 02/18/23 1610   Oral care mouth rinse, 15 mL, Mouth Rinse, PRN, Lori Flock, MD  Labs and Diagnostic Imaging    Basic Metabolic Panel: Recent Labs  Lab 02/16/23 1129 02/16/23 2005 02/16/23 2114 02/17/23 0042 02/17/23 0613 02/17/23 2048 02/18/23 0013  NA 151* 149* 150* 148* 147* 140 141  K 3.9 3.7  --   --   --   --  2.9*  CL 112* 111  --   --   --   --  108  CO2 26 28  --   --   --   --  23  GLUCOSE 109* 89  --   --   --   --  100*  BUN 18 15  --   --   --   --  12  CREATININE 1.03* 0.96  --   --   --   --  0.87  CALCIUM 8.9 8.5*  --   --   --   --  8.3*  MG  --  2.1 2.1  --   --   --   --     CBC: Recent Labs  Lab 02/16/23 1129 02/18/23 0013  WBC 7.1 6.6  NEUTROABS 4.2 3.6  HGB 11.8* 10.3*  HCT 38.0 32.6*  MCV 104.7* 102.8*  PLT 219 183    Lipid Panel:  Lab Results  Component Value Date   LDLCALC 98 10/28/2011   HgbA1c:  Lab Results  Component Value Date   HGBA1C 5.9 (H) 05/17/2021   INR  Lab Results  Component Value Date   INR 1.4 (H) 06/25/2021   APTT  Lab Results  Component Value Date   APTT 47 (H) 06/25/2021   AED levels:  Lab Results  Component Value Date   LEVETIRACETA 48.3 (H) 02/16/2023    CT Head without contrast 1/31 no acute intracranial process   Assessment    Lori Petersen is a 68 y.o. woman with a history of Down syndrome, dementia, and seizures who presents with multiple breakthrough seizures possibly in the setting of urinary tract infection.    As long as she improves, I do not think any further neurological workup will be needed, but if she has further spells or if she remains significantly far from her baseline / not improving, then may need transfer for EEG to [facility] or routine EEG here on 2/3. Given continues to improve today (though slowly) I do think she will continue to improve here and  not need transfer  Recommendations   # Breakthrough seizures, possibly due to UTI -Appreciate UTI treatment and management of other comorbidities per primary team -Continue Keppra increased from home dose of 750 mg BID to 1000  mg BID, max for her current CrCl of 57.5 -Neurology will follow along for improvement in exam  -Discussed with primary via secure chat  # Hypernatremia, resolved  - discontinue D5, monitor for hypoglycemia   Discussed with nursing and primary via secure chat ______________________________________________________________________   Brooke Dare MD-PhD [facility] [phone] [facility] coverage for [facility] is from 8 AM to 4 AM in-house and 4 PM to 8 PM by telephone/video. 8 PM to 8 AM emergent questions or overnight urgent questions should be addressed to Teleneurology On-call or [facility] neurohospitalist; contact information can be found on AMION

## 2023-02-18 NOTE — Progress Notes (Signed)
Hypoglycemic Event  CBG: 59 mg/dL  Treatment: Z61 25 mL (12.5 gm)  Symptoms: None  Follow-up CBG: Time:22:11 CBG Result:211  Possible Reasons for Event: Unknown  Comments/MD notified:Katy Foust NP  Patient's CBG  was 63 mg/dL, she received regular soda and rechecked was 59. I gave her D5 12.5 g and it came up to 211   [facility]

## 2023-02-18 NOTE — Progress Notes (Signed)
The patient is admitted from ED to 2 A room 51. Alert but orientation is questionable as she's nonverbal and as such, her admission profile is not completed. No s/s of pain or any acute distress. Will continue to monitor.

## 2023-02-18 NOTE — Progress Notes (Signed)
Progress Note   Patient: Lori Petersen [facility] JYN:[id number] DOB: Mar 12, 1955 DOA: 02/16/2023     2 DOS: the patient was seen and examined on 02/18/2023     Brief hospital course: From HPI "Lori Petersen [facility] is a 68 y.o. female with medical history significant of seizure, HLD, pre-DM, asthma, dCHF, hypothyroidism, GERD, Down syndrome, dementia presenting with recurrent seizure, encephalopathy, UTI.  Limited history in setting of encephalopathy.Patient noted to be resident of [facility].  Baseline history of seizure disorder as well as Down syndrome and dementia.  Had seizure event around a feeding this morning.  Also with seizure last night.  Had return to baseline after episodes.  Had third seizure around 930 this morning which lasted approximately 25 seconds.  EMS subsequently called with patient with persisting lethargy.  No reported fevers or chills.  No reported nausea or vomiting.  No reported head traumas. Noted admission 11/2022 for similar issues.  Presented to the ER afebrile, hemodynamically stable.  Supplemental oxygen place for respiratory support.White count 7.1, hemoglobin 11.8, platelets 219, urinalysis indicative of infection, creatinine 1.03, sodium 151. CXR and CT head grossly stable.  "   Assessment and Plan:   Breakthrough seizure (HCC) Recurrent seizure events in setting of baseline seizure disorder noted at living facility as well as ER  Continue Keppra CT scan of the brain did not show any acute pathology Case discussed with neurologist Noted excessive sedation w/ valium (5mg ) Will place on IV prn ativan for now  Follow up further neurology recommendations      UTI (urinary tract infection) Urinalysis indicative of infection  Continue antibiotic therapy Follow-up on culture results     Acute metabolic encephalopathy + generalized lethargy in setting of recurrent seizure CT scan of the brain did not show any acute intracranial pathology Monitor neurochecks closely    Hypernatremia Na 151 on presentation  Suspect secondary to seizure event-likely transient  Patient received IV fluid Monitor sodium level closely   Chronic diastolic CHF (congestive heart failure) (HCC) 2D ECHO 03/2021 w/ EF 55-60%  Appears euvolemic  Monitor input and output     Asthma, mild intermittent Stable from a resp standpoint Cont home inhaler      Hypothyroid Continue Synthroid    Advance Care Planning:   Code Status: Full Code    Consults: Neurology      Subjective:  Patient seen and examined at bedside this morning Mental status slightly better today compared to yesterday   Physical Exam: Constitutional:      Appearance: She is obese.     Comments: Lethargic at the bedside   HENT:     Head: Normocephalic and atraumatic.     Nose: Nose normal.     Mouth/Throat:     Mouth: Mucous membranes are moist.  Eyes:     Pupils: Pupils are equal, round, and reactive to light.  Cardiovascular:     Rate and Rhythm: Normal rate and regular rhythm.  Pulmonary:     Effort: Pulmonary effort is normal.  Abdominal:     General: Bowel sounds are normal.  Musculoskeletal:     Comments: + generalized weakness and confused     Data Reviewed:   I have reviewed below labs as well as CT scan of the brain    Latest Ref Rng & Units 02/18/2023   12:13 AM 02/17/2023    8:48 PM 02/17/2023    6:13 AM  BMP  Glucose 70 - 99 mg/dL 865     BUN  8 - 23 mg/dL 12     Creatinine 1.61 - 1.00 mg/dL 0.96     Sodium 045 - 409 mmol/L 141  140  147   Potassium 3.5 - 5.1 mmol/L 2.9     Chloride 98 - 111 mmol/L 108     CO2 22 - 32 mmol/L 23     Calcium 8.9 - 10.3 mg/dL 8.3         Vitals:   81/19/14 1225 02/18/23 1239 02/18/23 1406 02/18/23 1528  BP: (!) 79/64 (!) 79/63 (!) 156/78 121/73  Pulse: (!) 50   66  Resp: 18   18  Temp: (!) 97.5 F (36.4 C)   98.2 F (36.8 C)  TempSrc:      SpO2: 95%   100%  Weight:      Height:          Latest Ref Rng & Units 02/18/2023   12:13 AM  02/16/2023   11:29 AM 01/03/2023    3:57 PM  CBC  WBC 4.0 - 10.5 K/uL 6.6  7.1  7.9   Hemoglobin 12.0 - 15.0 g/dL 78.2  95.6  21.3   Hematocrit 36.0 - 46.0 % 32.6  38.0  38.6   Platelets 150 - 400 K/uL 183  219  234      Author: Loyce Dys, MD 02/18/2023 4:42 PM  For on call review [email or web address].

## 2023-02-18 NOTE — Plan of Care (Signed)
  Problem: Clinical Measurements: Goal: Will remain free from infection Outcome: Progressing Goal: Respiratory complications will improve Outcome: Progressing Goal: Cardiovascular complication will be avoided Outcome: Progressing   Problem: Pain Managment: Goal: General experience of comfort will improve and/or be controlled Outcome: Progressing   Problem: Clinical Measurements: Goal: Ability to maintain clinical measurements within normal limits will improve Outcome: Not Progressing   Problem: Activity: Goal: Risk for activity intolerance will decrease Outcome: Not Progressing

## 2023-02-18 NOTE — Plan of Care (Signed)

## 2023-02-18 NOTE — Progress Notes (Addendum)
Patient currently receiving NS bolus per orders. After intervention, blood pressure is now 156/78   02/18/23 1239  Assess: MEWS Score  BP (!) 79/63  MAP (mmHg) 69  Assess: MEWS Score  MEWS Temp 0  MEWS Systolic 2  MEWS Pulse 1  MEWS RR 0  MEWS LOC 0  MEWS Score 3  MEWS Score Color Yellow  Assess: if the MEWS score is Yellow or Red  Were vital signs accurate and taken at a resting state? Yes  Does the patient meet 2 or more of the SIRS criteria? No  MEWS guidelines implemented  Yes, yellow  Treat  MEWS Interventions Considered administering scheduled or prn medications/treatments as ordered  Take Vital Signs  Increase Vital Sign Frequency  Yellow: Q2hr x1, continue Q4hrs until patient remains green for 12hrs  Escalate  MEWS: Escalate Yellow: Discuss with charge nurse and consider notifying provider and/or RRT  Notify: Charge Nurse/RN  Name of Charge Nurse/RN Notified Lennox Pippins, RN  Provider Notification  Provider Name/Title Rosezetta Schlatter, MD  Date Provider Notified 02/18/23  Time Provider Notified 1239  Method of Notification Face-to-face  Notification Reason Change in status  Provider response See new orders  Date of Provider Response 02/18/23  Time of Provider Response 1240

## 2023-02-19 DIAGNOSIS — G40919 Epilepsy, unspecified, intractable, without status epilepticus: Secondary | ICD-10-CM | POA: Diagnosis not present

## 2023-02-19 LAB — GLUCOSE, CAPILLARY
Glucose-Capillary: 104 mg/dL — ABNORMAL HIGH (ref 70–99)
Glucose-Capillary: 113 mg/dL — ABNORMAL HIGH (ref 70–99)
Glucose-Capillary: 71 mg/dL (ref 70–99)
Glucose-Capillary: 74 mg/dL (ref 70–99)
Glucose-Capillary: 83 mg/dL (ref 70–99)
Glucose-Capillary: 95 mg/dL (ref 70–99)

## 2023-02-19 LAB — CBC WITH DIFFERENTIAL/PLATELET
Abs Immature Granulocytes: 0.07 10*3/uL (ref 0.00–0.07)
Basophils Absolute: 0 10*3/uL (ref 0.0–0.1)
Basophils Relative: 1 %
Eosinophils Absolute: 0.1 10*3/uL (ref 0.0–0.5)
Eosinophils Relative: 2 %
HCT: 31.8 % — ABNORMAL LOW (ref 36.0–46.0)
Hemoglobin: 10.3 g/dL — ABNORMAL LOW (ref 12.0–15.0)
Immature Granulocytes: 1 %
Lymphocytes Relative: 43 %
Lymphs Abs: 2.5 10*3/uL (ref 0.7–4.0)
MCH: 32.4 pg (ref 26.0–34.0)
MCHC: 32.4 g/dL (ref 30.0–36.0)
MCV: 100 fL (ref 80.0–100.0)
Monocytes Absolute: 0.4 10*3/uL (ref 0.1–1.0)
Monocytes Relative: 7 %
Neutro Abs: 2.7 10*3/uL (ref 1.7–7.7)
Neutrophils Relative %: 46 %
Platelets: 173 10*3/uL (ref 150–400)
RBC: 3.18 MIL/uL — ABNORMAL LOW (ref 3.87–5.11)
RDW: 15.5 % (ref 11.5–15.5)
WBC: 5.7 10*3/uL (ref 4.0–10.5)
nRBC: 0 % (ref 0.0–0.2)

## 2023-02-19 LAB — BASIC METABOLIC PANEL
Anion gap: 5 (ref 5–15)
BUN: 8 mg/dL (ref 8–23)
CO2: 25 mmol/L (ref 22–32)
Calcium: 7.8 mg/dL — ABNORMAL LOW (ref 8.9–10.3)
Chloride: 111 mmol/L (ref 98–111)
Creatinine, Ser: 0.78 mg/dL (ref 0.44–1.00)
GFR, Estimated: >60 mL/min (ref >60)
Glucose, Bld: 106 mg/dL — ABNORMAL HIGH (ref 70–99)
Potassium: 4.1 mmol/L (ref 3.5–5.1)
Sodium: 141 mmol/L (ref 135–145)

## 2023-02-19 MED ORDER — MIDODRINE HCL 5 MG PO TABS
10.0000 mg | ORAL_TABLET | Freq: Three times a day (TID) | ORAL | Status: DC
  Administered 2023-02-19 – 2023-02-21 (×7): 10 mg via ORAL
  Filled 2023-02-19 (×8): qty 2

## 2023-02-19 MED ORDER — SODIUM CHLORIDE 0.9 % IV BOLUS
500.0000 mL | Freq: Once | INTRAVENOUS | Status: AC
  Administered 2023-02-19: 500 mL via INTRAVENOUS

## 2023-02-19 NOTE — Plan of Care (Signed)
  Problem: Clinical Measurements: Goal: Cardiovascular complication will be avoided Outcome: Progressing   Problem: Education: Goal: Knowledge of General Education information will improve Description: Including pain rating scale, medication(s)/side effects and non-pharmacologic comfort measures Outcome: Not Progressing   Problem: Clinical Measurements: Goal: Ability to maintain clinical measurements within normal limits will improve Outcome: Not Progressing Goal: Will remain free from infection Outcome: Not Progressing Goal: Respiratory complications will improve Outcome: Not Progressing   Problem: Activity: Goal: Risk for activity intolerance will decrease Outcome: Not Progressing   Problem: Skin Integrity: Goal: Risk for impaired skin integrity will decrease Outcome: Not Progressing

## 2023-02-19 NOTE — Progress Notes (Signed)
NEUROLOGY CONSULT FOLLOW UP NOTE   Date of service: February 19, 2023 Patient Name: Lori Petersen MRN:  [id number] DOB:  01/11/1956  Brief HPI: Lori Petersen is a 68 y.o. female with hx of Down syndrome, dementia, as well as seizures who presented with multiple seizures 01/31.  She had three seizures earlier, and was improving in the emergency department when she had a fourth and remained encephalopathic following this.  She was starting to improve, but not back to baseline and was therefore admitted for observation. Keppra was increased from home dose of 750 mg BID to 1000 mg BID  Interval Hx/subjective   - Ate breakfast well with nurse - Brighter today  - Mildly hypotensive at times   Vitals   Current vital signs: BP (!) 83/66   Pulse (!) 51   Temp 97.9 F (36.6 C) (Oral)   Resp 16   Ht 5' (1.524 m)   Wt 75.1 kg   SpO2 98%   BMI 32.33 kg/m  Vital signs in last 24 hours: Temp:  [97.5 F (36.4 C)-98.5 F (36.9 C)] 97.9 F (36.6 C) (02/03 0805) Pulse Rate:  [46-66] 51 (02/03 0805) Resp:  [16-18] 16 (02/03 0805) BP: (79-156)/(48-131) 83/66 (02/03 0805) SpO2:  [88 %-100 %] 98 % (02/03 0805)   Vitals:   02/19/23 0000 02/19/23 0421 02/19/23 0750 02/19/23 0805  BP: (!) 91/52 (!) 156/131 (!) 84/50 (!) 83/66  Pulse: (!) 59 (!) 46 (!) 50 (!) 51  Resp: 18 16 16 16   Temp: 98 F (36.7 C) (!) 97.5 F (36.4 C) 98.3 F (36.8 C) 97.9 F (36.6 C)  TempSrc: Oral Oral  Oral  SpO2: 98% 100% 100% 98%  Weight:      Height:         Body mass index is 32.33 kg/m.  Physical Exam   Constitutional: Appears comfortable in bed, eyes open on examiner's entry, continues to appear brighter. Points to TV, mostly grunts in responses to questions. Not clearly following commands Psych: Affect appropriate to situation. Calm Eyes: No scleral injection.  HENT: No OP obstrucion, Normocephalic.  Cardiovascular: Normal rate and regular rhythm.  Respiratory: Effort normal, non-labored breathing.   GI: Soft.  No distension.    Neurologic Examination    Neuro:  Mental Status: Patient is awake, she fixates and tracks examiner around the room, states "yeah" and "huh" in response to some questions. Otherwise no verbal output, does not follow commands  Cranial Nerves: II: blinks to threat bilaterally for me today. Right pupil is 2 mm -> 1 mm and brisk, left pupil has a large cataract and the pupil is nonreactive(this is noted in a previous neurology note from 2016) III,IV, VI: EOMI to orienting to examiner bilaterally  VII: Facial movement with slight right facial droop on social smile today VIII: hearing is intact to voice Motor/sensory Points to TV with RUE. Grossly equally holds up BUE. Wiggles bilateral toes slightly to touch, not clearly to command  Medications  Current Facility-Administered Medications:    cefTRIAXone (ROCEPHIN) 1 g in sodium chloride 0.9 % 100 mL IVPB, 1 g, Intravenous, Q24H, Floydene Flock, MD, Last Rate: 200 mL/hr at 02/18/23 1000, 1 g at 02/18/23 1000   Chlorhexidine Gluconate Cloth 2 % PADS 6 each, 6 each, Topical, Daily, Manuela Schwartz, NP, 6 each at 02/19/23 0912   dextrose 5 % and 0.9 % NaCl with KCl 40 mEq/L infusion, , Intravenous, Continuous, Djan, Scarlette Calico, MD, Last Rate: 50 mL/hr at  02/18/23 2233, New Bag at 02/18/23 2233   enoxaparin (LOVENOX) injection 40 mg, 40 mg, Subcutaneous, Q24H, Floydene Flock, MD, 40 mg at 02/18/23 2145   levETIRAcetam (KEPPRA) IVPB 1000 mg/100 mL premix, 1,000 mg, Intravenous, Q12H, Rejeana Brock, MD, Last Rate: 400 mL/hr at 02/19/23 0923, 1,000 mg at 02/19/23 3244   LORazepam (ATIVAN) injection 0.5 mg, 0.5 mg, Intravenous, Q2H PRN, Floydene Flock, MD   midodrine (PROAMATINE) tablet 10 mg, 10 mg, Oral, TID WC, Loyce Dys, MD   mupirocin ointment (BACTROBAN) 2 % 1 Application, 1 Application, Nasal, BID, Manuela Schwartz, NP, 1 Application at 02/19/23 0912   ondansetron (ZOFRAN) tablet 4 mg, 4 mg, Oral,  Q6H PRN **OR** ondansetron (ZOFRAN) injection 4 mg, 4 mg, Intravenous, Q6H PRN, Floydene Flock, MD   Oral care mouth rinse, 15 mL, Mouth Rinse, Q2H, Floydene Flock, MD, 15 mL at 02/19/23 0911   Oral care mouth rinse, 15 mL, Mouth Rinse, PRN, Floydene Flock, MD  Labs and Diagnostic Imaging    Basic Metabolic Panel: Recent Labs  Lab 02/16/23 1129 02/16/23 2005 02/16/23 2114 02/17/23 0042 02/17/23 0102 02/17/23 2048 02/18/23 0013 02/19/23 0409  NA 151* 149* 150* 148* 147* 140 141 141  K 3.9 3.7  --   --   --   --  2.9* 4.1  CL 112* 111  --   --   --   --  108 111  CO2 26 28  --   --   --   --  23 25  GLUCOSE 109* 89  --   --   --   --  100* 106*  BUN 18 15  --   --   --   --  12 8  CREATININE 1.03* 0.96  --   --   --   --  0.87 0.78  CALCIUM 8.9 8.5*  --   --   --   --  8.3* 7.8*  MG  --  2.1 2.1  --   --   --   --   --     CBC: Recent Labs  Lab 02/16/23 1129 02/18/23 0013 02/19/23 0409  WBC 7.1 6.6 5.7  NEUTROABS 4.2 3.6 2.7  HGB 11.8* 10.3* 10.3*  HCT 38.0 32.6* 31.8*  MCV 104.7* 102.8* 100.0  PLT 219 183 173    Lipid Panel:  Lab Results  Component Value Date   LDLCALC 98 10/28/2011   HgbA1c:  Lab Results  Component Value Date   HGBA1C 5.9 (H) 05/17/2021   INR  Lab Results  Component Value Date   INR 1.4 (H) 06/25/2021   APTT  Lab Results  Component Value Date   APTT 47 (H) 06/25/2021   AED levels:  Lab Results  Component Value Date   LEVETIRACETA 48.3 (H) 02/16/2023    CT Head without contrast 1/31 no acute intracranial process   Assessment    Lori Petersen is a 68 y.o. woman with a history of Down syndrome, dementia, and seizures who presents with multiple breakthrough seizures possibly in the setting of urinary tract infection.    Given continues to improve today, no further inpatient neurological workup needed.  Recommendations   # Breakthrough seizures, possibly due to UTI -Appreciate UTI treatment and management of other  comorbidities per primary team -Continue Keppra increased from home dose of 750 mg BID to 1000 mg BID, max for her current CrCl of 57.5 -Neurology will sign off -Discussed with  primary via secure chat and discussed with nursing in person ______________________________________________________________________   Brooke Dare MD-PhD [facility] [phone] [facility] coverage for [facility] is from 8 AM to 4 AM in-house and 4 PM to 8 PM by telephone/video. 8 PM to 8 AM emergent questions or overnight urgent questions should be addressed to Teleneurology On-call or [facility] neurohospitalist; contact information can be found on AMION

## 2023-02-19 NOTE — Progress Notes (Signed)
Progress Note   Patient: Lori Petersen [facility] ZHY:[id number] DOB: 25-Jan-1955 DOA: 02/16/2023     3 DOS: the patient was seen and examined on 02/19/2023     Brief hospital course: From HPI "[name] is a 68 y.o. female with medical history significant of seizure, HLD, pre-DM, asthma, dCHF, hypothyroidism, GERD, Down syndrome, dementia presenting with recurrent seizure, encephalopathy, UTI.  Limited history in setting of encephalopathy.Patient noted to be resident of [facility].  Baseline history of seizure disorder as well as Down syndrome and dementia.  Had seizure event around a feeding this morning.  Also with seizure last night.  Had return to baseline after episodes.  Had third seizure around 930 this morning which lasted approximately 25 seconds.  EMS subsequently called with patient with persisting lethargy.  No reported fevers or chills.  No reported nausea or vomiting.  No reported head traumas. Noted admission 11/2022 for similar issues.  Presented to the ER afebrile, hemodynamically stable.  Supplemental oxygen place for respiratory support.White count 7.1, hemoglobin 11.8, platelets 219, urinalysis indicative of infection, creatinine 1.03, sodium 151. CXR and CT head grossly stable.  "   Assessment and Plan:   Breakthrough seizure (HCC) Recurrent seizure events in setting of baseline seizure disorder noted at living facility as well as ER  Continue Keppra CT scan of the brain did not show any acute pathology Noted excessive sedation w/ valium (5mg ) As needed Ativan Was seen by neurologist with concerns of seizures however this have resolved Neurologist have signed off Case discussed with neurologist today     Hypotension Patient noted to be hypotensive today Received bolus of IV fluid as well as addition of midodrine   UTI (urinary tract infection) Urinalysis indicative of infection  Continue ceftriaxone  urine culture showing no growth to date     Acute metabolic  encephalopathy + generalized lethargy in setting of recurrent seizure CT scan of the brain did not show any acute intracranial pathology Monitor neurochecks closely    Hypernatremia-improved Na 151 on presentation  Suspect secondary to seizure event-likely transient  Patient received IV fluid Monitor sodium level closely   Chronic diastolic CHF (congestive heart failure) (HCC) 2D ECHO 03/2021 w/ EF 55-60%  Appears euvolemic  Monitor input and output     Asthma, mild intermittent Stable from a resp standpoint Continue inhalers as needed   Hypothyroid Continue Synthroid    Advance Care Planning:   Code Status: Full Code    Consults: Neurology      Subjective:  Patient seen and examined at bedside this morning Mental status continues to improve  neurologist have signed off   Physical Exam: Constitutional:      Appearance: She is obese.     Comments: Lethargic at the bedside   HENT:     Head: Normocephalic and atraumatic.     Nose: Nose normal.     Mouth/Throat:     Mouth: Mucous membranes are moist.  Eyes:     Pupils: Pupils are equal, round, and reactive to light.  Cardiovascular:     Rate and Rhythm: Normal rate and regular rhythm.  Pulmonary:     Effort: Pulmonary effort is normal.  Abdominal:     General: Bowel sounds are normal.       Data Reviewed:     Latest Ref Rng & Units 02/19/2023    4:09 AM 02/18/2023   12:13 AM 02/17/2023    8:48 PM  BMP  Glucose 70 - 99 mg/dL 295  100    BUN 8 - 23 mg/dL 8  12    Creatinine 1.61 - 1.00 mg/dL 0.96  0.45    Sodium 409 - 145 mmol/L 141  141  140   Potassium 3.5 - 5.1 mmol/L 4.1  2.9    Chloride 98 - 111 mmol/L 111  108    CO2 22 - 32 mmol/L 25  23    Calcium 8.9 - 10.3 mg/dL 7.8  8.3       Vitals:   02/19/23 0805 02/19/23 1049 02/19/23 1154 02/19/23 1609  BP: (!) 83/66 (!) 88/62 (!) 73/63 (!) 110/98  Pulse: (!) 51  (!) 58 98  Resp: 16 16 18 16   Temp: 97.9 F (36.6 C)  98.4 F (36.9 C) 98.4 F (36.9 C)   TempSrc: Oral     SpO2: 98% 98% 90% 100%  Weight:      Height:          Latest Ref Rng & Units 02/19/2023    4:09 AM 02/18/2023   12:13 AM 02/16/2023   11:29 AM  CBC  WBC 4.0 - 10.5 K/uL 5.7  6.6  7.1   Hemoglobin 12.0 - 15.0 g/dL 81.1  91.4  78.2   Hematocrit 36.0 - 46.0 % 31.8  32.6  38.0   Platelets 150 - 400 K/uL 173  183  219      Author: Loyce Dys, MD 02/19/2023 4:18 PM  For on call review [email or web address].

## 2023-02-20 DIAGNOSIS — G40919 Epilepsy, unspecified, intractable, without status epilepticus: Secondary | ICD-10-CM | POA: Diagnosis not present

## 2023-02-20 LAB — CBC WITH DIFFERENTIAL/PLATELET
Abs Immature Granulocytes: 0.34 10*3/uL — ABNORMAL HIGH (ref 0.00–0.07)
Basophils Absolute: 0.1 10*3/uL (ref 0.0–0.1)
Basophils Relative: 1 %
Eosinophils Absolute: 0.1 10*3/uL (ref 0.0–0.5)
Eosinophils Relative: 2 %
HCT: 33.4 % — ABNORMAL LOW (ref 36.0–46.0)
Hemoglobin: 10.8 g/dL — ABNORMAL LOW (ref 12.0–15.0)
Immature Granulocytes: 5 %
Lymphocytes Relative: 38 %
Lymphs Abs: 2.6 10*3/uL (ref 0.7–4.0)
MCH: 32.3 pg (ref 26.0–34.0)
MCHC: 32.3 g/dL (ref 30.0–36.0)
MCV: 100 fL (ref 80.0–100.0)
Monocytes Absolute: 0.3 10*3/uL (ref 0.1–1.0)
Monocytes Relative: 5 %
Neutro Abs: 3.5 10*3/uL (ref 1.7–7.7)
Neutrophils Relative %: 49 %
Platelets: 190 10*3/uL (ref 150–400)
RBC: 3.34 MIL/uL — ABNORMAL LOW (ref 3.87–5.11)
RDW: 15.8 % — ABNORMAL HIGH (ref 11.5–15.5)
WBC: 7 10*3/uL (ref 4.0–10.5)
nRBC: 0 % (ref 0.0–0.2)

## 2023-02-20 LAB — GLUCOSE, CAPILLARY
Glucose-Capillary: 109 mg/dL — ABNORMAL HIGH (ref 70–99)
Glucose-Capillary: 115 mg/dL — ABNORMAL HIGH (ref 70–99)
Glucose-Capillary: 64 mg/dL — ABNORMAL LOW (ref 70–99)
Glucose-Capillary: 71 mg/dL (ref 70–99)
Glucose-Capillary: 86 mg/dL (ref 70–99)
Glucose-Capillary: 91 mg/dL (ref 70–99)
Glucose-Capillary: 92 mg/dL (ref 70–99)

## 2023-02-20 LAB — BASIC METABOLIC PANEL
Anion gap: 9 (ref 5–15)
BUN: 15 mg/dL (ref 8–23)
CO2: 23 mmol/L (ref 22–32)
Calcium: 7.8 mg/dL — ABNORMAL LOW (ref 8.9–10.3)
Chloride: 110 mmol/L (ref 98–111)
Creatinine, Ser: 0.82 mg/dL (ref 0.44–1.00)
GFR, Estimated: >60 mL/min (ref >60)
Glucose, Bld: 74 mg/dL (ref 70–99)
Potassium: 5.3 mmol/L — ABNORMAL HIGH (ref 3.5–5.1)
Sodium: 142 mmol/L (ref 135–145)

## 2023-02-20 LAB — URINE CULTURE: Culture: NO GROWTH

## 2023-02-20 MED ORDER — SODIUM ZIRCONIUM CYCLOSILICATE 10 G PO PACK
10.0000 g | PACK | Freq: Two times a day (BID) | ORAL | Status: AC
  Administered 2023-02-20 (×2): 10 g via ORAL
  Filled 2023-02-20 (×3): qty 1

## 2023-02-20 NOTE — Progress Notes (Addendum)
 Progress Note   Patient: Lori Petersen [facility] FMW:[id number] DOB: [birth year] DOA: 02/16/2023     4 DOS: the patient was seen and examined on 02/20/2023    Brief hospital course: From HPI [name] [facility] is a 68 y.o. female with medical history significant of seizure, HLD, pre-DM, asthma, dCHF, hypothyroidism, GERD, Down syndrome, dementia presenting with recurrent seizure, encephalopathy, UTI.  Limited history in setting of encephalopathy.Patient noted to be resident of [facility].  Baseline history of seizure disorder as well as Down syndrome and dementia.  Had seizure event around a feeding this morning.  Also with seizure last night.  Had return to baseline after episodes.  Had third seizure around 930 this morning which lasted approximately 25 seconds.  EMS subsequently called with patient with persisting lethargy.  No reported fevers or chills.  No reported nausea or vomiting.  No reported head traumas. Noted admission 11/2022 for similar issues.  Presented to the ER afebrile, hemodynamically stable.  Supplemental oxygen place for respiratory support.White count 7.1, hemoglobin 11.8, platelets 219, urinalysis indicative of infection, creatinine 1.03, sodium 151. CXR and CT head grossly stable.     Assessment and Plan:   Breakthrough seizure (HCC) Recurrent seizure events in setting of baseline seizure disorder noted at living facility as well as ER  Continue Keppra  CT scan of the brain did not show any acute pathology Noted excessive sedation w/ valium  (5mg ) As needed Ativan  Was seen by neurologist with concerns of seizures however this have resolved Neurologist have signed off    Hypokalemia Patient received a dose of Lokelma  today Monitor potassium closely   Hypotension-chronic According to patient's physician at [facility] she runs chronically low     UTI (urinary tract infection) Urinalysis indicative of infection  Has completed 5 days course of antibiotics  urine culture showing no  growth to date     Acute metabolic encephalopathy-improved Currently back to baseline CT scan of the brain did not show any acute intracranial pathology Monitor neurochecks closely I discussed with patient's physician at [facility]      Hypernatremia-improved Na 151 on presentation  Suspect secondary to seizure event-likely transient  Patient received IV fluid Monitor sodium level closely   Chronic diastolic CHF (congestive heart failure) (HCC) 2D ECHO 03/2021 w/ EF 55-60%  Appears euvolemic  Monitor input and output     Asthma, mild intermittent Stable from a resp standpoint Continue inhalers as needed   Hypothyroid Continue Synthroid    Pressure ulcers Wound type: 1.  Stage 3 Pressure Injury sacrum, Stage 2 Pressure Injury L ischium  2. Stage 1 Pressure Injury R lateral heel  Pressure Injury POA: Yes Measurement: 1.  Stage 3 Sacrum 3 cm x 3 cm x 1 cm 100% red moist, no odor  2.  L ischium Stage 2 PI 1 cm x 1 cm x 0.1 cm 100% pink moist  3.  R lateral heel Stage 1 PI 1 cm x 1 cm Wound care on board we appreciate input   Advance Care Planning:   Code Status: Full Code    Consults: Neurology      Subjective:  Patient still nonverbal Subjective information limited based on patient's medical condition Potassium level elevated today   Physical Exam: Constitutional:      Appearance: She is obese.     Comments: Lethargic at the bedside   HENT:     Head: Normocephalic and atraumatic.     Nose: Nose normal.     Mouth/Throat:  Mouth: Mucous membranes are moist.  Eyes:     Pupils: Pupils are equal, round, and reactive to light.  Cardiovascular:     Rate and Rhythm: Normal rate and regular rhythm.  Pulmonary:     Effort: Pulmonary effort is normal.  Abdominal:     General: Bowel sounds are normal.        Data Reviewed:  Vitals:   02/19/23 2337 02/20/23 0346 02/20/23 0813 02/20/23 1242  BP: 99/64 105/68 131/84 (!) 108/55  Pulse: 70 (!) 49 77 86  Resp: 17 16   16   Temp: 98.2 F (36.8 C) 98.2 F (36.8 C) 98.1 F (36.7 C) 97.8 F (36.6 C)  TempSrc: Oral   Oral  SpO2: 100% 100% 90% 94%  Weight:      Height:          Latest Ref Rng & Units 02/20/2023    7:55 AM 02/19/2023    4:09 AM 02/18/2023   12:13 AM  CBC  WBC 4.0 - 10.5 K/uL 7.0  5.7  6.6   Hemoglobin 12.0 - 15.0 g/dL 89.1  89.6  89.6   Hematocrit 36.0 - 46.0 % 33.4  31.8  32.6   Platelets 150 - 400 K/uL 190  173  183        Latest Ref Rng & Units 02/20/2023    7:55 AM 02/19/2023    4:09 AM 02/18/2023   12:13 AM  BMP  Glucose 70 - 99 mg/dL 74  893  899   BUN 8 - 23 mg/dL 15  8  12    Creatinine 0.44 - 1.00 mg/dL 9.17  9.21  9.12   Sodium 135 - 145 mmol/L 142  141  141   Potassium 3.5 - 5.1 mmol/L 5.3  4.1  2.9   Chloride 98 - 111 mmol/L 110  111  108   CO2 22 - 32 mmol/L 23  25  23    Calcium 8.9 - 10.3 mg/dL 7.8  7.8  8.3      Author: Drue ONEIDA Potter, MD 02/20/2023 3:58 PM  For on call review [email or web address].

## 2023-02-20 NOTE — TOC Initial Note (Addendum)
 Transition of Care [facility]) - Initial/Assessment Note    Patient Details  Name: Lori Petersen [facility] MRN: [id number] Date of Birth: [birth year]  Transition of Care [facility]) CM/SW Contact:    Tomasa JAYSON Childes, RN Phone Number: 02/20/2023, 11:57 AM  Clinical Narrative:                 Beatris with Jenna, PACE nurse to give update on patient and to discuss discharge planning. Patient will return to [facility] at  discharge.   12:50PM Retrieved message from [facility] nurse Jenna. Dr. Eleanore, PACE MD is requesting to speak with MD. Attending notified.         Patient Goals and CMS Choice            Expected Discharge Plan and Services                                              Prior Living Arrangements/Services                       Activities of Daily Living      Permission Sought/Granted                  Emotional Assessment              Admission diagnosis:  Seizure (HCC) [R56.9] Cystitis [N30.90] Nonintractable epilepsy without status epilepticus, unspecified epilepsy type (HCC) [G40.909] Patient Active Problem List   Diagnosis Date Noted   Acute encephalopathy 02/16/2023   Breakthrough seizure (HCC) 12/01/2022   Hydronephrosis with urinary obstruction due to ureteral calculus    Respiratory failure (HCC) 05/18/2021   Pneumonia 05/17/2021   Dementia (HCC)    HLD (hyperlipidemia)    Sepsis (HCC)    Hypernatremia 03/26/2021   Thrombocytopenia (HCC) 03/26/2021   Asthma, mild intermittent 03/25/2021   Nausea & vomiting 03/25/2021   Bacteremia due to Escherichia coli 03/25/2021   UTI (urinary tract infection) 03/25/2021   Down syndrome 03/25/2021   AKI (acute kidney injury) (HCC) 03/24/2021   Septic shock (HCC)    Ureteral calculus    Seizures (HCC) 12/03/2018   Obesity, Class III, 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   Chronic diastolic CHF (congestive heart failure) (HCC) 11/06/2018   Acute metabolic encephalopathy 11/06/2018    Hypothyroid 11/06/2018   COVID-19 virus infection 11/03/2018   COVID-19    Acute respiratory failure with hypoxia (HCC) 11/02/2018   Left tibial fracture 04/27/2017   Right tibial fracture 04/27/2017   Problems with swallowing and mastication    Blood in stool    Bradycardia 05/14/2015   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 [place] [address] Phone: [phone] Fax: [phone]     Social Drivers of Health (SDOH) Social History: SDOH Screenings   Food Insecurity: Patient Unable To Answer (02/18/2023)  Housing: Patient Unable To Answer (02/18/2023)  Transportation Needs: Patient Unable To Answer (02/18/2023)  Utilities: Patient Unable To Answer (12/05/2022)  Social Connections: Unknown (02/18/2023)  Tobacco Use: Low Risk  (01/03/2023)   SDOH Interventions:     Readmission Risk Interventions    06/29/2021   11:13 AM 06/27/2021   11:00 AM  Readmission Risk Prevention Plan  Transportation Screening Complete  Complete  PCP or Specialist Appt within 3-5 Days Complete   HRI or Home Care Consult Complete   Social Work Consult for Recovery Care Planning/Counseling Complete   Palliative Care Screening Complete   Medication Review Oceanographer) Complete Complete  PCP or Specialist appointment within 3-5 days of discharge  Complete  HRI or Home Care Consult  Complete  SW Recovery Care/Counseling Consult  Not Complete  SW Consult Not Complete Comments  NA  Palliative Care Screening  Complete  Skilled Nursing Facility  Complete

## 2023-02-20 NOTE — Consult Note (Addendum)
 WOC Nurse Consult Note: this patient is known to WOC team from previous admissions with a Stage 3 Pressure Injury sacrum  Reason for Consult: wound on sacrum  Wound type: 1.  Stage 3 vs 4 Pressure Injury sacrum, Stage 2 Pressure Injury L ischium  2. Stage 1 Pressure Injury R lateral heel  Pressure Injury POA: Yes Measurement: 1.  Stage 3 vs 4 Sacrum 3 cm x 3 cm x 1 cm 100% red moist, no odor  2.  L ischium Stage 2 PI 1 cm x 1 cm x 0.1 cm 100% pink moist  3.  R lateral heel Stage 1 PI 1 cm x 1 cm  Wound bed: as above  Drainage (amount, consistency, odor) minimal serosanguinous  Periwound:evidence of scar tissue with pink epithelium  Dressing procedure/placement/frequency: Cleanse sacral wound with Vashe wound cleanser Soila [phone]), using a Q tip applicator insert Vashe moistened gauze into wound bed, cover with dry gauze and silicone foam.   Apply silicone foam to L ischium Stage 2, lift foam daily to assess., Re-consult WOC team if develops any necrotic tissue (brown/yellow/tan/black).  Apply silicone foam to R heel, maintain free floating position utilizing pillows to offload pressure.  POC discussed with bedside nurse. WOC team will not follow. Re-consult if further needs arise.   Thank you,    Powell Bar MSN, RN-BC, [employer] [phone]

## 2023-02-20 NOTE — Plan of Care (Signed)

## 2023-02-21 DIAGNOSIS — G40919 Epilepsy, unspecified, intractable, without status epilepticus: Secondary | ICD-10-CM | POA: Diagnosis not present

## 2023-02-21 LAB — CBC WITH DIFFERENTIAL/PLATELET
Abs Immature Granulocytes: 0.12 10*3/uL — ABNORMAL HIGH (ref 0.00–0.07)
Basophils Absolute: 0.1 10*3/uL (ref 0.0–0.1)
Basophils Relative: 1 %
Eosinophils Absolute: 0.1 10*3/uL (ref 0.0–0.5)
Eosinophils Relative: 1 %
HCT: 33.6 % — ABNORMAL LOW (ref 36.0–46.0)
Hemoglobin: 10.8 g/dL — ABNORMAL LOW (ref 12.0–15.0)
Immature Granulocytes: 2 %
Lymphocytes Relative: 30 %
Lymphs Abs: 2 10*3/uL (ref 0.7–4.0)
MCH: 32.5 pg (ref 26.0–34.0)
MCHC: 32.1 g/dL (ref 30.0–36.0)
MCV: 101.2 fL — ABNORMAL HIGH (ref 80.0–100.0)
Monocytes Absolute: 0.3 10*3/uL (ref 0.1–1.0)
Monocytes Relative: 5 %
Neutro Abs: 4 10*3/uL (ref 1.7–7.7)
Neutrophils Relative %: 61 %
Platelets: 183 10*3/uL (ref 150–400)
RBC: 3.32 MIL/uL — ABNORMAL LOW (ref 3.87–5.11)
RDW: 15.6 % — ABNORMAL HIGH (ref 11.5–15.5)
WBC: 6.6 10*3/uL (ref 4.0–10.5)
nRBC: 0 % (ref 0.0–0.2)

## 2023-02-21 LAB — GLUCOSE, CAPILLARY
Glucose-Capillary: 76 mg/dL (ref 70–99)
Glucose-Capillary: 83 mg/dL (ref 70–99)

## 2023-02-21 LAB — BASIC METABOLIC PANEL
Anion gap: 7 (ref 5–15)
BUN: 15 mg/dL (ref 8–23)
CO2: 27 mmol/L (ref 22–32)
Calcium: 8.1 mg/dL — ABNORMAL LOW (ref 8.9–10.3)
Chloride: 107 mmol/L (ref 98–111)
Creatinine, Ser: 0.99 mg/dL (ref 0.44–1.00)
GFR, Estimated: >60 mL/min (ref >60)
Glucose, Bld: 85 mg/dL (ref 70–99)
Potassium: 3.9 mmol/L (ref 3.5–5.1)
Sodium: 141 mmol/L (ref 135–145)

## 2023-02-21 MED ORDER — LEVETIRACETAM 1000 MG PO TABS: 1000.0000 mg | ORAL_TABLET | Freq: Two times a day (BID) | ORAL | Status: DC

## 2023-02-21 MED ORDER — MIDODRINE HCL 10 MG PO TABS: 10.0000 mg | ORAL_TABLET | Freq: Three times a day (TID) | ORAL | Status: AC

## 2023-02-21 NOTE — NC FL2 (Signed)
 [place]  MEDICAID FL2 LEVEL OF CARE FORM     IDENTIFICATION  Patient Name: Lori Petersen [facility] Birthdate: [birth year] Sex: female Admission Date (Current Location): 02/16/2023  [facility] and [place] Number:  Chiropodist and Address:  [facility], [address] [place], [place] [address]      Provider Number: [id number]  Attending Physician Name and Address:  Marsa Edelman, DO  Relative Name and Phone Number:  Cousin,Dorothy (Sister)  [phone] [facility])    Current Level of Care: Hospital Recommended Level of Care: Skilled Nursing Facility Prior Approval Number:    Date Approved/Denied:   PASRR Number: [id number]  Discharge Plan: Home    Current Diagnoses: Patient Active Problem List   Diagnosis Date Noted   Acute encephalopathy 02/16/2023   Breakthrough seizure (HCC) 12/01/2022   Hydronephrosis with urinary obstruction due to ureteral calculus    Respiratory failure (HCC) 05/18/2021   Pneumonia 05/17/2021   Dementia (HCC)    HLD (hyperlipidemia)    Sepsis (HCC)    Hypernatremia 03/26/2021   Thrombocytopenia (HCC) 03/26/2021   Asthma, mild intermittent 03/25/2021   Nausea & vomiting 03/25/2021   Bacteremia due to Escherichia coli 03/25/2021   UTI (urinary tract infection) 03/25/2021   Down syndrome 03/25/2021   AKI (acute kidney injury) (HCC) 03/24/2021   Septic shock (HCC)    Ureteral calculus    Seizures (HCC) 12/03/2018   Obesity, Class III, 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   Chronic diastolic CHF (congestive heart failure) (HCC) 11/06/2018   Acute metabolic encephalopathy 11/06/2018   Hypothyroid 11/06/2018   COVID-19 virus infection 11/03/2018   COVID-19    Acute respiratory failure with hypoxia (HCC) 11/02/2018   Left tibial fracture 04/27/2017   Right tibial fracture 04/27/2017   Problems with swallowing and mastication    Blood in stool    Bradycardia 05/14/2015   Mixed Alzheimer's and vascular  dementia (HCC) 09/14/2014   Onychomycosis     Orientation RESPIRATION BLADDER Height & Weight     Self  Normal External catheter, Incontinent Weight: 75.1 kg Height:  5' (152.4 cm)  BEHAVIORAL SYMPTOMS/MOOD NEUROLOGICAL BOWEL NUTRITION STATUS  Other (Comment) (n/a) Convulsions/Seizures Incontinent Diet (DYS 1)  AMBULATORY STATUS COMMUNICATION OF NEEDS Skin   Total Care Verbally Other (Comment)                       Personal Care Assistance Level of Assistance  Bathing, Feeding, Dressing Bathing Assistance: Maximum assistance Feeding assistance: Maximum assistance Dressing Assistance: Maximum assistance     Functional Limitations Info  Sight, Hearing, Speech Sight Info: Adequate Hearing Info: Adequate Speech Info: Adequate    SPECIAL CARE FACTORS FREQUENCY                       Contractures Contractures Info: Not present    Additional Factors Info  Code Status, Allergies Code Status Info: FULL Allergies Info: No Known Allergies           Current Medications (02/21/2023):  This is the current hospital active medication list Current Facility-Administered Medications  Medication Dose Route Frequency Provider Last Rate Last Admin   Chlorhexidine  Gluconate Cloth 2 % PADS 6 each  6 each Topical Daily Jesus America, NP   6 each at 02/20/23 1014   enoxaparin  (LOVENOX ) injection 40 mg  40 mg Subcutaneous Q24H Eldonna Elspeth PARAS, MD   40 mg at 02/20/23 2151   levETIRAcetam  (KEPPRA ) IVPB 1000 mg/100 mL premix  1,000 mg Intravenous Q12H Michaela Aisha SQUIBB, MD 400 mL/hr at 02/21/23 1035 1,000 mg at 02/21/23 1035   LORazepam  (ATIVAN ) injection 0.5 mg  0.5 mg Intravenous Q2H PRN Newton, Steven J, MD       midodrine  (PROAMATINE ) tablet 10 mg  10 mg Oral TID WC Djan, Prince T, MD   10 mg at 02/21/23 1031   mupirocin  ointment (BACTROBAN ) 2 % 1 Application  1 Application Nasal BID Jesus America, NP   1 Application at 02/21/23 1031   ondansetron  (ZOFRAN ) tablet 4 mg   4 mg Oral Q6H PRN Newton, Steven J, MD       Or   ondansetron  (ZOFRAN ) injection 4 mg  4 mg Intravenous Q6H PRN Newton, Steven J, MD       Oral care mouth rinse  15 mL Mouth Rinse Q2H Eldonna Elspeth PARAS, MD   15 mL at 02/21/23 1038   Oral care mouth rinse  15 mL Mouth Rinse PRN Eldonna Elspeth PARAS, MD         Discharge Medications: Please see discharge summary for a list of discharge medications.  Relevant Imaging Results:  Relevant Lab Results:   Additional Information SSN:[id number]  Laporsche Hoeger C Emonii Wienke, RN

## 2023-02-21 NOTE — TOC Transition Note (Addendum)
 Transition of Care [facility]) - Discharge Note   Patient Details  Name: Lori Petersen [facility] MRN: [id number] Date of Birth: 10/19/1955  Transition of Care [facility]) CM/SW Contact:  Keland Peyton C Acquanetta Cabanilla, RN Phone Number: 02/21/2023, 1:42 PM   Clinical Narrative:   [facility] RN notified patient was medically ready for discharge.    Spoke with Tammy in admissions at [facility] Per facility patient admission confirmed for today. Patient assigned room # 203-A Nurse will call report to [phone] Face sheet and medical necessity forms printed to the floor to be added to the EMS pack EMS arranged 2nd on the list.  Discharge summary and SNF transfer report sent in HUB.  Attempt to contact patient's sister, Naomie,  @ [phone] or [phone]. Per [facility] RN, patient's sister is deceased. They are working to get a legal guardian for her.  TOC signing off.          Patient Goals and CMS Choice            Discharge Placement                       Discharge Plan and Services Additional resources added to the After Visit Summary for                                       Social Drivers of Health (SDOH) Interventions SDOH Screenings   Food Insecurity: Patient Unable To Answer (02/18/2023)  Housing: Patient Unable To Answer (02/18/2023)  Transportation Needs: Patient Unable To Answer (02/18/2023)  Utilities: Patient Unable To Answer (12/05/2022)  Social Connections: Unknown (02/18/2023)  Tobacco Use: Low Risk  (01/03/2023)     Readmission Risk Interventions    06/29/2021   11:13 AM 06/27/2021   11:00 AM  Readmission Risk Prevention Plan  Transportation Screening Complete Complete  PCP or Specialist Appt within 3-5 Days Complete   HRI or Home Care Consult Complete   Social Work Consult for Recovery Care Planning/Counseling Complete   Palliative Care Screening Complete   Medication Review Oceanographer) Complete Complete  PCP or Specialist appointment within 3-5 days of  discharge  Complete  HRI or Home Care Consult  Complete  SW Recovery Care/Counseling Consult  Not Complete  SW Consult Not Complete Comments  NA  Palliative Care Screening  Complete  Skilled Nursing Facility  Complete

## 2023-02-21 NOTE — Discharge Summary (Signed)
 Physician Discharge Summary   Patient: Lori Petersen [facility] MRN: [id number]  DOB: 1955/11/13   Admit:     Date of Admission: 02/16/2023 Admitted from: SNF / long term care    Discharge: Date of discharge: 02/21/23 Disposition: SNF / long term care  Condition at discharge: good  CODE STATUS: FULL CODE     Discharge Physician: Laneta Blunt, DO [facility]     PCP: Tobie Domino, MD  Recommendations for Outpatient Follow-up:  Follow up with PCP Tobie Domino, MD in 1-2 weeks Please obtain labs/tests: BMP, CBC in 2-3 days   Discharge Instructions     Diet - low sodium heart healthy   Complete by: As directed    Discharge wound care:   Complete by: As directed    02/21/23 0500    Wound care  Daily      Comments: 1.        Cleanse sacral wound with Vashe wound cleanser Lori [phone]), using a Q tip applicator insert Vashe moistened gauze into wound bed, cover with dry gauze and silicone foam.   2.         Apply silicone foam to L ischium Stage 2, lift foam daily to assess., Re-consult WOC team if develops any necrotic tissue (brown/yellow/tan/black).  3.Apply silicone foam to R heel, maintain free floating position utilizing pillows to offload pressure.   Increase activity slowly   Complete by: As directed          Discharge Diagnoses: Principal Problem:   Breakthrough seizure (HCC) Active Problems:   UTI (urinary tract infection)   Acute encephalopathy   Chronic diastolic CHF (congestive heart failure) (HCC)   Hypernatremia   Asthma, mild intermittent   Hypothyroid       Hospital Course: Hospital course / significant events:  Lori Petersen [facility] is a 68 y.o. female with medical history significant of seizure, HLD, pre-DM, asthma, dCHF, hypothyroidism, GERD, Down syndrome, dementia presenting with recurrent seizure, encephalopathy, UTI.  Limited history in setting of encephalopathy. Resident of [facility]. Had seizure event around a feeding morning of  presentation.  Also with seizure night prior.  Had return to baseline after episodes. Morning of presentation,  EMS was called d/t patient with persisting lethargy postictal.  No reported fevers or chills.  No reported nausea or vomiting.  No reported head traumas. Noted admission 11/2022 for similar issues. 01/31: admitted to hospitalist for recurrent/breakthrough seizure, neurology consult, UTI, hypernatremia. Improving, corrected electrolyte imbalances, stable for discharge 02/21/23 see A/P for problem based course      Consultants:  Neurology   Procedures/Surgeries: none      ASSESSMENT & PLAN:   Breakthrough seizure  Recurrent seizure events in setting of baseline seizure disorder noted at living facility as well as in the ED  CT scan of the brain did not show any acute pathology Continue Keppra  100 mg bid (vs prior dose 750 mg bid) Noted excessive sedation w/ valium  (5mg ) Neurologist have signed off    Hyperkalemia - resolved Patient received a dose of Lokelma  yesterday Monitor potassium    Hypotension-chronic According [facility] she runs chronically low Midodrine     UTI (urinary tract infection) ruled out Urinalysis indicative of infection but urine culture showing no growth to date Has completed 5 days course of antibiotics     Acute metabolic encephalopathy - improved Currently back to baseline CT scan of the brain did not show any acute intracranial pathology Monitor neurochecks closely Was  discussed with [facility]'s physician at [facility]     Hypernatremia - resolved  Na 151 on presentation  Suspect secondary to seizure event-likely transient  Patient received IV fluid Monitor sodium level closely   Chronic diastolic CHF (congestive heart failure) (HCC) 2D ECHO 03/2021 w/ EF 55-60%  Appears euvolemic  Monitor input and output   Asthma, mild intermittent Stable from a resp standpoint Continue inhalers as needed   Hypothyroid Continue  Synthroid    Pressure ulcers Wound type: 1.  Stage 3 Pressure Injury sacrum, Stage 2 Pressure Injury L ischium  2. Stage 1 Pressure Injury R lateral heel  Pressure Injury POA: Yes Measurement: 1.  Stage 3 Sacrum 3 cm x 3 cm x 1 cm 100% red moist, no odor  2.  L ischium Stage 2 PI 1 cm x 1 cm x 0.1 cm 100% pink moist  3.  R lateral heel Stage 1 PI 1 cm x 1 cm Wound care on board we appreciate input    Class 1 obesity based on BMI: Body mass index is 32.33 kg/m.  Underweight - under 18  overweight - 25 to 29 obese - 30 or more Class 1 obesity: BMI of 30.0 to 34 Class 2 obesity: BMI of 35.0 to 39 Class 3 obesity: BMI of 40.0 to 49 Super Morbid Obesity: BMI 50-59 Super-super Morbid Obesity: BMI 60+ Significantly low or high BMI is associated with higher medical risk.  Weight management advised as adjunct to other disease management and risk reduction treatments             Discharge Instructions  Allergies as of 02/21/2023   No Known Allergies      Medication List     TAKE these medications    albuterol  108 (90 Base) MCG/ACT inhaler Commonly known as: VENTOLIN  HFA Inhale 2 puffs into the lungs every 4 (four) hours as needed.   ascorbic acid  500 MG tablet Commonly known as: VITAMIN C Take 500 mg by mouth 2 (two) times daily.   aspirin  EC 81 MG tablet Take 81 mg by mouth daily.   budesonide  0.25 MG/2ML nebulizer solution Commonly known as: PULMICORT  Take 0.25 mg by nebulization 2 (two) times daily.   Combigan  0.2-0.5 % ophthalmic solution Generic drug: brimonidine -timolol  Place 1 drop into both eyes in the morning and at bedtime.   cyanocobalamin  1000 MCG tablet Commonly known as: VITAMIN B12 Take 1,000 mcg by mouth daily.   DIMETHICONE-ZINC  OXIDE-VIT A-D EX Apply 1 Tube topically 3 (three) times daily. Apply to Gluteal areas during toileting.   feeding supplement (PRO-STAT SUGAR FREE 64) Liqd Take 30 mLs by mouth 2 (two) times daily.    levETIRAcetam  1000 MG tablet Commonly known as: KEPPRA  Take 1 tablet (1,000 mg total) by mouth 2 (two) times daily. (Take with 250mg  tablet to equal 750mg  total) What changed:  medication strength how much to take Another medication with the same name was removed. Continue taking this medication, and follow the directions you see here.   levothyroxine  100 MCG tablet Commonly known as: SYNTHROID  Take 100 mcg by mouth daily before breakfast.   midodrine  10 MG tablet Commonly known as: PROAMATINE  Take 1 tablet (10 mg total) by mouth 3 (three) times daily with meals.   omeprazole 20 MG capsule Commonly known as: PRILOSEC Take 20 mg by mouth daily.   Perforomist  20 MCG/2ML nebulizer solution Generic drug: formoterol  Take 20 mcg by nebulization 2 (two) times daily.   pravastatin  40 MG tablet Commonly known as: PRAVACHOL   Take 40 mg by mouth at bedtime.   prenatal vitamin w/FE, FA 27-1 MG Tabs tablet Take 1 tablet by mouth daily.               Discharge Care Instructions  (From admission, onward)           Start     Ordered   02/21/23 0000  Discharge wound care:       Comments: 02/21/23 0500    Wound care  Daily      Comments: 1.        Cleanse sacral wound with Vashe wound cleanser Lori Petersen), using a Q tip applicator insert Vashe moistened gauze into wound bed, cover with dry gauze and silicone foam.   2.         Apply silicone foam to L ischium Stage 2, lift foam daily to assess., Re-consult WOC team if develops any necrotic tissue (brown/yellow/tan/black).  3.Apply silicone foam to R heel, maintain free floating position utilizing pillows to offload pressure.   02/21/23 1255             Follow-up Information     Schedule an appointment as soon as possible for a visit  with Tobie Domino, MD.   Specialty: Family Medicine Contact information: [address] [place] [address] [phone]                 No Known  Allergies   Subjective: pt unable to contribute. She is alert but doesn't verbalize answers. Aide from [facility] is at bedside feeding pt and states paient sems back to her baseline.    Discharge Exam: BP (!) 90/51 (BP Location: Left Arm)   Pulse 79   Temp 98.6 F (37 C)   Resp 18   Ht 5' (1.524 m)   Wt 75.1 kg   SpO2 100%   BMI 32.33 kg/m  General: Pt is alert, awake, not in acute distress Cardiovascular: RRR, S1/S2 +, no rubs, no gallops Respiratory: CTA bilaterally, no wheezing, no rhonchi Abdominal: Soft, NT, ND, bowel sounds + Extremities: no edema, no cyanosis     The results of significant diagnostics from this hospitalization (including imaging, microbiology, ancillary and laboratory) are listed below for reference.     Microbiology: Recent Results (from the past 240 hours)  Resp panel by RT-PCR (RSV, Flu A&B, Covid) Anterior Nasal Swab     Status: None   Collection Time: 02/16/23 11:29 AM   Specimen: Anterior Nasal Swab  Result Value Ref Range Status   SARS Coronavirus 2 by RT PCR NEGATIVE NEGATIVE Final    Comment: (NOTE) SARS-CoV-2 target nucleic acids are NOT DETECTED.  The SARS-CoV-2 RNA is generally detectable in upper respiratory specimens during the acute phase of infection. The lowest concentration of SARS-CoV-2 viral copies this assay can detect is 138 copies/mL. A negative result does not preclude SARS-Cov-2 infection and should not be used as the sole basis for treatment or other patient management decisions. A negative result may occur with  improper specimen collection/handling, submission of specimen other than nasopharyngeal swab, presence of viral mutation(s) within the areas targeted by this assay, and inadequate number of viral copies(<138 copies/mL). A negative result must be combined with clinical observations, patient history, and epidemiological information. The expected result is Negative.  Fact Sheet for Patients:   bloggercourse.com  Fact Sheet for Healthcare Providers:  seriousbroker.it  This test is no t yet approved or cleared by the [place]  FDA and  has  been authorized for detection and/or diagnosis of SARS-CoV-2 by FDA under an Emergency Use Authorization (EUA). This EUA will remain  in effect (meaning this test can be used) for the duration of the COVID-19 declaration under Section 564(b)(1) of the Act, 21 U.S.C.section 360bbb-3(b)(1), unless the authorization is terminated  or revoked sooner.       Influenza A by PCR NEGATIVE NEGATIVE Final   Influenza B by PCR NEGATIVE NEGATIVE Final    Comment: (NOTE) The Xpert Xpress SARS-CoV-2/FLU/RSV plus assay is intended as an aid in the diagnosis of influenza from Nasopharyngeal swab specimens and should not be used as a sole basis for treatment. Nasal washings and aspirates are unacceptable for Xpert Xpress SARS-CoV-2/FLU/RSV testing.  Fact Sheet for Patients: bloggercourse.com  Fact Sheet for Healthcare Providers: seriousbroker.it  This test is not yet approved or cleared by the [place]  FDA and has been authorized for detection and/or diagnosis of SARS-CoV-2 by FDA under an Emergency Use Authorization (EUA). This EUA will remain in effect (meaning this test can be used) for the duration of the COVID-19 declaration under Section 564(b)(1) of the Act, 21 U.S.C. section 360bbb-3(b)(1), unless the authorization is terminated or revoked.     Resp Syncytial Virus by PCR NEGATIVE NEGATIVE Final    Comment: (NOTE) Fact Sheet for Patients: bloggercourse.com  Fact Sheet for Healthcare Providers: seriousbroker.it  This test is not yet approved or cleared by the [place]  FDA and has been authorized for detection and/or diagnosis of SARS-CoV-2 by FDA under an Emergency Use  Authorization (EUA). This EUA will remain in effect (meaning this test can be used) for the duration of the COVID-19 declaration under Section 564(b)(1) of the Act, 21 U.S.C. section 360bbb-3(b)(1), unless the authorization is terminated or revoked.  Performed at [facility], [address] [place], [place] [address]   MRSA Next Gen by PCR, Nasal     Status: Abnormal   Collection Time: 02/18/23  2:13 AM   Specimen: Nasal Mucosa; Nasal Swab  Result Value Ref Range Status   MRSA by PCR Next Gen DETECTED (A) NOT DETECTED Final    Comment: CRITICAL RESULT CALLED TO, READ BACK BY AND VERIFIED WITH: CHARLOTTE KYEI @ 02/18/23 0353 BY AB (NOTE) The GeneXpert MRSA Assay (FDA approved for NASAL specimens only), is one component of a comprehensive MRSA colonization surveillance program. It is not intended to diagnose MRSA infection nor to guide or monitor treatment for MRSA infections. Test performance is not FDA approved in patients less than 6 years old. Performed at [facility], [address], [place], [place] [address]   Urine Culture (for pregnant, neutropenic or urologic patients or patients with an indwelling urinary catheter)     Status: None   Collection Time: 02/18/23  3:24 PM   Specimen: Urine, Clean Catch  Result Value Ref Range Status   Specimen Description   Final    URINE, CLEAN CATCH Performed at [facility], [address], [place], [place] [address]    Special Requests   Final    NONE Performed at [facility], [address], [place], [place] [address]    Culture   Final    NO GROWTH Performed at [facility], [address], [place], [place] [address]    Report Status 02/20/2023 FINAL  Final     Labs: BNP (last 3 results) Recent Labs    12/01/22 1553  BNP 16.1   Basic Metabolic Panel: Recent Labs  Lab  02/16/23 2005 02/16/23 2114 02/17/23 0042 02/17/23 2048 02/18/23 0013 02/19/23 0409 02/20/23 0755  02/21/23 0705  NA 149* 150*   < > 140 141 141 142 141  K 3.7  --   --   --  2.9* 4.1 5.3* 3.9  CL 111  --   --   --  108 111 110 107  CO2 28  --   --   --  23 25 23 27   GLUCOSE 89  --   --   --  100* 106* 74 85  BUN 15  --   --   --  12 8 15 15   CREATININE 0.96  --   --   --  0.87 0.78 0.82 0.99  CALCIUM 8.5*  --   --   --  8.3* 7.8* 7.8* 8.1*  MG 2.1 2.1  --   --   --   --   --   --    < > = values in this interval not displayed.   Liver Function Tests: Recent Labs  Lab 02/16/23 1129  AST 26  ALT 25  ALKPHOS 43  BILITOT 0.4  PROT 7.6  ALBUMIN  3.4*   No results for input(s): LIPASE, AMYLASE in the last 168 hours. No results for input(s): AMMONIA in the last 168 hours. CBC: Recent Labs  Lab 02/16/23 1129 02/18/23 0013 02/19/23 0409 02/20/23 0755 02/21/23 0705  WBC 7.1 6.6 5.7 7.0 6.6  NEUTROABS 4.2 3.6 2.7 3.5 4.0  HGB 11.8* 10.3* 10.3* 10.8* 10.8*  HCT 38.0 32.6* 31.8* 33.4* 33.6*  MCV 104.7* 102.8* 100.0 100.0 101.2*  PLT 219 183 173 190 183   Cardiac Enzymes: No results for input(s): CKTOTAL, CKMB, CKMBINDEX, TROPONINI in the last 168 hours. BNP: Invalid input(s): POCBNP CBG: Recent Labs  Lab 02/20/23 1640 02/20/23 2053 02/20/23 2332 02/21/23 0413 02/21/23 1144  GLUCAP 92 115* 109* 76 83   D-Dimer No results for input(s): DDIMER in the last 72 hours. Hgb A1c No results for input(s): HGBA1C in the last 72 hours. Lipid Profile No results for input(s): CHOL, HDL, LDLCALC, TRIG, CHOLHDL, LDLDIRECT in the last 72 hours. Thyroid  function studies No results for input(s): TSH, T4TOTAL, T3FREE, THYROIDAB in the last 72 hours.  Invalid input(s): FREET3 Anemia work up No results for input(s): VITAMINB12, FOLATE, FERRITIN, TIBC, IRON, RETICCTPCT in the last 72 hours. Urinalysis    Component Value Date/Time   COLORURINE RED (A) 02/18/2023 1524   APPEARANCEUR HAZY (A) 02/18/2023 1524   APPEARANCEUR CLEAR  02/22/2013 2300   LABSPEC 1.006 02/18/2023 1524   LABSPEC 1.013 02/22/2013 2300   PHURINE 5.0 02/18/2023 1524   GLUCOSEU NEGATIVE 02/18/2023 1524   GLUCOSEU NEGATIVE 02/22/2013 2300   HGBUR LARGE (A) 02/18/2023 1524   BILIRUBINUR NEGATIVE 02/18/2023 1524   BILIRUBINUR NEGATIVE 02/22/2013 2300   KETONESUR NEGATIVE 02/18/2023 1524   PROTEINUR 100 (A) 02/18/2023 1524   NITRITE NEGATIVE 02/18/2023 1524   LEUKOCYTESUR MODERATE (A) 02/18/2023 1524   LEUKOCYTESUR NEGATIVE 02/22/2013 2300   Sepsis Labs Recent Labs  Lab 02/18/23 0013 02/19/23 0409 02/20/23 0755 02/21/23 0705  WBC 6.6 5.7 7.0 6.6   Microbiology Recent Results (from the past 240 hours)  Resp panel by RT-PCR (RSV, Flu A&B, Covid) Anterior Nasal Swab     Status: None   Collection Time: 02/16/23 11:29 AM   Specimen: Anterior Nasal Swab  Result Value Ref Range Status   SARS Coronavirus 2 by RT PCR NEGATIVE NEGATIVE Final    Comment: (  NOTE) SARS-CoV-2 target nucleic acids are NOT DETECTED.  The SARS-CoV-2 RNA is generally detectable in upper respiratory specimens during the acute phase of infection. The lowest concentration of SARS-CoV-2 viral copies this assay can detect is 138 copies/mL. A negative result does not preclude SARS-Cov-2 infection and should not be used as the sole basis for treatment or other patient management decisions. A negative result may occur with  improper specimen collection/handling, submission of specimen other than nasopharyngeal swab, presence of viral mutation(s) within the areas targeted by this assay, and inadequate number of viral copies(<138 copies/mL). A negative result must be combined with clinical observations, patient history, and epidemiological information. The expected result is Negative.  Fact Sheet for Patients:  bloggercourse.com  Fact Sheet for Healthcare Providers:  seriousbroker.it  This test is no t yet approved or  cleared by the [place]  FDA and  has been authorized for detection and/or diagnosis of SARS-CoV-2 by FDA under an Emergency Use Authorization (EUA). This EUA will remain  in effect (meaning this test can be used) for the duration of the COVID-19 declaration under Section 564(b)(1) of the Act, 21 U.S.C.section 360bbb-3(b)(1), unless the authorization is terminated  or revoked sooner.       Influenza A by PCR NEGATIVE NEGATIVE Final   Influenza B by PCR NEGATIVE NEGATIVE Final    Comment: (NOTE) The Xpert Xpress SARS-CoV-2/FLU/RSV plus assay is intended as an aid in the diagnosis of influenza from Nasopharyngeal swab specimens and should not be used as a sole basis for treatment. Nasal washings and aspirates are unacceptable for Xpert Xpress SARS-CoV-2/FLU/RSV testing.  Fact Sheet for Patients: bloggercourse.com  Fact Sheet for Healthcare Providers: seriousbroker.it  This test is not yet approved or cleared by the [place]  FDA and has been authorized for detection and/or diagnosis of SARS-CoV-2 by FDA under an Emergency Use Authorization (EUA). This EUA will remain in effect (meaning this test can be used) for the duration of the COVID-19 declaration under Section 564(b)(1) of the Act, 21 U.S.C. section 360bbb-3(b)(1), unless the authorization is terminated or revoked.     Resp Syncytial Virus by PCR NEGATIVE NEGATIVE Final    Comment: (NOTE) Fact Sheet for Patients: bloggercourse.com  Fact Sheet for Healthcare Providers: seriousbroker.it  This test is not yet approved or cleared by the [place]  FDA and has been authorized for detection and/or diagnosis of SARS-CoV-2 by FDA under an Emergency Use Authorization (EUA). This EUA will remain in effect (meaning this test can be used) for the duration of the COVID-19 declaration under Section 564(b)(1) of the Act, 21  U.S.C. section 360bbb-3(b)(1), unless the authorization is terminated or revoked.  Performed at [facility], [address] [place], [place] [address]   MRSA Next Gen by PCR, Nasal     Status: Abnormal   Collection Time: 02/18/23  2:13 AM   Specimen: Nasal Mucosa; Nasal Swab  Result Value Ref Range Status   MRSA by PCR Next Gen DETECTED (A) NOT DETECTED Final    Comment: CRITICAL RESULT CALLED TO, READ BACK BY AND VERIFIED WITH: CHARLOTTE KYEI @ 02/18/23 0353 BY AB (NOTE) The GeneXpert MRSA Assay (FDA approved for NASAL specimens only), is one component of a comprehensive MRSA colonization surveillance program. It is not intended to diagnose MRSA infection nor to guide or monitor treatment for MRSA infections. Test performance is not FDA approved in patients less than 82 years old. Performed at [facility], [address] [place], [place] [address]   Urine  Culture (for pregnant, neutropenic or urologic patients or patients with an indwelling urinary catheter)     Status: None   Collection Time: 02/18/23  3:24 PM   Specimen: Urine, Clean Catch  Result Value Ref Range Status   Specimen Description   Final    URINE, CLEAN CATCH Performed at [facility], [address] [place], [place] [address]    Special Requests   Final    NONE Performed at [facility], [address] [place], [place] [address]    Culture   Final    NO GROWTH Performed at [facility], [address], [place], [place] [address]    Report Status 02/20/2023 FINAL  Final   Imaging CT Head Wo Contrast Result Date: 02/16/2023 CLINICAL DATA:  Mental status change, unknown cause EXAM: CT HEAD WITHOUT CONTRAST TECHNIQUE: Contiguous axial images were obtained from the base of the skull through the vertex without intravenous contrast. RADIATION DOSE REDUCTION: This exam was performed according to the departmental dose-optimization program which includes automated  exposure control, adjustment of the mA and/or kV according to patient size and/or use of iterative reconstruction technique. COMPARISON:  12/01/2022 FINDINGS: Brain: No evidence of acute infarction, hemorrhage, hydrocephalus, extra-axial collection or mass lesion/mass effect. Patchy low-density changes within the periventricular and subcortical white matter most compatible with chronic microvascular ischemic change. Moderate diffuse cerebral volume loss. Vascular: Atherosclerotic calcifications involving the large vessels of the skull base. No unexpected hyperdense vessel. Skull: Normal. Negative for fracture or focal lesion. Sinuses/Orbits: No acute finding. Other: None. IMPRESSION: 1. No acute intracranial findings. 2. Chronic microvascular ischemic change and cerebral volume loss. Electronically Signed   By: Mabel Converse D.O.   On: 02/16/2023 15:30   DG Chest Portable 1 View Result Date: 02/16/2023 CLINICAL DATA:  Seizures and altered mental status. EXAM: PORTABLE CHEST 1 VIEW COMPARISON:  Chest radiograph dated December 02, 2022. FINDINGS: Low lung volumes. The heart size and mediastinal contours are unchanged. No focal consolidation, sizeable pleural effusion, or pneumothorax. Severe degenerative changes of the bilateral glenohumeral joints. No acute osseous abnormality. IMPRESSION: Low lung volumes.  No acute cardiopulmonary findings. Electronically Signed   By: Harrietta Sherry M.D.   On: 02/16/2023 11:34      Time coordinating discharge: over 30 minutes  SIGNED:  Sol Englert DO [facility]

## 2023-02-21 NOTE — Hospital Course (Signed)
 Hospital course / significant events:   HPI: Lori Petersen is a 68 y.o. female with medical history significant of seizure, HLD, pre-DM, asthma, dCHF, hypothyroidism, GERD, Down syndrome, dementia presenting with recurrent seizure, encephalopathy, UTI.  Limited history in setting of encephalopathy. Resident of [facility]. Had seizure event around a feeding morning of presentation.  Also with seizure night prior.  Had return to baseline after episodes. Morning of presentation,  EMS was called d/t patient with persisting lethargy postictal.  No reported fevers or chills.  No reported nausea or vomiting.  No reported head traumas. Noted admission 11/2022 for similar issues.   01/31: admitted to hospitalist for recurrent/breakthrough seizure, neurology consult, UTI, hypernatremia      Consultants:  Neurology   Procedures/Surgeries: ***      ASSESSMENT & PLAN:   Breakthrough seizure  Recurrent seizure events in setting of baseline seizure disorder noted at living facility as well as in the ED  CT scan of the brain did not show any acute pathology Continue Keppra  *** (vs prior dose ***) Noted excessive sedation w/ valium  (5mg ) As needed Ativan  Neurologist have signed off    Hyperkalemia Patient received a dose of Lokelma  yesterday Monitor potassium closely   Hypotension-chronic According to patient's physician at [facility] she runs chronically low   UTI (urinary tract infection) Urinalysis indicative of infection  Has completed 5 days course of antibiotics urine culture showing no growth to date    Acute metabolic encephalopathy - improved Currently back to baseline CT scan of the brain did not show any acute intracranial pathology Monitor neurochecks closely Was discussed with patient's physician at [facility]     Hypernatremia - resolved  Na 151 on presentation  Suspect secondary to seizure event-likely transient  Patient received IV fluid Monitor sodium level closely    Chronic diastolic CHF (congestive heart failure) (HCC) 2D ECHO 03/2021 w/ EF 55-60%  Appears euvolemic  Monitor input and output   Asthma, mild intermittent Stable from a resp standpoint Continue inhalers as needed   Hypothyroid Continue Synthroid    Pressure ulcers Wound type: 1.  Stage 3 Pressure Injury sacrum, Stage 2 Pressure Injury L ischium  2. Stage 1 Pressure Injury R lateral heel  Pressure Injury POA: Yes Measurement: 1.  Stage 3 Sacrum 3 cm x 3 cm x 1 cm 100% red moist, no odor  2.  L ischium Stage 2 PI 1 cm x 1 cm x 0.1 cm 100% pink moist  3.  R lateral heel Stage 1 PI 1 cm x 1 cm Wound care on board we appreciate input    *** based on BMI: Body mass index is 32.33 kg/m.  Underweight - under 18  overweight - 25 to 29 obese - 30 or more Class 1 obesity: BMI of 30.0 to 34 Class 2 obesity: BMI of 35.0 to 39 Class 3 obesity: BMI of 40.0 to 49 Super Morbid Obesity: BMI 50-59 Super-super Morbid Obesity: BMI 60+ Significantly low or high BMI is associated with higher medical risk.  Weight management advised as adjunct to other disease management and risk reduction treatments    DVT prophylaxis: *** IV fluids: *** continuous IV fluids  Nutrition: *** Central lines / invasive devices: ***  Code Status: *** ACP documentation reviewed: *** none on file in [facility]  TOC needs: *** Barriers to dispo / significant pending items: ***

## 2023-02-21 NOTE — Progress Notes (Signed)
 Attempted to call report to [facility]. Was placed on hold with no response. AVS printed and given to EMS.

## 2023-04-30 IMAGING — DX DG CHEST 1V PORT
1 series · 1 of 1 positions shown · non-contrast
Comparison: 03/24/2021

CLINICAL DATA: Hypoxia.

EXAM:
PORTABLE CHEST 1 VIEW

[chest ap]
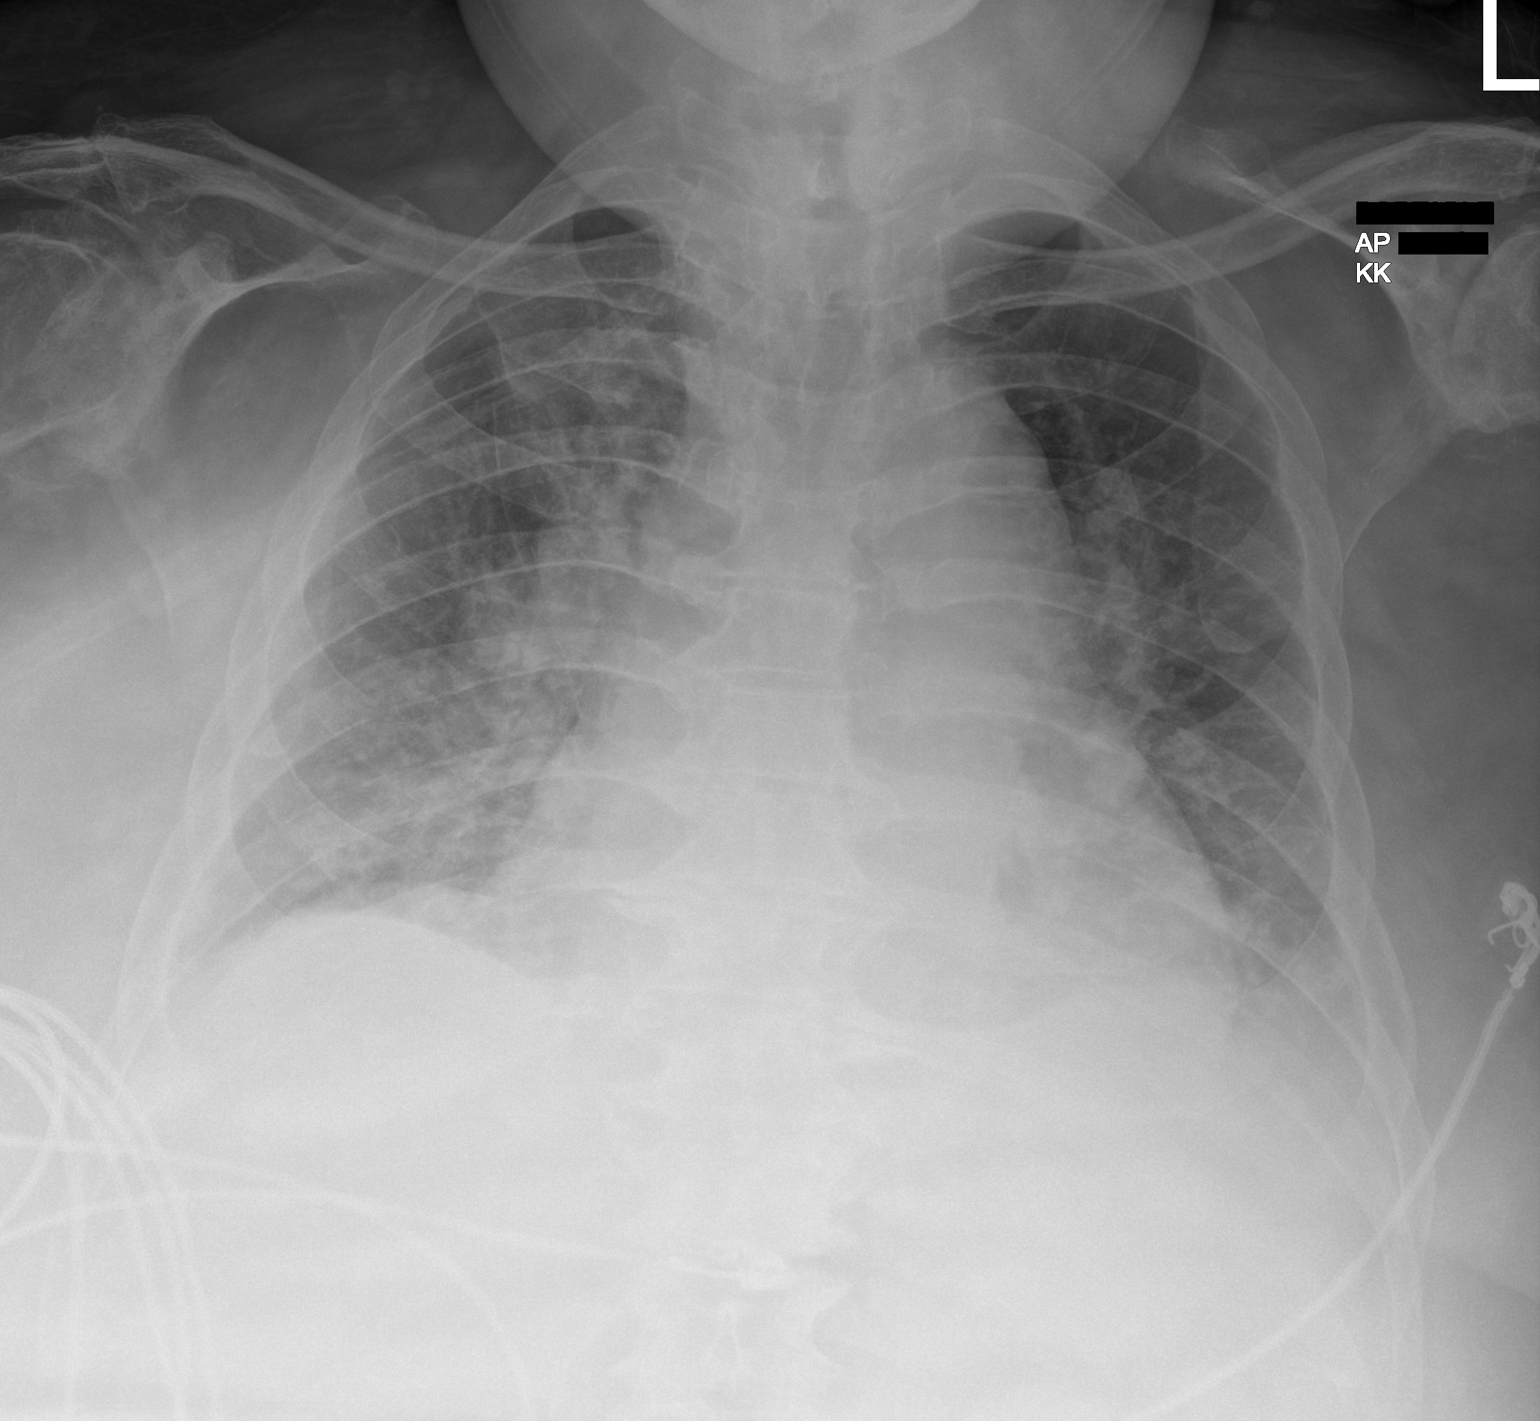

[1 of 1 positions shown; findings below may reference images not displayed]

FINDINGS: 0550 hours. Low volumes. The cardio pericardial silhouette is
enlarged. There is pulmonary vascular congestion without overt
pulmonary edema. Bibasilar atelectasis or infiltrate minimally
progressed in the interval. Underlying edema not excluded. No
substantial pleural effusion.
IMPRESSION: Low volume film with bibasilar atelectasis or infiltrate. Underlying
edema not excluded.

## 2023-08-31 ENCOUNTER — Emergency Department

## 2023-08-31 ENCOUNTER — Other Ambulatory Visit: Payer: Self-pay

## 2023-08-31 ENCOUNTER — Observation Stay
Admission: EM | Admit: 2023-08-31 | Discharge: 2023-09-01 | Disposition: A | Discharge status: Skilled Nursing Facility | Attending: Obstetrics and Gynecology | Admitting: Obstetrics and Gynecology

## 2023-08-31 DIAGNOSIS — Q909 Down syndrome, unspecified: Secondary | ICD-10-CM | POA: Insufficient documentation

## 2023-08-31 DIAGNOSIS — E669 Obesity, unspecified: Secondary | ICD-10-CM | POA: Insufficient documentation

## 2023-08-31 DIAGNOSIS — Z7401 Bed confinement status: Secondary | ICD-10-CM | POA: Diagnosis not present

## 2023-08-31 DIAGNOSIS — F0390 Unspecified dementia without behavioral disturbance: Secondary | ICD-10-CM | POA: Diagnosis not present

## 2023-08-31 DIAGNOSIS — Z7982 Long term (current) use of aspirin: Secondary | ICD-10-CM | POA: Insufficient documentation

## 2023-08-31 DIAGNOSIS — Z79899 Other long term (current) drug therapy: Secondary | ICD-10-CM | POA: Diagnosis not present

## 2023-08-31 DIAGNOSIS — E039 Hypothyroidism, unspecified: Secondary | ICD-10-CM | POA: Diagnosis not present

## 2023-08-31 DIAGNOSIS — I5032 Chronic diastolic (congestive) heart failure: Secondary | ICD-10-CM | POA: Diagnosis present

## 2023-08-31 DIAGNOSIS — E785 Hyperlipidemia, unspecified: Secondary | ICD-10-CM | POA: Diagnosis not present

## 2023-08-31 DIAGNOSIS — G40901 Epilepsy, unspecified, not intractable, with status epilepticus: Secondary | ICD-10-CM | POA: Diagnosis present

## 2023-08-31 DIAGNOSIS — J4520 Mild intermittent asthma, uncomplicated: Secondary | ICD-10-CM | POA: Diagnosis not present

## 2023-08-31 DIAGNOSIS — L8990 Pressure ulcer of unspecified site, unspecified stage: Secondary | ICD-10-CM | POA: Insufficient documentation

## 2023-08-31 DIAGNOSIS — G40919 Epilepsy, unspecified, intractable, without status epilepticus: Secondary | ICD-10-CM | POA: Diagnosis present

## 2023-08-31 DIAGNOSIS — R569 Unspecified convulsions: Principal | ICD-10-CM

## 2023-08-31 LAB — GLUCOSE, CAPILLARY: Glucose-Capillary: 99 mg/dL (ref 70–99)

## 2023-08-31 LAB — URINALYSIS, ROUTINE W REFLEX MICROSCOPIC
Bilirubin Urine: NEGATIVE
Glucose, UA: NEGATIVE mg/dL
Hgb urine dipstick: NEGATIVE
Ketones, ur: NEGATIVE mg/dL
Leukocytes,Ua: NEGATIVE
Nitrite: NEGATIVE
Protein, ur: NEGATIVE mg/dL
Specific Gravity, Urine: 1.016 (ref 1.005–1.030)
pH: 7 (ref 5.0–8.0)

## 2023-08-31 LAB — CBC WITH DIFFERENTIAL/PLATELET
Abs Immature Granulocytes: 0.07 K/uL (ref 0.00–0.07)
Basophils Absolute: 0.1 K/uL (ref 0.0–0.1)
Basophils Relative: 1 %
Eosinophils Absolute: 0 K/uL (ref 0.0–0.5)
Eosinophils Relative: 0 %
HCT: 35.7 % — ABNORMAL LOW (ref 36.0–46.0)
Hemoglobin: 11.5 g/dL — ABNORMAL LOW (ref 12.0–15.0)
Immature Granulocytes: 1 %
Lymphocytes Relative: 23 %
Lymphs Abs: 1.4 K/uL (ref 0.7–4.0)
MCH: 33.8 pg (ref 26.0–34.0)
MCHC: 32.2 g/dL (ref 30.0–36.0)
MCV: 105 fL — ABNORMAL HIGH (ref 80.0–100.0)
Monocytes Absolute: 0.3 K/uL (ref 0.1–1.0)
Monocytes Relative: 4 %
Neutro Abs: 4.1 K/uL (ref 1.7–7.7)
Neutrophils Relative %: 71 %
Platelets: 226 K/uL (ref 150–400)
RBC: 3.4 MIL/uL — ABNORMAL LOW (ref 3.87–5.11)
RDW: 14.1 % (ref 11.5–15.5)
WBC: 5.9 K/uL (ref 4.0–10.5)
nRBC: 0 % (ref 0.0–0.2)

## 2023-08-31 LAB — COMPREHENSIVE METABOLIC PANEL WITH GFR
ALT: 18 U/L (ref 0–44)
AST: 24 U/L (ref 15–41)
Albumin: 3.2 g/dL — ABNORMAL LOW (ref 3.5–5.0)
Alkaline Phosphatase: 43 U/L (ref 38–126)
Anion gap: 9 (ref 5–15)
BUN: 21 mg/dL (ref 8–23)
CO2: 26 mmol/L (ref 22–32)
Calcium: 8.6 mg/dL — ABNORMAL LOW (ref 8.9–10.3)
Chloride: 107 mmol/L (ref 98–111)
Creatinine, Ser: 0.94 mg/dL (ref 0.44–1.00)
GFR, Estimated: >60 mL/min (ref >60)
Glucose, Bld: 107 mg/dL — ABNORMAL HIGH (ref 70–99)
Potassium: 3.9 mmol/L (ref 3.5–5.1)
Sodium: 142 mmol/L (ref 135–145)
Total Bilirubin: <0.2 mg/dL (ref 0.0–1.2)
Total Protein: 6.7 g/dL (ref 6.5–8.1)

## 2023-08-31 LAB — RESP PANEL BY RT-PCR (RSV, FLU A&B, COVID)  RVPGX2
Influenza A by PCR: NEGATIVE
Influenza B by PCR: NEGATIVE
Resp Syncytial Virus by PCR: NEGATIVE
SARS Coronavirus 2 by RT PCR: NEGATIVE

## 2023-08-31 LAB — TROPONIN I (HIGH SENSITIVITY)
Troponin I (High Sensitivity): 19 ng/L — ABNORMAL HIGH (ref <18)
Troponin I (High Sensitivity): 15 ng/L (ref <18)

## 2023-08-31 LAB — MAGNESIUM: Magnesium: 2 mg/dL (ref 1.7–2.4)

## 2023-08-31 LAB — MRSA NEXT GEN BY PCR, NASAL: MRSA by PCR Next Gen: NOT DETECTED

## 2023-08-31 MED ORDER — ONDANSETRON HCL 4 MG PO TABS: 4.0000 mg | ORAL_TABLET | Freq: Four times a day (QID) | ORAL | Status: DC | PRN

## 2023-08-31 MED ORDER — ACETAMINOPHEN 325 MG PO TABS
650.0000 mg | ORAL_TABLET | ORAL | Status: DC | PRN
  Administered 2023-08-31: 650 mg via ORAL
  Filled 2023-08-31: qty 2

## 2023-08-31 MED ORDER — ASPIRIN 81 MG PO TBEC
81.0000 mg | DELAYED_RELEASE_TABLET | Freq: Every day | ORAL | Status: DC
  Administered 2023-08-31 – 2023-09-01 (×2): 81 mg via ORAL
  Filled 2023-08-31 (×2): qty 1

## 2023-08-31 MED ORDER — CYANOCOBALAMIN 500 MCG PO TABS
1000.0000 ug | ORAL_TABLET | Freq: Every day | ORAL | Status: DC
  Administered 2023-08-31 – 2023-09-01 (×2): 1000 ug via ORAL
  Filled 2023-08-31 (×2): qty 2

## 2023-08-31 MED ORDER — PRENATAL PLUS 27-1 MG PO TABS
1.0000 | ORAL_TABLET | Freq: Every day | ORAL | Status: DC
  Administered 2023-09-01: 1 via ORAL
  Filled 2023-08-31: qty 1

## 2023-08-31 MED ORDER — LEVOTHYROXINE SODIUM 50 MCG PO TABS
100.0000 ug | ORAL_TABLET | Freq: Every day | ORAL | Status: DC
  Administered 2023-09-01: 100 ug via ORAL
  Filled 2023-08-31: qty 2

## 2023-08-31 MED ORDER — ORAL CARE MOUTH RINSE
15.0000 mL | OROMUCOSAL | Status: DC
  Administered 2023-08-31 – 2023-09-01 (×14): 15 mL via OROMUCOSAL
  Filled 2023-08-31 (×6): qty 15

## 2023-08-31 MED ORDER — TIMOLOL MALEATE 0.5 % OP SOLN
1.0000 [drp] | Freq: Two times a day (BID) | OPHTHALMIC | Status: DC
  Administered 2023-08-31 – 2023-09-01 (×2): 1 [drp] via OPHTHALMIC
  Filled 2023-08-31 (×2): qty 5

## 2023-08-31 MED ORDER — BRIMONIDINE TARTRATE 0.2 % OP SOLN
1.0000 [drp] | Freq: Two times a day (BID) | OPHTHALMIC | Status: DC
  Administered 2023-08-31 – 2023-09-01 (×2): 1 [drp] via OPHTHALMIC
  Filled 2023-08-31 (×2): qty 5

## 2023-08-31 MED ORDER — ENOXAPARIN SODIUM 40 MG/0.4ML IJ SOSY
40.0000 mg | PREFILLED_SYRINGE | INTRAMUSCULAR | Status: DC
  Administered 2023-08-31: 40 mg via SUBCUTANEOUS
  Filled 2023-08-31: qty 0.4

## 2023-08-31 MED ORDER — LEVETIRACETAM 100 MG/ML PO SOLN
1250.0000 mg | Freq: Two times a day (BID) | ORAL | Status: DC
  Administered 2023-08-31 – 2023-09-01 (×2): 1250 mg via ORAL
  Filled 2023-08-31 (×3): qty 12.5

## 2023-08-31 MED ORDER — PANTOPRAZOLE SODIUM 40 MG PO TBEC: 40.0000 mg | DELAYED_RELEASE_TABLET | Freq: Every day | ORAL | Status: DC

## 2023-08-31 MED ORDER — ACETAMINOPHEN 650 MG RE SUPP: 650.0000 mg | RECTAL | Status: DC | PRN

## 2023-08-31 MED ORDER — POLYETHYLENE GLYCOL 3350 17 G PO PACK: 17.0000 g | PACK | Freq: Every day | ORAL | Status: DC | PRN

## 2023-08-31 MED ORDER — ORAL CARE MOUTH RINSE
15.0000 mL | OROMUCOSAL | Status: DC | PRN
  Administered 2023-08-31: 15 mL via OROMUCOSAL

## 2023-08-31 MED ORDER — PRAVASTATIN SODIUM 20 MG PO TABS
40.0000 mg | ORAL_TABLET | Freq: Every day | ORAL | Status: DC
  Administered 2023-08-31: 40 mg via ORAL
  Filled 2023-08-31: qty 2

## 2023-08-31 MED ORDER — BRIMONIDINE TARTRATE-TIMOLOL 0.2-0.5 % OP SOLN: 1.0000 [drp] | Freq: Two times a day (BID) | OPHTHALMIC | Status: DC

## 2023-08-31 MED ORDER — LACTATED RINGERS IV BOLUS
1000.0000 mL | Freq: Once | INTRAVENOUS | Status: AC
  Administered 2023-08-31: 1000 mL via INTRAVENOUS

## 2023-08-31 MED ORDER — ONDANSETRON HCL 4 MG/2ML IJ SOLN: 4.0000 mg | Freq: Four times a day (QID) | INTRAMUSCULAR | Status: DC | PRN

## 2023-08-31 MED ORDER — PANTOPRAZOLE SODIUM 40 MG IV SOLR
40.0000 mg | Freq: Every day | INTRAVENOUS | Status: DC
  Administered 2023-08-31: 40 mg via INTRAVENOUS
  Filled 2023-08-31: qty 10

## 2023-08-31 MED ORDER — LEVETIRACETAM 100 MG/ML PO SOLN
1000.0000 mg | Freq: Two times a day (BID) | ORAL | Status: DC
  Administered 2023-08-31: 1000 mg via ORAL
  Filled 2023-08-31: qty 10

## 2023-08-31 MED ORDER — MIDODRINE HCL 5 MG PO TABS
10.0000 mg | ORAL_TABLET | Freq: Three times a day (TID) | ORAL | Status: DC
  Administered 2023-08-31 – 2023-09-01 (×5): 10 mg via ORAL
  Filled 2023-08-31 (×5): qty 2

## 2023-08-31 NOTE — ED Provider Notes (Signed)
 [facility] Provider Note    Event Date/Time   First MD Initiated Contact with Patient 08/31/23 0206     (approximate)   History   Seizures   HPI  Lori Petersen is a 68 y.o. female who presents to the ED for evaluation of Seizures   I reviewed medical DC summary from February.  History of Down syndrome, dementia and seizure disorder.  Patient presents from local facility after 2 witnessed seizures.  Receives prehospital Ativan with cessation of seizure activity.  Postictal on arrival, history is limited.   Physical Exam   Triage Vital Signs: ED Triage Vitals  Encounter Vitals Group     BP      Girls Systolic BP Percentile      Girls Diastolic BP Percentile      Boys Systolic BP Percentile      Boys Diastolic BP Percentile      Pulse      Resp      Temp      Temp src      SpO2      Weight      Height      Head Circumference      [facility]      Pain Score      Pain Loc      Pain Education      Exclude from Growth Chart     Most recent vital signs: Vitals:   08/31/23 0700 08/31/23 0708  BP: (!) 121/93   Pulse:    Resp:    Temp:    SpO2:  96%    General: Somnolent, no distress.  CV:  Good peripheral perfusion.  Resp:  Normal effort.  Abd:  No distention.  MSK:  No deformity noted.  Neuro:  No focal deficits appreciated. Other:     ED Results / Procedures / Treatments   Labs (all labs ordered are listed, but only abnormal results are displayed) Labs Reviewed  COMPREHENSIVE METABOLIC PANEL WITH GFR - Abnormal; Notable for the following components:      Result Value   Glucose, Bld 107 (*)    Calcium 8.6 (*)    Albumin 3.2 (*)    All other components within normal limits  CBC WITH DIFFERENTIAL/PLATELET - Abnormal; Notable for the following components:   RBC 3.40 (*)    Hemoglobin 11.5 (*)    HCT 35.7 (*)    MCV 105.0 (*)    All other components within normal limits  URINALYSIS, ROUTINE W REFLEX MICROSCOPIC -  Abnormal; Notable for the following components:   Color, Urine YELLOW (*)    APPearance CLOUDY (*)    All other components within normal limits  TROPONIN I (HIGH SENSITIVITY) - Abnormal; Notable for the following components:   Troponin I (High Sensitivity) 19 (*)    All other components within normal limits  MAGNESIUM  TROPONIN I (HIGH SENSITIVITY)    EKG Sinus rhythm with a rate of 96 bpm.  Leftward axis, normal intervals, no for signs of acute ischemia.  RADIOLOGY CT head interpreted by me without evidence of acute intracranial pathology CXR interpreted by me without evidence of acute cardiopulmonary pathology.  Official radiology report(s): CT HEAD WO CONTRAST ( ) Result Date: 08/31/2023 CLINICAL DATA:  breakthrough seizures, prolonged postictal period EXAM: CT HEAD WITHOUT CONTRAST TECHNIQUE: Contiguous axial images were obtained from the base of the skull through the vertex without intravenous contrast. RADIATION DOSE REDUCTION: This exam was performed according to  the departmental dose-optimization program which includes automated exposure control, adjustment of the mA and/or kV according to patient size and/or use of iterative reconstruction technique. COMPARISON:  02/16/2023 FINDINGS: Brain: Normal anatomic configuration. Moderate parenchymal volume loss is stable though advanced given the patient's age. Mild periventricular white matter changes are present likely reflecting the sequela of small vessel ischemia. Prominent mesial temporal atrophy with notable dilation of the temporal horns of the lateral ventricles is stable. No abnormal intra or extra-axial mass lesion or fluid collection. No abnormal mass effect or midline shift. No evidence of acute intracranial hemorrhage or infarct. Ventricular size is mildly enlarged, commensurate with the degree of parenchymal atrophy and likely reflecting ex vacuo dilation . Cerebellum unremarkable. Vascular: No asymmetric hyperdense vasculature  at the skull base. Skull: Intact Sinuses/Orbits: Paranasal sinuses are clear. Orbits are unremarkable. Other: Mastoid air cells and middle ear cavities are clear. IMPRESSION: 1. No acute intracranial abnormality. No calvarial fracture. 2. Stable moderate parenchymal volume loss, advanced given the patient's age. 3. Stable prominent mesial temporal atrophy with notable dilation of the temporal horns of the lateral ventricles. Electronically Signed   By: Dorethia Molt M.D.   On: 08/31/2023 03:32   DG Chest Portable 1 View Result Date: 08/31/2023 CLINICAL DATA:  Seizure EXAM: PORTABLE CHEST 1 VIEW COMPARISON:  02/16/2023 FINDINGS: Lungs volumes are small, but are symmetric and are clear. No pneumothorax or pleural effusion. Cardiac size within normal limits. Pulmonary vascularity is normal. Advanced degenerative changes noted within the shoulders bilaterally. No acute bone abnormality. IMPRESSION: 1. Pulmonary hypoinflation. Electronically Signed   By: Dorethia Molt M.D.   On: 08/31/2023 03:19    PROCEDURES and INTERVENTIONS:  .1-3 Lead EKG Interpretation  Performed by: Claudene Rover, MD Authorized by: Claudene Rover, MD     Interpretation: normal     ECG rate:  80   ECG rate assessment: normal     Rhythm: sinus rhythm     Ectopy: none     Conduction: normal     Medications  lactated ringers  bolus 1,000 mL (0 mLs Intravenous Stopped 08/31/23 0417)     IMPRESSION / MDM / ASSESSMENT AND PLAN / ED COURSE  I reviewed the triage vital signs and the nursing notes.  Differential diagnosis includes, but is not limited to, status epilepticus, ICH, medication noncompliance, sepsis  {Patient presents with symptoms of an acute illness or injury that is potentially life-threatening.  Patient presents after witnessed seizure.  No recurrence of seizure activity in the ED, no evidence of status epilepticus.  Improving neurologic exam throughout this morning and seems to be returning closer to  baseline.  Reassuring blood work with normal CBC, metabolic panel and troponins.  Urine without infectious features.  Reassuring imaging.  We will ensure she returns to baseline and hopefully get her back to facility.  Signed out to oncoming physician.  Clinical Course as of 08/31/23 9257  [place] Aug 31, 2023  0621 Reassessed, looks well, certainly more awake and alert, seems to be at her baseline  [DS]    Clinical Course User Index [DS] Claudene Rover, MD     FINAL CLINICAL IMPRESSION(S) / ED DIAGNOSES   Final diagnoses:  None     Rx / DC Orders   ED Discharge Orders     None        Note:  This document was prepared using Dragon voice recognition software and may include unintentional dictation errors.   Claudene Rover, MD 08/31/23 [phone]

## 2023-08-31 NOTE — ED Notes (Signed)
 Pericare done and new brief placed.

## 2023-08-31 NOTE — Consult Note (Signed)
 WOC Nurse Consult Note: Reason for Consult: requested to assess a sacrum wound stage 4. Performed remotely valuating photo and notes. Wound type: PI stage 4 on sacrum with non viable edges. Pressure Injury POA: Yes Measurement: aprox. 2.3 cm x 1.5 cm x 0.5 cm Wound bed: 100% red Drainage (amount, consistency, odor) Moderate amount, serous. Periwound: macerate, epibole edges. Old white scar Dressing procedure/placement/frequency: Cleanse with Vashe [id number]. Pat dry the peri-wound skin, maintaining the wound bed moist. Apply a wet gauze with Vashe on the wound bed, filling the dept. Cover with foam dressing, change daily or PRN soiling. If the foam dressing was not saturate, that can be change up to 3 days.  WOC team will not plan to follow further. Please reconsult if further assistance is needed. Thank-you,  Lela Holm RN, CNS, [employer], MSN.  (Phone [phone])

## 2023-08-31 NOTE — H&P (Addendum)
 History and Physical    Lori [facility] FMW:[id number] DOB: [birth year] DOA: 08/31/2023  DOS: the patient was seen and examined on 08/31/2023  PCP: Tobie Domino, MD   Patient coming from: Group Home  I have personally briefly reviewed patient's old medical records in [facility]  Chief Complaint: Seizure  HPI: Lori Petersen [facility] is a pleasant 68 y.o. female with medical history significant for seizure disorder, Down syndrome, dementia, GERD, CHF, HLD, hypothyroidism who was brought in from group home for evaluation of 2 witnessed seizure.  Patient received phenobarbital and Ativan  with cessation of seizure activity.  Patient was brought into the ED with post ictal confusion.  Even after 6 hours patient was confused and not at baseline.  Hospitalist service was consulted for evaluation for admission.  Patient is not able to provide history as she is not able to make meaningful communication at this point.  ED Course: Upon arrival to the ED, patient is found to be reassuring exam, lab test but patient remained confused in the emergency room without knowing the baseline.  Patient is alert awake and tracks but does not speak.  Hospitalist service was consulted for observation admission.  Review of Systems:  ROS  All other systems negative except as noted in the HPI.  Past Medical History:  Diagnosis Date   AKI (acute kidney injury) (HCC)    Arthritis    lower spine   Asthma    Barrett's esophagus    Bradycardia    Constipation    DDD (degenerative disc disease), cervical    Dementia (HCC)    Diastolic congestive heart failure (HCC)    only has problems with bad colds   Down's syndrome    Dysrhythmia    long QT disorder,   Fatty liver    Full dentures    does not wear   GERD (gastroesophageal reflux disease)    barrett's   Glaucoma    History of kidney stones    HLD (hyperlipidemia)    Hypothyroidism    Mixed dementia (HCC)    Nausea & vomiting 03/25/2021   Onychomycosis     OSA (obstructive sleep apnea)    Pneumonia 05/17/2021   Pre-diabetes    Pressure ulcers of skin of multiple topographic sites 2023   Respiratory failure (HCC)    Seizures (HCC)    Sepsis (HCC) 05/17/2021   Speech articulation disorder    Spinal stenosis of lumbar region    Thrombocytopenia (HCC)    UTI (urinary tract infection) 05/17/2021   Vitamin D  deficiency     Past Surgical History:  Procedure Laterality Date   BREAST BIOPSY Left 04/05/2016   neg. cylinder clip   BREAST BIOPSY Left 12/06/2016   ribbon clip. path pending   COLONOSCOPY WITH PROPOFOL  N/A 04/24/2016   Procedure: COLONOSCOPY WITH PROPOFOL ;  Surgeon: Rogelia Copping, MD;  Location: [facility];  Service: Endoscopy;  Laterality: N/A;  Leave patient at 10:25 due to transportation   CYSTOSCOPY WITH RETROGRADE PYELOGRAM, URETEROSCOPY AND STENT PLACEMENT Left 06/25/2021   Procedure: CYSTOSCOPY WITH RETROGRADE PYELOGRAM, URETEROSCOPY AND STENT PLACEMENT;  Surgeon: Twylla Glendia BROCKS, MD;  Location: [facility];  Service: Urology;  Laterality: Left;   CYSTOSCOPY WITH STENT PLACEMENT Left 03/24/2021   Procedure: CYSTOSCOPY WITH STENT PLACEMENT;  Surgeon: Francisca Redell BROCKS, MD;  Location: [facility];  Service: Urology;  Laterality: Left;   CYSTOSCOPY/URETEROSCOPY/HOLMIUM LASER/STENT PLACEMENT Left 06/24/2021   Procedure: CYSTOSCOPY/URETEROSCOPY/HOLMIUM LASER/STENT PLACEMENT;  Surgeon: Francisca Redell BROCKS, MD;  Location:  [facility];  Service: Urology;  Laterality: Left;   CYSTOSCOPY/URETEROSCOPY/HOLMIUM LASER/STENT PLACEMENT Left 07/22/2021   Procedure: CYSTOSCOPY/URETEROSCOPY/HOLMIUM LASER/STENT PLACEMENT;  Surgeon: Francisca Redell BROCKS, MD;  Location: [facility];  Service: Urology;  Laterality: Left;   ESOPHAGOGASTRODUODENOSCOPY (EGD) WITH PROPOFOL  N/A 04/24/2016   Procedure: ESOPHAGOGASTRODUODENOSCOPY (EGD) WITH PROPOFOL ;  Surgeon: Rogelia Copping, MD;  Location: [facility];  Service: Endoscopy;  Laterality: N/A;   HERNIA REPAIR        reports that she has never smoked. She has never used smokeless tobacco. She reports that she does not currently use drugs. She reports that she does not drink alcohol.  No Known Allergies  History reviewed. No pertinent family history.  Prior to Admission medications   Medication Sig Start Date End Date Taking? Authorizing Provider  Acetaminophen  Extra Strength 500 MG TABS Take 1,000 mg by mouth in the morning, at noon, and at bedtime. 08/17/23  Yes [provider]  aspirin  EC 81 MG tablet Take 81 mg by mouth daily.   Yes [provider]  budesonide  (PULMICORT ) 0.25 MG/2ML nebulizer solution Take 0.25 mg by nebulization 2 (two) times daily.   Yes [provider]  COMBIGAN  0.2-0.5 % ophthalmic solution Place 1 drop into both eyes in the morning and at bedtime.   Yes [provider]  albuterol  (VENTOLIN  HFA) 108 (90 Base) MCG/ACT inhaler Inhale 2 puffs into the lungs every 4 (four) hours as needed. 10/13/22   [provider]  Amino Acids-Protein Hydrolys (FEEDING SUPPLEMENT, PRO-STAT SUGAR FREE 64,) LIQD Take 30 mLs by mouth 2 (two) times daily.    [provider]  ascorbic acid  (VITAMIN C) 500 MG tablet Take 500 mg by mouth 2 (two) times daily.    [provider]  DIMETHICONE-ZINC  OXIDE-VIT A-D EX Apply 1 Tube topically 3 (three) times daily. Apply to Gluteal areas during toileting.    [provider]  formoterol  (PERFOROMIST ) 20 MCG/2ML nebulizer solution Take 20 mcg by nebulization 2 (two) times daily.    [provider]  levETIRAcetam  (KEPPRA ) 1000 MG tablet Take 1 tablet (1,000 mg total) by mouth 2 (two) times daily. (Take with 250mg  tablet to equal 750mg  total) 02/21/23 03/23/23  Alexander, Natalie, DO  levothyroxine  (SYNTHROID ) 100 MCG tablet Take 100 mcg by mouth daily before breakfast.    [provider]  midodrine  (PROAMATINE ) 10 MG tablet Take 1 tablet (10 mg total) by mouth 3 (three) times daily with  meals. 02/21/23   Alexander, Natalie, DO  omeprazole (PRILOSEC) 20 MG capsule Take 20 mg by mouth daily.    [provider]  pravastatin  (PRAVACHOL ) 40 MG tablet Take 40 mg by mouth at bedtime.    [provider]  prenatal vitamin w/FE, FA (PRENATAL 1 + 1) 27-1 MG TABS tablet Take 1 tablet by mouth daily.    [provider]  vitamin B-12 (CYANOCOBALAMIN ) 1000 MCG tablet Take 1,000 mcg by mouth daily.    [provider]    Physical Exam: Vitals:   08/31/23 0645 08/31/23 0700 08/31/23 0708 08/31/23 0730  BP:  (!) 121/93    Pulse:      Resp:      Temp:    98.4 F (36.9 C)  TempSrc:    Axillary  SpO2: 100%  96%   Weight:      Height:        Physical Exam   Constitutional: Alert, awake, calm, comfortable HEENT: Neck supple Respiratory: Clear to auscultation B/L, no wheezing, no rales.  Cardiovascular: Regular rate and rhythm, no murmurs / rubs / gallops. No extremity edema. 2+ pedal pulses. No carotid bruits.  Abdomen: Soft, no tenderness, Bowel sounds positive.  Musculoskeletal: no clubbing / cyanosis. Good ROM, no contractures. Normal muscle tone.  Skin: no rashes, lesions, ulcers. Neurologic: CN 2-12 grossly intact. Sensation intact, No focal deficit identified Psychiatric: Alert awake nonverbal tracking and moving extremities  Labs on Admission: I have personally reviewed following labs and imaging studies  CBC: Recent Labs  Lab 08/31/23 0242  WBC 5.9  NEUTROABS 4.1  HGB 11.5*  HCT 35.7*  MCV 105.0*  PLT 226   Basic Metabolic Panel: Recent Labs  Lab 08/31/23 0242  NA 142  K 3.9  CL 107  CO2 26  GLUCOSE 107*  BUN 21  CREATININE 0.94  CALCIUM 8.6*  MG 2.0   GFR: Estimated Creatinine Clearance: 52.5 mL/min (by C-G formula based on SCr of 0.94 mg/dL). Liver Function Tests: Recent Labs  Lab 08/31/23 0242  AST 24  ALT 18  ALKPHOS 43  BILITOT <0.2  PROT 6.7  ALBUMIN  3.2*   No results for input(s): LIPASE, AMYLASE  in the last 168 hours. No results for input(s): AMMONIA in the last 168 hours. Coagulation Profile: No results for input(s): INR, PROTIME in the last 168 hours. Cardiac Enzymes: Recent Labs  Lab 08/31/23 0242 08/31/23 0434  TROPONINIHS 19* 15   BNP (last 3 results) Recent Labs    12/01/22 1553  BNP 16.1   HbA1C: No results for input(s): HGBA1C in the last 72 hours. CBG: No results for input(s): GLUCAP in the last 168 hours. Lipid Profile: No results for input(s): CHOL, HDL, LDLCALC, TRIG, CHOLHDL, LDLDIRECT in the last 72 hours. Thyroid  Function Tests: No results for input(s): TSH, T4TOTAL, FREET4, T3FREE, THYROIDAB in the last 72 hours. Anemia Panel: No results for input(s): VITAMINB12, FOLATE, FERRITIN, TIBC, IRON, RETICCTPCT in the last 72 hours. Urine analysis:    Component Value Date/Time   COLORURINE YELLOW (A) 08/31/2023 0434   APPEARANCEUR CLOUDY (A) 08/31/2023 0434   APPEARANCEUR CLEAR 02/22/2013 2300   LABSPEC 1.016 08/31/2023 0434   LABSPEC 1.013 02/22/2013 2300   PHURINE 7.0 08/31/2023 0434   GLUCOSEU NEGATIVE 08/31/2023 0434   GLUCOSEU NEGATIVE 02/22/2013 2300   HGBUR NEGATIVE 08/31/2023 0434   BILIRUBINUR NEGATIVE 08/31/2023 0434   BILIRUBINUR NEGATIVE 02/22/2013 2300   KETONESUR NEGATIVE 08/31/2023 0434   PROTEINUR NEGATIVE 08/31/2023 0434   NITRITE NEGATIVE 08/31/2023 0434   LEUKOCYTESUR NEGATIVE 08/31/2023 0434   LEUKOCYTESUR NEGATIVE 02/22/2013 2300    Radiological Exams on Admission: I have personally reviewed images CT HEAD WO CONTRAST ( ) Result Date: 08/31/2023 CLINICAL DATA:  breakthrough seizures, prolonged postictal period EXAM: CT HEAD WITHOUT CONTRAST TECHNIQUE: Contiguous axial images were obtained from the base of the skull through the vertex without intravenous contrast. RADIATION DOSE REDUCTION: This exam was performed according to the departmental dose-optimization program which includes  automated exposure control, adjustment of the mA and/or kV according to patient size and/or use of iterative reconstruction technique. COMPARISON:  02/16/2023 FINDINGS: Brain: Normal anatomic configuration. Moderate parenchymal volume loss is stable though advanced given the patient's age. Mild periventricular white matter changes are present likely reflecting the sequela of small vessel ischemia. Prominent mesial temporal atrophy with notable dilation of the temporal horns of the lateral ventricles is stable. No abnormal intra or extra-axial mass lesion or fluid collection. No abnormal mass effect or midline shift. No evidence of acute intracranial hemorrhage or infarct. Ventricular size  is mildly enlarged, commensurate with the degree of parenchymal atrophy and likely reflecting ex vacuo dilation . Cerebellum unremarkable. Vascular: No asymmetric hyperdense vasculature at the skull base. Skull: Intact Sinuses/Orbits: Paranasal sinuses are clear. Orbits are unremarkable. Other: Mastoid air cells and middle ear cavities are clear. IMPRESSION: 1. No acute intracranial abnormality. No calvarial fracture. 2. Stable moderate parenchymal volume loss, advanced given the patient's age. 3. Stable prominent mesial temporal atrophy with notable dilation of the temporal horns of the lateral ventricles. Electronically Signed   By: Dorethia Molt M.D.   On: 08/31/2023 03:32   DG Chest Portable 1 View Result Date: 08/31/2023 CLINICAL DATA:  Seizure EXAM: PORTABLE CHEST 1 VIEW COMPARISON:  02/16/2023 FINDINGS: Lungs volumes are small, but are symmetric and are clear. No pneumothorax or pleural effusion. Cardiac size within normal limits. Pulmonary vascularity is normal. Advanced degenerative changes noted within the shoulders bilaterally. No acute bone abnormality. IMPRESSION: 1. Pulmonary hypoinflation. Electronically Signed   By: Dorethia Molt M.D.   On: 08/31/2023 03:19    EKG: My personal interpretation of EKG shows:  Sinus rhythm at 96 bpm    Assessment/Plan Principal Problem:   Breakthrough seizure (HCC) Active Problems:   Chronic diastolic CHF (congestive heart failure) (HCC)   HLD (hyperlipidemia)   Asthma, mild intermittent   Hypothyroid   Down syndrome    Assessment and Plan: 68 year old old female with history of seizure, Down syndrome, CHF, asthma, hypothyroidism who was brought in from group home for 2 episodes of seizure.  Patient was given Ativan  and phenobarbital with improvement but patient remained to be confused.  1.  Breakthrough seizure - Etiology is unknown, there is no signs of infection, viral panel is negative including COVID - CT brain is negative for acute pathology - Patient's symptoms improved no further episodes of seizure - She was given phenobarbital and Ativan  without any further episodes of seizure - Patient remains to be confused, unknown baseline - She has been placed in observation for prolonged confusion/postictal - Neurologist was discussed by ED provider and they advised to monitor here at [facility], no need for continuous video monitoring  2.  Down syndrome/developmental delay - Supportive care  3.  History of asthma - Will place her on nebulization. -Check COVID/Flu to make sure infection is not the part of this seizure  4.  Hypothyroidism - Continue levothyroxine   5.  Hyperlipidemia - Continue home medications    DVT prophylaxis: Lovenox  Code Status: Full Code Family Communication: None available Disposition Plan: Back to group home Consults called: Neurology from ED Admission status: Observation, Med-Surg   Nena Rebel, MD [facility] 08/31/2023, 11:20 AM

## 2023-08-31 NOTE — Plan of Care (Signed)
  Problem: Education: Goal: Knowledge of General Education information will improve Description: Including pain rating scale, medication(s)/side effects and non-pharmacologic comfort measures Outcome: Progressing   Problem: Health Behavior/Discharge Planning: Goal: Ability to manage health-related needs will improve Outcome: Progressing   Problem: Clinical Measurements: Goal: Ability to maintain clinical measurements within normal limits will improve Outcome: Progressing   Problem: Activity: Goal: Risk for activity intolerance will decrease Outcome: Progressing   Problem: Nutrition: Goal: Adequate nutrition will be maintained Outcome: Progressing   Problem: Coping: Goal: Level of anxiety will decrease Outcome: Progressing   Problem: Elimination: Goal: Will not experience complications related to bowel motility Outcome: Progressing   Problem: Pain Managment: Goal: General experience of comfort will improve and/or be controlled Outcome: Progressing

## 2023-08-31 NOTE — Progress Notes (Signed)
 Upon approach, pt was resting, difficult to arouse, was responding to pain. Pt has small BM, during the process of incontinence care, Patient woke up and is not alert and responding to name and opening eyes spontaneously.  CBG obtain earlier WNL

## 2023-08-31 NOTE — ED Provider Notes (Signed)
 8:58 AM Assumed care for off going team.   Blood pressure (!) 121/93, pulse 88, temperature 98.4 F (36.9 C), temperature source Axillary, resp. rate 16, height 5' (1.524 m), weight 75.1 kg, SpO2 96%.  See their HPI for full report but in brief pending re-eval   I reviewed the hospital discharge note from 02/21/2023 where patient was increased to Keppra  1000 twice daily.  Trops are 19 but downtrending to 15 which I suspect this is demand from seizure.  Her urine is without evidence of any UTI.  Her CMP was reassuring and CBC was reassuring.  I was having difficulty getting a hold of the group home and the legal guardian to figure out what patient's baseline is.  I was told by outgoing provider that she is verbal at baseline.  I did find a neurology note that stated Patient is awake, she fixates and tracks examiner around the room, states yeah and huh in response to some questions. Otherwise no verbal output, does not follow commands   Patient is not that alert at this time she is just staring off.  Although she is spontaneously moving her arms and does seem to be tracking.  I discussed the case with neurology Dr. Matthews who is agreeable with keeping patient here given patient has had a history of prolonged postictal periods in the past.  Will discuss with hospital team for admission          Ernest Ronal BRAVO, MD 08/31/23 1000

## 2023-08-31 NOTE — ED Notes (Signed)
 This RN attempted to call legal guardian dorothy. No voicemail available.

## 2023-08-31 NOTE — ED Notes (Signed)
 This RN fed pt. Pt was able to chew and swallow food. She does not have teeth so food needs to be mashed. 75% of breakfast ate. Pt mouth cleansed.

## 2023-08-31 NOTE — Plan of Care (Signed)
  Problem: Nutrition: Goal: Adequate nutrition will be maintained Outcome: Progressing   Problem: Coping: Goal: Level of anxiety will decrease Outcome: Progressing   Problem: Elimination: Goal: Will not experience complications related to bowel motility Outcome: Progressing   Problem: Elimination: Goal: Will not experience complications related to urinary retention Outcome: Progressing   Problem: Pain Managment: Goal: General experience of comfort will improve and/or be controlled Outcome: Progressing   Problem: Safety: Goal: Ability to remain free from injury will improve Outcome: Progressing   Problem: Education: Goal: Knowledge of General Education information will improve Description: Including pain rating scale, medication(s)/side effects and non-pharmacologic comfort measures Outcome: Not Met (add Reason) unable to orient or educate patient   Problem: Health Behavior/Discharge Planning: Goal: Ability to manage health-related needs will improve Outcome: Not Met (add Reason) unable to orient or educate patient

## 2023-08-31 NOTE — Progress Notes (Signed)
 Received patient from ED via stretcher by techs. Pt is on 2L O2 via [address] [place]. Patient is alert and oriented to self. Unable to orient to room. Call bell in place. No distress noted. Transferred to bed and made comfortable. Seizure pads in place. IV intact.

## 2023-08-31 NOTE — ED Triage Notes (Signed)
 Pt BIB [facility] d/t 2 witnessed seizures - 1 @ 0040 and the other @ 0108.  Pt was given 0.5 Ativan  @ 0110.  At this time she is Postictal  - responsive to pain.    BP 107/82 HR 112 99 % on 2L CBG 219  20g RFA

## 2023-08-31 NOTE — Progress Notes (Addendum)
 Received call from Lori Petersen at [facility] letting us  know that they have legal guardianship of the pt.  Provided her with Case Manager's phone number.  She stated she would be bringing paperwork. Her previous guardian, [name], has passed away.

## 2023-08-31 NOTE — ED Notes (Signed)
 Pt non verbal at baseline, alert to voice, NAD. Warm blanket provided. No further needs expressed.

## 2023-09-01 DIAGNOSIS — L8990 Pressure ulcer of unspecified site, unspecified stage: Secondary | ICD-10-CM | POA: Insufficient documentation

## 2023-09-01 DIAGNOSIS — G40919 Epilepsy, unspecified, intractable, without status epilepticus: Secondary | ICD-10-CM | POA: Diagnosis not present

## 2023-09-01 DIAGNOSIS — E669 Obesity, unspecified: Secondary | ICD-10-CM | POA: Insufficient documentation

## 2023-09-01 LAB — CBC
HCT: 35.7 % — ABNORMAL LOW (ref 36.0–46.0)
Hemoglobin: 11.6 g/dL — ABNORMAL LOW (ref 12.0–15.0)
MCH: 34.3 pg — ABNORMAL HIGH (ref 26.0–34.0)
MCHC: 32.5 g/dL (ref 30.0–36.0)
MCV: 105.6 fL — ABNORMAL HIGH (ref 80.0–100.0)
Platelets: 209 K/uL (ref 150–400)
RBC: 3.38 MIL/uL — ABNORMAL LOW (ref 3.87–5.11)
RDW: 14.3 % (ref 11.5–15.5)
WBC: 7.6 K/uL (ref 4.0–10.5)
nRBC: 0 % (ref 0.0–0.2)

## 2023-09-01 LAB — COMPREHENSIVE METABOLIC PANEL WITH GFR
ALT: 25 U/L (ref 0–44)
AST: 28 U/L (ref 15–41)
Albumin: 3.6 g/dL (ref 3.5–5.0)
Alkaline Phosphatase: 46 U/L (ref 38–126)
Anion gap: 8 (ref 5–15)
BUN: 17 mg/dL (ref 8–23)
CO2: 28 mmol/L (ref 22–32)
Calcium: 8.9 mg/dL (ref 8.9–10.3)
Chloride: 104 mmol/L (ref 98–111)
Creatinine, Ser: 1.06 mg/dL — ABNORMAL HIGH (ref 0.44–1.00)
GFR, Estimated: 58 mL/min — ABNORMAL LOW (ref >60)
Glucose, Bld: 82 mg/dL (ref 70–99)
Potassium: 3.9 mmol/L (ref 3.5–5.1)
Sodium: 140 mmol/L (ref 135–145)
Total Bilirubin: 0.7 mg/dL (ref 0.0–1.2)
Total Protein: 7.1 g/dL (ref 6.5–8.1)

## 2023-09-01 LAB — PROTIME-INR
INR: 1 (ref 0.8–1.2)
Prothrombin Time: 14.2 s (ref 11.4–15.2)

## 2023-09-01 MED ORDER — LEVETIRACETAM 100 MG/ML PO SOLN: 1250.0000 mg | Freq: Two times a day (BID) | ORAL | Status: AC

## 2023-09-01 MED ORDER — PANTOPRAZOLE SODIUM 40 MG PO TBEC: 40.0000 mg | DELAYED_RELEASE_TABLET | Freq: Every day | ORAL | Status: DC

## 2023-09-01 NOTE — Discharge Summary (Signed)
 Lori Petersen [facility] FMW:[id number] DOB: 1955-05-18 DOA: 08/31/2023  PCP: Tobie Domino, MD  Admit date: 08/31/2023 Discharge date: 09/01/2023  Time spent: 35 minutes  Recommendations for Outpatient Follow-up:  Pcp f/u     Discharge Diagnoses:  Principal Problem:   Breakthrough seizure [facility]) Active Problems:   Chronic diastolic CHF (congestive heart failure) (HCC)   HLD (hyperlipidemia)   Asthma, mild intermittent   Hypothyroid   Down syndrome   Dementia (HCC)   Pressure injury of skin   Obesity (BMI 30-39.9)   Discharge Condition: stable  Diet recommendation: prior diet  Filed Weights   08/31/23 0227  Weight: 75.1 kg    History of present illness:  From admission h and p Lori Petersen is a pleasant 68 y.o. female with medical history significant for seizure disorder, Down syndrome, dementia, GERD, CHF, HLD, hypothyroidism who was brought in from group home for evaluation of 2 witnessed seizure.  Patient received phenobarbital and Ativan  with cessation of seizure activity.  Patient was brought into the ED with post ictal confusion.  Even after 6 hours patient was confused and not at baseline.  Hospitalist service was consulted for evaluation for admission.  Patient is not able to provide history as she is not able to make meaningful communication at this point.   Hospital Course:  Hx advanced dementia, down syndrome, seizure disorder, bedbound status, resides at snf, minimally interactive at baseline, presenting after a seizure. No infectious or other clear trigger identified. Neurology evaluated, advises increasing keppra  to 1250 bid from 1000 bid. Appears to be more or less back to baseline. D/c plan reviewed with [facility] on-call provider.   Procedures: none   Consultations: neurology  Discharge Exam: Vitals:   09/01/23 1000 09/01/23 1200  BP: (!) 91/57   Pulse: (!) 43   Resp: 19   Temp: 97.8 F (36.6 C)   SpO2: 100% 98%    General: NAD, chronically ill  appearing Cardiovascular: rrr Respiratory: rales at bases otherwise clear Neuro: tracks, smiles, grunts a response, moving all 4  Discharge Instructions   Discharge Instructions     Diet - low sodium heart healthy   Complete by: As directed    Discharge wound care:   Complete by: As directed    Cleanse with Vashe #[id number]. Pat dry the peri-wound skin, maintaining the wound bed moist. Apply a wet gauze with Vashe on the wound bed, filling the dept. Cover with foam dressing, change daily or PRN soiling. If the foam dressing was not saturate, that can be change up to 3 days   Increase activity slowly   Complete by: As directed       Allergies as of 09/01/2023   No Known Allergies      Medication List     TAKE these medications    A+D Diaper Rash Crea Apply 1 Application topically in the morning, at noon, and at bedtime.   Acetaminophen  Extra Strength 500 MG Tabs Take 1,000 mg by mouth in the morning, at noon, and at bedtime.   albuterol  108 (90 Base) MCG/ACT inhaler Commonly known as: VENTOLIN  HFA Inhale 2 puffs into the lungs every 4 (four) hours as needed.   ascorbic acid  500 MG tablet Commonly known as: VITAMIN C Take 500 mg by mouth 2 (two) times daily.   aspirin  EC 81 MG tablet Take 81 mg by mouth daily.   budesonide  0.25 MG/2ML nebulizer solution Commonly known as: PULMICORT  Take 0.25 mg by nebulization 2 (two) times daily.  Combigan  0.2-0.5 % ophthalmic solution Generic drug: brimonidine -timolol  Place 1 drop into both eyes in the morning and at bedtime.   cyanocobalamin  1000 MCG tablet Commonly known as: VITAMIN B12 Take 1,000 mcg by mouth daily.   DIMETHICONE-ZINC  OXIDE-VIT A-D EX Apply 1 Tube topically 3 (three) times daily. Apply to Gluteal areas during toileting.   feeding supplement (PRO-STAT SUGAR FREE 64) Liqd Take 30 mLs by mouth 2 (two) times daily.   gabapentin 100 MG capsule Commonly known as: NEURONTIN Take 100 mg by mouth at  bedtime.   levETIRAcetam  100 MG/ML solution Commonly known as: KEPPRA  Take 12.5 mLs (1,250 mg total) by mouth 2 (two) times daily. What changed: how much to take   levothyroxine  100 MCG tablet Commonly known as: SYNTHROID  Take 100 mcg by mouth daily before breakfast.   MIDAZOLAM IV Inject 2 mLs as directed daily as needed (seizures lasting longer than 5 minutes). (Midazolam 1mg /ml solution)   midodrine  10 MG tablet Commonly known as: PROAMATINE  Take 1 tablet (10 mg total) by mouth 3 (three) times daily with meals.   omeprazole 20 MG capsule Commonly known as: PRILOSEC Take 20 mg by mouth daily.   Perforomist  20 MCG/2ML nebulizer solution Generic drug: formoterol  Take 20 mcg by nebulization 2 (two) times daily.   pravastatin  40 MG tablet Commonly known as: PRAVACHOL  Take 40 mg by mouth at bedtime.   prenatal vitamin w/FE, FA 27-1 MG Tabs tablet Take 1 tablet by mouth daily.   Zinc  Oxide 13 % Crea Apply topically in the morning, at noon, and at bedtime.               Discharge Care Instructions  (From admission, onward)           Start     Ordered   09/01/23 0000  Discharge wound care:       Comments: Cleanse with Vashe #[id number]. Pat dry the peri-wound skin, maintaining the wound bed moist. Apply a wet gauze with Vashe on the wound bed, filling the dept. Cover with foam dressing, change daily or PRN soiling. If the foam dressing was not saturate, that can be change up to 3 days   09/01/23 1349           No Known Allergies  Follow-up Information     Tobie Domino, MD Follow up.   Specialty: Family Medicine Why: hospital follow up Contact information: [address] [place] [address] [phone]                  The results of significant diagnostics from this hospitalization (including imaging, microbiology, ancillary and laboratory) are listed below for reference.    Significant Diagnostic Studies: CT HEAD WO CONTRAST  ( ) Result Date: 08/31/2023 CLINICAL DATA:  breakthrough seizures, prolonged postictal period EXAM: CT HEAD WITHOUT CONTRAST TECHNIQUE: Contiguous axial images were obtained from the base of the skull through the vertex without intravenous contrast. RADIATION DOSE REDUCTION: This exam was performed according to the departmental dose-optimization program which includes automated exposure control, adjustment of the mA and/or kV according to patient size and/or use of iterative reconstruction technique. COMPARISON:  02/16/2023 FINDINGS: Brain: Normal anatomic configuration. Moderate parenchymal volume loss is stable though advanced given the patient's age. Mild periventricular white matter changes are present likely reflecting the sequela of small vessel ischemia. Prominent mesial temporal atrophy with notable dilation of the temporal horns of the lateral ventricles is stable. No abnormal intra or extra-axial mass lesion or fluid collection. No  abnormal mass effect or midline shift. No evidence of acute intracranial hemorrhage or infarct. Ventricular size is mildly enlarged, commensurate with the degree of parenchymal atrophy and likely reflecting ex vacuo dilation . Cerebellum unremarkable. Vascular: No asymmetric hyperdense vasculature at the skull base. Skull: Intact Sinuses/Orbits: Paranasal sinuses are clear. Orbits are unremarkable. Other: Mastoid air cells and middle ear cavities are clear. IMPRESSION: 1. No acute intracranial abnormality. No calvarial fracture. 2. Stable moderate parenchymal volume loss, advanced given the patient's age. 3. Stable prominent mesial temporal atrophy with notable dilation of the temporal horns of the lateral ventricles. Electronically Signed   By: Dorethia Molt M.D.   On: 08/31/2023 03:32   DG Chest Portable 1 View Result Date: 08/31/2023 CLINICAL DATA:  Seizure EXAM: PORTABLE CHEST 1 VIEW COMPARISON:  02/16/2023 FINDINGS: Lungs volumes are small, but are symmetric and are  clear. No pneumothorax or pleural effusion. Cardiac size within normal limits. Pulmonary vascularity is normal. Advanced degenerative changes noted within the shoulders bilaterally. No acute bone abnormality. IMPRESSION: 1. Pulmonary hypoinflation. Electronically Signed   By: Dorethia Molt M.D.   On: 08/31/2023 03:19    Microbiology: Recent Results (from the past 240 hours)  Resp panel by RT-PCR (RSV, Flu A&B, Covid) Anterior Nasal Swab     Status: None   Collection Time: 08/31/23  9:46 AM   Specimen: Anterior Nasal Swab  Result Value Ref Range Status   SARS Coronavirus 2 by RT PCR NEGATIVE NEGATIVE Final    Comment: (NOTE) SARS-CoV-2 target nucleic acids are NOT DETECTED.  The SARS-CoV-2 RNA is generally detectable in upper respiratory specimens during the acute phase of infection. The lowest concentration of SARS-CoV-2 viral copies this assay can detect is 138 copies/mL. A negative result does not preclude SARS-Cov-2 infection and should not be used as the sole basis for treatment or other patient management decisions. A negative result may occur with  improper specimen collection/handling, submission of specimen other than nasopharyngeal swab, presence of viral mutation(s) within the areas targeted by this assay, and inadequate number of viral copies(<138 copies/mL). A negative result must be combined with clinical observations, patient history, and epidemiological information. The expected result is Negative.  Fact Sheet for Patients:  BloggerCourse.com  Fact Sheet for Healthcare Providers:  SeriousBroker.it  This test is no t yet approved or cleared by the [place]  FDA and  has been authorized for detection and/or diagnosis of SARS-CoV-2 by FDA under an Emergency Use Authorization (EUA). This EUA will remain  in effect (meaning this test can be used) for the duration of the COVID-19 declaration under Section 564(b)(1) of  the Act, 21 U.S.C.section 360bbb-3(b)(1), unless the authorization is terminated  or revoked sooner.       Influenza A by PCR NEGATIVE NEGATIVE Final   Influenza B by PCR NEGATIVE NEGATIVE Final    Comment: (NOTE) The Xpert Xpress SARS-CoV-2/FLU/RSV plus assay is intended as an aid in the diagnosis of influenza from Nasopharyngeal swab specimens and should not be used as a sole basis for treatment. Nasal washings and aspirates are unacceptable for Xpert Xpress SARS-CoV-2/FLU/RSV testing.  Fact Sheet for Patients: BloggerCourse.com  Fact Sheet for Healthcare Providers: SeriousBroker.it  This test is not yet approved or cleared by the [place]  FDA and has been authorized for detection and/or diagnosis of SARS-CoV-2 by FDA under an Emergency Use Authorization (EUA). This EUA will remain in effect (meaning this test can be used) for the duration of the COVID-19 declaration under Section 564(b)(1) of the  Act, 21 U.S.C. section 360bbb-3(b)(1), unless the authorization is terminated or revoked.     Resp Syncytial Virus by PCR NEGATIVE NEGATIVE Final    Comment: (NOTE) Fact Sheet for Patients: BloggerCourse.com  Fact Sheet for Healthcare Providers: SeriousBroker.it  This test is not yet approved or cleared by the [place]  FDA and has been authorized for detection and/or diagnosis of SARS-CoV-2 by FDA under an Emergency Use Authorization (EUA). This EUA will remain in effect (meaning this test can be used) for the duration of the COVID-19 declaration under Section 564(b)(1) of the Act, 21 U.S.C. section 360bbb-3(b)(1), unless the authorization is terminated or revoked.  Performed at [facility], [address] [place], [place] [address]   MRSA Next Gen by PCR, Nasal     Status: None   Collection Time: 08/31/23  4:32 PM   Specimen: Nasal Mucosa; Nasal Swab   Result Value Ref Range Status   MRSA by PCR Next Gen NOT DETECTED NOT DETECTED Final    Comment: (NOTE) The GeneXpert MRSA Assay (FDA approved for NASAL specimens only), is one component of a comprehensive MRSA colonization surveillance program. It is not intended to diagnose MRSA infection nor to guide or monitor treatment for MRSA infections. Test performance is not FDA approved in patients less than 23 years old. Performed at [facility], [address] [place], [place] [address]      Labs: Basic Metabolic Panel: Recent Labs  Lab 08/31/23 0242 09/01/23 0554  NA 142 140  K 3.9 3.9  CL 107 104  CO2 26 28  GLUCOSE 107* 82  BUN 21 17  CREATININE 0.94 1.06*  CALCIUM 8.6* 8.9  MG 2.0  --    Liver Function Tests: Recent Labs  Lab 08/31/23 0242 09/01/23 0554  AST 24 28  ALT 18 25  ALKPHOS 43 46  BILITOT <0.2 0.7  PROT 6.7 7.1  ALBUMIN  3.2* 3.6   No results for input(s): LIPASE, AMYLASE in the last 168 hours. No results for input(s): AMMONIA in the last 168 hours. CBC: Recent Labs  Lab 08/31/23 0242 09/01/23 0554  WBC 5.9 7.6  NEUTROABS 4.1  --   HGB 11.5* 11.6*  HCT 35.7* 35.7*  MCV 105.0* 105.6*  PLT 226 209   Cardiac Enzymes: No results for input(s): CKTOTAL, CKMB, CKMBINDEX, TROPONINI in the last 168 hours. BNP: BNP (last 3 results) Recent Labs    12/01/22 1553  BNP 16.1    ProBNP (last 3 results) No results for input(s): PROBNP in the last 8760 hours.  CBG: Recent Labs  Lab 08/31/23 2027  GLUCAP 99       Signed:  Devaughn KATHEE Ban MD.  [facility] 09/01/2023, 1:50 PM

## 2023-09-01 NOTE — Progress Notes (Signed)
 MEWS Progress Note  Patient Details Name: Lori Petersen [facility] MRN: [id number] DOB: 07/16/1955 Today's Date: 09/01/2023   MEWS Flowsheet Documentation:  Assess: MEWS Score Temp: 98.6 F (37 C) BP: 97/69 MAP (mmHg): 78 Pulse Rate: (!) 46 ECG Heart Rate: (!) 54 Resp: 14 Level of Consciousness: Responds to Pain SpO2: 100 % O2 Device: Nasal Cannula Assess: MEWS Score MEWS Temp: 0 MEWS Systolic: 1 MEWS Pulse: 0 MEWS RR: 0 MEWS LOC: 2 MEWS Score: 3 MEWS Score Color: Yellow Assess: SIRS CRITERIA SIRS Temperature : 0 SIRS Respirations : 0 SIRS Pulse: 0 SIRS WBC: 0 SIRS Score Sum : 0 SIRS Temperature : 0 SIRS Pulse: 0 SIRS Respirations : 0 SIRS WBC: 0 SIRS Score Sum : 0 Assess: if the MEWS score is Yellow or Red Were vital signs accurate and taken at a resting state?: Yes Does the patient meet 2 or more of the SIRS criteria?: No MEWS guidelines implemented : Yes, yellow Treat MEWS Interventions: Considered administering scheduled or prn medications/treatments as ordered Take Vital Signs Increase Vital Sign Frequency : Yellow: Q2hr x1, continue Q4hrs until patient remains green for 12hrs Escalate MEWS: Escalate: Yellow: Discuss with charge nurse and consider notifying provider and/or RRT        Harlie KATHEE Blanch 09/01/2023, 1:40 AM

## 2023-09-01 NOTE — Plan of Care (Signed)
 Pt remained remained significantly drowsy throughout the shift. Being and staying awake for brief period after physical stimuli. Pt did manage to take all of her oral medication as ordered but went back to sleeping right after. Of note patient is yellow MEWs due to lower HR in high 40s to low 50s, SBP soft but appropriate, and LOC fluctuating due to drowsiness.    Problem: Education: Goal: Knowledge of General Education information will improve Description: Including pain rating scale, medication(s)/side effects and non-pharmacologic comfort measures Outcome: Progressing   Problem: Medication: Goal: Risk for medication side effects will decrease Outcome: Progressing   Problem: Clinical Measurements: Goal: Complications related to the disease process, condition or treatment will be avoided or minimized Outcome: Progressing   Problem: Safety: Goal: Verbalization of understanding the information provided will improve Outcome: Progressing

## 2023-09-01 NOTE — TOC Transition Note (Signed)
 Transition of Care [facility]) - Discharge Note   Patient Details  Name: Lori Petersen MRN: [id number] Date of Birth: 25-Apr-1955  Transition of Care [facility]) CM/SW Contact:  Lori L Chevie Birkhead, LCSW Phone Number: 09/01/2023, 2:39 PM   Clinical Narrative:     CSW contacted [facility] and spoke with Lori Petersen. Lori Petersen confirmed that patient can discharge back to [facility]. [facility] contacted. Lori Petersen, [facility], advised of discharge. Discharge Summary entered.   Medical team advised of transport. No further TOC needs identified. TOC signing off.   Final next level of care: Skilled Nursing Facility Barriers to Discharge: No Barriers Identified   Patient Goals and CMS Choice     Choice offered to / list presented to : Patient [place] ownership interest in [facility].provided to:: Patient    Discharge Placement                Patient to be transferred to facility by: [facility] Name of family member notified: Lori Petersen (DSS Guardian) Patient and family notified of of transfer: 09/01/23  Discharge Plan and Services Additional resources added to the After Visit Summary for                                       Social Drivers of Health (SDOH) Interventions SDOH Screenings   Food Insecurity: Patient Unable To Answer (08/31/2023)  Housing: Patient Unable To Answer (08/31/2023)  Transportation Needs: Patient Unable To Answer (08/31/2023)  Utilities: Patient Unable To Answer (08/31/2023)  Social Connections: Patient Unable To Answer (08/31/2023)  Tobacco Use: Low Risk  (08/31/2023)     Readmission Risk Interventions    06/29/2021   11:13 AM 06/27/2021   11:00 AM  Readmission Risk Prevention Plan  Transportation Screening Complete Complete  PCP or Specialist Appt within 3-5 Days Complete   HRI or Home Care Consult Complete   Social Work Consult for Recovery Care Planning/Counseling Complete   Palliative Care Screening Complete   Medication Review [employer])  Complete Complete  PCP or Specialist appointment within 3-5 days of discharge  Complete  HRI or Home Care Consult  Complete  SW Recovery Care/Counseling Consult  Not Complete  SW Consult Not Complete Comments  NA  Palliative Care Screening  Complete  Skilled Nursing Facility  Complete

## 2023-09-01 NOTE — Plan of Care (Signed)
 Recommend patient be admitted for prolonged post-ictal period. On exam she appears rto be near baseline and able to say hello and tell me her name. No need to transfer for cEEG. Will perform formal consult tomorrow. In the meantime increase keppra  to 1250mg  bid.  Elida Ross, MD [facility] [phone]  If 7pm- 7am, please page neurology on call as listed in AMION.
